# Patient Record
Sex: Female | Born: 1965 | Race: White | Hispanic: No | Marital: Single | Smoking: Former smoker
Health system: Southern US, Community
[De-identification: ages and names within clinical notes are randomized; demographics above are authoritative.]

## PROBLEM LIST (undated history)

## (undated) DIAGNOSIS — I4891 Unspecified atrial fibrillation: Secondary | ICD-10-CM

## (undated) DIAGNOSIS — J45909 Unspecified asthma, uncomplicated: Secondary | ICD-10-CM

## (undated) DIAGNOSIS — R600 Localized edema: Secondary | ICD-10-CM

## (undated) DIAGNOSIS — J42 Unspecified chronic bronchitis: Secondary | ICD-10-CM

## (undated) DIAGNOSIS — M069 Rheumatoid arthritis, unspecified: Secondary | ICD-10-CM

## (undated) DIAGNOSIS — I2510 Atherosclerotic heart disease of native coronary artery without angina pectoris: Secondary | ICD-10-CM

## (undated) DIAGNOSIS — J449 Chronic obstructive pulmonary disease, unspecified: Secondary | ICD-10-CM

## (undated) DIAGNOSIS — M1990 Unspecified osteoarthritis, unspecified site: Secondary | ICD-10-CM

## (undated) DIAGNOSIS — Z8719 Personal history of other diseases of the digestive system: Secondary | ICD-10-CM

## (undated) DIAGNOSIS — N83209 Unspecified ovarian cyst, unspecified side: Secondary | ICD-10-CM

## (undated) DIAGNOSIS — J189 Pneumonia, unspecified organism: Secondary | ICD-10-CM

## (undated) HISTORY — DX: Unspecified ovarian cyst, unspecified side: N83.209

## (undated) HISTORY — DX: Atherosclerotic heart disease of native coronary artery without angina pectoris: I25.10

## (undated) HISTORY — DX: Rheumatoid arthritis, unspecified: M06.9

## (undated) HISTORY — PX: TONSILLECTOMY: SUR1361

## (undated) HISTORY — PX: TUBAL LIGATION: SHX77

---

## 2001-05-29 ENCOUNTER — Emergency Department (HOSPITAL_COMMUNITY): Admission: EM | Admit: 2001-05-29 | Discharge: 2001-05-29 | Payer: Self-pay | Admitting: *Deleted

## 2001-08-02 ENCOUNTER — Emergency Department (HOSPITAL_COMMUNITY): Admission: EM | Admit: 2001-08-02 | Discharge: 2001-08-03 | Payer: Self-pay | Admitting: Emergency Medicine

## 2001-08-04 ENCOUNTER — Emergency Department (HOSPITAL_COMMUNITY): Admission: EM | Admit: 2001-08-04 | Discharge: 2001-08-04 | Payer: Self-pay | Admitting: *Deleted

## 2002-02-14 ENCOUNTER — Emergency Department (HOSPITAL_COMMUNITY): Admission: EM | Admit: 2002-02-14 | Discharge: 2002-02-14 | Payer: Self-pay | Admitting: Emergency Medicine

## 2002-02-14 ENCOUNTER — Encounter: Payer: Self-pay | Admitting: Emergency Medicine

## 2002-07-08 ENCOUNTER — Emergency Department (HOSPITAL_COMMUNITY): Admission: EM | Admit: 2002-07-08 | Discharge: 2002-07-08 | Payer: Self-pay | Admitting: Internal Medicine

## 2004-12-08 ENCOUNTER — Emergency Department (HOSPITAL_COMMUNITY): Admission: EM | Admit: 2004-12-08 | Discharge: 2004-12-08 | Payer: Self-pay | Admitting: Emergency Medicine

## 2004-12-10 ENCOUNTER — Ambulatory Visit (HOSPITAL_COMMUNITY): Admission: RE | Admit: 2004-12-10 | Discharge: 2004-12-10 | Payer: Self-pay | Admitting: Emergency Medicine

## 2010-01-18 ENCOUNTER — Emergency Department (HOSPITAL_COMMUNITY): Admission: EM | Admit: 2010-01-18 | Discharge: 2010-01-18 | Payer: Self-pay | Admitting: Emergency Medicine

## 2010-01-30 ENCOUNTER — Ambulatory Visit (HOSPITAL_COMMUNITY): Admission: RE | Admit: 2010-01-30 | Discharge: 2010-01-30 | Payer: Self-pay | Admitting: Family Medicine

## 2010-07-30 LAB — DIFFERENTIAL
Basophils Absolute: 0.1 K/uL (ref 0.0–0.1)
Basophils Relative: 1 % (ref 0–1)
Eosinophils Absolute: 0.2 10*3/uL (ref 0.0–0.7)
Eosinophils Relative: 2 % (ref 0–5)
Lymphocytes Relative: 31 % (ref 12–46)
Lymphs Abs: 4.6 K/uL — ABNORMAL HIGH (ref 0.7–4.0)
Monocytes Absolute: 1.1 10*3/uL — ABNORMAL HIGH (ref 0.1–1.0)
Monocytes Relative: 8 % (ref 3–12)
Neutro Abs: 8.7 K/uL — ABNORMAL HIGH (ref 1.7–7.7)
Neutrophils Relative %: 59 % (ref 43–77)

## 2010-07-30 LAB — CBC
HCT: 37.3 % (ref 36.0–46.0)
Hemoglobin: 12.3 g/dL (ref 12.0–15.0)
MCH: 27.9 pg (ref 26.0–34.0)
MCHC: 33 g/dL (ref 30.0–36.0)
MCV: 84.7 fL (ref 78.0–100.0)
Platelets: 302 K/uL (ref 150–400)
RBC: 4.41 MIL/uL (ref 3.87–5.11)
RDW: 13.9 % (ref 11.5–15.5)
WBC: 14.8 K/uL — ABNORMAL HIGH (ref 4.0–10.5)

## 2010-07-30 LAB — POCT CARDIAC MARKERS
CKMB, poc: <1 ng/mL — ABNORMAL LOW (ref 1.0–8.0)
Myoglobin, poc: 43.2 ng/mL (ref 12–200)
Troponin i, poc: <0.05 ng/mL (ref 0.00–0.09)

## 2010-07-30 LAB — BASIC METABOLIC PANEL
BUN: 8 mg/dL (ref 6–23)
CO2: 26 mEq/L (ref 19–32)
Calcium: 9.1 mg/dL (ref 8.4–10.5)
Chloride: 105 mEq/L (ref 96–112)
Creatinine, Ser: 0.67 mg/dL (ref 0.4–1.2)
GFR calc Af Amer: >60 mL/min (ref >60)
GFR calc non Af Amer: >60 mL/min (ref >60)
Glucose, Bld: 120 mg/dL — ABNORMAL HIGH (ref 70–99)
Potassium: 3.3 mEq/L — ABNORMAL LOW (ref 3.5–5.1)
Sodium: 138 mEq/L (ref 135–145)

## 2012-06-12 ENCOUNTER — Other Ambulatory Visit (HOSPITAL_COMMUNITY): Payer: Self-pay | Admitting: Family Medicine

## 2012-06-12 DIAGNOSIS — Z139 Encounter for screening, unspecified: Secondary | ICD-10-CM

## 2012-06-15 ENCOUNTER — Ambulatory Visit (HOSPITAL_COMMUNITY): Payer: Self-pay

## 2012-06-26 ENCOUNTER — Ambulatory Visit (HOSPITAL_COMMUNITY)
Admission: RE | Admit: 2012-06-26 | Discharge: 2012-06-26 | Disposition: A | Discharge status: Home / Self Care | Payer: BC Managed Care – PPO | Source: Ambulatory Visit | Attending: Family Medicine | Admitting: Family Medicine

## 2012-06-26 DIAGNOSIS — Z1231 Encounter for screening mammogram for malignant neoplasm of breast: Secondary | ICD-10-CM | POA: Insufficient documentation

## 2012-06-26 DIAGNOSIS — Z139 Encounter for screening, unspecified: Secondary | ICD-10-CM

## 2013-03-28 ENCOUNTER — Emergency Department (HOSPITAL_COMMUNITY)
Admission: EM | Admit: 2013-03-28 | Discharge: 2013-03-29 | Disposition: A | Discharge status: Home / Self Care | Payer: BC Managed Care – PPO | Attending: Emergency Medicine | Admitting: Emergency Medicine

## 2013-03-28 ENCOUNTER — Encounter (HOSPITAL_COMMUNITY): Payer: Self-pay | Admitting: Emergency Medicine

## 2013-03-28 ENCOUNTER — Emergency Department (HOSPITAL_COMMUNITY): Payer: BC Managed Care – PPO

## 2013-03-28 DIAGNOSIS — F17200 Nicotine dependence, unspecified, uncomplicated: Secondary | ICD-10-CM | POA: Insufficient documentation

## 2013-03-28 DIAGNOSIS — M25569 Pain in unspecified knee: Secondary | ICD-10-CM | POA: Insufficient documentation

## 2013-03-28 DIAGNOSIS — M25561 Pain in right knee: Secondary | ICD-10-CM

## 2013-03-28 DIAGNOSIS — Z872 Personal history of diseases of the skin and subcutaneous tissue: Secondary | ICD-10-CM | POA: Insufficient documentation

## 2013-03-28 DIAGNOSIS — Z79899 Other long term (current) drug therapy: Secondary | ICD-10-CM | POA: Insufficient documentation

## 2013-03-28 HISTORY — DX: Localized edema: R60.0

## 2013-03-28 MED ORDER — NAPROXEN 250 MG PO TABS
500.0000 mg | ORAL_TABLET | Freq: Once | ORAL | Status: AC
  Administered 2013-03-28: 500 mg via ORAL
  Filled 2013-03-28: qty 2

## 2013-03-28 MED ORDER — OXYCODONE-ACETAMINOPHEN 5-325 MG PO TABS
2.0000 | ORAL_TABLET | Freq: Once | ORAL | Status: AC
  Administered 2013-03-28: 2 via ORAL
  Filled 2013-03-28: qty 2

## 2013-03-28 NOTE — ED Provider Notes (Addendum)
CSN: [id number]     Arrival date & time 03/28/13  2327 History  This chart was scribed for Vida Roller, MD by Blanchard Kelch, ED Scribe. The patient was seen in room APA02/APA02. Patient's care was started at 11:35 PM.    Chief Complaint  Patient presents with  . Knee Pain   The history is provided by the patient. No language interpreter was used.    HPI Comments: Kristine Montgomery is a 47 y.o. female who presents to the Emergency Department complaining of constant right knee pain that began a few days ago, but worsened today. She states that she was walking at work three days ago and heard a "pop" in her knee but denies any immediate pain to the area at that time. Since then she has experienced a popcorn like sensation with ambulation. She describes the pain as a "pulling sensation." The pain is worsened with ambulation. She has taken Aleve for the pain without relief. She denies any ankle, foot, shoulder, elbow, wrist, neck, left knee pain or any other arthragias. She also reports baseline leg swelling, worse on the right. She has never had it checked to determine the cause of the swelling. She is currently taking Lasix for it. She has had prior pain in her right leg about a month ago, but states this pain is different from the pain she is currently experiencing. She was seen by Dr. Regino Schultze for it who told her it may be due to irritation of her sciatic nerve.    Past Medical History  Diagnosis Date  . Edema extremities    History reviewed. No pertinent past surgical history. No family history on file. History  Substance Use Topics  . Smoking status: Current Every Day Smoker  . Smokeless tobacco: Not on file  . Alcohol Use: No   OB History   Grav Para Term Preterm Abortions TAB SAB Ect Mult Living                 Review of Systems  All other systems reviewed and are negative.    Allergies  Review of patient's allergies indicates no known allergies.  Home Medications    Current Outpatient Rx  Name  Route  Sig  Dispense  Refill  . furosemide (LASIX) 20 MG tablet   Oral   Take 20 mg by mouth 2 (two) times daily as needed.         . naproxen (NAPROSYN) 500 MG tablet   Oral   Take 1 tablet (500 mg total) by mouth 2 (two) times daily with a meal.   30 tablet   0   . oxyCODONE-acetaminophen (PERCOCET) 5-325 MG per tablet   Oral   Take 1 tablet by mouth every 4 (four) hours as needed.   20 tablet   0    Triage Vitals: BP 169/84  Pulse 91  Temp(Src) 98.3 F (36.8 C) (Oral)  Resp 22  Ht 5\' 1"  (1.549 m)  Wt 294 lb (133.358 kg)  BMI 55.58 kg/m2  SpO2 100%  LMP 03/08/2013  Physical Exam  Nursing note and vitals reviewed. Constitutional: She is oriented to person, place, and time. She appears well-developed and well-nourished. No distress.  HENT:  Head: Normocephalic and atraumatic.  Eyes: EOM are normal.  Neck: Neck supple.  Cardiovascular: Normal rate and regular rhythm.   No murmur heard. Pulmonary/Chest: Effort normal and breath sounds normal. No respiratory distress.  Musculoskeletal: Normal range of motion. She exhibits edema and tenderness.  Focal tenderness to right knee medial joint line. No crepitance. Significant decreased range of motion secondary to pain. No redness warmth or rash on right knee. Edema and swelling of right leg compared to left below the knee.  Neurological: She is alert and oriented to person, place, and time.  Skin: Skin is warm and dry.  Psychiatric: She has a normal mood and affect. Her behavior is normal.    ED Course  Procedures (including critical care time)  DIAGNOSTIC STUDIES: Oxygen Saturation is 100% on room air, normal by my interpretation.    COORDINATION OF CARE: 11:42 PM -Will order right knee x-ray, Percocet and Naprosyn. Patient verbalizes understanding and agrees with treatment plan.    Labs Review Labs Reviewed - No data to display Imaging Review No results found.  EKG  Interpretation   None       MDM   1. Right knee pain    Pt has no effusion on xray or on clinical exam, there is no warmth or redness to joint to suggest that it is a septic arthritis and the pain is medial joint line tenderness - this would be most c/w a torn meniscus since she has a popping sensation when she walks and bends her knee - RICE therapy, knee imob and pain meds with ortho f/u.  Scheduled for [place] in the AM due to asymetric swelling of the LE's  Meds given in ED:  Medications  oxyCODONE-acetaminophen (PERCOCET/ROXICET) 5-325 MG per tablet 2 tablet (2 tablets Oral Given 03/28/13 2354)  naproxen (NAPROSYN) tablet 500 mg (500 mg Oral Given 03/28/13 2354)    New Prescriptions   NAPROXEN (NAPROSYN) 500 MG TABLET    Take 1 tablet (500 mg total) by mouth 2 (two) times daily with a meal.   OXYCODONE-ACETAMINOPHEN (PERCOCET) 5-325 MG PER TABLET    Take 1 tablet by mouth every 4 (four) hours as needed.      I personally performed the services described in this documentation, which was scribed in my presence. The recorded information has been reviewed and is accurate.      Vida Roller, MD 03/29/13 0021  Vida Roller, MD 03/29/13 [phone]

## 2013-03-28 NOTE — ED Notes (Signed)
Worsening pain over past couple of days, was on her feet all day today and pain 10/10 now

## 2013-03-29 ENCOUNTER — Other Ambulatory Visit (HOSPITAL_COMMUNITY): Payer: BC Managed Care – PPO | Admitting: Emergency Medicine

## 2013-03-29 ENCOUNTER — Ambulatory Visit (HOSPITAL_COMMUNITY)
Admit: 2013-03-29 | Discharge: 2013-03-29 | Disposition: A | Discharge status: Home / Self Care | Payer: BC Managed Care – PPO | Attending: Emergency Medicine | Admitting: Emergency Medicine

## 2013-03-29 DIAGNOSIS — M79604 Pain in right leg: Secondary | ICD-10-CM

## 2013-03-29 DIAGNOSIS — M79609 Pain in unspecified limb: Secondary | ICD-10-CM | POA: Insufficient documentation

## 2013-03-29 MED ORDER — NAPROXEN 500 MG PO TABS: 500.0000 mg | ORAL_TABLET | Freq: Two times a day (BID) | ORAL | Status: DC

## 2013-03-29 MED ORDER — OXYCODONE-ACETAMINOPHEN 5-325 MG PO TABS
ORAL_TABLET | ORAL | Status: AC
  Filled 2013-03-29: qty 1

## 2013-03-29 MED ORDER — OXYCODONE-ACETAMINOPHEN 5-325 MG PO TABS: 1.0000 | ORAL_TABLET | ORAL | Status: DC | PRN

## 2013-03-29 NOTE — ED Notes (Signed)
Weight  296.8 lbs   Weight limit for crutches is 300 lbs

## 2013-03-29 NOTE — ED Notes (Signed)
Ultrasound scheduled for 03/29/2013  At 15:00   Patient advised to return at 14:45 to check in for ultrasound

## 2013-05-10 ENCOUNTER — Encounter (HOSPITAL_COMMUNITY): Payer: Self-pay | Admitting: Emergency Medicine

## 2013-05-10 ENCOUNTER — Emergency Department (HOSPITAL_COMMUNITY)
Admission: EM | Admit: 2013-05-10 | Discharge: 2013-05-10 | Disposition: A | Discharge status: Home / Self Care | Payer: BC Managed Care – PPO | Attending: Emergency Medicine | Admitting: Emergency Medicine

## 2013-05-10 DIAGNOSIS — J40 Bronchitis, not specified as acute or chronic: Secondary | ICD-10-CM

## 2013-05-10 DIAGNOSIS — J209 Acute bronchitis, unspecified: Secondary | ICD-10-CM | POA: Insufficient documentation

## 2013-05-10 DIAGNOSIS — F17200 Nicotine dependence, unspecified, uncomplicated: Secondary | ICD-10-CM | POA: Insufficient documentation

## 2013-05-10 DIAGNOSIS — J029 Acute pharyngitis, unspecified: Secondary | ICD-10-CM | POA: Insufficient documentation

## 2013-05-10 DIAGNOSIS — Z791 Long term (current) use of non-steroidal anti-inflammatories (NSAID): Secondary | ICD-10-CM | POA: Insufficient documentation

## 2013-05-10 DIAGNOSIS — IMO0001 Reserved for inherently not codable concepts without codable children: Secondary | ICD-10-CM | POA: Insufficient documentation

## 2013-05-10 MED ORDER — ALBUTEROL SULFATE HFA 108 (90 BASE) MCG/ACT IN AERS
2.0000 | INHALATION_SPRAY | RESPIRATORY_TRACT | Status: DC | PRN
  Administered 2013-05-10: 2 via RESPIRATORY_TRACT

## 2013-05-10 MED ORDER — GUAIFENESIN-CODEINE 100-10 MG/5ML PO SYRP: 5.0000 mL | ORAL_SOLUTION | Freq: Three times a day (TID) | ORAL | Status: DC | PRN

## 2013-05-10 MED ORDER — HYDROCOD POLST-CHLORPHEN POLST 10-8 MG/5ML PO LQCR
5.0000 mL | Freq: Once | ORAL | Status: AC
  Administered 2013-05-10: 5 mL via ORAL
  Filled 2013-05-10: qty 5

## 2013-05-10 MED ORDER — AZITHROMYCIN 250 MG PO TABS: ORAL_TABLET | ORAL | Status: DC

## 2013-05-10 MED ORDER — ALBUTEROL SULFATE HFA 108 (90 BASE) MCG/ACT IN AERS
INHALATION_SPRAY | RESPIRATORY_TRACT | Status: AC
  Filled 2013-05-10: qty 6.7

## 2013-05-10 MED ORDER — AZITHROMYCIN 250 MG PO TABS
500.0000 mg | ORAL_TABLET | Freq: Once | ORAL | Status: AC
  Administered 2013-05-10: 500 mg via ORAL
  Filled 2013-05-10: qty 2

## 2013-05-10 NOTE — ED Provider Notes (Signed)
CSN: [id number]     Arrival date & time 05/10/13  1650 History   First MD Initiated Contact with Patient 05/10/13 1756     Chief Complaint  Patient presents with  . Cough   (Consider location/radiation/quality/duration/timing/severity/associated sxs/prior Treatment) Patient is a 47 y.o. female presenting with cough. The history is provided by the patient.  Cough Cough characteristics:  Productive Sputum characteristics:  Clear Severity:  Moderate Onset quality:  Gradual Duration:  2 days Timing:  Sporadic Progression:  Worsening Chronicity:  New Smoker: yes   Context: upper respiratory infection   Relieved by:  Nothing Worsened by:  Lying down and smoking Ineffective treatments:  Cough suppressants and decongestant Associated symptoms: myalgias, rhinorrhea, shortness of breath, sinus congestion, sore throat and wheezing   Associated symptoms: no chest pain, no chills, no ear fullness, no ear pain, no eye discharge, no fever, no headaches and no rash    NIKKOLE PLACZEK is a 47 y.o. female who presents to the ED with cough and congestion, wheezing and URI symptoms that started 2 days ago. She has been taking OTC medications without relief. She is an every day smoker.   Past Medical History  Diagnosis Date  . Edema extremities    Past Surgical History  Procedure Laterality Date  . Cesarean section    . Tonsillectomy     History reviewed. No pertinent family history. History  Substance Use Topics  . Smoking status: Current Every Day Smoker  . Smokeless tobacco: Not on file  . Alcohol Use: No   OB History   Grav Para Term Preterm Abortions TAB SAB Ect Mult Living                 Review of Systems  Constitutional: Negative for fever and chills.  HENT: Positive for rhinorrhea and sore throat. Negative for ear pain.   Eyes: Negative for discharge.  Respiratory: Positive for cough, shortness of breath and wheezing.   Cardiovascular: Negative for chest pain.   Gastrointestinal: Negative for nausea, vomiting and abdominal pain.  Musculoskeletal: Positive for myalgias.  Skin: Negative for rash.  Neurological: Negative for headaches.    Allergies  Review of patient's allergies indicates no known allergies.  Home Medications   Current Outpatient Rx  Name  Route  Sig  Dispense  Refill  . furosemide (LASIX) 20 MG tablet   Oral   Take 20 mg by mouth 2 (two) times daily as needed.         . naproxen (NAPROSYN) 500 MG tablet   Oral   Take 1 tablet (500 mg total) by mouth 2 (two) times daily with a meal.   30 tablet   0   . oxyCODONE-acetaminophen (PERCOCET) 5-325 MG per tablet   Oral   Take 1 tablet by mouth every 4 (four) hours as needed.   20 tablet   0    BP 147/74  Pulse 92  Temp(Src) 97.9 F (36.6 C) (Oral)  Resp 20  Ht 5\' 1"  (1.549 m)  Wt 294 lb (133.358 kg)  BMI 55.58 kg/m2  SpO2 97%  LMP 04/30/2013 Physical Exam  Nursing note and vitals reviewed. Constitutional: She is oriented to person, place, and time. She appears well-developed and well-nourished.  HENT:  Head: Normocephalic and atraumatic.  Right Ear: Tympanic membrane normal.  Left Ear: Tympanic membrane normal.  Nose: Mucosal edema and rhinorrhea present.  Mouth/Throat: Uvula is midline and mucous membranes are normal. Posterior oropharyngeal erythema present.  Eyes: EOM are normal.  Neck: Neck supple.  Cardiovascular: Normal rate and regular rhythm.   Pulmonary/Chest: Effort normal. She has wheezes.  Abdominal: Soft. Bowel sounds are normal. There is no tenderness.  Musculoskeletal: Normal range of motion.  Neurological: She is alert and oriented to person, place, and time. No cranial nerve deficit.  Skin: Skin is warm and dry.  Psychiatric: She has a normal mood and affect. Her behavior is normal.    ED Course  Procedures   MDM  47 y.o. female with bronchitis and bronchospasm. Will treat with antibiotics and cough medication and Albuterol inhaler.  Encouraged patient to stop smoking. Discussed with the patient clinical findings and plan of care. All questioned fully answered. She voices understanding. She will return if any problems arise. Stable for discharge. BP 147/74  Pulse 92  Temp(Src) 97.9 F (36.6 C) (Oral)  Resp 20  Ht 5\' 1"  (1.549 m)  Wt 294 lb (133.358 kg)  BMI 55.58 kg/m2  SpO2 97%  LMP 04/30/2013  Will give first dose of antibiotics and cough medication here in the ED.    Medication List    TAKE these medications       azithromycin 250 MG tablet  Commonly known as:  ZITHROMAX  Take one tablet PO every day until finished.     guaiFENesin-codeine 100-10 MG/5ML syrup  Commonly known as:  ROBITUSSIN AC  Take 5 mLs by mouth 3 (three) times daily as needed for cough.      ASK your doctor about these medications       furosemide 20 MG tablet  Commonly known as:  LASIX  Take 20 mg by mouth 2 (two) times daily as needed.     naproxen 500 MG tablet  Commonly known as:  NAPROSYN  Take 1 tablet (500 mg total) by mouth 2 (two) times daily with a meal.     oxyCODONE-acetaminophen 5-325 MG per tablet  Commonly known as:  PERCOCET  Take 1 tablet by mouth every 4 (four) hours as needed.         [facility] Orlene Och, [place] 05/10/13 [phone]

## 2013-05-10 NOTE — ED Notes (Signed)
Cough, nasal congestion for 2 days,  Sore throat

## 2013-05-10 NOTE — ED Provider Notes (Signed)
Medical screening examination/treatment/procedure(s) were performed by non-physician practitioner and as supervising physician I was immediately available for consultation/collaboration.  EKG Interpretation   None        Donnetta Hutching, MD 05/10/13 [phone]

## 2013-06-13 ENCOUNTER — Encounter (HOSPITAL_COMMUNITY): Payer: Self-pay | Admitting: Emergency Medicine

## 2013-06-13 ENCOUNTER — Emergency Department (HOSPITAL_COMMUNITY)
Admission: EM | Admit: 2013-06-13 | Discharge: 2013-06-13 | Disposition: A | Discharge status: Home / Self Care | Payer: 59 | Attending: Emergency Medicine | Admitting: Emergency Medicine

## 2013-06-13 DIAGNOSIS — R609 Edema, unspecified: Secondary | ICD-10-CM | POA: Insufficient documentation

## 2013-06-13 DIAGNOSIS — J020 Streptococcal pharyngitis: Secondary | ICD-10-CM | POA: Insufficient documentation

## 2013-06-13 DIAGNOSIS — F17200 Nicotine dependence, unspecified, uncomplicated: Secondary | ICD-10-CM | POA: Insufficient documentation

## 2013-06-13 DIAGNOSIS — Z791 Long term (current) use of non-steroidal anti-inflammatories (NSAID): Secondary | ICD-10-CM | POA: Insufficient documentation

## 2013-06-13 MED ORDER — CETIRIZINE HCL 10 MG PO TABS: 10.0000 mg | ORAL_TABLET | Freq: Every day | ORAL | Status: DC

## 2013-06-13 MED ORDER — AMOXICILLIN 500 MG PO CAPS: 500.0000 mg | ORAL_CAPSULE | Freq: Three times a day (TID) | ORAL | Status: DC

## 2013-06-13 MED ORDER — OXYMETAZOLINE HCL 0.05 % NA SOLN
1.0000 | Freq: Once | NASAL | Status: AC
  Administered 2013-06-13: 1 via NASAL
  Filled 2013-06-13: qty 15

## 2013-06-13 MED ORDER — AMOXICILLIN 250 MG PO CAPS
1000.0000 mg | ORAL_CAPSULE | Freq: Once | ORAL | Status: AC
  Administered 2013-06-13: 1000 mg via ORAL
  Filled 2013-06-13: qty 4

## 2013-06-13 NOTE — ED Notes (Signed)
Pt c/o nasal congestion and sore throat since last night.

## 2013-06-13 NOTE — ED Provider Notes (Signed)
CSN: [id number]     Arrival date & time 06/13/13  0550 History   First MD Initiated Contact with Patient 06/13/13 [phone]     Chief Complaint  Patient presents with  . Sore Throat  . Nasal Congestion   (Consider location/radiation/quality/duration/timing/severity/associated sxs/prior Treatment) HPI Comments: 48 year old morbidly obese female presents with a complaint of sore throat and nasal congestion. This has been present for approximately 24 hours, gradually worsening, no associated chest pain, fever, chills, nausea, vomiting. She does have associated myalgias of her upper and lower extremities. She has sick contacts with 2 people in the family who have had strep throat within the last week.  Patient is a 48 y.o. female presenting with pharyngitis. The history is provided by the patient.  Sore Throat    Past Medical History  Diagnosis Date  . Edema extremities    Past Surgical History  Procedure Laterality Date  . Cesarean section    . Tonsillectomy     History reviewed. No pertinent family history. History  Substance Use Topics  . Smoking status: Current Every Day Smoker  . Smokeless tobacco: Not on file  . Alcohol Use: No   OB History   Grav Para Term Preterm Abortions TAB SAB Ect Mult Living                 Review of Systems  All other systems reviewed and are negative.    Allergies  Review of patient's allergies indicates no known allergies.  Home Medications   Current Outpatient Rx  Name  Route  Sig  Dispense  Refill  . furosemide (LASIX) 20 MG tablet   Oral   Take 20 mg by mouth 2 (two) times daily as needed.         Marland Kitchen amoxicillin (AMOXIL) 500 MG capsule   Oral   Take 1 capsule (500 mg total) by mouth 3 (three) times daily.   60 capsule   0   . azithromycin (ZITHROMAX) 250 MG tablet      Take one tablet PO every day until finished.   4 tablet   0   . cetirizine (ZYRTEC ALLERGY) 10 MG tablet   Oral   Take 1 tablet (10 mg total) by mouth  daily.   30 tablet   1   . guaiFENesin-codeine (ROBITUSSIN AC) 100-10 MG/5ML syrup   Oral   Take 5 mLs by mouth 3 (three) times daily as needed for cough.   120 mL   0   . naproxen (NAPROSYN) 500 MG tablet   Oral   Take 1 tablet (500 mg total) by mouth 2 (two) times daily with a meal.   30 tablet   0   . oxyCODONE-acetaminophen (PERCOCET) 5-325 MG per tablet   Oral   Take 1 tablet by mouth every 4 (four) hours as needed.   20 tablet   0    LMP 05/28/2013 Physical Exam  Nursing note and vitals reviewed. Constitutional: She appears well-developed and well-nourished. No distress.  HENT:  Head: Normocephalic and atraumatic.  Nasal turbinates with swelling, there is purulent material in the bilateral nares, oropharynx is erythematous with copious amounts of purulent sputum swirling around the back of her throat and into her oral cavity  Eyes: Conjunctivae and EOM are normal. Pupils are equal, round, and reactive to light. Right eye exhibits no discharge. Left eye exhibits no discharge. No scleral icterus.  Neck: Normal range of motion. Neck supple. No JVD present. No thyromegaly present.  Cardiovascular:  Normal rate, regular rhythm, normal heart sounds and intact distal pulses.  Exam reveals no gallop and no friction rub.   No murmur heard. Pulmonary/Chest: Effort normal and breath sounds normal. No respiratory distress. She has no wheezes. She has no rales.  Musculoskeletal: Normal range of motion. She exhibits edema (bilateral symmetrical lower extremity swelling).  Lymphadenopathy:    She has no cervical adenopathy.  Neurological: She is alert. Coordination normal.  Skin: Skin is warm and dry. No rash noted. No erythema.  Psychiatric: She has a normal mood and affect. Her behavior is normal.    ED Course  Procedures (including critical care time) Labs Review Labs Reviewed - No data to display Imaging Review No results found.  EKG Interpretation   None       MDM    1. Strep throat    Exam consistent with upper respiratory infection, possibly strep pharyngitis, amoxicillin 1000 mg 3 times a day for 10 days, Afrin nasal spray for the significant congestion and Zyrtec each bedtime.  The patient is amenable to discharge with the above treatment plan   Meds given in ED:  Medications  oxymetazoline (AFRIN) 0.05 % nasal spray 1 spray (not administered)  amoxicillin (AMOXIL) capsule 1,000 mg (not administered)    New Prescriptions   AMOXICILLIN (AMOXIL) 500 MG CAPSULE    Take 1 capsule (500 mg total) by mouth 3 (three) times daily.   CETIRIZINE (ZYRTEC ALLERGY) 10 MG TABLET    Take 1 tablet (10 mg total) by mouth daily.        Vida RollerBrian D Sharron Simpson, MD 06/13/13 [phone]

## 2013-06-13 NOTE — Discharge Instructions (Signed)
Amoxicilling for 10 days, see your doctor if no improvement in 3 days  Tylenol or motrin for fevers / pain

## 2013-07-26 ENCOUNTER — Other Ambulatory Visit (HOSPITAL_COMMUNITY): Payer: Self-pay | Admitting: Family Medicine

## 2013-07-26 DIAGNOSIS — Z1231 Encounter for screening mammogram for malignant neoplasm of breast: Secondary | ICD-10-CM

## 2013-08-20 ENCOUNTER — Ambulatory Visit (HOSPITAL_COMMUNITY)
Admission: RE | Admit: 2013-08-20 | Discharge: 2013-08-20 | Disposition: A | Discharge status: Home / Self Care | Payer: 59 | Source: Ambulatory Visit | Attending: Family Medicine | Admitting: Family Medicine

## 2013-08-20 DIAGNOSIS — Z1231 Encounter for screening mammogram for malignant neoplasm of breast: Secondary | ICD-10-CM | POA: Insufficient documentation

## 2014-08-21 ENCOUNTER — Other Ambulatory Visit (HOSPITAL_COMMUNITY): Payer: Self-pay | Admitting: Family Medicine

## 2014-08-21 DIAGNOSIS — Z1231 Encounter for screening mammogram for malignant neoplasm of breast: Secondary | ICD-10-CM

## 2014-09-25 ENCOUNTER — Ambulatory Visit (HOSPITAL_COMMUNITY): Payer: 59

## 2015-03-04 ENCOUNTER — Encounter (HOSPITAL_COMMUNITY): Payer: Self-pay | Admitting: Emergency Medicine

## 2015-03-04 ENCOUNTER — Emergency Department (HOSPITAL_COMMUNITY)
Admission: EM | Admit: 2015-03-04 | Discharge: 2015-03-05 | Disposition: A | Discharge status: Home / Self Care | Payer: 59 | Attending: Emergency Medicine | Admitting: Emergency Medicine

## 2015-03-04 DIAGNOSIS — Z720 Tobacco use: Secondary | ICD-10-CM | POA: Insufficient documentation

## 2015-03-04 DIAGNOSIS — R059 Cough, unspecified: Secondary | ICD-10-CM

## 2015-03-04 DIAGNOSIS — J181 Lobar pneumonia, unspecified organism: Secondary | ICD-10-CM

## 2015-03-04 DIAGNOSIS — R05 Cough: Secondary | ICD-10-CM

## 2015-03-04 DIAGNOSIS — J189 Pneumonia, unspecified organism: Secondary | ICD-10-CM | POA: Insufficient documentation

## 2015-03-04 NOTE — ED Provider Notes (Signed)
CSN: [id number]     Arrival date & time 03/04/15  2344 History   First MD Initiated Contact with Patient 03/04/15 2348     Chief Complaint  Patient presents with  . Cough     (Consider location/radiation/quality/duration/timing/severity/associated sxs/prior Treatment) Patient is a 49 y.o. female presenting with cough. The history is provided by the patient.  Cough Cough characteristics:  Productive Sputum characteristics:  Yellow Severity:  Moderate Onset quality:  Gradual Duration:  1 week Timing:  Intermittent Progression:  Waxing and waning Smoker: yes   Relieved by:  Nothing Worsened by:  Deep breathing and lying down Ineffective treatments:  Decongestant and cough suppressants Associated symptoms: rhinorrhea, sinus congestion and wheezing   Associated symptoms: no chest pain, no chills, no ear pain, no eye discharge, no fever, no headaches, no myalgias, no rash and no sore throat    Kristine Montgomery is a 49 y.o. female who presents to the ED with a cough.  Past Medical History  Diagnosis Date  . Edema extremities    Past Surgical History  Procedure Laterality Date  . Cesarean section    . Tonsillectomy     History reviewed. No pertinent family history. Social History  Substance Use Topics  . Smoking status: Current Every Day Smoker  . Smokeless tobacco: None  . Alcohol Use: No   OB History    No data available     Review of Systems  Constitutional: Negative for fever and chills.  HENT: Positive for congestion, rhinorrhea and sinus pressure. Negative for ear pain and sore throat.   Eyes: Negative for pain, discharge, redness and visual disturbance.  Respiratory: Positive for cough and wheezing.   Cardiovascular: Negative for chest pain.  Gastrointestinal: Negative for nausea, vomiting, abdominal pain and diarrhea.  Genitourinary: Negative for dysuria, urgency, frequency and pelvic pain.  Musculoskeletal: Negative for myalgias and back pain.       Right rib  pain that is worse with cough  Skin: Negative for rash.  Neurological: Negative for syncope, light-headedness and headaches.  Psychiatric/Behavioral: Negative for confusion. The patient is not nervous/anxious.       Allergies  Review of patient's allergies indicates no known allergies.  Home Medications   Prior to Admission medications   Medication Sig Start Date End Date Taking? Authorizing Provider  azithromycin (ZITHROMAX) 250 MG tablet Take 1 tablet (250 mg total) by mouth daily. Take first 2 tablets together, then 1 every day until finished. 03/05/15   Hope Orlene Och, NP  chlorpheniramine-HYDROcodone (TUSSIONEX PENNKINETIC ER) 10-8 MG/5ML SUER Take 5 mLs by mouth every 12 (twelve) hours as needed for cough. 03/05/15   Hope Orlene Och, NP   BP 144/66 mmHg  Pulse 81  Temp(Src) 97.4 F (36.3 C)  Resp 22  Ht  (1.549 m)  Wt 290 lb (131.543 kg)  BMI 54.82 kg/m2  SpO2 93%  LMP 02/28/2015 Physical Exam  Constitutional: She is oriented to person, place, and time. She appears well-developed and well-nourished. No distress.  HENT:  Head: Normocephalic and atraumatic.  Eyes: EOM are normal.  Neck: Neck supple.  Cardiovascular: Normal rate and regular rhythm.   Pulmonary/Chest: Effort normal. No respiratory distress. She has decreased breath sounds in the right lower field. Wheezes: occasional. She has rhonchi in the right lower field.    Tender right posterior ribs.  Abdominal: Soft. There is no tenderness.  Musculoskeletal: Normal range of motion.  Neurological: She is alert and oriented to person, place, and time. No  cranial nerve deficit.  Skin: Skin is warm and dry.  Psychiatric: She has a normal mood and affect. Her behavior is normal.  Nursing note and vitals reviewed.   ED Course  Procedures (including critical care time) Labs Review Labs Reviewed - No data to display  Imaging Review Dg Chest 2 View  03/05/2015  CLINICAL DATA:  Cough for 1 week. Sudden onset of  lateral right rib pain this morning. EXAM: CHEST  2 VIEW COMPARISON:  01/18/2010 FINDINGS: Patchy right lower lung zone opacity concerning for pneumonia. Minimal linear atelectasis at the left lung base. Cardiomediastinal contours are unchanged. No pulmonary edema, pleural effusion, or pneumothorax. No acute osseous abnormalities are seen, there is degenerative change in the spine. IMPRESSION: Patchy right lower lung zone opacity concerning for pneumonia. On the lateral view this appears to localize to the lower and middle lobes. Followup PA and lateral chest X-ray is recommended in 3-4 weeks following trial of antibiotic therapy to ensure resolution and exclude underlying malignancy. Electronically Signed   By: Rubye OaksMelanie  Ehinger M.D.   On: 03/05/2015 00:57    MDM  49 y.o. female with cough and congestion x 2 weeks and right rib pain that started today with coughing. Stable for d/c without respiratory distress O2 SAT 93% on R/A. Encouraged patient to stop smoking. Discussed with the patient clinical and x-ray findings and plan of care. All questioned fully answered. She will follow up with her PCP or return here if any problems arise. Will treat with Z-Pak, Tussionex, albuterol inhaler.   Final diagnoses:  Cough  Right lower lobe pneumonia       Janne NapoleonHope M Neese, NP 03/05/15 0127  Devoria AlbeIva Knapp, MD 03/05/15 [phone]

## 2015-03-04 NOTE — ED Notes (Signed)
Pt c/o rt rib pain and cough that started this am.

## 2015-03-05 ENCOUNTER — Emergency Department (HOSPITAL_COMMUNITY): Payer: 59

## 2015-03-05 MED ORDER — AZITHROMYCIN 250 MG PO TABS
500.0000 mg | ORAL_TABLET | Freq: Once | ORAL | Status: AC
  Administered 2015-03-05: 500 mg via ORAL
  Filled 2015-03-05: qty 2

## 2015-03-05 MED ORDER — HYDROCOD POLST-CPM POLST ER 10-8 MG/5ML PO SUER: 5.0000 mL | Freq: Two times a day (BID) | ORAL | Status: DC | PRN

## 2015-03-05 MED ORDER — ALBUTEROL SULFATE HFA 108 (90 BASE) MCG/ACT IN AERS
2.0000 | INHALATION_SPRAY | RESPIRATORY_TRACT | Status: DC | PRN
  Administered 2015-03-05: 2 via RESPIRATORY_TRACT
  Filled 2015-03-05: qty 6.7

## 2015-03-05 MED ORDER — HYDROCOD POLST-CPM POLST ER 10-8 MG/5ML PO SUER
5.0000 mL | Freq: Once | ORAL | Status: AC
  Administered 2015-03-05: 5 mL via ORAL
  Filled 2015-03-05: qty 5

## 2015-03-05 MED ORDER — AZITHROMYCIN 250 MG PO TABS: 250.0000 mg | ORAL_TABLET | Freq: Every day | ORAL | Status: DC

## 2015-03-05 NOTE — ED Notes (Signed)
Pt alert & oriented x4, stable gait. Patient given discharge instructions, paperwork & prescription(s). Patient  instructed to stop at the registration desk to finish any additional paperwork. Patient verbalized understanding. Pt left department w/ no further questions. 

## 2015-03-05 NOTE — Discharge Instructions (Signed)
Call your doctor to schedule a follow up appointment.   Community-Acquired Pneumonia, Adult Pneumonia is an infection of the lungs. One type of pneumonia can happen while a person is in a hospital. A different type can happen when a person is not in a hospital (community-acquired pneumonia). It is easy for this kind to spread from person to person. It can spread to you if you breathe near an infected person who coughs or sneezes. Some symptoms include:  A dry cough.  A wet (productive) cough.  Fever.  Sweating.  Chest pain. HOME CARE  Take over-the-counter and prescription medicines only as told by your doctor.  Only take cough medicine if you are losing sleep.  If you were prescribed an antibiotic medicine, take it as told by your doctor. Do not stop taking the antibiotic even if you start to feel better.  Sleep with your head and neck raised (elevated). You can do this by putting a few pillows under your head, or you can sleep in a recliner.  Do not use tobacco products. These include cigarettes, chewing tobacco, and e-cigarettes. If you need help quitting, ask your doctor.  Drink enough water to keep your pee (urine) clear or pale yellow. A shot (vaccine) can help prevent pneumonia. Shots are often suggested for:  People older than 50 years of age.  People older than 50 years of age:  Who are having cancer treatment.  Who have long-term (chronic) lung disease.  Who have problems with their body's defense system (immune system). You may also prevent pneumonia if you take these actions:  Get the flu (influenza) shot every year.  Go to the dentist as often as told.  Wash your hands often. If soap and water are not available, use hand sanitizer. GET HELP IF:  You have a fever.  You lose sleep because your cough medicine does not help. GET HELP RIGHT AWAY IF:  You are short of breath and it gets worse.  You have more chest pain.  Your sickness gets worse. This is  very serious if:  You are an older adult.  Your body's defense system is weak.  You cough up blood.   This information is not intended to replace advice given to you by your health care provider. Make sure you discuss any questions you have with your health care provider.   Document Released: 10/20/2007 Document Revised: 01/22/2015 Document Reviewed: 08/28/2014 Elsevier Interactive Patient Education 2016 [employer]  [employer] Mist Vaporizers Vaporizers may help relieve the symptoms of a cough and cold. They add moisture to the air, which helps mucus to become thinner and less sticky. This makes it easier to breathe and cough up secretions. Cool mist vaporizers do not cause serious burns like hot mist vaporizers, which may also be called steamers or humidifiers. Vaporizers have not been proven to help with colds. You should not use a vaporizer if you are allergic to mold. HOME CARE INSTRUCTIONS  Follow the package instructions for the vaporizer.  Do not use anything other than distilled water in the vaporizer.  Do not run the vaporizer all of the time. This can cause mold or bacteria to grow in the vaporizer.  Clean the vaporizer after each time it is used.  Clean and dry the vaporizer well before storing it.  Stop using the vaporizer if worsening respiratory symptoms develop.   This information is not intended to replace advice given to you by your health care provider. Make sure you discuss any  questions you have with your health care provider.   Document Released: 01/29/2004 Document Revised: 05/08/2013 Document Reviewed: 09/20/2012 Elsevier Interactive Patient Education [employer]

## 2015-06-10 ENCOUNTER — Other Ambulatory Visit (HOSPITAL_COMMUNITY): Payer: Self-pay | Admitting: Family Medicine

## 2015-06-10 DIAGNOSIS — Z1231 Encounter for screening mammogram for malignant neoplasm of breast: Secondary | ICD-10-CM

## 2015-06-13 ENCOUNTER — Inpatient Hospital Stay (HOSPITAL_COMMUNITY): Admission: RE | Admit: 2015-06-13 | Payer: Self-pay | Source: Ambulatory Visit

## 2015-06-23 ENCOUNTER — Ambulatory Visit (HOSPITAL_COMMUNITY)
Admission: RE | Admit: 2015-06-23 | Discharge: 2015-06-23 | Disposition: A | Discharge status: Home / Self Care | Payer: BLUE CROSS/BLUE SHIELD | Source: Ambulatory Visit | Attending: Family Medicine | Admitting: Family Medicine

## 2015-06-23 DIAGNOSIS — Z1231 Encounter for screening mammogram for malignant neoplasm of breast: Secondary | ICD-10-CM | POA: Insufficient documentation

## 2015-07-15 DIAGNOSIS — H73893 Other specified disorders of tympanic membrane, bilateral: Secondary | ICD-10-CM | POA: Insufficient documentation

## 2015-07-15 DIAGNOSIS — H6122 Impacted cerumen, left ear: Secondary | ICD-10-CM | POA: Insufficient documentation

## 2015-10-15 ENCOUNTER — Encounter: Payer: Self-pay | Admitting: *Deleted

## 2015-10-28 ENCOUNTER — Other Ambulatory Visit: Payer: Self-pay | Admitting: Women's Health

## 2015-11-11 ENCOUNTER — Other Ambulatory Visit: Payer: Self-pay | Admitting: Women's Health

## 2016-06-09 ENCOUNTER — Other Ambulatory Visit (HOSPITAL_COMMUNITY): Payer: Self-pay | Admitting: Registered Nurse

## 2016-06-09 DIAGNOSIS — Z1231 Encounter for screening mammogram for malignant neoplasm of breast: Secondary | ICD-10-CM

## 2016-06-23 ENCOUNTER — Ambulatory Visit (HOSPITAL_COMMUNITY)
Admission: RE | Admit: 2016-06-23 | Discharge: 2016-06-23 | Disposition: A | Discharge status: Home / Self Care | Payer: BLUE CROSS/BLUE SHIELD | Source: Ambulatory Visit | Attending: Registered Nurse | Admitting: Registered Nurse

## 2016-06-23 DIAGNOSIS — Z1231 Encounter for screening mammogram for malignant neoplasm of breast: Secondary | ICD-10-CM | POA: Diagnosis present

## 2016-07-15 ENCOUNTER — Encounter: Payer: Self-pay | Admitting: Advanced Practice Midwife

## 2016-07-15 ENCOUNTER — Other Ambulatory Visit (HOSPITAL_COMMUNITY)
Admission: RE | Admit: 2016-07-15 | Discharge: 2016-07-15 | Disposition: A | Discharge status: Home / Self Care | Payer: BLUE CROSS/BLUE SHIELD | Source: Ambulatory Visit | Attending: Advanced Practice Midwife | Admitting: Advanced Practice Midwife

## 2016-07-15 ENCOUNTER — Ambulatory Visit (INDEPENDENT_AMBULATORY_CARE_PROVIDER_SITE_OTHER): Payer: BLUE CROSS/BLUE SHIELD | Admitting: Advanced Practice Midwife

## 2016-07-15 VITALS — BP 118/72 | HR 78 | Ht 61.0 in | Wt 211.0 lb

## 2016-07-15 DIAGNOSIS — Z01419 Encounter for gynecological examination (general) (routine) without abnormal findings: Secondary | ICD-10-CM | POA: Diagnosis not present

## 2016-07-15 DIAGNOSIS — Z1151 Encounter for screening for human papillomavirus (HPV): Secondary | ICD-10-CM | POA: Diagnosis present

## 2016-07-15 DIAGNOSIS — Z1211 Encounter for screening for malignant neoplasm of colon: Secondary | ICD-10-CM

## 2016-07-15 NOTE — Progress Notes (Signed)
Kristine CrankerDonna M Montgomery 51 y.o.  Vitals:   07/15/16 1329  BP: 118/72  Pulse: 78     Filed Weights   07/15/16 1329  Weight: 211 lb (95.7 kg)    Past Medical History: Past Medical History:  Diagnosis Date  . Edema extremities     Past Surgical History: Past Surgical History:  Procedure Laterality Date  . CESAREAN SECTION    . TONSILLECTOMY      Family History: Family History  Problem Relation Age of Onset  . Cancer Paternal Grandfather     colon  . Cancer Maternal Grandmother     lung  . Non-Hodgkin's lymphoma Mother   . Diabetes Mother   . Cancer Mother     cervical  . Hypertension Sister     Social History: Social History  Substance Use Topics  . Smoking status: Current Every Day Smoker    Packs/day: 0.50  . Smokeless tobacco: Never Used  . Alcohol use Yes     Comment: occasional    Allergies: No Known Allergies    Current Outpatient Prescriptions:  .  phentermine 37.5 MG capsule, Take 37.5 mg by mouth every morning., Disp: , Rfl:   History of Present Illness: Here for first pap in >20 years.  Sees PCP yearly but "just hasn't wanted to get a pap".  Mammogram earlier this month (normal).  Hasn't had a colonoscopy yet, but knows she needs one for screening purposes.     Review of Systems   Patient denies any headaches, blurred vision, shortness of breath, chest pain, abdominal pain, problems with bowel movements, urination, or intercourse.   Physical Exam: General:  Well developed, well nourished, no acute distress Skin:  Warm and dry Neck:  Midline trachea, normal thyroid Lungs; Clear to auscultation bilaterally Breast:  Deferred d/t recent mammogram Cardiovascular: Regular rate and rhythm Abdomen:  Soft, non tender, no hepatosplenomegaly Pelvic:  External genitalia is normal in appearance.  The vagina is normal in appearance.  The cervix is bulbous.  Uterus is felt to be normal size, shape, and contour.  No adnexal masses or tenderness noted, exam  limited by body habitus Extremities:  No swelling or varicosities noted Rectum:  No polyps/hemorrhoids Hemocult neg Psych:  No mood changes.     Impression: normal GYN exam  Plan: if normal, pap q 5 years

## 2016-07-20 LAB — CYTOLOGY - PAP
Adequacy: ABSENT
Diagnosis: NEGATIVE
HPV (WINDOPATH): NOT DETECTED

## 2016-08-05 ENCOUNTER — Emergency Department (HOSPITAL_COMMUNITY): Payer: BLUE CROSS/BLUE SHIELD

## 2016-08-05 ENCOUNTER — Encounter (HOSPITAL_COMMUNITY): Payer: Self-pay | Admitting: Emergency Medicine

## 2016-08-05 ENCOUNTER — Emergency Department (HOSPITAL_COMMUNITY)
Admission: EM | Admit: 2016-08-05 | Discharge: 2016-08-06 | Disposition: A | Discharge status: Home / Self Care | Payer: BLUE CROSS/BLUE SHIELD | Attending: Emergency Medicine | Admitting: Emergency Medicine

## 2016-08-05 DIAGNOSIS — N83202 Unspecified ovarian cyst, left side: Secondary | ICD-10-CM

## 2016-08-05 DIAGNOSIS — R1084 Generalized abdominal pain: Secondary | ICD-10-CM

## 2016-08-05 DIAGNOSIS — F17210 Nicotine dependence, cigarettes, uncomplicated: Secondary | ICD-10-CM | POA: Diagnosis not present

## 2016-08-05 DIAGNOSIS — D72829 Elevated white blood cell count, unspecified: Secondary | ICD-10-CM

## 2016-08-05 LAB — COMPREHENSIVE METABOLIC PANEL
ALBUMIN: 3.8 g/dL (ref 3.5–5.0)
ALK PHOS: 92 U/L (ref 38–126)
ALT: 11 U/L — AB (ref 14–54)
AST: 15 U/L (ref 15–41)
Anion gap: 6 (ref 5–15)
BUN: 15 mg/dL (ref 6–20)
CALCIUM: 9.2 mg/dL (ref 8.9–10.3)
CHLORIDE: 105 mmol/L (ref 101–111)
CO2: 27 mmol/L (ref 22–32)
CREATININE: 0.56 mg/dL (ref 0.44–1.00)
GFR calc Af Amer: >60 mL/min (ref >60)
GFR calc non Af Amer: >60 mL/min (ref >60)
GLUCOSE: 89 mg/dL (ref 65–99)
Potassium: 4 mmol/L (ref 3.5–5.1)
SODIUM: 138 mmol/L (ref 135–145)
Total Bilirubin: 0.3 mg/dL (ref 0.3–1.2)
Total Protein: 7.9 g/dL (ref 6.5–8.1)

## 2016-08-05 LAB — URINALYSIS, ROUTINE W REFLEX MICROSCOPIC
Bacteria, UA: NONE SEEN
Bilirubin Urine: NEGATIVE
GLUCOSE, UA: NEGATIVE mg/dL
Hgb urine dipstick: NEGATIVE
Ketones, ur: NEGATIVE mg/dL
Nitrite: NEGATIVE
Protein, ur: NEGATIVE mg/dL
SPECIFIC GRAVITY, URINE: 1.021 (ref 1.005–1.030)
pH: 5 (ref 5.0–8.0)

## 2016-08-05 LAB — CBC
HCT: 42 % (ref 36.0–46.0)
Hemoglobin: 14.2 g/dL (ref 12.0–15.0)
MCH: 29.6 pg (ref 26.0–34.0)
MCHC: 33.8 g/dL (ref 30.0–36.0)
MCV: 87.5 fL (ref 78.0–100.0)
PLATELETS: 225 10*3/uL (ref 150–400)
RBC: 4.8 MIL/uL (ref 3.87–5.11)
RDW: 13.8 % (ref 11.5–15.5)
WBC: 15.3 10*3/uL — ABNORMAL HIGH (ref 4.0–10.5)

## 2016-08-05 LAB — LIPASE, BLOOD: Lipase: 20 U/L (ref 11–51)

## 2016-08-05 MED ORDER — ONDANSETRON HCL 4 MG/2ML IJ SOLN
4.0000 mg | Freq: Once | INTRAMUSCULAR | Status: AC
  Administered 2016-08-05: 4 mg via INTRAVENOUS
  Filled 2016-08-05: qty 2

## 2016-08-05 MED ORDER — TRAMADOL HCL 50 MG PO TABS: 50.0000 mg | ORAL_TABLET | Freq: Four times a day (QID) | ORAL | 0 refills | Status: DC | PRN

## 2016-08-05 MED ORDER — IOPAMIDOL (ISOVUE-300) INJECTION 61%
100.0000 mL | Freq: Once | INTRAVENOUS | Status: AC | PRN
  Administered 2016-08-05: 100 mL via INTRAVENOUS

## 2016-08-05 MED ORDER — SODIUM CHLORIDE 0.9 % IV SOLN
INTRAVENOUS | Status: DC
  Administered 2016-08-05: 22:00:00 via INTRAVENOUS

## 2016-08-05 MED ORDER — IOPAMIDOL (ISOVUE-300) INJECTION 61%
INTRAVENOUS | Status: AC
  Administered 2016-08-05: 30 mL
  Filled 2016-08-05: qty 30

## 2016-08-05 MED ORDER — SODIUM CHLORIDE 0.9 % IV BOLUS (SEPSIS)
500.0000 mL | Freq: Once | INTRAVENOUS | Status: AC
  Administered 2016-08-05: 500 mL via INTRAVENOUS

## 2016-08-05 NOTE — Discharge Instructions (Signed)
CT scan showed evidence of complex left ovarian cyst which will require follow-up by OB/GYN. I know you were just recently seen there but reschedule appointment for additional workup of the cyst. Also CT of the belly without any significant findings other than some reactive lymphadenopathy and a questionable sigmoid mesenteric hernia. But no signs of obstruction. Discussed with Dr. Lovell SheehanJenkins on call for surgery. Nothing to be done at this point in time recommends follow-up with primary care doctor.  Very important to return for the onset of worse abdominal pain or persistent vomiting. This could be a sign of a hernia getting obstructed.

## 2016-08-05 NOTE — ED Triage Notes (Signed)
Patient c/o generalized abd pain x3 weeks. Denies any nausea, vomiting, diarrhea, fevers, or urinary symptoms. Per patient tender with palpitation. Patient reports last normal BM yesterday. Denies any blood in stool.

## 2016-08-05 NOTE — ED Provider Notes (Signed)
[facility]Y [facility]PT Provider Note   CSN: [id number] Arrival date & time: 08/05/16  1708 By signing my name below, I, Levon Hedger, attest that this documentation has been prepared under the direction and in the presence of Vanetta Mulders, MD . Electronically Signed: Levon Hedger, Scribe. 08/05/2016. 9:34 PM.   History   Chief Complaint Chief Complaint  Patient presents with  . Abdominal Pain    HPI MONI ROTHROCK is a 51 y.o. female who presents to the Emergency Department complaining of persistent generalized abdominal pain onset three weeks ago. She currently rates her pain as 5/10 and describes this as soreness. Pt has never experienced this pain before. No alleviating or modifying factors noted. No prior treatments indicated. She denies use of blood thinners. Pt denies any fever, chills, rhinorrhea, sore throat, cough, visual disturbance, SOB, CP, n/v/d, dysuria, hematuria, back pain, joint swelling, or headaches.  The history is provided by the patient. No language interpreter was used.    Past Medical History:  Diagnosis Date  . Edema extremities     There are no active problems to display for this patient.   Past Surgical History:  Procedure Laterality Date  . CESAREAN SECTION    . TONSILLECTOMY      OB History    Gravida Para Term Preterm AB Living   1         2   SAB TAB Ectopic Multiple Live Births                  Home Medications    Prior to Admission medications   Medication Sig Start Date End Date Taking? Authorizing Provider  phentermine 37.5 MG capsule Take 37.5 mg by mouth every morning.   Yes Historical Provider, MD  traMADol (ULTRAM) 50 MG tablet Take 1 tablet (50 mg total) by mouth every 6 (six) hours as needed. 08/05/16   Vanetta Mulders, MD    Family History Family History  Problem Relation Age of Onset  . Cancer Paternal Grandfather     colon  . Cancer Maternal Grandmother     lung  . Non-Hodgkin's lymphoma Mother   . Diabetes Mother    . Cancer Mother     cervical  . Hypertension Sister     Social History Social History  Substance Use Topics  . Smoking status: Current Every Day Smoker    Packs/day: 0.50    Types: Cigarettes  . Smokeless tobacco: Never Used  . Alcohol use Yes     Comment: occasional    Allergies   Patient has no known allergies.  Review of Systems Review of Systems  Constitutional: Negative for chills and fever.  HENT: Negative for rhinorrhea, sneezing and sore throat.   Eyes: Negative for visual disturbance.  Respiratory: Negative for cough and shortness of breath.   Cardiovascular: Negative for chest pain.  Gastrointestinal: Positive for abdominal pain. Negative for diarrhea, nausea and vomiting.  Genitourinary: Negative for dysuria and hematuria.  Musculoskeletal: Negative for back pain and joint swelling.  Skin: Negative for rash.  Neurological: Negative for headaches.  Hematological: Does not bruise/bleed easily.  Psychiatric/Behavioral: Negative for confusion.    Physical Exam Updated Vital Signs BP (!) 138/101 (BP Location: Left Arm)   Pulse 74   Temp 98.2 F (36.8 C) (Oral)   Resp 18   Ht 5\' 1"  (1.549 m)   Wt 96.6 kg   LMP 06/08/2016   SpO2 99%   BMI 40.25 kg/m   Physical Exam  Constitutional:  She is oriented to person, place, and time. She appears well-developed and well-nourished. No distress.  HENT:  Head: Normocephalic and atraumatic.  Eyes: Conjunctivae and EOM are normal. Pupils are equal, round, and reactive to light. No scleral icterus.  Cardiovascular: Normal rate and regular rhythm.   Pulmonary/Chest: Effort normal and breath sounds normal.  Abdominal: Soft. Bowel sounds are normal. She exhibits no distension. There is tenderness (mild tenderness throughout). There is no guarding.  Neurological: She is alert and oriented to person, place, and time. No cranial nerve deficit or sensory deficit. She exhibits normal muscle tone. Coordination normal.  Skin:  Skin is warm and dry.  Psychiatric: She has a normal mood and affect.  Nursing note and vitals reviewed.  ED Treatments / Results  DIAGNOSTIC STUDIES:  Oxygen Saturation is 99% on RA, normal by my interpretation.    COORDINATION OF CARE:  9:15 PM Will order CT abdomen. Discussed treatment plan with pt at bedside and pt agreed to plan.   Labs (all labs ordered are listed, but only abnormal results are displayed) Labs Reviewed  COMPREHENSIVE METABOLIC PANEL - Abnormal; Notable for the following:       Result Value   ALT 11 (*)    All other components within normal limits  CBC - Abnormal; Notable for the following:    WBC 15.3 (*)    All other components within normal limits  URINALYSIS, ROUTINE W REFLEX MICROSCOPIC - Abnormal; Notable for the following:    APPearance HAZY (*)    Leukocytes, UA MODERATE (*)    All other components within normal limits  LIPASE, BLOOD    EKG  EKG Interpretation None       Radiology Ct Abdomen Pelvis W Contrast  Result Date: 08/05/2016 CLINICAL DATA:  51 year old female with abdominal pain. EXAM: CT ABDOMEN AND PELVIS WITH CONTRAST TECHNIQUE: Multidetector CT imaging of the abdomen and pelvis was performed using the standard protocol following bolus administration of intravenous contrast. CONTRAST:  30mL ISOVUE-300 IOPAMIDOL (ISOVUE-300) INJECTION 61%, ISOVUE-300 IOPAMIDOL (ISOVUE-300) INJECTION 61% COMPARISON:  None. FINDINGS: Lower chest: There bibasilar atelectatic changes with probable patchy areas of air trapping. No focal consolidation. No intra-abdominal free air or free fluid. Hepatobiliary: There is a 2 cm rim calcified stone within the gallbladder. No pericholecystic fluid on the CT. Ultrasound may provide better evaluation of the gallbladder if clinically indicated. There is fatty infiltration of the liver. No intrahepatic biliary ductal dilatation. Pancreas: Unremarkable. No pancreatic ductal dilatation or surrounding inflammatory  changes. Spleen: Normal in size without focal abnormality. Adrenals/Urinary Tract: There aretwo 50 right adrenal adenoma measuring up to 2 cm. There is slight, likely nodularity of the left adrenal gland, likely related to underlying small adenomas. The kidneys, visualized ureters, and urinary bladder appear unremarkable. There is symmetric uptake and excretion of contrast by the kidneys bilaterally. Stomach/Bowel: Large amount of stool noted throughout the colon. There is redundancy of the sigmoid colon. There is superior extension of the sigmoid with protrusion of the sigmoid through what appears to be a mesentery defect. There is mild air distention of the segment of the sigmoid colon protruding through the mesentery defect. There is collapse of the exiting/distal limb of the sigmoid colon without definite evidence of obstruction. There is no twisting or definite evidence of volvulus. No associated inflammatory changes or pneumatosis. Follow-up with abdominal radiograph recommended to document passage of oral contrast into the rectum. Multiple scattered sigmoid diverticula without active inflammatory changes. There is no evidence of small  bowel obstruction or active inflammation. Normal appendix. Vascular/Lymphatic: Moderate aortoiliac atherosclerotic disease. There is 2 cm infrarenal aortic ectasia. The origins of the celiac axis, SMA, IMA as well as the origins of the renal arteries are patent. The SMV, splenic vein, and main portal vein are patent. Top-normal peripancreatic, pancreatic or caval, and portacaval lymph nodes noted, of indeterminate etiology or clinical significance, likely reactive. A top-normal aortocaval lymph node measures approximately 11 mm in short axis. Reproductive: The uterus is anteverted. There is slight thickened appearance of the left myometrium which may be related to underlying adenomyosis or small fibroid. There is a 3.8 x 5.4 cm complex/multiseptated left ovarian cyst. Ultrasound  may provide better evaluation of the pelvic structures. Other: Midline vertical anterior pelvic wall incisional scar and a small fat containing umbilical hernia. Musculoskeletal: There is degenerative changes of the spine. No acute fracture. IMPRESSION: 1. Redundancy of the sigmoid colon with superior extension through what appears to be a mesenteric defect. There may be associated mild compression of the distal limb of the sigmoid at the mesenteric defect. No definite evidence of obstruction, or volvulus. No inflammatory changes or pneumatosis. Follow-up with abdominal radiograph recommended to document passage of oral contrast into the rectum. 2. Small scattered sigmoid diverticula without active inflammatory changes. No evidence of small-bowel obstruction. Normal appendix. 3. Cholelithiasis. No definite pericholecystic fluid. Ultrasound may provide better evaluation of the gallbladder if clinically indicated. 4. Fatty liver. 5. Bilateral adrenal adenomas 6. Top-normal retroperitoneal and peripancreatic lymph nodes of indeterminate clinical significance or etiology, likely reactive. Clinical correlation is recommended. 7. Complex/septated appearing left ovarian cyst. Ultrasound is recommended for further evaluation of the pelvic structures. Electronically Signed   By: Elgie CollardArash  Radparvar M.D.   On: 08/05/2016 23:06    Procedures Procedures (including critical care time)  Medications Ordered in ED Medications  0.9 %  sodium chloride infusion ( Intravenous New Bag/Given 08/05/16 2159)  ondansetron (ZOFRAN) injection 4 mg (4 mg Intravenous Given 08/05/16 2158)  sodium chloride 0.9 % bolus 500 mL (500 mLs Intravenous New Bag/Given 08/05/16 2158)  iopamidol (ISOVUE-300) 61 % injection (30 mLs  Contrast Given 08/05/16 2226)  iopamidol (ISOVUE-300) 61 % injection 100 mL (100 mLs Intravenous Contrast Given 08/05/16 2226)     Initial Impression / Assessment and Plan / ED Course  I have reviewed the triage vital  signs and the nursing notes.  Pertinent labs & imaging results that were available during my care of the patient were reviewed by me and considered in my medical decision making (see chart for details).     Patient's workup for the abdominal pain is been present for 3 weeks. Without any specific acute abdominal finding. There is evidence of a complex left ovarian cyst or some evidence of some scattered lymphadenopathy that may be reactive. There is suggestion of may be a sigmoid mesenteric hernia but there is no evidence of up struck shin. This would be an internal hernia. Had Dr. Lovell SheehanJenkins review CT scan on the patient. Feels is no reason for admission patient can be discharged given precautions and follow-up with primary care doctor. We'll also refer her to her OB/GYN for additional workup for the complex left ovarian cyst.  Patient nontoxic no acute distress.  Labs significant for a persistent leukocytosis. Patient also had leukocytosis back in 2011 and she'll need to follow-up with her primary care doctor for further evaluation of this.  Final Clinical Impressions(s) / ED Diagnoses   Final diagnoses:  Generalized abdominal pain  Leukocytosis,  unspecified type  Cyst of left ovary    New Prescriptions New Prescriptions   TRAMADOL (ULTRAM) 50 MG TABLET    Take 1 tablet (50 mg total) by mouth every 6 (six) hours as needed.  I personally performed the services described in this documentation, which was scribed in my presence. The recorded information has been reviewed and is accurate.      Vanetta Mulders, MD 08/06/16 0000

## 2016-08-06 NOTE — ED Notes (Signed)
Family at bedside. 

## 2016-08-18 ENCOUNTER — Encounter: Payer: Self-pay | Admitting: Adult Health

## 2016-08-18 ENCOUNTER — Ambulatory Visit (INDEPENDENT_AMBULATORY_CARE_PROVIDER_SITE_OTHER): Payer: BLUE CROSS/BLUE SHIELD | Admitting: Adult Health

## 2016-08-18 VITALS — BP 118/64 | HR 76 | Ht 61.0 in | Wt 211.5 lb

## 2016-08-18 DIAGNOSIS — R232 Flushing: Secondary | ICD-10-CM

## 2016-08-18 DIAGNOSIS — N926 Irregular menstruation, unspecified: Secondary | ICD-10-CM | POA: Diagnosis not present

## 2016-08-18 DIAGNOSIS — N83202 Unspecified ovarian cyst, left side: Secondary | ICD-10-CM | POA: Diagnosis not present

## 2016-08-18 NOTE — Patient Instructions (Signed)
Ovarian Cyst  An ovarian cyst is a fluid-filled sac that forms on an ovary. The ovaries are small organs that produce eggs in women. Various types of cysts can form on the ovaries. Some may cause symptoms and require treatment. Most ovarian cysts go away on their own, are not cancerous (are benign), and do not cause problems. Common types of ovarian cysts include:  Functional (follicle) cysts.  Occur during the menstrual cycle, and usually go away with the next menstrual cycle if you do not get pregnant.  Usually cause no symptoms.  Endometriomas.  Are cysts that form from the tissue that lines the uterus (endometrium).  Are sometimes called "chocolate cysts" because they become filled with blood that turns brown.  Can cause pain in the lower abdomen during intercourse and during your period.  Cystadenoma cysts.  Develop from cells on the outside surface of the ovary.  Can get very large and cause lower abdomen pain and pain with intercourse.  Can cause severe pain if they twist or break open (rupture).  Dermoid cysts.  Are sometimes found in both ovaries.  May contain different kinds of body tissue, such as skin, teeth, hair, or cartilage.  Usually do not cause symptoms unless they get very big.  Theca lutein cysts.  Occur when too much of a certain hormone (human chorionic gonadotropin) is produced and overstimulates the ovaries to produce an egg.  Are most common after having procedures used to assist with the conception of a baby (in vitro fertilization). What are the causes? Ovarian cysts may be caused by:  Ovarian hyperstimulation syndrome. This is a condition that can develop from taking fertility medicines. It causes multiple large ovarian cysts to form.  Polycystic ovarian syndrome (PCOS). This is a common hormonal disorder that can cause ovarian cysts, as well as problems with your period or fertility. What increases the risk? The following factors may make you  more likely to develop ovarian cysts:  Being overweight or obese.  Taking fertility medicines.  Taking certain forms of hormonal birth control.  Smoking. What are the signs or symptoms? Many ovarian cysts do not cause symptoms. If symptoms are present, they may include:  Pelvic pain or pressure.  Pain in the lower abdomen.  Pain during sex.  Abdominal swelling.  Abnormal menstrual periods.  Increasing pain with menstrual periods. How is this diagnosed? These cysts are commonly found during a routine pelvic exam. You may have tests to find out more about the cyst, such as:  Ultrasound.  X-ray of the pelvis.  CT scan.  MRI.  Blood tests. How is this treated? Many ovarian cysts go away on their own without treatment. Your health care provider may want to check your cyst regularly for 2-3 months to see if it changes. If you are in menopause, it is especially important to have your cyst monitored closely because menopausal women have a higher rate of ovarian cancer. When treatment is needed, it may include:  Medicines to help relieve pain.  A procedure to drain the cyst (aspiration).  Surgery to remove the whole cyst.  Hormone treatment or birth control pills. These methods are sometimes used to help dissolve a cyst. Follow these instructions at home:  Take over-the-counter and prescription medicines only as told by your health care provider.  Do not drive or use heavy machinery while taking prescription pain medicine.  Get regular pelvic exams and Pap tests as often as told by your health care provider.  Return to your   normal activities as told by your health care provider. Ask your health care provider what activities are safe for you.  Do not use any products that contain nicotine or tobacco, such as cigarettes and e-cigarettes. If you need help quitting, ask your health care provider.  Keep all follow-up visits as told by your health care provider. This is  important. Contact a health care provider if:  Your periods are late, irregular, or painful, or they stop.  You have pelvic pain that does not go away.  You have pressure on your bladder or trouble emptying your bladder completely.  You have pain during sex.  You have any of the following in your abdomen:  A feeling of fullness.  Pressure.  Discomfort.  Pain that does not go away.  Swelling.  You feel generally ill.  You become constipated.  You lose your appetite.  You develop severe acne.  You start to have more body hair and facial hair.  You are gaining weight or losing weight without changing your exercise and eating habits.  You think you may be pregnant. Get help right away if:  You have abdominal pain that is severe or gets worse.  You cannot eat or drink without vomiting.  You suddenly develop a fever.  Your menstrual period is much heavier than usual. This information is not intended to replace advice given to you by your health care provider. Make sure you discuss any questions you have with your health care provider. Document Released: 05/03/2005 Document Revised: 11/21/2015 Document Reviewed: 10/05/2015 Elsevier Interactive Patient Education  2017 [employer]  

## 2016-08-18 NOTE — Progress Notes (Signed)
Subjective:     Patient ID: Kristine Montgomery, female   DOB: 21-Dec-1965, 51 y.o.   MRN: [id number]  HPI Kristine Montgomery is a 51 year old white female, in follow up of having abdominal; pain and seen in ER at [facility] 08/05/16 and CT showed complex left ovarian cyst.Has irregular periods, is skipping some, and hot flashes.She works at [employer].  PCP is Alvy Beal, NP at [facility].   Review of Systems +pelvic pain at times Hot flashes  Periods irregular  Reviewed past medical,surgical, social and family history. Reviewed medications and allergies.      Objective:   Physical Exam BP 118/64 (BP Location: Left Arm, Patient Position: Sitting, Cuff Size: Large)   Pulse 76   Ht  (1.549 m)   Wt 211 lb 8 oz (95.9 kg)   LMP 08/09/2016 (Exact Date)   BMI 39.96 kg/m   Skin warm and dry.Pelvic: external genitalia is normal in appearance no lesions, vagina has good color, moisture and rugae,urethra has no lesions or masses noted, cervix:smooth and bulbous, uterus: normal size, shape and contour, mildly tender, no masses felt, adnexa: no masses or tenderness noted. Bladder is non tender and no masses felt.   Reviewed CT with her, will get [place], aware may have to repeat US in 8 weeks to see if cyst resolved or not. Will talk after [place] results in, may discuss more about menopause and HRT.  Assessment:       1. Cyst of left ovary   2. Hot flashes   3. Irregular periods       Plan:     Return in 1 day for GYN [place] Review handout on ovarian cyst F/U with PCP tomorrow as scheduled and discuss CT with her

## 2016-08-19 ENCOUNTER — Ambulatory Visit (INDEPENDENT_AMBULATORY_CARE_PROVIDER_SITE_OTHER): Payer: BLUE CROSS/BLUE SHIELD

## 2016-08-19 DIAGNOSIS — N83202 Unspecified ovarian cyst, left side: Secondary | ICD-10-CM

## 2016-08-19 NOTE — Progress Notes (Addendum)
PELVIC US TA/TV: homogeneous anteverted uterus wnl,enlarged ovaries bilat,right ovary vol 10 ml,left ovary vol 34 ml,EEC 8 mm,mult simple nabothian cysts,no free fluid,no pain during ultrasound,ovaries appear mobile

## 2016-08-19 NOTE — Progress Notes (Deleted)
PELVIC UT TA/TV: homogeneous anteverted uterus,wnl,EEC 10.8 mm,homogeneous right adnexal mass adjacent to right ovary 3.8 x 2.4 x 3.8 cm,simple right ovarian cyst 3.3 x 1.7 x 1.8 cm, normal left ovary w/simple dominate follicle 2.3 x 1.6 x 1.9 cm,no free fluid,pelvic pain during ultrasound,ovaries appear mobile   

## 2016-08-23 ENCOUNTER — Telehealth: Payer: Self-pay | Admitting: *Deleted

## 2016-08-23 NOTE — Telephone Encounter (Signed)
Pt aware cyst resolved, just a follicle now

## 2016-08-30 ENCOUNTER — Encounter: Payer: Self-pay | Admitting: Internal Medicine

## 2016-09-23 ENCOUNTER — Ambulatory Visit: Payer: BLUE CROSS/BLUE SHIELD | Admitting: Nurse Practitioner

## 2017-04-27 ENCOUNTER — Other Ambulatory Visit: Payer: Self-pay

## 2017-04-27 ENCOUNTER — Encounter: Payer: Self-pay | Admitting: Adult Health

## 2017-04-27 ENCOUNTER — Ambulatory Visit: Payer: BLUE CROSS/BLUE SHIELD | Admitting: Adult Health

## 2017-04-27 VITALS — BP 152/86 | HR 80 | Ht 61.0 in | Wt 206.0 lb

## 2017-04-27 DIAGNOSIS — N907 Vulvar cyst: Secondary | ICD-10-CM | POA: Diagnosis not present

## 2017-04-27 NOTE — Progress Notes (Signed)
Subjective:     Patient ID: Kristine CobbDonna M Montgomery, female   DOB: [birth year], 51 y.o.   MRN: [id number]  HPI Kristine Montgomery is a 51 year old white female in complaining of cyst in vaginal area.No pain but one did pop.  Review of Systems +vaginal cysts Reviewed past medical,surgical, social and family history. Reviewed medications and allergies.     Objective:   Physical Exam BP (!) 152/86 (BP Location: Right Arm, Patient Position: Sitting, Cuff Size: Normal)   Pulse 80   Ht 5\' 1"  (1.549 m)   Wt 206 lb (93.4 kg)   LMP 04/12/2017   BMI 38.92 kg/m Skin warm and dry, has numerous epidermal cysts on both labia, largest is about 1.5 cm oval right labia near introitus. Showed her pictures in [facility].  She is aware they can be normal and unless painful, leave alone.     Assessment:     1. Epidermal cyst of vulva       Plan:     Do not squeeze Will follow for now, if wants excised can call for appt, she declines for now  F/U prn

## 2017-06-20 ENCOUNTER — Other Ambulatory Visit (HOSPITAL_COMMUNITY): Payer: Self-pay | Admitting: Internal Medicine

## 2017-06-20 ENCOUNTER — Other Ambulatory Visit (HOSPITAL_COMMUNITY): Payer: Self-pay | Admitting: Family Medicine

## 2017-06-20 DIAGNOSIS — Z1231 Encounter for screening mammogram for malignant neoplasm of breast: Secondary | ICD-10-CM

## 2017-06-24 ENCOUNTER — Ambulatory Visit (HOSPITAL_COMMUNITY): Payer: BLUE CROSS/BLUE SHIELD

## 2017-06-27 ENCOUNTER — Ambulatory Visit (HOSPITAL_COMMUNITY): Payer: BLUE CROSS/BLUE SHIELD

## 2017-07-01 ENCOUNTER — Ambulatory Visit (HOSPITAL_COMMUNITY): Payer: BLUE CROSS/BLUE SHIELD

## 2017-08-01 ENCOUNTER — Ambulatory Visit (HOSPITAL_COMMUNITY)
Admission: RE | Admit: 2017-08-01 | Discharge: 2017-08-01 | Disposition: A | Discharge status: Home / Self Care | Payer: BLUE CROSS/BLUE SHIELD | Source: Ambulatory Visit | Attending: Family Medicine | Admitting: Family Medicine

## 2017-08-01 ENCOUNTER — Other Ambulatory Visit (HOSPITAL_COMMUNITY): Payer: Self-pay | Admitting: Family Medicine

## 2017-08-01 DIAGNOSIS — R05 Cough: Secondary | ICD-10-CM | POA: Insufficient documentation

## 2017-08-01 DIAGNOSIS — R059 Cough, unspecified: Secondary | ICD-10-CM

## 2017-08-04 ENCOUNTER — Encounter: Payer: Self-pay | Admitting: Internal Medicine

## 2017-08-25 ENCOUNTER — Telehealth: Payer: Self-pay

## 2017-08-25 ENCOUNTER — Ambulatory Visit: Payer: BLUE CROSS/BLUE SHIELD

## 2017-08-25 NOTE — Telephone Encounter (Signed)
PATIENT WAS A NO SHOW AND LETTER SENT  °

## 2017-08-25 NOTE — Telephone Encounter (Signed)
noted 

## 2017-11-30 ENCOUNTER — Other Ambulatory Visit (HOSPITAL_COMMUNITY): Payer: Self-pay | Admitting: Internal Medicine

## 2017-11-30 DIAGNOSIS — Z1231 Encounter for screening mammogram for malignant neoplasm of breast: Secondary | ICD-10-CM

## 2017-12-07 ENCOUNTER — Ambulatory Visit (HOSPITAL_COMMUNITY)
Admission: RE | Admit: 2017-12-07 | Discharge: 2017-12-07 | Disposition: A | Discharge status: Home / Self Care | Payer: BLUE CROSS/BLUE SHIELD | Source: Ambulatory Visit | Attending: Internal Medicine | Admitting: Internal Medicine

## 2017-12-07 ENCOUNTER — Encounter (HOSPITAL_COMMUNITY): Payer: Self-pay | Admitting: Radiology

## 2017-12-07 DIAGNOSIS — Z1231 Encounter for screening mammogram for malignant neoplasm of breast: Secondary | ICD-10-CM | POA: Diagnosis not present

## 2018-01-31 ENCOUNTER — Encounter: Payer: Self-pay | Admitting: Internal Medicine

## 2018-02-15 ENCOUNTER — Other Ambulatory Visit (HOSPITAL_COMMUNITY): Payer: Self-pay | Admitting: Internal Medicine

## 2018-02-15 ENCOUNTER — Ambulatory Visit (HOSPITAL_COMMUNITY)
Admission: RE | Admit: 2018-02-15 | Discharge: 2018-02-15 | Disposition: A | Discharge status: Home / Self Care | Payer: BLUE CROSS/BLUE SHIELD | Source: Ambulatory Visit | Attending: Internal Medicine | Admitting: Internal Medicine

## 2018-02-15 DIAGNOSIS — S8992XA Unspecified injury of left lower leg, initial encounter: Secondary | ICD-10-CM | POA: Insufficient documentation

## 2018-02-15 DIAGNOSIS — M1712 Unilateral primary osteoarthritis, left knee: Secondary | ICD-10-CM | POA: Diagnosis not present

## 2018-02-20 ENCOUNTER — Encounter (HOSPITAL_COMMUNITY): Payer: Self-pay | Admitting: Emergency Medicine

## 2018-02-20 ENCOUNTER — Emergency Department (HOSPITAL_COMMUNITY): Payer: BLUE CROSS/BLUE SHIELD

## 2018-02-20 ENCOUNTER — Emergency Department (HOSPITAL_COMMUNITY)
Admission: EM | Admit: 2018-02-20 | Discharge: 2018-02-20 | Disposition: A | Discharge status: Home / Self Care | Payer: BLUE CROSS/BLUE SHIELD | Attending: Emergency Medicine | Admitting: Emergency Medicine

## 2018-02-20 DIAGNOSIS — F17210 Nicotine dependence, cigarettes, uncomplicated: Secondary | ICD-10-CM | POA: Insufficient documentation

## 2018-02-20 DIAGNOSIS — Z79899 Other long term (current) drug therapy: Secondary | ICD-10-CM | POA: Diagnosis not present

## 2018-02-20 DIAGNOSIS — J181 Lobar pneumonia, unspecified organism: Secondary | ICD-10-CM

## 2018-02-20 DIAGNOSIS — J189 Pneumonia, unspecified organism: Secondary | ICD-10-CM | POA: Diagnosis not present

## 2018-02-20 DIAGNOSIS — R05 Cough: Secondary | ICD-10-CM | POA: Diagnosis present

## 2018-02-20 HISTORY — DX: Unspecified chronic bronchitis: J42

## 2018-02-20 MED ORDER — AZITHROMYCIN 250 MG PO TABS: 250.0000 mg | ORAL_TABLET | Freq: Every day | ORAL | 0 refills | Status: DC

## 2018-02-20 MED ORDER — ALBUTEROL SULFATE HFA 108 (90 BASE) MCG/ACT IN AERS: 2.0000 | INHALATION_SPRAY | RESPIRATORY_TRACT | 3 refills | Status: DC | PRN

## 2018-02-20 MED ORDER — BENZONATATE 100 MG PO CAPS: 100.0000 mg | ORAL_CAPSULE | Freq: Three times a day (TID) | ORAL | 0 refills | Status: DC

## 2018-02-20 MED ORDER — ALBUTEROL (5 MG/ML) CONTINUOUS INHALATION SOLN
10.0000 mg/h | INHALATION_SOLUTION | RESPIRATORY_TRACT | Status: AC
  Filled 2018-02-20: qty 20

## 2018-02-20 NOTE — ED Provider Notes (Signed)
[facility] EMERGENCY DEPARTMENT Provider Note   CSN: [id number] Arrival date & time: 02/20/18  4098     History   Chief Complaint Chief Complaint  Patient presents with  . Cough    HPI Kristine Montgomery is a 52 y.o. female.  HPI  Patient is a 52 year old female, she presents to the hospital today with a complaint of some upper respiratory symptoms which have been present for approximately 5 or 6 days.  She complains of sore throat, nasal drainage, postnasal drip, coughing and states that she was treated for a "head cold" by her family doctor with amoxicillin starting 5 days ago.  Despite adherence to this medication she continues to have her symptoms and now has increasing coughing, wheezing and shortness of breath.  She does have an inhaler at home, she was given that for a prior infection, she has not been diagnosed with COPD but has a many year history of smoking, currently smoking 1/2 to 1 pack of cigarettes per day.  Symptoms are persistent, nothing seems to make this better or worse, shortness of breath seems to get worse on exertion.  Past Medical History:  Diagnosis Date  . Chronic bronchitis (HCC)   . Edema extremities   . Ovarian cyst     There are no active problems to display for this patient.   Past Surgical History:  Procedure Laterality Date  . CESAREAN SECTION    . TONSILLECTOMY    . TUBAL LIGATION       OB History    Gravida  1   Para  1   Term  1   Preterm      AB      Living  2     SAB      TAB      Ectopic      Multiple  1   Live Births  2            Home Medications    Prior to Admission medications   Medication Sig Start Date End Date Taking? Authorizing Provider  amoxicillin-clavulanate (AUGMENTIN) 875-125 MG tablet Take 1 tablet by mouth 2 (two) times daily.  02/15/18  Yes [provider]  HYDROcodone-acetaminophen (NORCO/VICODIN) 5-325 MG tablet Take 1 tablet every 4 hours as needed for pain 02/15/18  Yes  [provider]  phentermine 37.5 MG capsule Take 37.5 mg by mouth every morning.   Yes [provider]  albuterol (PROVENTIL HFA;VENTOLIN HFA) 108 (90 Base) MCG/ACT inhaler Inhale 2 puffs into the lungs every 4 (four) hours as needed for wheezing or shortness of breath. 02/20/18   Eber Hong, MD  azithromycin (ZITHROMAX Z-PAK) 250 MG tablet Take 1 tablet (250 mg total) by mouth daily. 500mg  PO day 1, then 250mg  PO days 205 02/20/18   Eber Hong, MD  benzonatate (TESSALON) 100 MG capsule Take 1 capsule (100 mg total) by mouth every 8 (eight) hours. 02/20/18   Eber Hong, MD    Family History Family History  Problem Relation Age of Onset  . Cancer Paternal Grandfather        colon  . Cancer Maternal Grandmother        lung  . Non-Hodgkin's lymphoma Mother   . Diabetes Mother   . Cancer Mother        cervical  . Hypertension Sister   . Other Son        fluid around heart    Social History Social History   Tobacco  Use  . Smoking status: Current Every Day Smoker    Packs/day: 0.50    Years: 30.00    Pack years: 15.00    Types: Cigarettes  . Smokeless tobacco: Never Used  Substance Use Topics  . Alcohol use: Yes    Comment: occasional  . Drug use: No     Allergies   Patient has no known allergies.   Review of Systems Review of Systems  All other systems reviewed and are negative.    Physical Exam Updated Vital Signs BP 139/70   Pulse 92   Temp 98.3 F (36.8 C) (Oral)   Resp 17   Ht 1.549 m (5\' 1" )   Wt 107 kg   SpO2 99%   BMI 44.59 kg/m   Physical Exam  Constitutional: She appears well-developed and well-nourished. No distress.  HENT:  Head: Normocephalic and atraumatic.  Mouth/Throat: Oropharynx is clear and moist. No oropharyngeal exudate.  Oropharynx is clear, no signs of exudate or asymmetry, tympanic membranes are clear bilaterally  Eyes: Pupils are equal, round, and reactive to light. Conjunctivae and EOM are normal. Right  eye exhibits no discharge. Left eye exhibits no discharge. No scleral icterus.  Neck: Normal range of motion. Neck supple. No JVD present. No thyromegaly present.  Cardiovascular: Normal rate, regular rhythm, normal heart sounds and intact distal pulses. Exam reveals no gallop and no friction rub.  No murmur heard. Pulmonary/Chest: No respiratory distress. She has wheezes. She has no rales.  Mild tachypnea, rhonchi present, speaks in full sentences, no increased work of breathing  Abdominal: Soft. Bowel sounds are normal. She exhibits no distension and no mass. There is no tenderness.  Musculoskeletal: Normal range of motion. She exhibits no edema or tenderness.  Lymphadenopathy:    She has no cervical adenopathy.  Neurological: She is alert. Coordination normal.  Skin: Skin is warm and dry. No rash noted. No erythema.  Psychiatric: She has a normal mood and affect. Her behavior is normal.  Nursing note and vitals reviewed.    ED Treatments / Results  Labs (all labs ordered are listed, but only abnormal results are displayed) Labs Reviewed - No data to display  EKG None  Radiology Dg Chest 2 View  Result Date: 02/20/2018 CLINICAL DATA:  Cough and shortness of breath for several days EXAM: CHEST - 2 VIEW COMPARISON:  08/01/2017 FINDINGS: Cardiac shadows within normal limits. The right lung is clear. The left lung demonstrates patchy infiltrate in the base along the left heart border projecting in the lingula. No sizable effusion is noted. Degenerative changes of the thoracic spine are seen. IMPRESSION: Lingular pneumonia. Followup PA and lateral chest X-ray is recommended in 3-4 weeks following trial of antibiotic therapy to ensure resolution and exclude underlying malignancy. Electronically Signed   By: Alcide Clever M.D.   On: 02/20/2018 13:52    Procedures Procedures (including critical care time)  Medications Ordered in ED Medications  albuterol (PROVENTIL,VENTOLIN) solution  continuous neb (10 mg/hr Nebulization New Bag/Given 02/20/18 1035)     Initial Impression / Assessment and Plan / ED Course  I have reviewed the triage vital signs and the nursing notes.  Pertinent labs & imaging results that were available during my care of the patient were reviewed by me and considered in my medical decision making (see chart for details).  Clinical Course as of Feb 20 1405  Mon Feb 20, 2018  1404 I have personally viewed the two-view PA and lateral chest, there does appear to  be a lingular pneumonia, thickly the patient is not febrile but not tachycardic, not hypotensive or hypoxic.  At this time the patient will have increased coverage in addition to the amoxicillin which would cover for streptococcal species she will have some atypical coverage with Zithromax as well.  She is stable for discharge   [BM]    Clinical Course User Index [BM] Eber Hong, MD    Overall the patient is well-appearing, she does appear to have symptoms consistent with an upper respiratory infection with nasal congestion, postnasal drip, coughing, productive of some phlegm.  She is wheezing diffusely on exam consistent with reactive airway disease, I counseled the patient on smoking cessation, will obtain an x-ray to rule out pneumonia otherwise the patient will need aggressive supportive care including bronchodilators, antitussives, decongestants, antihistamines.  Patient agreeable  Final Clinical Impressions(s) / ED Diagnoses   Final diagnoses:  Community acquired pneumonia of left lower lobe of lung [facility])    ED Discharge Orders         Ordered    azithromycin (ZITHROMAX Z-PAK) 250 MG tablet  Daily     02/20/18 1405    benzonatate (TESSALON) 100 MG capsule  Every 8 hours     02/20/18 1405    albuterol (PROVENTIL HFA;VENTOLIN HFA) 108 (90 Base) MCG/ACT inhaler  Every 4 hours PRN     02/20/18 1405           Eber Hong, MD 02/20/18 1406

## 2018-02-20 NOTE — ED Triage Notes (Signed)
Pt reports cough and congestion with exertional dyspnea since last Wednesday.  Went to pcp and was rx Amoxicillin with no relief but worsening of symptoms.  States cough is productive with clear sputum but is blowing green out of nose.

## 2018-02-20 NOTE — Discharge Instructions (Signed)
Your x-ray shows a small pneumonia in the left side of your chest.  Please continue to take amoxicillin but add Zithromax.  You should follow-up with your doctor in 2 days.  I would expect that he will continue to cough and have wheezing for the next week but it should gradually get better.  Return to the emergency department for severe or worsening symptoms.

## 2018-04-10 ENCOUNTER — Ambulatory Visit: Payer: BLUE CROSS/BLUE SHIELD | Admitting: Adult Health

## 2018-04-10 ENCOUNTER — Encounter: Payer: Self-pay | Admitting: Adult Health

## 2018-04-10 VITALS — BP 153/82 | HR 78 | Ht 61.0 in | Wt 250.0 lb

## 2018-04-10 DIAGNOSIS — N3941 Urge incontinence: Secondary | ICD-10-CM | POA: Diagnosis not present

## 2018-04-10 DIAGNOSIS — R350 Frequency of micturition: Secondary | ICD-10-CM

## 2018-04-10 DIAGNOSIS — N3281 Overactive bladder: Secondary | ICD-10-CM

## 2018-04-10 LAB — POCT URINALYSIS DIPSTICK
Blood, UA: NEGATIVE
GLUCOSE UA: NEGATIVE
LEUKOCYTES UA: NEGATIVE
NITRITE UA: NEGATIVE
PROTEIN UA: NEGATIVE

## 2018-04-10 MED ORDER — MIRABEGRON ER 25 MG PO TB24: 25.0000 mg | ORAL_TABLET | Freq: Every day | ORAL | 0 refills | Status: DC

## 2018-04-10 NOTE — Progress Notes (Signed)
  Subjective:     Patient ID: Kristine Montgomery, female   DOB: [birth year], 52 y.o.   MRN: [id number]  HPI Kristine Montgomery is a 52 year old white female, in complaining of urinary frequency and urinary incontinence.   Review of Systems Urinary frequency  Urinary incontinence(urge) Reviewed past medical,surgical, social and family history. Reviewed medications and allergies.     Objective:   Physical Exam BP (!) 153/82 (BP Location: Right Arm, Patient Position: Sitting, Cuff Size: Normal)   Pulse 78   Ht 5\' 1"  (1.549 m)   Wt 250 lb (113.4 kg)   BMI 47.24 kg/m urine dipstick is negative. Talk only: has to pee about 5 x a day and maybe once at night.When has to go, better go or will wet her pants. Fall risk moderate. Will try myrbetriq.     Assessment:     1. Urinary frequency   2. OAB (overactive bladder)   3. Urge incontinence       Plan:     Meds ordered this encounter  Medications  . mirabegron ER (MYRBETRIQ) 25 MG TB24 tablet    Sig: Take 1 tablet (25 mg total) by mouth daily.    Dispense:  28 tablet    Refill:  0    Order Specific Question:   Supervising Provider    Answer:   Despina HiddenEURE, LUTHER H [[id number]]  F/U in 4 weeks

## 2018-05-08 ENCOUNTER — Ambulatory Visit: Payer: BLUE CROSS/BLUE SHIELD | Admitting: Adult Health

## 2018-05-15 ENCOUNTER — Telehealth: Payer: Self-pay | Admitting: Internal Medicine

## 2018-05-15 ENCOUNTER — Encounter: Payer: Self-pay | Admitting: Gastroenterology

## 2018-05-15 ENCOUNTER — Ambulatory Visit: Payer: BLUE CROSS/BLUE SHIELD | Admitting: Gastroenterology

## 2018-05-15 NOTE — Telephone Encounter (Signed)
Patient was a no show and letter sent  °

## 2018-05-26 ENCOUNTER — Ambulatory Visit: Payer: BLUE CROSS/BLUE SHIELD | Admitting: Adult Health

## 2018-06-06 ENCOUNTER — Ambulatory Visit: Payer: Self-pay | Admitting: Adult Health

## 2018-06-07 ENCOUNTER — Ambulatory Visit: Payer: BLUE CROSS/BLUE SHIELD | Admitting: Orthopaedic Surgery

## 2018-06-07 ENCOUNTER — Emergency Department (HOSPITAL_COMMUNITY)
Admission: EM | Admit: 2018-06-07 | Discharge: 2018-06-07 | Disposition: A | Discharge status: Home / Self Care | Payer: PRIVATE HEALTH INSURANCE | Attending: Emergency Medicine | Admitting: Emergency Medicine

## 2018-06-07 ENCOUNTER — Emergency Department (HOSPITAL_COMMUNITY): Payer: PRIVATE HEALTH INSURANCE

## 2018-06-07 ENCOUNTER — Other Ambulatory Visit: Payer: Self-pay

## 2018-06-07 ENCOUNTER — Encounter (HOSPITAL_COMMUNITY): Payer: Self-pay | Admitting: *Deleted

## 2018-06-07 DIAGNOSIS — Z79899 Other long term (current) drug therapy: Secondary | ICD-10-CM | POA: Insufficient documentation

## 2018-06-07 DIAGNOSIS — F17210 Nicotine dependence, cigarettes, uncomplicated: Secondary | ICD-10-CM | POA: Insufficient documentation

## 2018-06-07 DIAGNOSIS — M25511 Pain in right shoulder: Secondary | ICD-10-CM

## 2018-06-07 MED ORDER — IBUPROFEN 800 MG PO TABS: 800.0000 mg | ORAL_TABLET | Freq: Three times a day (TID) | ORAL | 0 refills | Status: DC

## 2018-06-07 MED ORDER — IBUPROFEN 800 MG PO TABS
800.0000 mg | ORAL_TABLET | Freq: Once | ORAL | Status: AC
  Administered 2018-06-07: 800 mg via ORAL
  Filled 2018-06-07: qty 1

## 2018-06-07 NOTE — ED Triage Notes (Signed)
Right shoulder pain

## 2018-06-07 NOTE — ED Notes (Signed)
Patient c/o R shoulder pain that started last night after she woke up from a nap. Patient c/o limited ROM and pain with mvmt, reaching, and grasping. Patient able to resist internal and external rotation, able to push against resistence. Unable to raise arm above her shoulder.

## 2018-06-07 NOTE — Discharge Instructions (Addendum)
As discussed, your xray is negative today with no arthritis or other findings to explain your pain.  Wear the sling for comfort.  Use a heating pad applied for 20 minutes several times daily and take the ibuprofen to help with pain and inflammation.

## 2018-06-08 ENCOUNTER — Ambulatory Visit: Payer: PRIVATE HEALTH INSURANCE | Admitting: Orthopaedic Surgery

## 2018-06-08 ENCOUNTER — Encounter: Payer: Self-pay | Admitting: Orthopaedic Surgery

## 2018-06-08 VITALS — BP 157/93 | HR 75 | Ht 61.0 in | Wt 250.0 lb

## 2018-06-08 DIAGNOSIS — G8929 Other chronic pain: Secondary | ICD-10-CM | POA: Diagnosis not present

## 2018-06-08 DIAGNOSIS — M25562 Pain in left knee: Secondary | ICD-10-CM

## 2018-06-08 NOTE — ED Provider Notes (Signed)
[facility] EMERGENCY DEPARTMENT Provider Note   CSN: [id number] Arrival date & time: 06/07/18  1345     History   Chief Complaint Chief Complaint  Patient presents with  . Shoulder Pain    HPI Kristine Montgomery is a 53 y.o. female.  The history is provided by the patient.  Shoulder Pain  Location:  Shoulder and arm Shoulder location:  R shoulder Arm location:  R upper arm Injury: no   Pain details:    Quality:  Aching and throbbing   Radiates to:  Does not radiate   Severity:  Severe   Onset quality:  Unable to specify (Patient took a nap in her recliner chair yesterday evening, and 45 minutes later awoke with severe pain in her right upper arm and shoulder.)   Duration:  1 day   Timing:  Constant   Progression:  Unchanged Handedness:  Right-handed Dislocation: no   Foreign body present:  No foreign bodies Prior injury to area:  No Relieved by:  Rest Ineffective treatments:  Narcotics (She took a hydrocodone tablet this morning which she takes for chronic knee pain.  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  She does not have a history of trauma, no history of gout.   Past Medical History:  Diagnosis Date  . Chronic bronchitis (HCC)   . Edema extremities   . Ovarian cyst     Patient Active Problem List   Diagnosis Date Noted  . Urge incontinence 04/10/2018  . OAB (overactive bladder) 04/10/2018  . Urinary frequency 04/10/2018    Past Surgical History:  Procedure Laterality Date  . CESAREAN SECTION    . TONSILLECTOMY    . TUBAL LIGATION       OB History    Gravida  1   Para  1   Term  1   Preterm      AB        Living  2     SAB      TAB      Ectopic      Multiple  1   Live Births  2            Home Medications    Prior to Admission medications   Medication Sig Start Date End Date Taking? Authorizing Provider  albuterol (PROVENTIL HFA;VENTOLIN HFA) 108 (90 Base) MCG/ACT inhaler Inhale 2 puffs into the lungs every 4 (four) hours as needed for wheezing or shortness of breath. Patient not taking: Reported on 04/10/2018 02/20/18   Eber Hong, MD  amoxicillin-clavulanate (AUGMENTIN) 875-125 MG tablet Take 1 tablet by mouth 2 (two) times daily.  02/15/18   [provider]  azithromycin (ZITHROMAX Z-PAK) 250 MG tablet Take 1 tablet (250 mg total) by mouth daily. 500mg  PO day 1, then 250mg  PO days 205 Patient not taking: Reported on 04/10/2018 02/20/18   Eber Hong, MD  benzonatate (TESSALON) 100 MG capsule Take 1 capsule (100 mg total) by mouth every 8 (eight) hours. Patient not taking: Reported on 04/10/2018 02/20/18   Eber Hong, MD  HYDROcodone-acetaminophen (NORCO/VICODIN) 5-325 MG tablet Take 1 tablet every 4 hours as  needed for pain 02/15/18   [provider]  ibuprofen (ADVIL,MOTRIN) 800 MG tablet Take 1 tablet (800 mg total) by mouth 3 (three) times daily. 06/07/18   Burgess AmorIdol, Colleen Donahoe, PA-C  mirabegron ER (MYRBETRIQ) 25 MG TB24 tablet Take 1 tablet (25 mg total) by mouth daily. 04/10/18   Adline PotterGriffin, Jennifer A, NP  phentermine 37.5 MG capsule Take 37.5 mg by mouth every morning.    [provider]    Family History Family History  Problem Relation Age of Onset  . Cancer Paternal Grandfather        colon  . Cancer Maternal Grandmother        lung  . COPD Father   . Non-Hodgkin's lymphoma Mother   . Diabetes Mother   . Cancer Mother        cervical  . Congestive Heart Failure Mother   . Hypertension Sister   . Other Son        fluid around heart    Social History Social History   Tobacco Use  . Smoking status: Current Every Day Smoker     Packs/day: 0.50    Years: 30.00    Pack years: 15.00    Types: Cigarettes  . Smokeless tobacco: Never Used  Substance Use Topics  . Alcohol use: Yes    Comment: occasional  . Drug use: No     Allergies   Patient has no known allergies.   Review of Systems Review of Systems  Constitutional: Negative for fever.  Musculoskeletal: Positive for arthralgias, joint swelling and myalgias. Negative for back pain, neck pain and stiffness.  Neurological: Negative for weakness and numbness.     Physical Exam Updated Vital Signs BP (!) 155/86 (BP Location: Left Arm)   Pulse 73   Temp 97.7 F (36.5 C) (Oral)   Resp 16   Ht 5\' 2"  (1.575 m)   Wt 108 kg   SpO2 97%   BMI 43.53 kg/m   Physical Exam Vitals signs and nursing note reviewed.  Constitutional:      Appearance: She is well-developed.  HENT:     Head: Atraumatic.  Neck:     Musculoskeletal: Normal range of motion.  Cardiovascular:     Comments: Pulses equal bilaterally Musculoskeletal:        General: Tenderness present. No swelling, deformity or signs of injury.     Right shoulder: She exhibits decreased range of motion and tenderness. She exhibits no bony tenderness, no swelling, no effusion, no crepitus, no spasm and normal pulse.     Comments: There is palpable reproducible pain from patient's anterior right humerus around the biceps insertion site medially with radiation to her mid bicep belly.  There is no palpable spasm.  She does have a prominent palpable brachial vein coursing along her medial upper arm.  She has no distal edema.  Palpation of the left upper arm with a similar easily palpable brachial vein.  There is no erythema.  No rash.  Pain is worsened with both active and passive attempts to abduct or flex above 90 degrees.  Skin:    General: Skin is warm and dry.  Neurological:     Mental Status: She is alert.     Sensory: No sensory deficit.     Deep Tendon Reflexes: Reflexes normal.      ED  Treatments / Results  Labs (all labs ordered are listed, but only abnormal results are displayed) Labs Reviewed - No data to display  EKG None  Radiology Dg Shoulder Right  Result Date: 06/07/2018 CLINICAL DATA:  Right upper arm pain for 1 week.  No known injury. EXAM: RIGHT SHOULDER - 2+ VIEW COMPARISON:  None. FINDINGS: There is no acute bony or joint abnormality. The acromioclavicular and glenohumeral joints are unremarkable. Imaged right lung and ribs appear normal. IMPRESSION: Negative exam. Electronically Signed   By: Drusilla Kanner M.D.   On: 06/07/2018 15:01    Procedures Procedures (including critical care time)  Medications Ordered in ED Medications  ibuprofen (ADVIL,MOTRIN) tablet 800 mg (800 mg Oral Given 06/07/18 1617)     Initial Impression / Assessment and Plan / ED Course  I have reviewed the triage vital signs and the nursing notes.  Pertinent labs & imaging results that were available during my care of the patient were reviewed by me and considered in my medical decision making (see chart for details).     Normal imaging which was reviewed with patient.  We suspect that this is chronic overuse/possible bicep tendinitis.  Or less likely to be an upper extremity DVT with no risk factors for this condition.  She was given a sling for comfort.  Advised to continue her regular home hydrocodone as needed.  Ibuprofen added, discussed heat therapy.  She does have ongoing care with Dr. Romeo Apple, she was advised to follow-up with him if her symptoms persist or worsen.  Final Clinical Impressions(s) / ED Diagnoses   Final diagnoses:  Acute pain of right shoulder    ED Discharge Orders         Ordered    ibuprofen (ADVIL,MOTRIN) 800 MG tablet  3 times daily     06/07/18 1617           Burgess Amor, Cordelia Poche 06/08/18 1333    Bethann Berkshire, MD 06/08/18 1515

## 2018-06-08 NOTE — Progress Notes (Signed)
Subjective:    Patient ID: Kristine Montgomery, female    DOB: Dec 07, 1965, 53 y.o.   MRN: [id number]  HPI She has had pain of the left knee on and off since the end of the summer last year. She has been followed by [facility]. X-rays were done of the left knee 02-15-18 showing: IMPRESSION: Mild osteoarthritis most prominent in the medial tibiofemoral compartment. No acute osseous abnormality or significant joint Effusion.  Her pain continued and got worse around Christmas.  She has swelling, popping and giving way now.  She has fallen secondary to the knee giving way.  She has hurt her right shoulder and is being treated by [facility] for this.  Tylenol and ibuprofen, ice, heat and rubs have not helped.  She is very concerned about the continued pain and giving way.   Review of Systems  Constitutional: Positive for activity change.  Respiratory: Positive for shortness of breath.   Musculoskeletal: Positive for arthralgias, gait problem and joint swelling.  All other systems reviewed and are negative.  For Review of Systems, all other systems reviewed and are negative.  The following is a summary of the past history medically, past history surgically, known current medicines, social history and family history.  This information is gathered electronically by the computer from prior information and documentation.  I review this each visit and have found including this information at this point in the chart is beneficial and informative.   Past Medical History:  Diagnosis Date  . Chronic bronchitis (HCC)   . Edema extremities   . Ovarian cyst     Past Surgical History:  Procedure Laterality Date  . CESAREAN SECTION    . TONSILLECTOMY    . TUBAL LIGATION      Current Outpatient Medications on File Prior to Visit  Medication Sig Dispense Refill  . albuterol (PROVENTIL HFA;VENTOLIN HFA) 108 (90 Base) MCG/ACT inhaler Inhale 2 puffs into the lungs every 4 (four) hours as  needed for wheezing or shortness of breath. (Patient not taking: Reported on 04/10/2018) 1 Inhaler 3  . amoxicillin-clavulanate (AUGMENTIN) 875-125 MG tablet Take 1 tablet by mouth 2 (two) times daily.   0  . azithromycin (ZITHROMAX Z-PAK) 250 MG tablet Take 1 tablet (250 mg total) by mouth daily. 500mg  PO day 1, then 250mg  PO days 205 (Patient not taking: Reported on 04/10/2018) 6 tablet 0  . benzonatate (TESSALON) 100 MG capsule Take 1 capsule (100 mg total) by mouth every 8 (eight) hours. (Patient not taking: Reported on 04/10/2018) 21 capsule 0  . HYDROcodone-acetaminophen (NORCO/VICODIN) 5-325 MG tablet Take 1 tablet every 4 hours as needed for pain  0  . ibuprofen (ADVIL,MOTRIN) 800 MG tablet Take 1 tablet (800 mg total) by mouth 3 (three) times daily. 21 tablet 0  . mirabegron ER (MYRBETRIQ) 25 MG TB24 tablet Take 1 tablet (25 mg total) by mouth daily. 28 tablet 0  . phentermine 37.5 MG capsule Take 37.5 mg by mouth every morning.     No current facility-administered medications on file prior to visit.     Social History   Socioeconomic History  . Marital status: Widowed    Spouse name: Not on file  . Number of children: 2  . Years of education: Not on file  . Highest education level: Not on file  Occupational History  . Not on file  Social Needs  . Financial resource strain: Not on file  . Food insecurity:    Worry: Not  on file    Inability: Not on file  . Transportation needs:    Medical: Not on file    Non-medical: Not on file  Tobacco Use  . Smoking status: Current Every Day Smoker    Packs/day: 0.50    Years: 30.00    Pack years: 15.00    Types: Cigarettes  . Smokeless tobacco: Never Used  Substance and Sexual Activity  . Alcohol use: Yes    Comment: occasional  . Drug use: No  . Sexual activity: Yes    Birth control/protection: Surgical  Lifestyle  . Physical activity:    Days per week: Not on file    Minutes per session: Not on file  . Stress: Not on file    Relationships  . Social connections:    Talks on phone: Not on file    Gets together: Not on file    Attends religious service: Not on file    Active member of club or organization: Not on file    Attends meetings of clubs or organizations: Not on file    Relationship status: Not on file  . Intimate partner violence:    Fear of current or ex partner: Not on file    Emotionally abused: Not on file    Physically abused: Not on file    Forced sexual activity: Not on file  Other Topics Concern  . Not on file  Social History Narrative  . Not on file    Family History  Problem Relation Age of Onset  . Cancer Paternal Grandfather        colon  . Cancer Maternal Grandmother        lung  . COPD Father   . Non-Hodgkin's lymphoma Mother   . Diabetes Mother   . Cancer Mother        cervical  . Congestive Heart Failure Mother   . Hypertension Sister   . Other Son        fluid around heart    BP (!) 157/93   Pulse 75   Ht 5\' 1"  (1.549 m)   Wt 250 lb (113.4 kg)   BMI 47.24 kg/m   Body mass index is 47.24 kg/m.     Objective:   Physical Exam Constitutional:      Appearance: She is well-developed.  HENT:     Head: Normocephalic and atraumatic.  Eyes:     Conjunctiva/sclera: Conjunctivae normal.     Pupils: Pupils are equal, round, and reactive to light.  Neck:     Musculoskeletal: Normal range of motion and neck supple.  Cardiovascular:     Rate and Rhythm: Normal rate and regular rhythm.  Pulmonary:     Effort: Pulmonary effort is normal.  Abdominal:     Palpations: Abdomen is soft.  Musculoskeletal:     Left knee: She exhibits swelling and effusion. Tenderness found. Medial joint line tenderness noted.       Legs:  Skin:    General: Skin is warm and dry.  Neurological:     Mental Status: She is alert and oriented to person, place, and time.     Cranial Nerves: No cranial nerve deficit.     Motor: No abnormal muscle tone.     Coordination: Coordination  normal.     Deep Tendon Reflexes: Reflexes are normal and symmetric. Reflexes normal.  Psychiatric:        Behavior: Behavior normal.        Thought Content: Thought content  normal.        Judgment: Judgment normal.   She has a sling on the right arm.   I have reviewed her prior x-rays.     Assessment & Plan:   Encounter Diagnosis  Name Primary?  . Chronic pain of left knee Yes   PROCEDURE NOTE:  The patient requests injections of the left knee , verbal consent was obtained.  The left knee was prepped appropriately after time out was performed.   Sterile technique was observed and injection of 1 cc of Depo-Medrol 40 mg with several cc's of plain xylocaine. Anesthesia was provided by ethyl chloride and a 20-gauge needle was used to inject the knee area. The injection was tolerated well.  A band aid dressing was applied.  The patient was advised to apply ice later today and tomorrow to the injection sight as needed.  I have scheduled her for a MRI of the left knee.  I will see her back after the MRI.  Call if any problem.  Precautions discussed.   Electronically Signed Darreld Mclean, MD 1/23/202011:06 AM

## 2018-06-21 ENCOUNTER — Ambulatory Visit (HOSPITAL_COMMUNITY): Payer: PRIVATE HEALTH INSURANCE

## 2018-08-01 ENCOUNTER — Emergency Department (HOSPITAL_COMMUNITY)
Admission: EM | Admit: 2018-08-01 | Discharge: 2018-08-01 | Disposition: A | Discharge status: Home / Self Care | Payer: Self-pay | Attending: Emergency Medicine | Admitting: Emergency Medicine

## 2018-08-01 ENCOUNTER — Other Ambulatory Visit: Payer: Self-pay

## 2018-08-01 ENCOUNTER — Encounter (HOSPITAL_COMMUNITY): Payer: Self-pay

## 2018-08-01 ENCOUNTER — Emergency Department (HOSPITAL_COMMUNITY): Payer: Self-pay

## 2018-08-01 DIAGNOSIS — M25521 Pain in right elbow: Secondary | ICD-10-CM | POA: Insufficient documentation

## 2018-08-01 DIAGNOSIS — F17210 Nicotine dependence, cigarettes, uncomplicated: Secondary | ICD-10-CM | POA: Insufficient documentation

## 2018-08-01 DIAGNOSIS — Z79899 Other long term (current) drug therapy: Secondary | ICD-10-CM | POA: Insufficient documentation

## 2018-08-01 MED ORDER — DICLOFENAC SODIUM 75 MG PO TBEC: 75.0000 mg | DELAYED_RELEASE_TABLET | Freq: Two times a day (BID) | ORAL | 0 refills | Status: DC

## 2018-08-01 MED ORDER — HYDROCODONE-ACETAMINOPHEN 5-325 MG PO TABS
1.0000 | ORAL_TABLET | Freq: Once | ORAL | Status: AC
  Administered 2018-08-01: 1 via ORAL
  Filled 2018-08-01: qty 1

## 2018-08-01 MED ORDER — IBUPROFEN 800 MG PO TABS
800.0000 mg | ORAL_TABLET | Freq: Once | ORAL | Status: AC
  Administered 2018-08-01: 800 mg via ORAL
  Filled 2018-08-01: qty 2

## 2018-08-01 MED ORDER — TRAMADOL HCL 50 MG PO TABS: 50.0000 mg | ORAL_TABLET | Freq: Four times a day (QID) | ORAL | 0 refills | Status: DC | PRN

## 2018-08-01 NOTE — ED Notes (Signed)
Pt back from x-ray.

## 2018-08-01 NOTE — ED Notes (Signed)
Right elbow pain with movement and without.

## 2018-08-01 NOTE — ED Triage Notes (Signed)
Pt was here in January for right elbow pain. Was given a sling and told she had torn ligaments. Has not made an appointment to follow up with orthopedic doctor. Pt wearing sling.

## 2018-08-01 NOTE — Discharge Instructions (Addendum)
Alternate ice and heat to your elbow.  Minimize use.  Call 1 of the orthopedic providers listed to arrange a follow-up appointment next week if your symptoms are not improving.

## 2018-08-01 NOTE — ED Provider Notes (Signed)
[facility] EMERGENCY DEPARTMENT Provider Note   CSN: [id number] Arrival date & time: 08/01/18  0750    History   Chief Complaint Chief Complaint  Patient presents with  . Arm Injury    HPI Kristine Montgomery is a 53 y.o. female.     HPI   Kristine Montgomery is a 53 y.o. female who presents to the Emergency Department complaining of right elbow pain since last evening.  She reports hx of similar symptoms to her right shoulder two months ago.  She describes the pain as constant and throbbing.  Pain radiates into her forearm and hand with movement.  Pain is worse with attempted extension.  She denies swelling, injury, redness, numbness or weakness of her fingers.  No pain to her neck or chest.  She tried a muscle cream last night without relief.  She endorses repetitive movements of her arms at her job and lifting.     Past Medical History:  Diagnosis Date  . Chronic bronchitis (HCC)   . Edema extremities   . Ovarian cyst     Patient Active Problem List   Diagnosis Date Noted  . Urge incontinence 04/10/2018  . OAB (overactive bladder) 04/10/2018  . Urinary frequency 04/10/2018    Past Surgical History:  Procedure Laterality Date  . CESAREAN SECTION    . TONSILLECTOMY    . TUBAL LIGATION       OB History    Gravida  1   Para  1   Term  1   Preterm      AB      Living  2     SAB      TAB      Ectopic      Multiple  1   Live Births  2            Home Medications    Prior to Admission medications   Medication Sig Start Date End Date Taking? Authorizing Provider  albuterol (PROVENTIL HFA;VENTOLIN HFA) 108 (90 Base) MCG/ACT inhaler Inhale 2 puffs into the lungs every 4 (four) hours as needed for wheezing or shortness of breath. Patient not taking: Reported on 04/10/2018 02/20/18   Eber Hong, MD  amoxicillin-clavulanate (AUGMENTIN) 875-125 MG tablet Take 1 tablet by mouth 2 (two) times daily.  02/15/18   [provider]  azithromycin  (ZITHROMAX Z-PAK) 250 MG tablet Take 1 tablet (250 mg total) by mouth daily. 500mg  PO day 1, then 250mg  PO days 205 Patient not taking: Reported on 04/10/2018 02/20/18   Eber Hong, MD  benzonatate (TESSALON) 100 MG capsule Take 1 capsule (100 mg total) by mouth every 8 (eight) hours. Patient not taking: Reported on 04/10/2018 02/20/18   Eber Hong, MD  HYDROcodone-acetaminophen (NORCO/VICODIN) 5-325 MG tablet Take 1 tablet every 4 hours as needed for pain 02/15/18   [provider]  ibuprofen (ADVIL,MOTRIN) 800 MG tablet Take 1 tablet (800 mg total) by mouth 3 (three) times daily. 06/07/18   Burgess Amor, PA-C  mirabegron ER (MYRBETRIQ) 25 MG TB24 tablet Take 1 tablet (25 mg total) by mouth daily. 04/10/18   Adline Potter, NP  phentermine 37.5 MG capsule Take 37.5 mg by mouth every morning.    [provider]    Family History Family History  Problem Relation Age of Onset  . Cancer Paternal Grandfather        colon  . Cancer Maternal Grandmother        lung  .  COPD Father   . Non-Hodgkin's lymphoma Mother   . Diabetes Mother   . Cancer Mother        cervical  . Congestive Heart Failure Mother   . Hypertension Sister   . Other Son        fluid around heart    Social History Social History   Tobacco Use  . Smoking status: Current Every Day Smoker    Packs/day: 0.50    Years: 30.00    Pack years: 15.00    Types: Cigarettes  . Smokeless tobacco: Never Used  Substance Use Topics  . Alcohol use: Yes    Comment: occasional  . Drug use: No     Allergies   Patient has no known allergies.   Review of Systems Review of Systems  Constitutional: Negative for chills and fever.  Respiratory: Negative for shortness of breath.   Cardiovascular: Negative for chest pain.  Gastrointestinal: Negative for nausea and vomiting.  Musculoskeletal: Positive for arthralgias (right elbow pain). Negative for joint swelling, neck pain and neck stiffness.  Skin:  Negative for color change and wound.  Neurological: Negative for dizziness, weakness, numbness and headaches.     Physical Exam Updated Vital Signs BP (!) 114/96 (BP Location: Left Arm)   Pulse 78   Temp (!) 97.5 F (36.4 C) (Oral)   Resp 12   Ht  (1.549 m)   Wt 108.9 kg   SpO2 97%   BMI 45.35 kg/m   Physical Exam Vitals signs and nursing note reviewed.  Constitutional:      General: She is not in acute distress.    Comments: Pt is tearful and holding her right arm  HENT:     Head: Atraumatic.  Neck:     Musculoskeletal: Normal range of motion. No neck rigidity or muscular tenderness.  Cardiovascular:     Rate and Rhythm: Normal rate and regular rhythm.     Pulses: Normal pulses.  Pulmonary:     Effort: Pulmonary effort is normal.     Breath sounds: Normal breath sounds.  Musculoskeletal:        General: Tenderness present. No swelling.     Left elbow: She exhibits no swelling and no effusion. Tenderness found. Lateral epicondyle tenderness noted.     Comments: ttp over the lateral epicondyle.  Pt able to flex w/o difficulty, she is unable to rotate the elbow due to level of pain.  No excessive warmth, edema or erythema of the joint.   Lymphadenopathy:     Cervical: No cervical adenopathy.  Skin:    General: Skin is warm.     Capillary Refill: Capillary refill takes less than 2 seconds.     Findings: No erythema or rash.  Neurological:     General: No focal deficit present.     Mental Status: She is alert.     Sensory: No sensory deficit.     Motor: No weakness.      ED Treatments / Results  Labs (all labs ordered are listed, but only abnormal results are displayed) Labs Reviewed - No data to display  EKG None  Radiology Dg Elbow Complete Right  Result Date: 08/01/2018 CLINICAL DATA:  Throbbing pain RIGHT elbow, history of torn ligaments EXAM: RIGHT ELBOW - COMPLETE 3+ VIEW COMPARISON:  None FINDINGS: Mild osseous demineralization. Joint spaces  preserved. Spurring at lateral upper condyle. Additional olecranon spur. No acute fracture, dislocation, or bone destruction. No joint effusion. IMPRESSION: Scattered spur formation as above.  No acute osseous abnormalities. Electronically Signed   By: Ulyses Southward M.D.   On: 08/01/2018 09:05    Procedures Procedures (including critical care time)  Medications Ordered in ED Medications  ibuprofen (ADVIL,MOTRIN) tablet 800 mg (has no administration in time range)  HYDROcodone-acetaminophen (NORCO/VICODIN) 5-325 MG per tablet 1 tablet (has no administration in time range)     Initial Impression / Assessment and Plan / ED Course  I have reviewed the triage vital signs and the nursing notes.  Pertinent labs & imaging results that were available during my care of the patient were reviewed by me and considered in my medical decision making (see chart for details).        Pt with right elbow pain, and significant osseous spurring on XR.  NV intact.  No concerning symptoms for cellulitis or septic joint.  Pt agrees to tx plan with NSAID and close orthopedic f/u.      Final Clinical Impressions(s) / ED Diagnoses   Final diagnoses:  Right elbow pain    ED Discharge Orders    None       Pauline Aus, PA-C 08/01/18 6962    Jacalyn Lefevre, MD 08/01/18 1424

## 2018-12-19 NOTE — Progress Notes (Deleted)
Office Visit Note  Patient: Kristine Montgomery             Date of Birth: [birth year]           MRN: [id number]             PCP: Redmond School, MD Referring: Redmond School, MD Visit Date: 01/01/2019 Occupation: @GUAROCC @  Subjective:  No chief complaint on file.   History of Present Illness: Kristine Montgomery is a 53 y.o. female ***   Activities of Daily Living:  Patient reports morning stiffness for *** {minute/hour:19697}.   Patient {ACTIONS;DENIES/REPORTS:21021675::"Denies"} nocturnal pain.  Difficulty dressing/grooming: {ACTIONS;DENIES/REPORTS:21021675::"Denies"} Difficulty climbing stairs: {ACTIONS;DENIES/REPORTS:21021675::"Denies"} Difficulty getting out of chair: {ACTIONS;DENIES/REPORTS:21021675::"Denies"} Difficulty using hands for taps, buttons, cutlery, and/or writing: {ACTIONS;DENIES/REPORTS:21021675::"Denies"}  No Rheumatology ROS completed.   PMFS History:  Patient Active Problem List   Diagnosis Date Noted  . Urge incontinence 04/10/2018  . OAB (overactive bladder) 04/10/2018  . Urinary frequency 04/10/2018    Past Medical History:  Diagnosis Date  . Chronic bronchitis ([place])   . Edema extremities   . Ovarian cyst     Family History  Problem Relation Age of Onset  . Cancer Paternal Grandfather        colon  . Cancer Maternal Grandmother        lung  . COPD Father   . Non-Hodgkin's lymphoma Mother   . Diabetes Mother   . Cancer Mother        cervical  . Congestive Heart Failure Mother   . Hypertension Sister   . Other Son        fluid around heart   Past Surgical History:  Procedure Laterality Date  . CESAREAN SECTION    . TONSILLECTOMY    . TUBAL LIGATION     Social History   Social History Narrative  . Not on file    There is no immunization history on file for this patient.   Objective: Vital Signs: There were no vitals taken for this visit.   Physical Exam   Musculoskeletal Exam: ***  CDAI Exam: CDAI Score: - Patient Global:  -; Provider Global: - Swollen: -; Tender: - Joint Exam   No joint exam has been documented for this visit   There is currently no information documented on the homunculus. Go to the Rheumatology activity and complete the homunculus joint exam.  Investigation: No additional findings.  Imaging: No results found.  Recent Labs: Lab Results  Component Value Date   WBC 15.3 (H) 08/05/2016   HGB 14.2 08/05/2016   PLT 225 08/05/2016   NA 138 08/05/2016   K 4.0 08/05/2016   CL 105 08/05/2016   CO2 27 08/05/2016   GLUCOSE 89 08/05/2016   BUN 15 08/05/2016   CREATININE 0.56 08/05/2016   BILITOT 0.3 08/05/2016   ALKPHOS 92 08/05/2016   AST 15 08/05/2016   ALT 11 (L) 08/05/2016   PROT 7.9 08/05/2016   ALBUMIN 3.8 08/05/2016   CALCIUM 9.2 08/05/2016   GFRAA >60 08/05/2016    Speciality Comments: No specialty comments available.  Procedures:  No procedures performed Allergies: Patient has no known allergies.   Assessment / Plan:     Visit Diagnoses: Rheumatoid factor positive - 10/17/18: RF 260.8, sed rate 31, ANA negative, lyme ab-, TSH 3.63, vitamin B12 379  OAB (overactive bladder)  Urge incontinence  Orders: No orders of the defined types were placed in this encounter.  No orders of the defined types were placed in this  encounter.   Face-to-face time spent with patient was *** minutes. Greater than 50% of time was spent in counseling and coordination of care.  Follow-Up Instructions: No follow-ups on file.   Ofilia Neas, PA-C  Note - This record has been created using Dragon software.  Chart creation errors have been sought, but may not always  have been located. Such creation errors do not reflect on  the standard of medical care.

## 2019-01-01 ENCOUNTER — Ambulatory Visit: Payer: Self-pay | Admitting: Rheumatology

## 2019-01-23 ENCOUNTER — Ambulatory Visit: Payer: Self-pay | Admitting: Rheumatology

## 2019-05-30 ENCOUNTER — Ambulatory Visit: Payer: Self-pay | Admitting: Orthopedic Surgery

## 2019-05-31 ENCOUNTER — Other Ambulatory Visit: Payer: Self-pay

## 2019-05-31 ENCOUNTER — Ambulatory Visit: Payer: Self-pay | Attending: Internal Medicine

## 2019-05-31 DIAGNOSIS — Z20822 Contact with and (suspected) exposure to covid-19: Secondary | ICD-10-CM | POA: Insufficient documentation

## 2019-06-02 ENCOUNTER — Emergency Department (HOSPITAL_COMMUNITY)
Admission: EM | Admit: 2019-06-02 | Discharge: 2019-06-02 | Disposition: A | Discharge status: Home / Self Care | Payer: Self-pay | Attending: Emergency Medicine | Admitting: Emergency Medicine

## 2019-06-02 ENCOUNTER — Emergency Department (HOSPITAL_COMMUNITY): Payer: Self-pay

## 2019-06-02 ENCOUNTER — Other Ambulatory Visit: Payer: Self-pay

## 2019-06-02 ENCOUNTER — Encounter (HOSPITAL_COMMUNITY): Payer: Self-pay | Admitting: Emergency Medicine

## 2019-06-02 DIAGNOSIS — F17210 Nicotine dependence, cigarettes, uncomplicated: Secondary | ICD-10-CM | POA: Insufficient documentation

## 2019-06-02 DIAGNOSIS — M5412 Radiculopathy, cervical region: Secondary | ICD-10-CM | POA: Insufficient documentation

## 2019-06-02 DIAGNOSIS — Z79899 Other long term (current) drug therapy: Secondary | ICD-10-CM | POA: Insufficient documentation

## 2019-06-02 LAB — I-STAT CHEM 8, ED
BUN: 13 mg/dL (ref 6–20)
Calcium, Ion: 1.2 mmol/L (ref 1.15–1.40)
Chloride: 104 mmol/L (ref 98–111)
Creatinine, Ser: 0.4 mg/dL — ABNORMAL LOW (ref 0.44–1.00)
Glucose, Bld: 95 mg/dL (ref 70–99)
HCT: 40 % (ref 36.0–46.0)
Hemoglobin: 13.6 g/dL (ref 12.0–15.0)
Potassium: 3.8 mmol/L (ref 3.5–5.1)
Sodium: 140 mmol/L (ref 135–145)
TCO2: 26 mmol/L (ref 22–32)

## 2019-06-02 LAB — NOVEL CORONAVIRUS, NAA: SARS-CoV-2, NAA: NOT DETECTED

## 2019-06-02 MED ORDER — IBUPROFEN 800 MG PO TABS: 800.0000 mg | ORAL_TABLET | Freq: Three times a day (TID) | ORAL | 0 refills | Status: DC

## 2019-06-02 MED ORDER — METHOCARBAMOL 500 MG PO TABS: 500.0000 mg | ORAL_TABLET | Freq: Three times a day (TID) | ORAL | 0 refills | Status: DC

## 2019-06-02 MED ORDER — IBUPROFEN 800 MG PO TABS
800.0000 mg | ORAL_TABLET | Freq: Once | ORAL | Status: AC
  Administered 2019-06-02: 13:00:00 800 mg via ORAL
  Filled 2019-06-02: qty 1

## 2019-06-02 NOTE — ED Notes (Signed)
Pt reports neck pain for 1 week down to her fingers of her L habd   NAD   Moves arm ad lib  Has not called PCP nor sought opinion in the last wek   Here for eval

## 2019-06-02 NOTE — ED Provider Notes (Signed)
[name]   CSN: [id number] Arrival date & time: 06/02/19  1042     History Chief Complaint  Patient presents with  . Neck Pain    Kristine Montgomery is a 54 y.o. female.  HPI      Kristine Montgomery is a 54 y.o. female who presents to the Emergency Department complaining of pain to her lower neck that radiates down her left arm.  Symptoms have been present for one week.  Her job requires her to use her hands frequently and look down most of the day.  Symptoms were gradual in onset.  She describes a sharp pain that originates in her lower neck and radiates to the fingers of her left hand.  Pain is associated with rotation and flexion of her neck, and movement of her left arm especially with reaching or attempting to comb her hair.  She also endorses having pain to her hand with gripping.  She denies known injury, numbness or weakness, chest pain, shortness of breath, dizziness, headache or visual changes.  She has tried tylenol and muscle rub with no significant improvement.      Past Medical History:  Diagnosis Date  . Chronic bronchitis ([place])   . Edema extremities   . Ovarian cyst     Patient Active Problem List   Diagnosis Date Noted  . Urge incontinence 04/10/2018  . OAB (overactive bladder) 04/10/2018  . Urinary frequency 04/10/2018    Past Surgical History:  Procedure Laterality Date  . CESAREAN SECTION    . TONSILLECTOMY    . TUBAL LIGATION       OB History    Gravida  1   Para  1   Term  1   Preterm      AB      Living  2     SAB      TAB      Ectopic      Multiple  1   Live Births  2           Family History  Problem Relation Age of Onset  . Cancer Paternal Grandfather        colon  . Cancer Maternal Grandmother        lung  . COPD Father   . Non-Hodgkin's lymphoma Mother   . Diabetes Mother   . Cancer Mother        cervical  . Congestive Heart Failure Mother   . Hypertension Sister   . Other  Son        fluid around heart    Social History   Tobacco Use  . Smoking status: Current Every Day Smoker    Packs/day: 0.50    Years: 30.00    Pack years: 15.00    Types: Cigarettes  . Smokeless tobacco: Never Used  Substance Use Topics  . Alcohol use: Yes    Comment: occasional  . Drug use: No    Home Medications Prior to Admission medications   Medication Sig Start Date End Date Taking? Authorizing Provider  albuterol (PROVENTIL HFA;VENTOLIN HFA) 108 (90 Base) MCG/ACT inhaler Inhale 2 puffs into the lungs every 4 (four) hours as needed for wheezing or shortness of breath. Patient not taking: Reported on 04/10/2018 02/20/18   Noemi Chapel, MD  amoxicillin-clavulanate (AUGMENTIN) 875-125 MG tablet Take 1 tablet by mouth 2 (two) times daily.  02/15/18   [provider]  azithromycin (ZITHROMAX Z-PAK) 250 MG tablet Take  1 tablet (250 mg total) by mouth daily. 500mg  PO day 1, then 250mg  PO days 205 Patient not taking: Reported on 04/10/2018 02/20/18   04/12/2018, MD  benzonatate (TESSALON) 100 MG capsule Take 1 capsule (100 mg total) by mouth every 8 (eight) hours. Patient not taking: Reported on 04/10/2018 02/20/18   04/12/2018, MD  diclofenac (VOLTAREN) 75 MG EC tablet Take 1 tablet (75 mg total) by mouth 2 (two) times daily. Take with food 08/01/18   Carlye Panameno, PA-C  HYDROcodone-acetaminophen (NORCO/VICODIN) 5-325 MG tablet Take 1 tablet every 4 hours as needed for pain 02/15/18   [provider]  mirabegron ER (MYRBETRIQ) 25 MG TB24 tablet Take 1 tablet (25 mg total) by mouth daily. 04/10/18   04/17/18, NP  phentermine 37.5 MG capsule Take 37.5 mg by mouth every morning.    [provider]  traMADol (ULTRAM) 50 MG tablet Take 1 tablet (50 mg total) by mouth every 6 (six) hours as needed. 08/01/18   Santosh Petter, PA-C    Allergies    Patient has no known allergies.  Review of Systems   Review of Systems  Constitutional: Negative  for chills and fever.  Eyes: Negative for visual disturbance.  Respiratory: Negative for chest tightness and shortness of breath.   Cardiovascular: Negative for chest pain.  Gastrointestinal: Negative for abdominal pain, nausea and vomiting.  Genitourinary: Negative for dysuria.  Musculoskeletal: Positive for neck pain. Negative for arthralgias, joint swelling and neck stiffness.  Skin: Negative for color change and rash.  Neurological: Negative for dizziness, syncope, speech difficulty, weakness, numbness and headaches.  Psychiatric/Behavioral: Negative for confusion.    Physical Exam Updated Vital Signs BP (!) 173/91 (BP Location: Right Arm)   Pulse 83   Temp (!) 97 F (36.1 C) (Oral)   Resp 16   Ht 5\' 1"  (1.549 m)   Wt 120.2 kg   SpO2 97%   BMI 50.07 kg/m   Physical Exam Vitals and nursing note reviewed.  Constitutional:      Appearance: Normal appearance. She is not ill-appearing.  HENT:     Head: Atraumatic.  Eyes:     Extraocular Movements: Extraocular movements intact.     Pupils: Pupils are equal, round, and reactive to light.  Neck:     Trachea: Phonation normal.  Cardiovascular:     Rate and Rhythm: Normal rate and regular rhythm.     Pulses: Normal pulses.  Pulmonary:     Effort: Pulmonary effort is normal.     Breath sounds: Normal breath sounds.  Chest:     Chest wall: No tenderness.  Musculoskeletal:        General: No swelling.     Right hand: Normal range of motion. There is no disruption of two-point discrimination. Normal capillary refill.     Left hand: Normal range of motion. There is no disruption of two-point discrimination. Normal capillary refill.       Arms:     Cervical back: Normal range of motion. Tenderness present. Pain with movement, spinous process tenderness and muscular tenderness present.     Comments: Diffuse ttp at base of the cervical spine and to the left trapezius muscles.  Pain reproduced with abduction of the left arm.  No  bony step offs.  Nml finger abduction bilaterally, nml wrist flexion and extension.   Lymphadenopathy:     Cervical: No cervical adenopathy.  Skin:    General: Skin is warm.     Capillary  Refill: Capillary refill takes less than 2 seconds.     Findings: No erythema or rash.  Neurological:     General: No focal deficit present.     Mental Status: She is alert.     Sensory: Sensation is intact. No sensory deficit.     Motor: No weakness.     Coordination: Coordination is intact.     ED Results / Procedures / Treatments   Labs (all labs ordered are listed, but only abnormal results are displayed) Labs Reviewed  I-STAT CHEM 8, ED - Abnormal; Notable for the following components:      Result Value   Creatinine, Ser 0.40 (*)    All other components within normal limits    EKG None  Radiology DG Cervical Spine Complete  Result Date: 06/02/2019 CLINICAL DATA:  Neck pain which began 1 week ago. EXAM: CERVICAL SPINE - COMPLETE 4+ VIEW COMPARISON:  None. FINDINGS: There is no evidence of cervical spine fracture or prevertebral soft tissue swelling. Alignment is normal. Moderate osteoarthritic changes at C5-C6 and C6-C7, mild osteoarthritic changes at C4-C5 with degenerative disc disease and mild vertebral body remodeling. Anterior osteophytes also present. IMPRESSION: Moderate osteoarthritic changes at C5-C6 and C6-C7, and mild osteoarthritic changes at C4-C5. Electronically Signed   By: Ted Mcalpine M.D.   On: 06/02/2019 11:45    Procedures Procedures (including critical care time)  Medications Ordered in ED Medications - No data to display  ED Course  I have reviewed the triage vital signs and the nursing notes.  Pertinent labs & imaging results that were available during my care of the patient were reviewed by me and considered in my medical decision making (see chart for details).    MDM Rules/Calculators/A&P                      Pateint has one week hx of cervical  radicular pain that is exacerbated with movement and easily reproduced.  No focal neurological deficits, no motor weakness on exam.  No chest pain, dyspnea.  No concerning sx's for ACS.  Sx's felt to be musculoskeletal.  Kidney function is reassuring, so I will prescribe NSAID and muscle relaxer, she agrees to PCP or orthopedic f/u, return precautions also discussed.    Final Clinical Impression(s) / ED Diagnoses Final diagnoses:  Cervical radicular pain    Rx / DC Orders ED Discharge Orders    None       Pauline Aus, PA-C 06/02/19 1651    Blane Ohara, MD 06/04/19 1311

## 2019-06-02 NOTE — ED Triage Notes (Signed)
Patient complains of neck pain that began a week ago. She reports that it runs down her left arm to her fingertips.

## 2019-06-02 NOTE — Discharge Instructions (Signed)
Alternate ice and heat to your neck.  Follow-up with your primary doctor or with the orthopedic provider listed in one week if the symptoms are not improving

## 2019-08-18 ENCOUNTER — Emergency Department (HOSPITAL_COMMUNITY): Payer: Self-pay

## 2019-08-18 ENCOUNTER — Emergency Department (HOSPITAL_COMMUNITY)
Admission: EM | Admit: 2019-08-18 | Discharge: 2019-08-18 | Disposition: A | Discharge status: Home / Self Care | Payer: Self-pay | Attending: Emergency Medicine | Admitting: Emergency Medicine

## 2019-08-18 ENCOUNTER — Encounter (HOSPITAL_COMMUNITY): Payer: Self-pay | Admitting: Emergency Medicine

## 2019-08-18 ENCOUNTER — Other Ambulatory Visit: Payer: Self-pay

## 2019-08-18 DIAGNOSIS — F17210 Nicotine dependence, cigarettes, uncomplicated: Secondary | ICD-10-CM | POA: Insufficient documentation

## 2019-08-18 DIAGNOSIS — Z79899 Other long term (current) drug therapy: Secondary | ICD-10-CM | POA: Insufficient documentation

## 2019-08-18 DIAGNOSIS — M25561 Pain in right knee: Secondary | ICD-10-CM | POA: Insufficient documentation

## 2019-08-18 MED ORDER — HYDROCODONE-ACETAMINOPHEN 5-325 MG PO TABS: ORAL_TABLET | ORAL | 0 refills | Status: DC

## 2019-08-18 MED ORDER — OXYCODONE-ACETAMINOPHEN 5-325 MG PO TABS
1.0000 | ORAL_TABLET | Freq: Once | ORAL | Status: AC
  Administered 2019-08-18: 1 via ORAL
  Filled 2019-08-18: qty 1

## 2019-08-18 MED ORDER — NAPROXEN 500 MG PO TABS: 500.0000 mg | ORAL_TABLET | Freq: Two times a day (BID) | ORAL | 0 refills | Status: DC

## 2019-08-18 NOTE — ED Triage Notes (Signed)
Pain to RT knee that radiates down to foot.  Reports hx of arthritis and stands all the time at her job.

## 2019-08-18 NOTE — Discharge Instructions (Signed)
Wear the brace on your knee as needed for support weightbearing.  You may remove for bathing or bedtime.  You may also apply heat or ice on and off to your knee.  Call Dr. Mort Sawyers office to arrange follow-up appointment for next week

## 2019-08-18 NOTE — ED Provider Notes (Signed)
[facility] EMERGENCY DEPARTMENT Provider Note   CSN: [id number] Arrival date & time: 08/18/19  8588     History Chief Complaint  Patient presents with  . Knee Pain    Kristine Montgomery is a 54 y.o. female.  HPI      Kristine Montgomery is a 54 y.o. female who presents to the Emergency Department complaining of gradually worsening right knee pain for several days.  Worse since yesterday.  She describes a throbbing pain to the anterior right knee that at times radiates into her lower leg.  She states that she wears boots and stands all concrete all day at her job.  Pain worsens with bending and standing, improves at rest.  She has tried Tylenol and Advil without relief of symptoms.  She states she had similar symptoms previously in her left knee, but symptoms improved after receiving a cortisone injection.  She denies known injury of her right knee, swelling, redness, fever, chills, numbness or tingling of her lower extremities.    Past Medical History:  Diagnosis Date  . Chronic bronchitis ([facility])   . Edema extremities   . Ovarian cyst     Patient Active Problem List   Diagnosis Date Noted  . Urge incontinence 04/10/2018  . OAB (overactive bladder) 04/10/2018  . Urinary frequency 04/10/2018    Past Surgical History:  Procedure Laterality Date  . CESAREAN SECTION    . TONSILLECTOMY    . TUBAL LIGATION       OB History    Gravida  1   Para  1   Term  1   Preterm      AB      Living  2     SAB      TAB      Ectopic      Multiple  1   Live Births  2           Family History  Problem Relation Age of Onset  . Cancer Paternal Grandfather        colon  . Cancer Maternal Grandmother        lung  . COPD Father   . Non-Hodgkin's lymphoma Mother   . Diabetes Mother   . Cancer Mother        cervical  . Congestive Heart Failure Mother   . Hypertension Sister   . Other Son        fluid around heart    Social History   Tobacco Use  . Smoking status:  Current Every Day Smoker    Packs/day: 0.50    Years: 30.00    Pack years: 15.00    Types: Cigarettes  . Smokeless tobacco: Never Used  Substance Use Topics  . Alcohol use: Yes    Comment: occasional  . Drug use: No    Home Medications Prior to Admission medications   Medication Sig Start Date End Date Taking? Authorizing Provider  albuterol (PROVENTIL HFA;VENTOLIN HFA) 108 (90 Base) MCG/ACT inhaler Inhale 2 puffs into the lungs every 4 (four) hours as needed for wheezing or shortness of breath. Patient not taking: Reported on 04/10/2018 02/20/18   Eber Hong, MD  amoxicillin-clavulanate (AUGMENTIN) 875-125 MG tablet Take 1 tablet by mouth 2 (two) times daily.  02/15/18   [provider]  azithromycin (ZITHROMAX Z-PAK) 250 MG tablet Take 1 tablet (250 mg total) by mouth daily. 500mg  PO day 1, then 250mg  PO days 205 Patient not taking: Reported on 04/10/2018 02/20/18  Eber Hong, MD  benzonatate (TESSALON) 100 MG capsule Take 1 capsule (100 mg total) by mouth every 8 (eight) hours. Patient not taking: Reported on 04/10/2018 02/20/18   Eber Hong, MD  diclofenac (VOLTAREN) 75 MG EC tablet Take 1 tablet (75 mg total) by mouth 2 (two) times daily. Take with food 08/01/18   Jurnee Nakayama, PA-C  HYDROcodone-acetaminophen (NORCO/VICODIN) 5-325 MG tablet Take 1 tablet every 4 hours as needed for pain 02/15/18   [provider]  ibuprofen (ADVIL) 800 MG tablet Take 1 tablet (800 mg total) by mouth 3 (three) times daily. 06/02/19   Hallee Mckenny, PA-C  methocarbamol (ROBAXIN) 500 MG tablet Take 1 tablet (500 mg total) by mouth 3 (three) times daily. 06/02/19   Anastasya Jewell, PA-C  mirabegron ER (MYRBETRIQ) 25 MG TB24 tablet Take 1 tablet (25 mg total) by mouth daily. 04/10/18   Adline Potter, NP  phentermine 37.5 MG capsule Take 37.5 mg by mouth every morning.    [provider]  traMADol (ULTRAM) 50 MG tablet Take 1 tablet (50 mg total) by mouth every 6  (six) hours as needed. 08/01/18   Sherril Shipman, PA-C    Allergies    Patient has no known allergies.  Review of Systems   Review of Systems  Constitutional: Negative for chills and fever.  Respiratory: Negative for cough and shortness of breath.   Cardiovascular: Negative for chest pain.  Gastrointestinal: Negative for abdominal pain, nausea and vomiting.  Musculoskeletal: Positive for arthralgias (right knee pain). Negative for joint swelling.  Skin: Negative for color change and wound.  Neurological: Negative for weakness and numbness.    Physical Exam Updated Vital Signs BP (!) 171/76 (BP Location: Right Arm)   Pulse 70   Temp 97.9 F (36.6 C) (Oral)   Resp 18   Ht 5\' 1"  (1.549 m)   Wt 131.5 kg   SpO2 97%   BMI 54.80 kg/m   Physical Exam Vitals and nursing note reviewed.  Constitutional:      Appearance: Normal appearance. She is not ill-appearing.  Cardiovascular:     Rate and Rhythm: Normal rate and regular rhythm.     Pulses: Normal pulses.  Pulmonary:     Effort: Pulmonary effort is normal.  Abdominal:     Tenderness: There is no abdominal tenderness.  Musculoskeletal:        General: Tenderness present. No swelling or signs of injury.     Right knee: Crepitus present. No swelling or effusion. Normal range of motion. Tenderness present over the medial joint line. No patellar tendon tenderness. No LCL laxity or MCL laxity.     Right lower leg: No edema.     Comments: Tenderness to palpation of the medial aspect of the right patella.  Mild crepitus on range of motion.  Pain reproduced on valgus stress.  Neg drawer sign.  No erythema or palpable effusion  Skin:    General: Skin is warm.     Capillary Refill: Capillary refill takes less than 2 seconds.     Findings: No erythema.  Neurological:     General: No focal deficit present.     Mental Status: She is alert.     ED Results / Procedures / Treatments   Labs (all labs ordered are listed, but only  abnormal results are displayed) Labs Reviewed - No data to display  EKG None  Radiology DG Knee Complete 4 Views Right  Result Date: 08/18/2019 CLINICAL DATA:  Pain EXAM: RIGHT  KNEE - COMPLETE 4+ VIEW COMPARISON:  March 28, 2013 FINDINGS: Frontal, lateral, and bilateral oblique views were obtained. There is no appreciable fracture or dislocation. There is a small joint effusion. There is moderate narrowing of the patellofemoral joint. Other joint spaces appear normal. There is mild spurring in all compartments. IMPRESSION: Small joint effusion. No fracture or dislocation. Moderate narrowing patellofemoral joint with mild spurring in all compartments. Electronically Signed   By: Lowella Grip III M.D.   On: 08/18/2019 10:42    Procedures Procedures (including critical care time)  Medications Ordered in ED Medications  oxyCODONE-acetaminophen (PERCOCET/ROXICET) 5-325 MG per tablet 1 tablet (has no administration in time range)    ED Course  I have reviewed the triage vital signs and the nursing notes.  Pertinent labs & imaging results that were available during my care of the patient were reviewed by me and considered in my medical decision making (see chart for details).    MDM Rules/Calculators/A&P                      Patient presents with pain of her right knee without acute injury.  Pain reported after standing and walking on hard floors at her job.  She is neurovascularly intact.  No findings to suggest septic joint.  Neurovascularly intact.  No palpable effusion.  X-ray of the knee was obtained and shows small joint effusion with narrowing at the patellofemoral joint and spurring in all compartments.  Symptoms are felt to be inflammatory.  Knee brace was given for comfort.  Patient agrees to orthopedic follow-up and prefers follow-up with Dr. Ruthe Mannan office.   Final Clinical Impression(s) / ED Diagnoses Final diagnoses:  Acute pain of right knee    Rx / DC  Orders ED Discharge Orders    None       Kem Parkinson, PA-C 08/18/19 1143    Noemi Chapel, MD 08/21/19 [id number]

## 2019-08-22 ENCOUNTER — Emergency Department (HOSPITAL_COMMUNITY)
Admission: EM | Admit: 2019-08-22 | Discharge: 2019-08-22 | Disposition: A | Discharge status: Home / Self Care | Payer: Self-pay | Attending: Emergency Medicine | Admitting: Emergency Medicine

## 2019-08-22 ENCOUNTER — Other Ambulatory Visit: Payer: Self-pay

## 2019-08-22 ENCOUNTER — Encounter (HOSPITAL_COMMUNITY): Payer: Self-pay

## 2019-08-22 DIAGNOSIS — M778 Other enthesopathies, not elsewhere classified: Secondary | ICD-10-CM | POA: Insufficient documentation

## 2019-08-22 DIAGNOSIS — X503XXA Overexertion from repetitive movements, initial encounter: Secondary | ICD-10-CM | POA: Insufficient documentation

## 2019-08-22 DIAGNOSIS — F17210 Nicotine dependence, cigarettes, uncomplicated: Secondary | ICD-10-CM | POA: Insufficient documentation

## 2019-08-22 NOTE — Discharge Instructions (Signed)
Apply ice to the elbow where it hurts the most for 20 minutes at a time several times a day Use sling or elbow sleeve to help support and provide compression to the elbow You can apply Voltaren gel or take Naproxen for pain Please follow up with your doctor or Orthopedics

## 2019-08-22 NOTE — ED Provider Notes (Signed)
[facility] Provider Note   CSN: [id number] Arrival date & time: 08/22/19  1330     History Chief Complaint  Patient presents with  . Elbow Pain    Kristine Montgomery is a 54 y.o. female who presents with right elbow pain.  Patient states that the elbow has been hurting her for about 1 day.  She is right-hand dominant.  She states she has had elbow pain before and when diagnosed diagnosed with bone spurs.  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  She denies any trauma to the arm.  The pain is primarily in the center of the elbow in the antecubital fossa radiates down her forearm to her hand.  Her hand feels mildly swollen is difficult to make a fist.  She also has difficulty pronating the arm due to pain.  She has tingling in all of her fingers except for her thumb.  She was here couple days ago for knee arthritis and was prescribed naproxen and Norco.  She is out of the [place].  She is tried Tylenol and ibuprofen for pain without significant relief.  HPI     Past Medical History:  Diagnosis Date  . Chronic bronchitis ([place])   . Edema extremities   . Ovarian cyst     Patient Active Problem List   Diagnosis Date Noted  . Urge incontinence 04/10/2018  . OAB (overactive bladder) 04/10/2018  . Urinary frequency 04/10/2018    Past Surgical History:  Procedure Laterality Date  . CESAREAN SECTION    . TONSILLECTOMY    . TUBAL LIGATION       OB History    Gravida  1   Para  1   Term  1   Preterm      AB      Living  2     SAB      TAB      Ectopic      Multiple  1   Live Births  2           Family History  Problem Relation Age of Onset  . Cancer Paternal Grandfather        colon  . Cancer Maternal Grandmother        lung  . COPD Father   . Non-Hodgkin's lymphoma Mother   . Diabetes Mother   . Cancer Mother        cervical  . Congestive Heart Failure Mother   . Hypertension  Sister   . Other Son        fluid around heart    Social History   Tobacco Use  . Smoking status: Current Every Day Smoker    Packs/day: 0.50    Years: 30.00    Pack years: 15.00    Types: Cigarettes  . Smokeless tobacco: Never Used  Substance Use Topics  . Alcohol use: Yes    Comment: occasional  . Drug use: No    Home Medications Prior to Admission medications   Medication Sig Start Date End Date Taking? Authorizing Provider  gabapentin (NEURONTIN) 100 MG capsule Take 100 mg by mouth 3 (three) times daily as needed (nerve pain).  06/07/19   [provider]  HYDROcodone-acetaminophen (NORCO/VICODIN) 5-325 MG tablet Take one tab po q 4 hrs prn pain 08/18/19   Triplett, Tammy, PA-C  methocarbamol (ROBAXIN) 500 MG tablet Take 1 tablet (500 mg total) by mouth 3 (three)  times daily. 06/02/19   Triplett, Tammy, PA-C  naproxen (NAPROSYN) 500 MG tablet Take 1 tablet (500 mg total) by mouth 2 (two) times daily with a meal. 08/18/19   Triplett, Tammy, PA-C    Allergies    Patient has no known allergies.  Review of Systems   Review of Systems  Musculoskeletal: Positive for arthralgias and myalgias.  Neurological: Positive for numbness. Negative for weakness.    Physical Exam Updated Vital Signs BP (!) 176/81 (BP Location: Left Arm)   Pulse 82   Temp 98.1 F (36.7 C) (Oral)   Resp 16   Ht 5\' 2"  (1.575 m)   Wt 127 kg   SpO2 96%   BMI 51.21 kg/m   Physical Exam Vitals and nursing note reviewed.  Constitutional:      General: She is not in acute distress.    Appearance: She is well-developed. She is obese.  HENT:     Head: Normocephalic and atraumatic.  Eyes:     General: No scleral icterus.       Right eye: No discharge.        Left eye: No discharge.     Conjunctiva/sclera: Conjunctivae normal.     Pupils: Pupils are equal, round, and reactive to light.  Cardiovascular:     Rate and Rhythm: Normal rate.  Pulmonary:     Effort: Pulmonary effort is normal. No  respiratory distress.  Abdominal:     General: There is no distension.  Musculoskeletal:     Cervical back: Normal range of motion.     Comments: Right elbow: No swelling or deformity.  Tenderness over the distal biceps tendon and lateral epicondyle.  Full range of motion of the elbow.  Pain with supination and pronation of the hand.  2+ radial pulse.  Patient is able to make a fist.  Skin:    General: Skin is warm and dry.  Neurological:     Mental Status: She is alert and oriented to person, place, and time.  Psychiatric:        Behavior: Behavior normal.     ED Results / Procedures / Treatments   Labs (all labs ordered are listed, but only abnormal results are displayed) Labs Reviewed - No data to display  EKG None  Radiology No results found.  Procedures Procedures (including critical care time)  Medications Ordered in ED Medications - No data to display  ED Course  I have reviewed the triage vital signs and the nursing notes.  Pertinent labs & imaging results that were available during my care of the patient were reviewed by me and considered in my medical decision making (see chart for details).  54 year old female with atraumatic right elbow pain.  Exam is consistent with biceps tendinitis and lateral epicondylitis.  Imaging is not indicated.  Recommend rest, ice, compression elevation as well as Voltaren gel and naproxen which she was recently prescribed.  Advised follow-up with her PCP or orthopedics.  MDM Rules/Calculators/A&P                       Final Clinical Impression(s) / ED Diagnoses Final diagnoses:  Elbow tendonitis    Rx / DC Orders ED Discharge Orders    None       56, PA-C 08/22/19 1556    10/22/19, MD 08/24/19 1147

## 2019-08-22 NOTE — ED Triage Notes (Signed)
Pt to er, pt states that she has a spur on her R elbow, states that she was working in a [employer], quantity and her R arm now hurts and she can't mover her arm.  States that she has had something similar before.

## 2019-10-05 ENCOUNTER — Emergency Department (HOSPITAL_COMMUNITY): Payer: Self-pay

## 2019-10-05 ENCOUNTER — Encounter (HOSPITAL_COMMUNITY): Payer: Self-pay | Admitting: *Deleted

## 2019-10-05 ENCOUNTER — Emergency Department (HOSPITAL_COMMUNITY)
Admission: EM | Admit: 2019-10-05 | Discharge: 2019-10-05 | Disposition: A | Discharge status: Home / Self Care | Payer: Self-pay | Attending: Emergency Medicine | Admitting: Emergency Medicine

## 2019-10-05 ENCOUNTER — Other Ambulatory Visit: Payer: Self-pay

## 2019-10-05 DIAGNOSIS — Z79899 Other long term (current) drug therapy: Secondary | ICD-10-CM | POA: Insufficient documentation

## 2019-10-05 DIAGNOSIS — M79605 Pain in left leg: Secondary | ICD-10-CM | POA: Insufficient documentation

## 2019-10-05 DIAGNOSIS — F17210 Nicotine dependence, cigarettes, uncomplicated: Secondary | ICD-10-CM | POA: Insufficient documentation

## 2019-10-05 MED ORDER — OXYCODONE-ACETAMINOPHEN 5-325 MG PO TABS
1.0000 | ORAL_TABLET | Freq: Once | ORAL | Status: AC
  Administered 2019-10-05: 1 via ORAL
  Filled 2019-10-05: qty 1

## 2019-10-05 MED ORDER — METHOCARBAMOL 500 MG PO TABS: 500.0000 mg | ORAL_TABLET | Freq: Three times a day (TID) | ORAL | 0 refills | Status: DC | PRN

## 2019-10-05 MED ORDER — NAPROXEN 500 MG PO TABS: 500.0000 mg | ORAL_TABLET | Freq: Two times a day (BID) | ORAL | 0 refills | Status: DC

## 2019-10-05 NOTE — ED Notes (Addendum)
Here for l leg pain   Has not sought eval prior to today   Reports pain for the last 3 weeks Has not seen Dr Sherwood Gambler "I don't have the money. I'm out of a job"  Complains of pain to her L knee behind the knee Cries with pain prior to nurse touching her posterior knee  Here for eval

## 2019-10-05 NOTE — ED Provider Notes (Signed)
[facility] EMERGENCY DEPARTMENT Provider Note   CSN: [id number] Arrival date & time: 10/05/19  1056     History Chief Complaint  Patient presents with  . Leg Pain    ELSE HABERMANN is a 54 y.o. female with a history of tobacco abuse, extremity edema, and chronic bronchitis who presents to the emergency department with complaints of LLE pain x 2-3 weeks. Patient states pain is primarily to the posterior L knee & radiates into the thigh and calf. Discomfort is constant, severe, worse with movement, no alleviating factors. Tried tylenol, ibuprofen and leftover norco without much relief. She reports associated LLE swelling. Denies injury or change in activity. Denies fever, chills, redness, warmth, numbness, tingling, or weakness. Denies chest pain, dyspnea, hemoptysis, recent surgery/trauma, recent long travel, hormone use, personal hx of cancer, or hx of DVT/PE.   HPI     Past Medical History:  Diagnosis Date  . Chronic bronchitis (HCC)   . Edema extremities   . Ovarian cyst     Patient Active Problem List   Diagnosis Date Noted  . Urge incontinence 04/10/2018  . OAB (overactive bladder) 04/10/2018  . Urinary frequency 04/10/2018    Past Surgical History:  Procedure Laterality Date  . CESAREAN SECTION    . TONSILLECTOMY    . TUBAL LIGATION       OB History    Gravida  1   Para  1   Term  1   Preterm      AB      Living  2     SAB      TAB      Ectopic      Multiple  1   Live Births  2           Family History  Problem Relation Age of Onset  . Cancer Paternal Grandfather        colon  . Cancer Maternal Grandmother        lung  . COPD Father   . Non-Hodgkin's lymphoma Mother   . Diabetes Mother   . Cancer Mother        cervical  . Congestive Heart Failure Mother   . Hypertension Sister   . Other Son        fluid around heart    Social History   Tobacco Use  . Smoking status: Current Every Day Smoker    Packs/day: 0.50    Years:  30.00    Pack years: 15.00    Types: Cigarettes  . Smokeless tobacco: Never Used  Substance Use Topics  . Alcohol use: Yes    Comment: occasional  . Drug use: No    Home Medications Prior to Admission medications   Medication Sig Start Date End Date Taking? Authorizing Provider  gabapentin (NEURONTIN) 100 MG capsule Take 100 mg by mouth 3 (three) times daily as needed (nerve pain).  06/07/19   [provider]  HYDROcodone-acetaminophen (NORCO/VICODIN) 5-325 MG tablet Take one tab po q 4 hrs prn pain 08/18/19   Triplett, Tammy, PA-C  methocarbamol (ROBAXIN) 500 MG tablet Take 1 tablet (500 mg total) by mouth 3 (three) times daily. 06/02/19   Triplett, Tammy, PA-C  naproxen (NAPROSYN) 500 MG tablet Take 1 tablet (500 mg total) by mouth 2 (two) times daily with a meal. 08/18/19   Triplett, Tammy, PA-C    Allergies    Patient has no known allergies.  Review of Systems   Review of Systems  Constitutional: Negative for chills and fever.  Respiratory: Negative for shortness of breath.   Cardiovascular: Positive for leg swelling. Negative for chest pain.  Gastrointestinal: Negative for abdominal pain, nausea and vomiting.  Musculoskeletal: Positive for arthralgias, joint swelling and myalgias.  Neurological: Negative for syncope, weakness and numbness.  All other systems reviewed and are negative.   Physical Exam Updated Vital Signs BP (!) 134/59   Pulse (!) 58   Temp 98 F (36.7 C)   Resp 20   Ht 5\' 1"  (1.549 m)   Wt 117.9 kg   SpO2 96%   BMI 49.13 kg/m   Physical Exam Vitals and nursing note reviewed.  Constitutional:      General: She is not in acute distress.    Appearance: She is well-developed. She is not ill-appearing or toxic-appearing.  HENT:     Head: Normocephalic and atraumatic.  Eyes:     General:        Right eye: No discharge.        Left eye: No discharge.     Conjunctiva/sclera: Conjunctivae normal.  Cardiovascular:     Rate and Rhythm: Normal  rate and regular rhythm.     Pulses:          Dorsalis pedis pulses are 2+ on the right side and 2+ on the left side.       Posterior tibial pulses are 2+ on the right side and 2+ on the left side.  Pulmonary:     Effort: Pulmonary effort is normal. No respiratory distress.     Breath sounds: Normal breath sounds. No wheezing, rhonchi or rales.  Abdominal:     General: There is no distension.     Palpations: Abdomen is soft.     Tenderness: There is no abdominal tenderness.  Musculoskeletal:     Cervical back: Neck supple.     Comments: Lower extremities: Patient has trace symmetric pitting edema to the bilateral lower legs.  She has some mild swelling noted to the left knee.  No erythema, warmth, or pallor noted.  No significant open wounds.  Patient has intact active range of motion throughout the right lower extremity.  Left lower extremity active range of motion intact with the exception of left knee flexion limited to about 90 degrees.  Patient is tender most primarily to the posterior aspect of the left knee as well as to the left calf area. compartments are soft.  Otherwise nontender.  Skin:    General: Skin is warm and dry.     Capillary Refill: Capillary refill takes less than 2 seconds.     Findings: No rash.  Neurological:     Mental Status: She is alert.     Comments: Alert. Clear speech. Sensation grossly intact to bilateral lower extremities. 5/5 strength with plantar/dorsiflexion bilaterally.   Psychiatric:        Mood and Affect: Mood normal.        Behavior: Behavior normal.     ED Results / Procedures / Treatments   Labs (all labs ordered are listed, but only abnormal results are displayed) Labs Reviewed - No data to display  EKG None  Radiology Venous Img Lower  Left (DVT Study)  Result Date: 10/05/2019 CLINICAL DATA:  Left leg pain for the past 3 weeks in the region of the anterior aspect of the knee. EXAM: LEFT LOWER EXTREMITY VENOUS DOPPLER ULTRASOUND  TECHNIQUE: Gray-scale sonography with compression, as well as color and duplex ultrasound, were performed  to evaluate the deep venous system(s) from the level of the common femoral vein through the popliteal and proximal calf veins. COMPARISON:  None. FINDINGS: VENOUS Normal compressibility of the common femoral, superficial femoral, and popliteal veins, as well as the visualized calf veins. Visualized portions of profunda femoral vein and great saphenous vein unremarkable. No filling defects to suggest DVT on grayscale or color Doppler imaging. Doppler waveforms show normal direction of venous flow, normal respiratory plasticity and response to augmentation. Limited views of the contralateral common femoral vein are unremarkable. OTHER None. Limitations: none IMPRESSION: Normal examination.  No DVT. Electronically Signed   By: Claudie Revering M.D.   On: 10/05/2019 13:38   DG Knee Complete 4 Views Left  Result Date: 10/05/2019 CLINICAL DATA:  Left knee pain radiating throughout the left lower leg for 3 weeks. No known injury. EXAM: LEFT KNEE - COMPLETE 4+ VIEW COMPARISON:  Plain films left knee 02/15/2018. FINDINGS: No fracture or focal bony lesion. Mild degenerative disease about the knee is most notable medially. Small joint effusion is seen. No chondrocalcinosis. IMPRESSION: No acute bony abnormality. Small joint effusion. No change in mild medial compartment osteoarthritis. Electronically Signed   By: Inge Rise M.D.   On: 10/05/2019 12:51    Procedures Procedures (including critical care time)  Medications Ordered in ED Medications - No data to display  ED Course  I have reviewed the triage vital signs and the nursing notes.  Pertinent labs & imaging results that were available during my care of the patient were reviewed by me and considered in my medical decision making (see chart for details).    MDM Rules/Calculators/A&P                     Patient presents to the ED with complaints  of LLE Pain x 3 wees. Nontoxic, vitals without significant abnormality. Exam with mild L knee swelling, tenderness primarily to posterior L knee into the calf. NVI distally.   Ddx: DVT, muscular strain/spasm, gout, cellulitis, septic joint, arterial emboli, fracture, arthritis.   Additional history obtained:  Additional history obtained from chart review.  Imaging Studies ordered:  I ordered imaging studies which included LLE venous duplex & L knee xray, I independently visualized and interpreted imaging which showed:  L knee x-ray: No acute bony abnormality. Small joint effusion. No change in mild medial compartment osteoarthritis.  Venous duplex: Negative exam.   ED Course:  Venous duplex is negative for DVT.  X-rays without fracture, dislocation, or any worsening osteoarthritis, but does have findings of OA.  There is no erythema, warmth, or fevers to raise concern for septic joint or cellulitis.  Lower extremity temperatures are symmetric with 2+ symmetric pulses therefore do not suspect arterial emboli.  Suspect muscular in nature, potentially OA contributing, gout also remains in the differential given majority of pain is in the knee.  Will treat with naproxen and Robaxin, discussed with patient she is not to drive or operate heavy machinery when taking Robaxin.  Will provide orthopedics follow-up. I discussed results, treatment plan, need for follow-up, and return precautions with the patient. Provided opportunity for questions, patient confirmed understanding and is in agreement with plan.   Portions of this note were generated with [employer]. Dictation errors may occur despite best attempts at proofreading.  Final Clinical Impression(s) / ED Diagnoses Final diagnoses:  Left leg pain    Rx / DC Orders ED Discharge Orders  Ordered    naproxen (NAPROSYN) 500 MG tablet  2 times daily     10/05/19 1411    methocarbamol (ROBAXIN) 500 MG tablet  Every 8 hours PRN       10/05/19 1411           Cherly Anderson, PA-C 10/05/19 1426    Gerhard Munch, MD 10/05/19 [phone]

## 2019-10-05 NOTE — Discharge Instructions (Addendum)
Please read and follow all provided instructions.  You have been seen today for left leg pain.   Tests performed today include: An x-ray of the affected area - does NOT show any broken bones or dislocations.  An ultrasound of the affected area- does not show a blood clot. Vital signs. See below for your results today.   Medications:  - Naproxen is a nonsteroidal anti-inflammatory medication that will help with pain and swelling. Be sure to take this medication as prescribed with food, 1 pill every 12 hours,  It should be taken with food, as it can cause stomach upset, and more seriously, stomach bleeding. Do not take other nonsteroidal anti-inflammatory medications with this such as Advil, Motrin, Aleve, Mobic, Goodie Powder, or Motrin.    - Robaxin is the muscle relaxer I have prescribed, this is meant to help with muscle tightness. Be aware that this medication may make you drowsy therefore the first time you take this it should be at a time you are in an environment where you can rest. Do not drive or operate heavy machinery when taking this medication. Do not drink alcohol or take other sedating medications with this medicine such as narcotics or benzodiazepines.   You make take Tylenol per over the counter dosing with these medications.   We have prescribed you new medication(s) today. Discuss the medications prescribed today with your pharmacist as they can have adverse effects and interactions with your other medicines including over the counter and prescribed medications. Seek medical evaluation if you start to experience new or abnormal symptoms after taking one of these medicines, seek care immediately if you start to experience difficulty breathing, feeling of your throat closing, facial swelling, or rash as these could be indications of a more serious allergic reaction   Follow-up instructions: Please follow-up with your primary care provider or with orthopedics within 3 to 5 days for  reevaluation and further management..   Return instructions:  Please return if your digits or extremity are numb or tingling, appear gray or blue, or you have severe pain (also elevate the extremity and loosen splint or wrap if you were given one) Please return if you have redness or fevers.  Please return to the Emergency Department if you experience worsening symptoms.  Please return if you have any other emergent concerns. Additional Information:  Your vital signs today were: BP 128/64   Pulse 66   Temp 98 F (36.7 C)   Resp 20   Ht 5\' 1"  (1.549 m)   Wt 117.9 kg   SpO2 95%   BMI 49.13 kg/m  If your blood pressure (BP) was elevated above 135/85 this visit, please have this repeated by your doctor within one month. ---------------

## 2019-10-05 NOTE — ED Notes (Signed)
To Rad and [place] via stretcher

## 2019-10-05 NOTE — ED Triage Notes (Signed)
Left leg pain for the past 3 weeks, states she is not able to ambulate

## 2019-10-15 ENCOUNTER — Emergency Department (HOSPITAL_COMMUNITY)
Admission: EM | Admit: 2019-10-15 | Discharge: 2019-10-15 | Disposition: A | Discharge status: Home / Self Care | Payer: Self-pay | Attending: Emergency Medicine | Admitting: Emergency Medicine

## 2019-10-15 ENCOUNTER — Other Ambulatory Visit: Payer: Self-pay

## 2019-10-15 ENCOUNTER — Encounter (HOSPITAL_COMMUNITY): Payer: Self-pay | Admitting: *Deleted

## 2019-10-15 DIAGNOSIS — M25562 Pain in left knee: Secondary | ICD-10-CM | POA: Insufficient documentation

## 2019-10-15 DIAGNOSIS — F17210 Nicotine dependence, cigarettes, uncomplicated: Secondary | ICD-10-CM | POA: Insufficient documentation

## 2019-10-15 DIAGNOSIS — Z79899 Other long term (current) drug therapy: Secondary | ICD-10-CM | POA: Insufficient documentation

## 2019-10-15 DIAGNOSIS — G8929 Other chronic pain: Secondary | ICD-10-CM | POA: Insufficient documentation

## 2019-10-15 LAB — SYNOVIAL CELL COUNT + DIFF, W/ CRYSTALS
Crystals, Fluid: NONE SEEN
Eosinophils-Synovial: 0 % (ref 0–1)
Lymphocytes-Synovial Fld: 7 % (ref 0–20)
Monocyte-Macrophage-Synovial Fluid: 2 % — ABNORMAL LOW (ref 50–90)
Neutrophil, Synovial: 91 % — ABNORMAL HIGH (ref 0–25)
WBC, Synovial: 14700 /mm3 — ABNORMAL HIGH (ref 0–200)

## 2019-10-15 MED ORDER — PREDNISONE 20 MG PO TABS
40.0000 mg | ORAL_TABLET | Freq: Every day | ORAL | 0 refills | Status: DC
Start: 2019-10-15 — End: 2019-10-28

## 2019-10-15 MED ORDER — COLCHICINE 0.6 MG PO TABS
1.2000 mg | ORAL_TABLET | ORAL | Status: AC
  Administered 2019-10-15: 1.2 mg via ORAL
  Filled 2019-10-15: qty 2

## 2019-10-15 MED ORDER — LIDOCAINE-EPINEPHRINE (PF) 1 %-1:200000 IJ SOLN
10.0000 mL | Freq: Once | INTRAMUSCULAR | Status: DC
  Filled 2019-10-15: qty 30

## 2019-10-15 NOTE — Discharge Instructions (Signed)
Your knees have arthritis on the xray There is NO sign of gout or infection in the joint. Please take prednisone daily for 5 days See Dr. Romeo Apple this week for follow up. ER for increased swelling / pain / redness or fever of the knee

## 2019-10-15 NOTE — ED Triage Notes (Signed)
Pt c/o left leg pain, states she was here last week and diagnosed with gout, pt states she took all the meds but continues to have pain; pt states she was at work and had to leave due to the pain, it hurts to stand or walk

## 2019-10-15 NOTE — ED Notes (Signed)
Band-aid placed on puncture site and assisted with putting clothes on.  Pt able to stand with partial weight bearing.  Rates pain 10/10 to left knee.

## 2019-10-15 NOTE — ED Provider Notes (Signed)
[facility] EMERGENCY DEPARTMENT Provider Note   CSN: [id number] Arrival date & time: 10/15/19  6834     History Chief Complaint  Patient presents with  . Leg Pain    Kristine Montgomery is a 54 y.o. female.  HPI   This patient is a 54 year old female, she has a history of chronic lower extremity edema as well as arthritis all over her body including her bilateral knees as well as her neck.  She has had severe pain in her left knee for the last month which is much worse in the morning, she is able to walk on it but with significant difficulty.  Significant pain with swelling and difficulty bending the knee.  She has no fevers or chills, she has been evaluated twice for this with imaging over the last month and a half including the beginning of April and again in May on the 21st.  She reports that the pain is gotten worse and not responding naproxen.  She has not had arthrocentesis but did have imaging approximately 10 days ago which showed medial compartment arthritis of the left knee and a small joint effusion.  This is similar to what it looks like a month prior.  She also had an ultrasound of her leg which showed no signs of DVT or Baker's cyst.  Past Medical History:  Diagnosis Date  . Chronic bronchitis ([place])   . Edema extremities   . Ovarian cyst     Patient Active Problem List   Diagnosis Date Noted  . Urge incontinence 04/10/2018  . OAB (overactive bladder) 04/10/2018  . Urinary frequency 04/10/2018    Past Surgical History:  Procedure Laterality Date  . CESAREAN SECTION    . TONSILLECTOMY    . TUBAL LIGATION       OB History    Gravida  1   Para  1   Term  1   Preterm      AB      Living  2     SAB      TAB      Ectopic      Multiple  1   Live Births  2           Family History  Problem Relation Age of Onset  . Cancer Paternal Grandfather        colon  . Cancer Maternal Grandmother        lung  . COPD Father   . Non-Hodgkin's lymphoma  Mother   . Diabetes Mother   . Cancer Mother        cervical  . Congestive Heart Failure Mother   . Hypertension Sister   . Other Son        fluid around heart    Social History   Tobacco Use  . Smoking status: Current Every Day Smoker    Packs/day: 0.50    Years: 30.00    Pack years: 15.00    Types: Cigarettes  . Smokeless tobacco: Never Used  Substance Use Topics  . Alcohol use: Yes    Comment: occasional  . Drug use: No    Home Medications Prior to Admission medications   Medication Sig Start Date End Date Taking? Authorizing Provider  gabapentin (NEURONTIN) 100 MG capsule Take 100 mg by mouth 3 (three) times daily as needed (nerve pain).  06/07/19  Yes [provider]  methocarbamol (ROBAXIN) 500 MG tablet Take 1 tablet (500 mg total) by mouth every 8 (eight)  hours as needed for muscle spasms. 10/05/19  Yes Petrucelli, Samantha R, PA-C  HYDROcodone-acetaminophen (NORCO/VICODIN) 5-325 MG tablet Take one tab po q 4 hrs prn pain Patient not taking: Reported on 10/15/2019 08/18/19   Triplett, Tammy, PA-C  naproxen (NAPROSYN) 500 MG tablet Take 1 tablet (500 mg total) by mouth 2 (two) times daily. Patient not taking: Reported on 10/15/2019 10/05/19   Petrucelli, Pleas Koch, PA-C  predniSONE (DELTASONE) 20 MG tablet Take 2 tablets (40 mg total) by mouth daily. 10/15/19   Eber Hong, MD    Allergies    Patient has no known allergies.  Review of Systems   Review of Systems  Constitutional: Negative for fever.  Musculoskeletal: Positive for arthralgias and joint swelling.  Skin: Negative for rash.    Physical Exam Updated Vital Signs BP (!) 142/67 (BP Location: Right Arm)   Pulse 82   Temp 98.7 F (37.1 C) (Oral)   Resp 16   Ht 1.549 m (5\' 1" )   Wt 117.9 kg   SpO2 99%   BMI 49.13 kg/m   Physical Exam Vitals and nursing note reviewed.  Constitutional:      Appearance: She is well-developed. She is not diaphoretic.  HENT:     Head: Normocephalic and  atraumatic.  Eyes:     General:        Right eye: No discharge.        Left eye: No discharge.     Conjunctiva/sclera: Conjunctivae normal.  Pulmonary:     Effort: Pulmonary effort is normal. No respiratory distress.  Musculoskeletal:        General: Swelling and tenderness present. No deformity.     Right lower leg: Edema present.     Left lower leg: Edema present.     Comments: Normal range of motion of the bilateral hips and ankles, her bilateral knees have pain with range of motion, there is some crepitance felt bilaterally, the right knee has good range of motion, the left knee is significantly restricted secondary to pain, there is a mild effusion palpated clinically.  There is no redness overlying the joint on the left knee.  Bilateral upper extremities with normal range of motion.  Skin:    General: Skin is warm and dry.     Findings: No erythema or rash.  Neurological:     Mental Status: She is alert.     Coordination: Coordination normal.     Comments: Gait is antalgic secondary to knee pain     ED Results / Procedures / Treatments   Labs (all labs ordered are listed, but only abnormal results are displayed) Labs Reviewed  SYNOVIAL CELL COUNT + DIFF, W/ CRYSTALS - Abnormal; Notable for the following components:      Result Value   Appearance-Synovial CLOUDY (*)    WBC, Synovial 14,700 (*)    Neutrophil, Synovial 91 (*)    Monocyte-Macrophage-Synovial Fluid 2 (*)    All other components within normal limits  BODY FLUID CULTURE    EKG None  Radiology No results found.  Procedures .Joint Aspiration/Arthrocentesis  Date/Time: 10/15/2019 10:26 AM Performed by: 10/17/2019, MD Authorized by: Eber Hong, MD   Consent:    Consent obtained:  Written and verbal   Consent given by:  Patient   Risks discussed:  Bleeding, infection, pain, incomplete drainage and nerve damage   Alternatives discussed:  No treatment Location:    Location:  Knee   Knee:  L  knee Anesthesia (see MAR for  exact dosages):    Anesthesia method:  Local infiltration   Local anesthetic:  Lidocaine 1% WITH epi Procedure details:    Preparation: Patient was prepped and draped in usual sterile fashion     Needle gauge:  18 G   Ultrasound guidance: no     Approach:  Lateral   Aspirate amount:  7   Aspirate characteristics:  Yellow   Steroid injected: no     Specimen collected: yes   Post-procedure details:    Dressing:  Adhesive bandage   Patient tolerance of procedure:  Tolerated well, no immediate complications Comments:         (including critical care time)  Medications Ordered in ED Medications  lidocaine-EPINEPHrine (XYLOCAINE-EPINEPHrine) 1 %-1:200000 (PF) injection 10 mL (10 mLs Other See Procedure Record 10/15/19 1037)  colchicine tablet 1.2 mg (1.2 mg Oral Given 10/15/19 1042)    ED Course  I have reviewed the triage vital signs and the nursing notes.  Pertinent labs & imaging results that were available during my care of the patient were reviewed by me and considered in my medical decision making (see chart for details).    MDM Rules/Calculators/A&P                      The patient is afebrile with normal vital signs, she will need arthrocentesis of the knee to further evaluate for the cause of her symptoms, she was told it was gout but is never had gout in the past.  She has not been treated with any other medicines other than an anti-inflammatory and a muscle relaxer which she states did not help.  Agreeable to the plan.  I went over verbally with the patient the risk benefits and alternatives of the procedure and she was agreeable to proceed  Reviewed the medical record including the prior x-ray and ultrasound, arthrocentesis performed and sent for sample.  Patient will be given colchicine pending results.  Results show 14,700 WBC, no organisms and no crystals Try prednisone short course and ortho f/u - pt agreeable. Stable for d/c.  Final  Clinical Impression(s) / ED Diagnoses Final diagnoses:  Chronic pain of left knee    Rx / DC Orders ED Discharge Orders         Ordered    predniSONE (DELTASONE) 20 MG tablet  Daily     10/15/19 1211           Eber Hong, MD 10/15/19 1213

## 2019-10-18 ENCOUNTER — Ambulatory Visit: Payer: Self-pay | Admitting: Orthopaedic Surgery

## 2019-10-18 ENCOUNTER — Other Ambulatory Visit: Payer: Self-pay

## 2019-10-18 ENCOUNTER — Encounter: Payer: Self-pay | Admitting: Orthopaedic Surgery

## 2019-10-18 VITALS — BP 177/89 | HR 74 | Ht 61.0 in | Wt 275.0 lb

## 2019-10-18 DIAGNOSIS — Z6841 Body Mass Index (BMI) 40.0 and over, adult: Secondary | ICD-10-CM

## 2019-10-18 DIAGNOSIS — G8929 Other chronic pain: Secondary | ICD-10-CM

## 2019-10-18 DIAGNOSIS — M25562 Pain in left knee: Secondary | ICD-10-CM

## 2019-10-18 LAB — BODY FLUID CULTURE
Culture: NO GROWTH
Gram Stain: NONE SEEN
Special Requests: NORMAL

## 2019-10-18 NOTE — Progress Notes (Signed)
[name]:EHUDJ LAVONA NORSWORTHY, female DOB:[birth year], 54 y.o. SHF:[id number]  Chief Complaint  Patient presents with  . Knee Pain    left     HPI  Kristine Montgomery is a 54 y.o. female who has continued pain and swelling of the left knee with giving way.  I had asked that she get a MRI of the knee in January of 2020 but she lost her job and lost her insurance.  She has no insurance now. She has had multiple visits for her knee with the most recent in the ER here Monday, May 31st.  She had aspiration of the knee and injection of prednisone.  She has swelling, pain.  She limps.  She is not getting better.  She has not gotten better with rest, ibuprofen, crutches.     Body mass index is 51.96 kg/m.  The patient meets the AMA guidelines for Morbid (severe) obesity with a BMI > 40.0 and I have recommended weight loss.   ROS  Review of Systems  Constitutional: Positive for activity change.  Respiratory: Positive for shortness of breath.   Musculoskeletal: Positive for arthralgias, gait problem and joint swelling.  All other systems reviewed and are negative.   All other systems reviewed and are negative.  The following is a summary of the past history medically, past history surgically, known current medicines, social history and family history.  This information is gathered electronically by the computer from prior information and documentation.  I review this each visit and have found including this information at this point in the chart is beneficial and informative.    Past Medical History:  Diagnosis Date  . Chronic bronchitis ([place])   . Edema extremities   . Ovarian cyst     Past Surgical History:  Procedure Laterality Date  . CESAREAN SECTION    . TONSILLECTOMY    . TUBAL LIGATION      Family History  Problem Relation Age of Onset  . Cancer Paternal Grandfather        colon  . Cancer Maternal Grandmother        lung  . COPD Father   . Non-Hodgkin's lymphoma Mother   .  Diabetes Mother   . Cancer Mother        cervical  . Congestive Heart Failure Mother   . Hypertension Sister   . Other Son        fluid around heart    Social History Social History   Tobacco Use  . Smoking status: Current Every Day Smoker    Packs/day: 0.50    Years: 30.00    Pack years: 15.00    Types: Cigarettes  . Smokeless tobacco: Never Used  Substance Use Topics  . Alcohol use: Yes    Comment: occasional  . Drug use: No    No Known Allergies  Current Outpatient Medications  Medication Sig Dispense Refill  . gabapentin (NEURONTIN) 100 MG capsule Take 100 mg by mouth 3 (three) times daily as needed (nerve pain).     Marland Kitchen HYDROcodone-acetaminophen (NORCO/VICODIN) 5-325 MG tablet Take one tab po q 4 hrs prn pain 8 tablet 0  . methocarbamol (ROBAXIN) 500 MG tablet Take 1 tablet (500 mg total) by mouth every 8 (eight) hours as needed for muscle spasms. 15 tablet 0  . naproxen (NAPROSYN) 500 MG tablet Take 1 tablet (500 mg total) by mouth 2 (two) times daily. 10 tablet 0  . predniSONE (DELTASONE) 20 MG tablet Take 2 tablets (40  mg total) by mouth daily. 10 tablet 0   No current facility-administered medications for this visit.     Physical Exam  Blood pressure (!) 177/89, pulse 74, height 5\' 1"  (1.549 m), weight 275 lb (124.7 kg).  Constitutional: overall normal hygiene, normal nutrition, well developed, normal grooming, normal body habitus. Assistive device:none  Musculoskeletal: gait and station Limp left, muscle tone and strength are normal, no tremors or atrophy is present.  .  Neurological: coordination overall normal.  Deep tendon reflex/nerve stretch intact.  Sensation normal.  Cranial nerves II-XII intact.   Skin:   Normal overall no scars, lesions, ulcers or rashes. No psoriasis.  Psychiatric: Alert and oriented x 3.  Recent memory intact, remote memory unclear.  Normal mood and affect. Well groomed.  Good eye contact.  Cardiovascular: overall no swelling,  no varicosities, no edema bilaterally, normal temperatures of the legs and arms, no clubbing, cyanosis and good capillary refill.  Lymphatic: palpation is normal.  Left knee with effusion, crepitus, ROM 0 to 100, limp left, positive medial [place], NV intact.  All other systems reviewed and are negative   The patient has been educated about the nature of the problem(s) and counseled on treatment options.  The patient appeared to understand what I have discussed and is in agreement with it.  Encounter Diagnoses  Name Primary?  . Chronic pain of left knee Yes  . Body mass index 50.0-59.9, adult (HCC)   . Morbid obesity (HCC)     PLAN Call if any problems.  Precautions discussed.  Continue current medications.   Return to clinic 2 weeks   Get MRI.  Electronically Signed 09-12-1994, MD 6/3/20218:27 AM

## 2019-10-28 ENCOUNTER — Other Ambulatory Visit: Payer: Self-pay

## 2019-10-28 ENCOUNTER — Emergency Department (HOSPITAL_COMMUNITY)
Admission: EM | Admit: 2019-10-28 | Discharge: 2019-10-28 | Disposition: A | Discharge status: Home / Self Care | Payer: Self-pay | Attending: Emergency Medicine | Admitting: Emergency Medicine

## 2019-10-28 ENCOUNTER — Encounter (HOSPITAL_COMMUNITY): Payer: Self-pay | Admitting: *Deleted

## 2019-10-28 DIAGNOSIS — M25562 Pain in left knee: Secondary | ICD-10-CM | POA: Insufficient documentation

## 2019-10-28 DIAGNOSIS — M25561 Pain in right knee: Secondary | ICD-10-CM | POA: Insufficient documentation

## 2019-10-28 DIAGNOSIS — F17210 Nicotine dependence, cigarettes, uncomplicated: Secondary | ICD-10-CM | POA: Insufficient documentation

## 2019-10-28 DIAGNOSIS — R600 Localized edema: Secondary | ICD-10-CM | POA: Insufficient documentation

## 2019-10-28 DIAGNOSIS — R609 Edema, unspecified: Secondary | ICD-10-CM

## 2019-10-28 LAB — BASIC METABOLIC PANEL
Anion gap: 13 (ref 5–15)
BUN: 7 mg/dL (ref 6–20)
CO2: 26 mmol/L (ref 22–32)
Calcium: 9.2 mg/dL (ref 8.9–10.3)
Chloride: 100 mmol/L (ref 98–111)
Creatinine, Ser: 0.42 mg/dL — ABNORMAL LOW (ref 0.44–1.00)
GFR calc Af Amer: >60 mL/min (ref >60)
GFR calc non Af Amer: >60 mL/min (ref >60)
Glucose, Bld: 100 mg/dL — ABNORMAL HIGH (ref 70–99)
Potassium: 3.8 mmol/L (ref 3.5–5.1)
Sodium: 139 mmol/L (ref 135–145)

## 2019-10-28 LAB — CBC WITH DIFFERENTIAL/PLATELET
Abs Immature Granulocytes: 0.04 10*3/uL (ref 0.00–0.07)
Basophils Absolute: 0 10*3/uL (ref 0.0–0.1)
Basophils Relative: 0 %
Eosinophils Absolute: 0.2 10*3/uL (ref 0.0–0.5)
Eosinophils Relative: 2 %
HCT: 42.4 % (ref 36.0–46.0)
Hemoglobin: 13.4 g/dL (ref 12.0–15.0)
Immature Granulocytes: 0 %
Lymphocytes Relative: 20 %
Lymphs Abs: 2.3 10*3/uL (ref 0.7–4.0)
MCH: 28 pg (ref 26.0–34.0)
MCHC: 31.6 g/dL (ref 30.0–36.0)
MCV: 88.7 fL (ref 80.0–100.0)
Monocytes Absolute: 0.9 10*3/uL (ref 0.1–1.0)
Monocytes Relative: 8 %
Neutro Abs: 8 10*3/uL — ABNORMAL HIGH (ref 1.7–7.7)
Neutrophils Relative %: 70 %
Platelets: 274 10*3/uL (ref 150–400)
RBC: 4.78 MIL/uL (ref 3.87–5.11)
RDW: 14.2 % (ref 11.5–15.5)
WBC: 11.5 10*3/uL — ABNORMAL HIGH (ref 4.0–10.5)
nRBC: 0 % (ref 0.0–0.2)

## 2019-10-28 MED ORDER — HYDROCODONE-ACETAMINOPHEN 5-325 MG PO TABS: 1.0000 | ORAL_TABLET | ORAL | 0 refills | Status: DC | PRN

## 2019-10-28 MED ORDER — HYDROCHLOROTHIAZIDE 12.5 MG PO TABS
25.0000 mg | ORAL_TABLET | Freq: Every day | ORAL | 0 refills | Status: DC | PRN
Start: 2019-10-28 — End: 2020-02-27

## 2019-10-28 MED ORDER — DICLOFENAC SODIUM 1 % EX GEL: 4.0000 g | Freq: Four times a day (QID) | CUTANEOUS | 0 refills | Status: DC

## 2019-10-28 NOTE — Discharge Instructions (Addendum)
Your lab tests and prior studies here recently are reassuring today.  You will need further evaluation of your knee pain with the MRI which Dr.Keeling has arranged.  I have prescribed an anti inflammatory pain cream to rub into your knees, in addition, you may continue using the hydrocodone prescribed by your primary MD.  Your kidney function is good so you have been prescribed a fluid pill which you can take daily as needed to help with fluid retention in your legs.  Elevation of your legs can also help with leg edema. You have been prescribed a few tablets of hydrocodone to additionally help with pain relief.  Do not drive within 4 hours of taking this medication.

## 2019-10-28 NOTE — ED Triage Notes (Signed)
Patient presents to the ED with bilateral leg pain and swelling.

## 2019-10-28 NOTE — ED Provider Notes (Signed)
[facility] Provider Note   CSN: [id number] Arrival date & time: 10/28/19  1311     History Chief Complaint  Patient presents with  . Leg Pain    Kristine Montgomery is a 54 y.o. female with history of chronic bronchitis, chronic peripheral edema along with generalized arthritis presenting with bilateral knee pain with left greater than right which is been present for the past month.  She has been seen here twice for this condition, had an ultrasound of her left leg on May 21 which was negative for DVT, was seen back here on May 31 at which time she had an arthrocentesis of the left knee which was negative for infection and gout crystals.  She was placed on a short course of prednisone which she is states did help her symptoms for short period of time.  She has also seen her orthopedist Dr. Luna Glasgow 10 days ago and has been scheduled for an MRI of the left knee for further evaluation of this chronic pain.  She denies fevers or chills, she has had no new injuries, but escalating pain with difficulty with weightbearing at this time and reports increasing fullness behind her left knee. She has been taking Robaxin and naproxen currently and has no improvement with these medications.  The history is provided by the patient.       Past Medical History:  Diagnosis Date  . Chronic bronchitis ([place])   . Edema extremities   . Ovarian cyst     Patient Active Problem List   Diagnosis Date Noted  . Urge incontinence 04/10/2018  . OAB (overactive bladder) 04/10/2018  . Urinary frequency 04/10/2018  . 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 CESAREAN SECTION    . TONSILLECTOMY    . TUBAL LIGATION       OB History    Gravida  1   Para  1   Term  1   Preterm      AB      Living  2     SAB      TAB      Ectopic      Multiple  1   Live Births  2           Family History  Problem  Relation Age of Onset  . Cancer Paternal Grandfather        colon  . Cancer Maternal Grandmother        lung  . COPD Father   . Non-Hodgkin's lymphoma Mother   . Diabetes Mother   . Cancer Mother        cervical  . Congestive Heart Failure Mother   . Hypertension Sister   . Other Son        fluid around heart    Social History   Tobacco Use  . Smoking status: Current Every Day Smoker    Packs/day: 0.50    Years: 30.00    Pack years: 15.00    Types: Cigarettes  . Smokeless tobacco: Never Used  Vaping Use  . Vaping Use: Never used  Substance Use Topics  . Alcohol use: Yes    Comment: occasional  . Drug use: No    Home Medications Prior to Admission medications   Medication Sig Start Date End Date Taking? Authorizing Provider  gabapentin (NEURONTIN) 100 MG capsule Take 100 mg by mouth 3 (three) times daily as  needed (nerve pain).  06/07/19  Yes [provider]  diclofenac Sodium (VOLTAREN) 1 % GEL Apply 4 g topically 4 (four) times daily. 10/28/19   Vencil Basnett, Raynelle Fanning, PA-C  hydrochlorothiazide (HYDRODIURIL) 12.5 MG tablet Take 2 tablets (25 mg total) by mouth daily as needed (peripheral edema). 10/28/19   Burgess Amor, PA-C  HYDROcodone-acetaminophen (NORCO/VICODIN) 5-325 MG tablet Take 1 tablet by mouth every 4 (four) hours as needed for moderate pain. 10/28/19   Burgess Amor, PA-C    Allergies    Patient has no known allergies.  Review of Systems   Review of Systems  Constitutional: Negative for fever.  HENT: Negative for congestion and sore throat.   Eyes: Negative.   Respiratory: Negative for chest tightness and shortness of breath.   Cardiovascular: Positive for leg swelling. Negative for chest pain.  Gastrointestinal: Negative for abdominal pain and nausea.  Genitourinary: Negative.   Musculoskeletal: Positive for arthralgias. Negative for joint swelling and neck pain.  Skin: Negative.  Negative for rash and wound.  Neurological: Negative for dizziness,  weakness, light-headedness, numbness and headaches.  Psychiatric/Behavioral: Negative.     Physical Exam Updated Vital Signs BP (!) 136/53 (BP Location: Right Wrist)   Pulse 75   Temp 97.9 F (36.6 C)   Resp 20   Ht 5\' 1"  (1.549 m)   Wt 124 kg   SpO2 96%   BMI 51.65 kg/m   Physical Exam Constitutional:      Appearance: She is well-developed.  HENT:     Head: Atraumatic.  Cardiovascular:     Comments: Pulses equal bilaterally Musculoskeletal:        General: Tenderness present.     Cervical back: Normal range of motion.     Right lower leg: Edema present.     Left lower leg: Edema present.     Comments: Bilateral lower extremity edema, pitting to mid tibia, left greater than right.  Dorsalis pedis pulses are present.  She is tender to palpation along her medial right knee joint and her posterior left knee.  There is no increased warmth, there is evidence of a small left knee effusion.  Skin:    General: Skin is warm and dry.  Neurological:     Mental Status: She is alert.     Sensory: No sensory deficit.     Deep Tendon Reflexes: Reflexes normal.     ED Results / Procedures / Treatments   Labs (all labs ordered are listed, but only abnormal results are displayed) Labs Reviewed  BASIC METABOLIC PANEL - Abnormal; Notable for the following components:      Result Value   Glucose, Bld 100 (*)    Creatinine, Ser 0.42 (*)    All other components within normal limits  CBC WITH DIFFERENTIAL/PLATELET - Abnormal; Notable for the following components:   WBC 11.5 (*)    Neutro Abs 8.0 (*)    All other components within normal limits    EKG None  Radiology No results found.  Procedures Procedures (including critical care time)  Medications Ordered in ED Medications - No data to display  ED Course  I have reviewed the triage vital signs and the nursing notes.  Pertinent labs & imaging results that were available during my care of the patient were reviewed by me  and considered in my medical decision making (see chart for details).    MDM Rules/Calculators/A&P  Patient with acute on chronic bilateral knee pain under the care of Dr. Hilda Lias, anticipating MRI of the left knee joint.  Frequent recent visits for similar symptoms, at last visit was ruled out for gout or infectious process.,  Prior to this had an ultrasound of the left lower leg secondary to left greater than right peripheral edema, this was negative for DVT.  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  Labs obtained today were reassuring, normal kidney function.  She was given a prescription for HCTZ for as needed use, also discussed elevation of her extremities as much as she can, consideration for compression stockings while at work.  She was also prescribed Voltaren gel for her knee pain, consider reducing oral NSAIDs which can also worsen peripheral edema.  Plan follow-up with Dr. Hilda Lias for further management of her knee pain. Final Clinical Impression(s) / ED Diagnoses Final diagnoses:  Acute pain of both knees  Peripheral edema    Rx / DC Orders ED Discharge Orders         Ordered    diclofenac Sodium (VOLTAREN) 1 % GEL  4 times daily     Discontinue  Reprint     10/28/19 1627    hydrochlorothiazide (HYDRODIURIL) 12.5 MG tablet  Daily PRN     Discontinue  Reprint     10/28/19 1627    HYDROcodone-acetaminophen (NORCO/VICODIN) 5-325 MG tablet  Every 4 hours PRN,   Status:  Discontinued     Reprint     10/28/19 1646    HYDROcodone-acetaminophen (NORCO/VICODIN) 5-325 MG tablet  Every 4 hours PRN     Discontinue  Reprint     10/28/19 1647           Burgess Amor, PA-C 10/28/19 1652    Eber Hong, MD 11/09/19 1451

## 2019-11-08 ENCOUNTER — Ambulatory Visit (HOSPITAL_COMMUNITY): Payer: Self-pay

## 2019-11-13 ENCOUNTER — Ambulatory Visit: Payer: Self-pay | Admitting: Orthopaedic Surgery

## 2019-11-14 ENCOUNTER — Other Ambulatory Visit: Payer: Self-pay

## 2019-11-14 ENCOUNTER — Ambulatory Visit (HOSPITAL_COMMUNITY)
Admission: RE | Admit: 2019-11-14 | Discharge: 2019-11-14 | Disposition: A | Discharge status: Home / Self Care | Payer: Self-pay | Source: Ambulatory Visit | Attending: Orthopaedic Surgery | Admitting: Orthopaedic Surgery

## 2019-11-14 DIAGNOSIS — M25562 Pain in left knee: Secondary | ICD-10-CM | POA: Insufficient documentation

## 2019-11-14 DIAGNOSIS — G8929 Other chronic pain: Secondary | ICD-10-CM | POA: Insufficient documentation

## 2019-11-15 ENCOUNTER — Ambulatory Visit (INDEPENDENT_AMBULATORY_CARE_PROVIDER_SITE_OTHER): Payer: Self-pay | Admitting: Orthopaedic Surgery

## 2019-11-15 ENCOUNTER — Encounter: Payer: Self-pay | Admitting: Orthopaedic Surgery

## 2019-11-15 VITALS — BP 158/89 | HR 85 | Ht 61.0 in | Wt 272.0 lb

## 2019-11-15 DIAGNOSIS — M25562 Pain in left knee: Secondary | ICD-10-CM

## 2019-11-15 DIAGNOSIS — F17210 Nicotine dependence, cigarettes, uncomplicated: Secondary | ICD-10-CM

## 2019-11-15 DIAGNOSIS — G8929 Other chronic pain: Secondary | ICD-10-CM

## 2019-11-15 DIAGNOSIS — Z6841 Body Mass Index (BMI) 40.0 and over, adult: Secondary | ICD-10-CM

## 2019-11-15 MED ORDER — HYDROCODONE-ACETAMINOPHEN 5-325 MG PO TABS: ORAL_TABLET | ORAL | 0 refills | Status: DC

## 2019-11-15 NOTE — Progress Notes (Signed)
[name], female DOB:[birth year], 54 y.o. AVW:[id number]  Chief Complaint  Patient presents with  . Knee Pain    Lt knee MRI, Rt knee pain today    HPI  Kristine Montgomery is a 53 y.o. female who has had knee pain on the left for some time which got worse about 5 to 6 weeks ago.  She went to the ER on multiple occasions with knee pain, had Doppler, arthrocentesis, and on her last visit of 10-28-2019 had order for MRI.  MRI was done yesterday.  I just received the report and it showed: IMPRESSION: 1. Complex tear involving the posterior horn and mid body region of the medial meniscus. 2. Intact ligamentous structures and no acute bony findings. 3. Tricompartmental degenerative changes most significant in the medial compartment. 4. Large joint effusion and superior and medial patellar plica  I have explained the findings to her.  I have independently reviewed the MRI.  I will have her see Dr. Romeo Montgomery for possible surgery.  I have explained the procedure to her.    Body mass index is 51.39 kg/m.  ROS  Review of Systems  All other systems reviewed and are negative.  The following is a summary of the past history medically, past history surgically, known current medicines, social history and family history.  This information is gathered electronically by the computer from prior information and documentation.  I review this each visit and have found including this information at this point in the chart is beneficial and informative.    Past Medical History:  Diagnosis Date  . Chronic bronchitis ([facility])   . Edema extremities   . Ovarian cyst     Past Surgical History:  Procedure Laterality Date  . CESAREAN SECTION    . TONSILLECTOMY    . TUBAL LIGATION      Family History  Problem Relation Age of Onset  . Cancer Paternal Grandfather        colon  . Cancer Maternal Grandmother        lung  . COPD Father   . Non-Hodgkin's lymphoma Mother   . Diabetes Mother    . Cancer Mother        cervical  . Congestive Heart Failure Mother   . Hypertension Sister   . Other Son        fluid around heart    Social History Social History   Tobacco Use  . Smoking status: Current Every Day Smoker    Packs/day: 0.50    Years: 30.00    Pack years: 15.00    Types: Cigarettes  . Smokeless tobacco: Never Used  Vaping Use  . Vaping Use: Never used  Substance Use Topics  . Alcohol use: Yes    Comment: occasional  . Drug use: No    No Known Allergies  Current Outpatient Medications  Medication Sig Dispense Refill  . diclofenac Sodium (VOLTAREN) 1 % GEL Apply 4 g topically 4 (four) times daily. 150 g 0  . gabapentin (NEURONTIN) 100 MG capsule Take 100 mg by mouth 3 (three) times daily as needed (nerve pain).     . hydrochlorothiazide (HYDRODIURIL) 12.5 MG tablet Take 2 tablets (25 mg total) by mouth daily as needed (peripheral edema). 30 tablet 0  . HYDROcodone-acetaminophen (NORCO/VICODIN) 5-325 MG tablet One tablet every four hours as needed for acute pain.  Limit of five days per [place] statue. 30 tablet 0   No current facility-administered medications for this visit.  Physical Exam  Blood pressure (!) 158/89, pulse 85, height 5\' 1"  (1.549 m), weight 272 lb (123.4 kg).  Constitutional: overall normal hygiene, normal nutrition, well developed, normal grooming, normal body habitus. Assistive device:none  Musculoskeletal: gait and station Limp left, muscle tone and strength are normal, no tremors or atrophy is present.  .  Neurological: coordination overall normal.  Deep tendon reflex/nerve stretch intact.  Sensation normal.  Cranial nerves II-XII intact.   Skin:   Normal overall no scars, lesions, ulcers or rashes. No psoriasis.  Psychiatric: Alert and oriented x 3.  Recent memory intact, remote memory unclear.  Normal mood and affect. Well groomed.  Good eye contact.  Cardiovascular: overall no swelling, no varicosities, no edema  bilaterally, normal temperatures of the legs and arms, no clubbing, cyanosis and good capillary refill.  Lymphatic: palpation is normal.  Left knee with large effusion, crepitus, limp left, ROM 0 to 95, positive medial [place], NV intact, no distal edema.  All other systems reviewed and are negative   The patient has been educated about the nature of the problem(s) and counseled on treatment options.  The patient appeared to understand what I have discussed and is in agreement with it.  Encounter Diagnoses  Name Primary?  . Chronic pain of left knee Yes  . Body mass index 50.0-59.9, adult (HCC)   . Morbid obesity ([facility])   . Nicotine dependence, cigarettes, uncomplicated     PLAN Call if any problems.  Precautions discussed.  Continue current medications.   Return to clinic to see Dr. 09-12-1994   .[email or web address] Electronically Signed Kristine Apple, MD 7/1/202111:44 AM

## 2019-11-16 ENCOUNTER — Telehealth: Payer: Self-pay | Admitting: Orthopedic Surgery

## 2019-11-16 NOTE — Telephone Encounter (Signed)
July 6

## 2019-11-16 NOTE — Telephone Encounter (Signed)
This patient was seen by Dr. Hilda Lias yesterday.  He would like for you to see this patient for consult/possible surgery.  Can you advise me when you think a good time would be for you to see her?   Thanks

## 2019-11-20 ENCOUNTER — Ambulatory Visit (INDEPENDENT_AMBULATORY_CARE_PROVIDER_SITE_OTHER): Payer: Self-pay | Admitting: Orthopedic Surgery

## 2019-11-20 ENCOUNTER — Encounter: Payer: Self-pay | Admitting: Orthopedic Surgery

## 2019-11-20 ENCOUNTER — Other Ambulatory Visit: Payer: Self-pay

## 2019-11-20 VITALS — Ht 61.0 in | Wt 272.0 lb

## 2019-11-20 DIAGNOSIS — Z6841 Body Mass Index (BMI) 40.0 and over, adult: Secondary | ICD-10-CM

## 2019-11-20 DIAGNOSIS — G8929 Other chronic pain: Secondary | ICD-10-CM

## 2019-11-20 DIAGNOSIS — M25562 Pain in left knee: Secondary | ICD-10-CM

## 2019-11-20 NOTE — Patient Instructions (Addendum)
Apply for [facility]   Then we can set up arthroscopy left knee medial meisectomy   Meniscus Injury, Arthroscopy   Arthroscopy is a surgical procedure that involves the use of a small scope that has a camera and surgical instruments on the end (arthroscope). An arthroscope can be used to repair your meniscus injury.  LET [facility] CARE PROVIDER KNOW ABOUT:  Any allergies you have.  All medicines you are taking, including vitamins, herbs, eyedrops, creams, and over-the-counter medicines.  Any recent colds or infections you have had or currently have.  Previous problems you or members of your family have had with the use of anesthetics.  Any blood disorders or blood clotting problems you have.  Previous surgeries you have had.  Medical conditions you have. RISKS AND COMPLICATIONS Generally, this is a safe procedure. However, as with any procedure, problems can occur. Possible problems include:  Damage to nerves or blood vessels.  Excess bleeding.  Blood clots.  Infection. BEFORE THE PROCEDURE  Do not eat or drink for 6-8 hours before the procedure.  Take medicines as directed by your surgeon. Ask your surgeon about changing or stopping your regular medicines.  You may have lab tests the morning of surgery. PROCEDURE  You will be given one of the following:   A medicine that numbs the area (local anesthesia).  A medicine that makes you go to sleep (general anesthesia).  A medicine injected into your spine that numbs your body below the waist (spinal anesthesia). Most often, several small cuts (incisions) are made in the knee. The arthroscope and instruments go into the incisions to repair the damage. The torn portion of the meniscus is removed.   AFTER THE PROCEDURE  You will be taken to the recovery area where your progress will be monitored. When you are awake, stable, and taking fluids without complications, you will be allowed to go home. This is usually the  same day. A torn or stretched ligament (ligament sprain) may take 6-8 weeks to heal.   It takes about the 4-6 WEEKS if your surgeon removed a torn meniscus.  A repaired meniscus may require 6-12 weeks of recovery time.  A torn ligament needing reconstructive surgery may take 6-12 months to heal fully.   This information is not intended to replace advice given to you by your health care provider. Make sure you discuss any questions you have with your health care provider. You have decided to proceed with operative arthroscopy of the knee. You have decided not to continue with nonoperative measures such as but not limited to oral medication, weight loss, activity modification, physical therapy, bracing, or injection.  We will perform operative arthroscopy of the knee. Some of the risks associated with arthroscopic surgery of the knee include but are not limited to Bleeding Infection Swelling Stiffness Blood clot Pain Need for knee replacement surgery    In compliance with recent [place] law in federal regulation regarding opioid use and abuse and addiction, we will taper (stop) opioid medication after 2 weeks.  If you're not comfortable with these risks and would like to continue with nonoperative treatment please let Dr. Romeo Apple know prior to your surgery.   Calorie Counting for Weight Loss Calories are units of energy. Your body needs a certain amount of calories from food to keep you going throughout the day. When you eat more calories than your body needs, your body stores the extra calories as fat. When you eat fewer calories than your body  needs, your body burns fat to get the energy it needs. Calorie counting means keeping track of how many calories you eat and drink each day. Calorie counting can be helpful if you need to lose weight. If you make sure to eat fewer calories than your body needs, you should lose weight. Ask your health care provider what a healthy weight is for  you. For calorie counting to work, you will need to eat the right number of calories in a day in order to lose a healthy amount of weight per week. A dietitian can help you determine how many calories you need in a day and will give you suggestions on how to reach your calorie goal.  A healthy amount of weight to lose per week is usually 1-2 lb (0.5-0.9 kg). This usually means that your daily calorie intake should be reduced by 500-750 calories.  Eating 1,200 - 1,500 calories per day can help most women lose weight.  Eating 1,500 - 1,800 calories per day can help most men lose weight. What is my plan? My goal is to have __________ calories per day. If I have this many calories per day, I should lose around __________ pounds per week. What do I need to know about calorie counting? In order to meet your daily calorie goal, you will need to:  Find out how many calories are in each food you would like to eat. Try to do this before you eat.  Decide how much of the food you plan to eat.  Write down what you ate and how many calories it had. Doing this is called keeping a food log. To successfully lose weight, it is important to balance calorie counting with a healthy lifestyle that includes regular activity. Aim for 150 minutes of moderate exercise (such as walking) or 75 minutes of vigorous exercise (such as running) each week. Where do I find calorie information?  The number of calories in a food can be found on a Nutrition Facts label. If a food does not have a Nutrition Facts label, try to look up the calories online or ask your dietitian for help. Remember that calories are listed per serving. If you choose to have more than one serving of a food, you will have to multiply the calories per serving by the amount of servings you plan to eat. For example, the label on a package of bread might say that a serving size is 1 slice and that there are 90 calories in a serving. If you eat 1 slice, you  will have eaten 90 calories. If you eat 2 slices, you will have eaten 180 calories. How do I keep a food log? Immediately after each meal, record the following information in your food log:  What you ate. Don't forget to include toppings, sauces, and other extras on the food.  How much you ate. This can be measured in cups, ounces, or number of items.  How many calories each food and drink had.  The total number of calories in the meal. Keep your food log near you, such as in a small notebook in your pocket, or use a mobile app or website. Some programs will calculate calories for you and show you how many calories you have left for the day to meet your goal. What are some calorie counting tips?   Use your calories on foods and drinks that will fill you up and not leave you hungry: ? Some examples of foods that fill  you up are nuts and nut butters, vegetables, lean proteins, and high-fiber foods like whole grains. High-fiber foods are foods with more than 5 g fiber per serving. ? Drinks such as sodas, specialty coffee drinks, alcohol, and juices have a lot of calories, yet do not fill you up.  Eat nutritious foods and avoid empty calories. Empty calories are calories you get from foods or beverages that do not have many vitamins or protein, such as candy, sweets, and soda. It is better to have a nutritious high-calorie food (such as an avocado) than a food with few nutrients (such as a bag of chips).  Know how many calories are in the foods you eat most often. This will help you calculate calorie counts faster.  Pay attention to calories in drinks. Low-calorie drinks include water and unsweetened drinks.  Pay attention to nutrition labels for "low fat" or "fat free" foods. These foods sometimes have the same amount of calories or more calories than the full fat versions. They also often have added sugar, starch, or salt, to make up for flavor that was removed with the fat.  Find a way of  tracking calories that works for you. Get creative. Try different apps or programs if writing down calories does not work for you. What are some portion control tips?  Know how many calories are in a serving. This will help you know how many servings of a certain food you can have.  Use a measuring cup to measure serving sizes. You could also try weighing out portions on a kitchen scale. With time, you will be able to estimate serving sizes for some foods.  Take some time to put servings of different foods on your favorite plates, bowls, and cups so you know what a serving looks like.  Try not to eat straight from a bag or box. Doing this can lead to overeating. Put the amount you would like to eat in a cup or on a plate to make sure you are eating the right portion.  Use smaller plates, glasses, and bowls to prevent overeating.  Try not to multitask (for example, watch TV or use your computer) while eating. If it is time to eat, sit down at a table and enjoy your food. This will help you to know when you are full. It will also help you to be aware of what you are eating and how much you are eating. What are tips for following this plan? Reading food labels  Check the calorie count compared to the serving size. The serving size may be smaller than what you are used to eating.  Check the source of the calories. Make sure the food you are eating is high in vitamins and protein and low in saturated and trans fats. Shopping  Read nutrition labels while you shop. This will help you make healthy decisions before you decide to purchase your food.  Make a grocery list and stick to it. Cooking  Try to cook your favorite foods in a healthier way. For example, try baking instead of frying.  Use low-fat dairy products. Meal planning  Use more fruits and vegetables. Half of your plate should be fruits and vegetables.  Include lean proteins like poultry and fish. How do I count calories when  eating out?  Ask for smaller portion sizes.  Consider sharing an entree and sides instead of getting your own entree.  If you get your own entree, eat only half. Ask for a box  at the beginning of your meal and put the rest of your entree in it so you are not tempted to eat it.  If calories are listed on the menu, choose the lower calorie options.  Choose dishes that include vegetables, fruits, whole grains, low-fat dairy products, and lean protein.  Choose items that are boiled, broiled, grilled, or steamed. Stay away from items that are buttered, battered, fried, or served with cream sauce. Items labeled "crispy" are usually fried, unless stated otherwise.  Choose water, low-fat milk, unsweetened iced tea, or other drinks without added sugar. If you want an alcoholic beverage, choose a lower calorie option such as a glass of wine or light beer.  Ask for dressings, sauces, and syrups on the side. These are usually high in calories, so you should limit the amount you eat.  If you want a salad, choose a garden salad and ask for grilled meats. Avoid extra toppings like bacon, cheese, or fried items. Ask for the dressing on the side, or ask for olive oil and vinegar or lemon to use as dressing.  Estimate how many servings of a food you are given. For example, a serving of cooked rice is  cup or about the size of half a baseball. Knowing serving sizes will help you be aware of how much food you are eating at restaurants. The list below tells you how big or small some common portion sizes are based on everyday objects: ? 1 oz--4 stacked dice. ? 3 oz--1 deck of cards. ? 1 tsp--1 die. ? 1 Tbsp-- a ping-pong ball. ? 2 Tbsp--1 ping-pong ball. ?  cup-- baseball. ? 1 cup--1 baseball. Summary  Calorie counting means keeping track of how many calories you eat and drink each day. If you eat fewer calories than your body needs, you should lose weight.  A healthy amount of weight to lose per week  is usually 1-2 lb (0.5-0.9 kg). This usually means reducing your daily calorie intake by 500-750 calories.  The number of calories in a food can be found on a Nutrition Facts label. If a food does not have a Nutrition Facts label, try to look up the calories online or ask your dietitian for help.  Use your calories on foods and drinks that will fill you up, and not on foods and drinks that will leave you hungry.  Use smaller plates, glasses, and bowls to prevent overeating. This information is not intended to replace advice given to you by your health care provider. Make sure you discuss any questions you have with your health care provider. Document Revised: 01/20/2018 Document Reviewed: 04/02/2016 Elsevier Patient Education  2020 [employer].  Steps to Quit Smoking Smoking tobacco is the leading cause of preventable death. It can affect almost every organ in the body. Smoking puts you and people around you at risk for many serious, long-lasting (chronic) diseases. Quitting smoking can be hard, but it is one of the best things that you can do for your health. It is never too late to quit. How do I get ready to quit? When you decide to quit smoking, make a plan to help you succeed. Before you quit:  Pick a date to quit. Set a date within the next 2 weeks to give you time to prepare.  Write down the reasons why you are quitting. Keep this list in places where you will see it often.  Tell your family, friends, and co-workers that you are quitting. Their support is important.  Talk with your doctor about the choices that may help you quit.  Find out if your health insurance will pay for these treatments.  Know the people, places, things, and activities that make you want to smoke (triggers). Avoid them. What first steps can I take to quit smoking?  Throw away all cigarettes at home, at work, and in your car.  Throw away the things that you use when you smoke, such as ashtrays and  lighters.  Clean your car. Make sure to empty the ashtray.  Clean your home, including curtains and carpets. What can I do to help me quit smoking? Talk with your doctor about taking medicines and seeing a counselor at the same time. You are more likely to succeed when you do both.  If you are pregnant or breastfeeding, talk with your doctor about counseling or other ways to quit smoking. Do not take medicine to help you quit smoking unless your doctor tells you to do so. To quit smoking: Quit right away  Quit smoking totally, instead of slowly cutting back on how much you smoke over a period of time.  Go to counseling. You are more likely to quit if you go to counseling sessions regularly. Take medicine You may take medicines to help you quit. Some medicines need a prescription, and some you can buy over-the-counter. Some medicines may contain a drug called nicotine to replace the nicotine in cigarettes. Medicines may:  Help you to stop having the desire to smoke (cravings).  Help to stop the problems that come when you stop smoking (withdrawal symptoms). Your doctor may ask you to use:  Nicotine patches, gum, or lozenges.  Nicotine inhalers or sprays.  Non-nicotine medicine that is taken by mouth. Find resources Find resources and other ways to help you quit smoking and remain smoke-free after you quit. These resources are most helpful when you use them often. They include:  Online chats with a Veterinary surgeoncounselor.  Phone quitlines.  Printed Materials engineerself-help materials.  Support groups or group counseling.  Text messaging programs.  Mobile phone apps. Use apps on your mobile phone or tablet that can help you stick to your quit plan. There are many free apps for mobile phones and tablets as well as websites. Examples include Quit Guide from the [employer] and smokefree.gov  What things can I do to make it easier to quit?   Talk to your family and friends. Ask them to support and encourage  you.  Call a phone quitline (1-800-QUIT-NOW), reach out to support groups, or work with a Veterinary surgeoncounselor.  Ask people who smoke to not smoke around you.  Avoid places that make you want to smoke, such as: ? Bars. ? Parties. ? Smoke-break areas at work.  Spend time with people who do not smoke.  Lower the stress in your life. Stress can make you want to smoke. Try these things to help your stress: ? Getting regular exercise. ? Doing deep-breathing exercises. ? Doing yoga. ? Meditating. ? Doing a body scan. To do this, close your eyes, focus on one area of your body at a time from head to toe. Notice which parts of your body are tense. Try to relax the muscles in those areas. How will I feel when I quit smoking? Day 1 to 3 weeks Within the first 24 hours, you may start to have some problems that come from quitting tobacco. These problems are very bad 2-3 days after you quit, but they do not often last for  more than 2-3 weeks. You may get these symptoms:  Mood swings.  Feeling restless, nervous, angry, or annoyed.  Trouble concentrating.  Dizziness.  Strong desire for high-sugar foods and nicotine.  Weight gain.  Trouble pooping (constipation).  Feeling like you may vomit (nausea).  Coughing or a sore throat.  Changes in how the medicines that you take for other issues work in your body.  Depression.  Trouble sleeping (insomnia). Week 3 and afterward After the first 2-3 weeks of quitting, you may start to notice more positive results, such as:  Better sense of smell and taste.  Less coughing and sore throat.  Slower heart rate.  Lower blood pressure.  Clearer skin.  Better breathing.  Fewer sick days. Quitting smoking can be hard. Do not give up if you fail the first time. Some people need to try a few times before they succeed. Do your best to stick to your quit plan, and talk with your doctor if you have any questions or concerns. Summary  Smoking tobacco is  the leading cause of preventable death. Quitting smoking can be hard, but it is one of the best things that you can do for your health.  When you decide to quit smoking, make a plan to help you succeed.  Quit smoking right away, not slowly over a period of time.  When you start quitting, seek help from your doctor, family, or friends. This information is not intended to replace advice given to you by your health care provider. Make sure you discuss any questions you have with your health care provider. Document Revised: 01/26/2019 Document Reviewed: 07/22/2018 Elsevier Patient Education  2020 [employer].

## 2019-11-20 NOTE — Progress Notes (Signed)
PREOP CONSULT  Chief Complaint  Patient presents with  . Knee Pain    left/ surgical consult Dr Hilda Lias     HPI 54 year old female with chronic left knee pain seen by Dr. Hilda Lias and referred to me for possible surgical consult.  Patient complains of pain and swelling of her left knee primarily medial compartment pain but pain is diffuse.  Pain is lateral as well as posteriorly.  She also has some mechanical symptoms ROS  No chest pain or shortness of breath Past Medical History:  Diagnosis Date  . Chronic bronchitis (HCC)   . Edema extremities   . Ovarian cyst     Past Surgical History:  Procedure Laterality Date  . CESAREAN SECTION    . TONSILLECTOMY    . TUBAL LIGATION      Family History  Problem Relation Age of Onset  . Cancer Paternal Grandfather        colon  . Cancer Maternal Grandmother        lung  . COPD Father   . Non-Hodgkin's lymphoma Mother   . Diabetes Mother   . Cancer Mother        cervical  . Congestive Heart Failure Mother   . Hypertension Sister   . Other Son        fluid around heart   Social History   Tobacco Use  . Smoking status: Current Every Day Smoker    Packs/day: 0.50    Years: 30.00    Pack years: 15.00    Types: Cigarettes  . Smokeless tobacco: Never Used  Vaping Use  . Vaping Use: Never used  Substance Use Topics  . Alcohol use: Yes    Comment: occasional  . Drug use: No    No Known Allergies   No outpatient medications have been marked as taking for the 11/20/19 encounter (Office Visit) with Vickki Hearing, MD.    Ht 5\' 1"  (1.549 m)   Wt 272 lb (123.4 kg)   BMI 51.39 kg/m   Physical Exam She is awake alert and oriented x3 Mood affect normal Gait shows a marked limp Appearance is normal other than obesity Ortho Exam Left knee medial joint line tenderness no effusion 125 degrees of flexion ligaments stable strength normal skin intact pulses good sensation normal   MEDICAL DECISION SECTION  xrays  ordered?  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  . Body mass index 50.0-59.9, adult (HCC)   . Morbid obesity (HCC)   . Chronic pain of left knee Yes   No orders of the defined types were placed in this encounter.    PLAN: Recommend arthroscopy left knee partial medial meniscectomy patient understands that she will have continued symptoms because of the arthritis she will need to lose weight  The procedure has been fully reviewed with the patient; The risks and benefits of surgery have been discussed and explained and understood. Alternative treatment has also been reviewed, questions were encouraged and answered. The postoperative plan is also been reviewed.     09-12-1995, MD 11/20/2019 2:54 PM

## 2019-12-05 ENCOUNTER — Telehealth: Payer: Self-pay | Admitting: Orthopedic Surgery

## 2019-12-05 NOTE — Telephone Encounter (Signed)
Patient called to relay she is in process of [facility] discount application, which may take at least another month. She is asking if any medication can be prescribed for her knee pain. If so, pharmacy is [facility].

## 2019-12-14 ENCOUNTER — Emergency Department (HOSPITAL_COMMUNITY)
Admission: EM | Admit: 2019-12-14 | Discharge: 2019-12-14 | Disposition: A | Discharge status: Home / Self Care | Payer: Self-pay | Attending: Emergency Medicine | Admitting: Emergency Medicine

## 2019-12-14 ENCOUNTER — Other Ambulatory Visit: Payer: Self-pay

## 2019-12-14 ENCOUNTER — Emergency Department (HOSPITAL_COMMUNITY): Payer: Self-pay

## 2019-12-14 ENCOUNTER — Encounter (HOSPITAL_COMMUNITY): Payer: Self-pay | Admitting: *Deleted

## 2019-12-14 DIAGNOSIS — M79606 Pain in leg, unspecified: Secondary | ICD-10-CM

## 2019-12-14 DIAGNOSIS — M7121 Synovial cyst of popliteal space [Baker], right knee: Secondary | ICD-10-CM | POA: Insufficient documentation

## 2019-12-14 DIAGNOSIS — F17210 Nicotine dependence, cigarettes, uncomplicated: Secondary | ICD-10-CM | POA: Insufficient documentation

## 2019-12-14 DIAGNOSIS — M25561 Pain in right knee: Secondary | ICD-10-CM

## 2019-12-14 DIAGNOSIS — M79661 Pain in right lower leg: Secondary | ICD-10-CM | POA: Insufficient documentation

## 2019-12-14 MED ORDER — HYDROCODONE-ACETAMINOPHEN 5-325 MG PO TABS: 1.0000 | ORAL_TABLET | Freq: Four times a day (QID) | ORAL | 0 refills | Status: DC | PRN

## 2019-12-14 MED ORDER — FENTANYL CITRATE (PF) 100 MCG/2ML IJ SOLN
50.0000 ug | Freq: Once | INTRAMUSCULAR | Status: AC
  Administered 2019-12-14: 50 ug via INTRAMUSCULAR
  Filled 2019-12-14: qty 2

## 2019-12-14 NOTE — Discharge Instructions (Signed)
You were seen in the emergency department today with knee pain. You have a cyst behind your knee on the right. Call Dr. Romeo Apple to schedule the next available follow up appointment.

## 2019-12-14 NOTE — ED Provider Notes (Signed)
Emergency Department Provider Note   I have reviewed the triage vital signs and the nursing notes.   HISTORY  Chief Complaint Leg Pain   HPI Kristine Montgomery is a 54 y.o. female with PMH reviewed below presents to the ED with acute on chronic bilateral knee pain now worse on the right. She follows with Dr. Romeo Apple regarding the left knee and they are discussing possible surgery in the near future. Over the last 3 weeks she has developed pain in the right knee. She notes pain mainly behind the knee which is severe and worse with movement or touching the area. She denies h/o DVT. No CP or SOB. No fever or chills. No knee redness or warmth. She ambulates with a walker currently due to her left knee pain. No falls or known injury. Has been taking OTC pain medications without relief.    Past Medical History:  Diagnosis Date  . Chronic bronchitis (HCC)   . Edema extremities   . Ovarian cyst     Patient Active Problem List   Diagnosis Date Noted  . Urge incontinence 04/10/2018  . OAB (overactive bladder) 04/10/2018  . Urinary frequency 04/10/2018  . 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 CESAREAN SECTION    . TONSILLECTOMY    . TUBAL LIGATION      Allergies Patient has no known allergies.  Family History  Problem Relation Age of Onset  . Cancer Paternal Grandfather        colon  . Cancer Maternal Grandmother        lung  . COPD Father   . Non-Hodgkin's lymphoma Mother   . Diabetes Mother   . Cancer Mother        cervical  . Congestive Heart Failure Mother   . Hypertension Sister   . Other Son        fluid around heart    Social History Social History   Tobacco Use  . Smoking status: Current Every Day Smoker    Packs/day: 0.50    Years: 30.00    Pack years: 15.00    Types: Cigarettes  . Smokeless tobacco: Never Used  Vaping Use  . Vaping Use: Never used  Substance Use Topics  .  Alcohol use: Yes    Comment: occasional  . Drug use: No    Review of Systems  Constitutional: No fever/chills Cardiovascular: Denies chest pain. Respiratory: Denies shortness of breath. Gastrointestinal: No abdominal pain.  Musculoskeletal: Negative for back pain. Positive bilateral knee pain now worse on the right.  Skin: Negative for rash. Neurological: Negative for focal weakness or numbness.  10-point ROS otherwise negative.  ____________________________________________   PHYSICAL EXAM:  VITAL SIGNS: ED Triage Vitals  Enc Vitals Group     BP 12/14/19 1322 (!) 133/77     Pulse Rate 12/14/19 1322 82     Resp 12/14/19 1322 18     Temp 12/14/19 1322 98.3 F (36.8 C)     Temp src --      SpO2 12/14/19 1322 95 %   Constitutional: Alert and oriented. Well appearing and in no acute distress. Eyes: Conjunctivae are normal.  Head: Atraumatic. Nose: No congestion/rhinnorhea. Mouth/Throat: Mucous membranes are moist.  Neck: No stridor.   Cardiovascular: Normal rate, regular rhythm. Good peripheral circulation. Grossly normal heart sounds.   Respiratory: Normal respiratory effort.   Gastrointestinal: No distention.  Musculoskeletal: ROM of the right  knee limited 2/2 pain. No joint redness or warmth. No deformity. Normal ROM of the right hip and ankle.  Neurologic:  Normal speech and language. Normal sensation in the bilateral LEs. Normal strength.  Skin:  Skin is warm, dry and intact. No rash noted.  ____________________________________________  RADIOLOGY  US Venous Img Lower Bilateral (DVT)  Result Date: 12/14/2019 CLINICAL DATA:  54 year old female with a history of right lower extremity pain EXAM: BILATERAL LOWER EXTREMITY VENOUS DOPPLER ULTRASOUND TECHNIQUE: Gray-scale sonography with graded compression, as well as color Doppler and duplex ultrasound were performed to evaluate the lower extremity deep venous systems from the level of the common femoral vein and  including the common femoral, femoral, profunda femoral, popliteal and calf veins including the posterior tibial, peroneal and gastrocnemius veins when visible. The superficial great saphenous vein was also interrogated. Spectral Doppler was utilized to evaluate flow at rest and with distal augmentation maneuvers in the common femoral, femoral and popliteal veins. COMPARISON:  None. FINDINGS: RIGHT LOWER EXTREMITY Common Femoral Vein: No evidence of thrombus. Normal compressibility, respiratory phasicity and response to augmentation. Saphenofemoral Junction: No evidence of thrombus. Normal compressibility and flow on color Doppler imaging. Profunda Femoral Vein: No evidence of thrombus. Normal compressibility and flow on color Doppler imaging. Femoral Vein: No evidence of thrombus. Normal compressibility, respiratory phasicity and response to augmentation. Popliteal Vein: No evidence of thrombus. Normal compressibility, respiratory phasicity and response to augmentation. Calf Veins: No evidence of thrombus. Normal compressibility and flow on color Doppler imaging. Superficial Great Saphenous Vein: No evidence of thrombus. Normal compressibility and flow on color Doppler imaging. Other Findings: Anechoic fluid collection in the popliteal region measuring 4.0 cm x 3.1 cm x 1.7 cm LEFT LOWER EXTREMITY Common Femoral Vein: No evidence of thrombus. Normal compressibility, respiratory phasicity and response to augmentation. Saphenofemoral Junction: No evidence of thrombus. Normal compressibility and flow on color Doppler imaging. Profunda Femoral Vein: No evidence of thrombus. Normal compressibility and flow on color Doppler imaging. Femoral Vein: No evidence of thrombus. Normal compressibility, respiratory phasicity and response to augmentation. Popliteal Vein: No evidence of thrombus. Normal compressibility, respiratory phasicity and response to augmentation. Calf Veins: No evidence of thrombus. Normal compressibility  and flow on color Doppler imaging. Superficial Great Saphenous Vein: No evidence of thrombus. Normal compressibility and flow on color Doppler imaging. Other Findings:  None. IMPRESSION: Sonographic survey of the bilateral lower extremities negative for DVT. Right-sided Baker's cyst Electronically Signed   By: Gilmer Mor D.O.   On: 12/14/2019 14:41   DG Knee Complete 4 Views Right  Result Date: 12/14/2019 CLINICAL DATA:  Pain EXAM: RIGHT KNEE - COMPLETE 4+ VIEW COMPARISON:  08/18/2019 FINDINGS: Alignment is anatomic. There is a joint effusion. Changes of osteoarthritis with joint space narrowing primarily medially. No acute fracture. 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  Joint effusion. Electronically Signed   By: Guadlupe Spanish M.D.   On: 12/14/2019 14:33    ____________________________________________   PROCEDURES  Procedure(s) performed:   Procedures  None  ____________________________________________   INITIAL IMPRESSION / ASSESSMENT AND PLAN / ED COURSE  Pertinent labs & imaging results that were available during my care of the patient were reviewed by me and considered in my medical decision making (see chart for details).   Patient with right knee pain. No evidence on exam or by history to suspect septic joint. [place] with no DVT but Baker's cyst on the right. Plan for ACE wrap and pain medication with Dr. Romeo Apple f/u. She  will call today to an appt.   Plain film with OA. Discussed pain meds after [place] drug database review. Dr. Sherwood Gambler will need to Rx pain meds longer term. Will give a small Rx for the weekend.    ____________________________________________  FINAL CLINICAL IMPRESSION(S) / ED DIAGNOSES  Final diagnoses:  Acute pain of right knee  Synovial cyst of right popliteal space     MEDICATIONS GIVEN DURING THIS VISIT:  Medications  fentaNYL (SUBLIMAZE) injection 50 mcg (50 mcg Intramuscular Given 12/14/19 1347)     NEW OUTPATIENT  MEDICATIONS STARTED DURING THIS VISIT:  New Prescriptions   HYDROCODONE-ACETAMINOPHEN (NORCO/VICODIN) 5-325 MG TABLET    Take 1 tablet by mouth every 6 (six) hours as needed for severe pain.    Note:  This document was prepared using Dragon voice recognition software and may include unintentional dictation errors.  Alona Bene, MD, [facility]    Tykeem Lanzer, Arlyss Repress, MD 12/14/19 [id number]

## 2019-12-14 NOTE — ED Triage Notes (Signed)
Right leg pain for 3 weeks, states she has difficulty standing

## 2019-12-17 ENCOUNTER — Telehealth: Payer: Self-pay | Admitting: Orthopaedic Surgery

## 2019-12-17 NOTE — Telephone Encounter (Signed)
Patient relays she was seen in emergency room for right knee problem - "opposite knee" that Dr Hilda Lias referred to Dr Romeo Apple to see, which has been done. Asking if okay to see Dr Hilda Lias for this knee, or have Dr Romeo Apple see her for the new knee problem as well. Please advise.

## 2019-12-17 NOTE — Telephone Encounter (Signed)
Done

## 2019-12-17 NOTE — Telephone Encounter (Signed)
Whatever appointment she can get first.

## 2019-12-20 NOTE — Telephone Encounter (Signed)
Scheduled patient aware

## 2020-01-01 ENCOUNTER — Other Ambulatory Visit (HOSPITAL_COMMUNITY): Payer: Self-pay | Admitting: Internal Medicine

## 2020-01-01 ENCOUNTER — Other Ambulatory Visit: Payer: Self-pay

## 2020-01-01 ENCOUNTER — Encounter: Payer: Self-pay | Admitting: Orthopaedic Surgery

## 2020-01-01 ENCOUNTER — Ambulatory Visit (INDEPENDENT_AMBULATORY_CARE_PROVIDER_SITE_OTHER): Payer: 59 | Admitting: Orthopaedic Surgery

## 2020-01-01 VITALS — BP 168/70 | HR 87 | Ht 61.0 in | Wt 268.0 lb

## 2020-01-01 DIAGNOSIS — G8929 Other chronic pain: Secondary | ICD-10-CM

## 2020-01-01 DIAGNOSIS — Z6841 Body Mass Index (BMI) 40.0 and over, adult: Secondary | ICD-10-CM

## 2020-01-01 DIAGNOSIS — Z1231 Encounter for screening mammogram for malignant neoplasm of breast: Secondary | ICD-10-CM

## 2020-01-01 DIAGNOSIS — M25561 Pain in right knee: Secondary | ICD-10-CM

## 2020-01-01 DIAGNOSIS — F17210 Nicotine dependence, cigarettes, uncomplicated: Secondary | ICD-10-CM

## 2020-01-01 NOTE — Patient Instructions (Signed)
Steps to Quit Smoking Smoking tobacco is the leading cause of preventable death. It can affect almost every organ in the body. Smoking puts you and people around you at risk for many serious, long-lasting (chronic) diseases. Quitting smoking can be hard, but it is one of the best things that you can do for your health. It is never too late to quit. How do I get ready to quit? When you decide to quit smoking, make a plan to help you succeed. Before you quit:  Pick a date to quit. Set a date within the next 2 weeks to give you time to prepare.  Write down the reasons why you are quitting. Keep this list in places where you will see it often.  Tell your family, friends, and co-workers that you are quitting. Their support is important.  Talk with your doctor about the choices that may help you quit.  Find out if your health insurance will pay for these treatments.  Know the people, places, things, and activities that make you want to smoke (triggers). Avoid them. What first steps can I take to quit smoking?  Throw away all cigarettes at home, at work, and in your car.  Throw away the things that you use when you smoke, such as ashtrays and lighters.  Clean your car. Make sure to empty the ashtray.  Clean your home, including curtains and carpets. What can I do to help me quit smoking? Talk with your doctor about taking medicines and seeing a counselor at the same time. You are more likely to succeed when you do both.  If you are pregnant or breastfeeding, talk with your doctor about counseling or other ways to quit smoking. Do not take medicine to help you quit smoking unless your doctor tells you to do so. To quit smoking: Quit right away  Quit smoking totally, instead of slowly cutting back on how much you smoke over a period of time.  Go to counseling. You are more likely to quit if you go to counseling sessions regularly. Take medicine You may take medicines to help you quit. Some  medicines need a prescription, and some you can buy over-the-counter. Some medicines may contain a drug called nicotine to replace the nicotine in cigarettes. Medicines may:  Help you to stop having the desire to smoke (cravings).  Help to stop the problems that come when you stop smoking (withdrawal symptoms). Your doctor may ask you to use:  Nicotine patches, gum, or lozenges.  Nicotine inhalers or sprays.  Non-nicotine medicine that is taken by mouth. Find resources Find resources and other ways to help you quit smoking and remain smoke-free after you quit. These resources are most helpful when you use them often. They include:  Online chats with a counselor.  Phone quitlines.  Printed self-help materials.  Support groups or group counseling.  Text messaging programs.  Mobile phone apps. Use apps on your mobile phone or tablet that can help you stick to your quit plan. There are many free apps for mobile phones and tablets as well as websites. Examples include Quit Guide from the CDC and smokefree.gov  What things can I do to make it easier to quit?   Talk to your family and friends. Ask them to support and encourage you.  Call a phone quitline (1-800-QUIT-NOW), reach out to support groups, or work with a counselor.  Ask people who smoke to not smoke around you.  Avoid places that make you want to smoke,   such as: ? Bars. ? Parties. ? Smoke-break areas at work.  Spend time with people who do not smoke.  Lower the stress in your life. Stress can make you want to smoke. Try these things to help your stress: ? Getting regular exercise. ? Doing deep-breathing exercises. ? Doing yoga. ? Meditating. ? Doing a body scan. To do this, close your eyes, focus on one area of your body at a time from head to toe. Notice which parts of your body are tense. Try to relax the muscles in those areas. How will I feel when I quit smoking? Day 1 to 3 weeks Within the first 24 hours,  you may start to have some problems that come from quitting tobacco. These problems are very bad 2-3 days after you quit, but they do not often last for more than 2-3 weeks. You may get these symptoms:  Mood swings.  Feeling restless, nervous, angry, or annoyed.  Trouble concentrating.  Dizziness.  Strong desire for high-sugar foods and nicotine.  Weight gain.  Trouble pooping (constipation).  Feeling like you may vomit (nausea).  Coughing or a sore throat.  Changes in how the medicines that you take for other issues work in your body.  Depression.  Trouble sleeping (insomnia). Week 3 and afterward After the first 2-3 weeks of quitting, you may start to notice more positive results, such as:  Better sense of smell and taste.  Less coughing and sore throat.  Slower heart rate.  Lower blood pressure.  Clearer skin.  Better breathing.  Fewer sick days. Quitting smoking can be hard. Do not give up if you fail the first time. Some people need to try a few times before they succeed. Do your best to stick to your quit plan, and talk with your doctor if you have any questions or concerns. Summary  Smoking tobacco is the leading cause of preventable death. Quitting smoking can be hard, but it is one of the best things that you can do for your health.  When you decide to quit smoking, make a plan to help you succeed.  Quit smoking right away, not slowly over a period of time.  When you start quitting, seek help from your doctor, family, or friends. This information is not intended to replace advice given to you by your health care provider. Make sure you discuss any questions you have with your health care provider. Document Revised: 01/26/2019 Document Reviewed: 07/22/2018 Elsevier Patient Education  2020 Elsevier Inc.  

## 2020-01-01 NOTE — Progress Notes (Signed)
[name], female DOB:Nov 20, 1965, 54 y.o. AVW:[id number]  Chief Complaint  Patient presents with  . Knee Pain    right     HPI  Kristine Montgomery is a 54 y.o. female who has bilateral knee pain.  I have seen her for the left knee and she has tear of the medial meniscus.  She is scheduled to see Dr. Romeo Apple on August 24 to discuss surgery.  She went to the ER on 12-14-2019 complaining of pain in both knees.  She had doppler studies done.  She was told to followup here.  She has more pain of the right knee now.  She saw Dr. Sherwood Gambler yesterday and he gave her pain medicine.  She has swelling of both knees, popping and giving way of both knees.  She is using a walker today.  I have reviewed the ER notes from 12-14-2019. Dr. Sharyon Medicus notes are not available this am.  I have reviewed the Doppler studies.  She continues to smoke.   Body mass index is 50.64 kg/m.  The patient meets the AMA guidelines for Morbid (severe) obesity with a BMI > 40.0 and I have recommended weight loss.   ROS  Review of Systems  All other systems reviewed and are negative.  The following is a summary of the past history medically, past history surgically, known current medicines, social history and family history.  This information is gathered electronically by the computer from prior information and documentation.  I review this each visit and have found including this information at this point in the chart is beneficial and informative.    Past Medical History:  Diagnosis Date  . Chronic bronchitis (HCC)   . Edema extremities   . Ovarian cyst     Past Surgical History:  Procedure Laterality Date  . CESAREAN SECTION    . TONSILLECTOMY    . TUBAL LIGATION      Family History  Problem Relation Age of Onset  . Cancer Paternal Grandfather        colon  . Cancer Maternal Grandmother        lung  . COPD Father   . Non-Hodgkin's lymphoma Mother   . Diabetes Mother   . Cancer Mother         cervical  . Congestive Heart Failure Mother   . Hypertension Sister   . Other Son        fluid around heart    Social History Social History   Tobacco Use  . Smoking status: Current Every Day Smoker    Packs/day: 0.50    Years: 30.00    Pack years: 15.00    Types: Cigarettes  . Smokeless tobacco: Never Used  Vaping Use  . Vaping Use: Never used  Substance Use Topics  . Alcohol use: Yes    Comment: occasional  . Drug use: No    No Known Allergies  Current Outpatient Medications  Medication Sig Dispense Refill  . HYDROcodone-acetaminophen (NORCO/VICODIN) 5-325 MG tablet Take 1 tablet by mouth every 6 (six) hours as needed for severe pain. 10 tablet 0  . ibuprofen (ADVIL) 200 MG tablet Take 800 mg by mouth every 6 (six) hours as needed.    . hydrochlorothiazide (HYDRODIURIL) 12.5 MG tablet Take 2 tablets (25 mg total) by mouth daily as needed (peripheral edema). (Patient not taking: Reported on 12/14/2019) 30 tablet 0   No current facility-administered medications for this visit.     Physical Exam  Blood pressure Marland Kitchen)  168/70, pulse 87, height 5\' 1"  (1.549 m), weight 268 lb (121.6 kg).  Constitutional: overall normal hygiene, normal nutrition, well developed, normal grooming, normal body habitus. Assistive device:walker  Musculoskeletal: gait and station Limp right, muscle tone and strength are normal, no tremors or atrophy is present.  .  Neurological: coordination overall normal.  Deep tendon reflex/nerve stretch intact.  Sensation normal.  Cranial nerves II-XII intact.   Skin:   Normal overall no scars, lesions, ulcers or rashes. No psoriasis.  Psychiatric: Alert and oriented x 3.  Recent memory intact, remote memory unclear.  Normal mood and affect. Well groomed.  Good eye contact.  Cardiovascular: overall no swelling, no varicosities, no edema bilaterally, normal temperatures of the legs and arms, no clubbing, cyanosis and good capillary refill.  Lymphatic:  palpation is normal.  Both knees are tender.  Right and left with effusion, pain, crepitus.  More pain on the right knee.  She has ROM right 0 to 100 and left 0 to 110.  NV intact.  All other systems reviewed and are negative   The patient has been educated about the nature of the problem(s) and counseled on treatment options.  The patient appeared to understand what I have discussed and is in agreement with it.  Encounter Diagnoses  Name Primary?  . Chronic pain of right knee Yes  . Body mass index 50.0-59.9, adult (HCC)   . Morbid obesity (HCC)   . Nicotine dependence, cigarettes, uncomplicated    PROCEDURE NOTE:  The patient requests injections of the right knee , verbal consent was obtained.  The right knee was prepped appropriately after time out was performed.   Sterile technique was observed and injection of 1 cc of Depo-Medrol 40 mg with several cc's of plain xylocaine. Anesthesia was provided by ethyl chloride and a 20-gauge needle was used to inject the knee area. The injection was tolerated well.  A band aid dressing was applied.  The patient was advised to apply ice later today and tomorrow to the injection sight as needed.   PLAN Call if any problems.  Precautions discussed.  Continue current medications.   Return to clinic to see Dr. 09-12-1994 August 24.  She may need MRI of the right knee.   Electronically Signed August 26, MD 8/17/202110:31 AM

## 2020-01-08 ENCOUNTER — Other Ambulatory Visit: Payer: Self-pay

## 2020-01-08 ENCOUNTER — Encounter: Payer: Self-pay | Admitting: Orthopedic Surgery

## 2020-01-08 ENCOUNTER — Ambulatory Visit (INDEPENDENT_AMBULATORY_CARE_PROVIDER_SITE_OTHER): Payer: 59 | Admitting: Orthopedic Surgery

## 2020-01-08 VITALS — BP 155/86 | HR 103 | Ht 61.0 in | Wt 264.0 lb

## 2020-01-08 DIAGNOSIS — M1711 Unilateral primary osteoarthritis, right knee: Secondary | ICD-10-CM

## 2020-01-08 DIAGNOSIS — M23322 Other meniscus derangements, posterior horn of medial meniscus, left knee: Secondary | ICD-10-CM | POA: Diagnosis not present

## 2020-01-08 DIAGNOSIS — M23321 Other meniscus derangements, posterior horn of medial meniscus, right knee: Secondary | ICD-10-CM | POA: Diagnosis not present

## 2020-01-08 DIAGNOSIS — M1710 Unilateral primary osteoarthritis, unspecified knee: Secondary | ICD-10-CM

## 2020-01-08 DIAGNOSIS — M1712 Unilateral primary osteoarthritis, left knee: Secondary | ICD-10-CM

## 2020-01-08 DIAGNOSIS — M25562 Pain in left knee: Secondary | ICD-10-CM

## 2020-01-08 DIAGNOSIS — G8929 Other chronic pain: Secondary | ICD-10-CM

## 2020-01-08 NOTE — Progress Notes (Signed)
Chief Complaint  Patient presents with  . Knee Pain    Lt knee pain     54 year old female did not have insurance was going to have a left knee arthroscopy for torn medial meniscus and osteoarthritis since that time she is developed pain in her right knee went to the emergency room  X-rays there showed degenerative arthritis as well  She notices the right knee hurts worse than the left.  She complains of pain in the front of the knee medial side of the knee and back of the joint with primary symptoms located over the medial joint line  This is associated with a feeling of weakness but without giving way  She has not had any treatment other than from the ER for this new right knee pain  Review of systems no fever chills nausea vomiting numbness tingling diarrhea dysuria  Past Medical History:  Diagnosis Date  . Chronic bronchitis (HCC)   . Edema extremities   . Ovarian cyst    BP (!) 155/86   Pulse (!) 103   Ht 5\' 1"  (1.549 m)   Wt 264 lb (119.7 kg)   BMI 49.88 kg/m  The patient meets the AMA guidelines for Morbid (severe) obesity with a BMI > 40.0 and I have recommended weight loss.  Physical Exam Constitutional:      General: She is not in acute distress.    Appearance: She is well-developed.  Cardiovascular:     Comments: No peripheral edema Musculoskeletal:     Comments: Left knee mild medial joint line tenderness effusion mild full range of motion no instability muscle tone normal  Right knee is more tender than the left knee on the medial joint line she also has some anterior tenderness no posterior palpable tenderness range of motion is limited by pain ligaments are stable muscle tone is normal.  Meniscal sign positive  Skin:    General: Skin is warm and dry.  Neurological:     Mental Status: She is alert and oriented to person, place, and time.     Sensory: No sensory deficit.     Coordination: Coordination normal.     Gait: Gait normal.     Deep Tendon  Reflexes: Reflexes are normal and symmetric.    Outside images were obtained at the ER x-rays 4 views of the right knee I interpret this as a arthritic knee with mild degenerative changes  Patient probably has a right knee with arthritis and torn medial meniscus  Also has left knee with arthritis and torn medial meniscus  We wanted to schedule her for a right knee MRI to evaluate that knee before proceeding with surgery on the left however [facility] requires physical therapy so she will undergo that for 4 weeks and then come back if no improvement MRI right knee will be obtained.  Surgery postponed  Encounter Diagnoses  Name Primary?  . Chronic pain of both knees Yes  . Primary localized osteoarthritis of knee left knee   . Primary osteoarthritis of right knee   . 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 Derangement of posterior horn of medial meniscus of right knee

## 2020-01-08 NOTE — Patient Instructions (Signed)
Ice the knee 4 x a day for 30 min   Take tylenol or ibuprofen for pain   Use the walker

## 2020-01-17 ENCOUNTER — Ambulatory Visit (HOSPITAL_COMMUNITY): Payer: 59 | Attending: Orthopedic Surgery | Admitting: Physical Therapy

## 2020-01-17 ENCOUNTER — Other Ambulatory Visit: Payer: Self-pay

## 2020-01-17 ENCOUNTER — Encounter (HOSPITAL_COMMUNITY): Payer: Self-pay | Admitting: Physical Therapy

## 2020-01-17 DIAGNOSIS — R29898 Other symptoms and signs involving the musculoskeletal system: Secondary | ICD-10-CM | POA: Diagnosis present

## 2020-01-17 DIAGNOSIS — M25562 Pain in left knee: Secondary | ICD-10-CM | POA: Insufficient documentation

## 2020-01-17 DIAGNOSIS — R2689 Other abnormalities of gait and mobility: Secondary | ICD-10-CM | POA: Insufficient documentation

## 2020-01-17 DIAGNOSIS — M25561 Pain in right knee: Secondary | ICD-10-CM | POA: Diagnosis present

## 2020-01-17 DIAGNOSIS — M6281 Muscle weakness (generalized): Secondary | ICD-10-CM | POA: Diagnosis present

## 2020-01-17 NOTE — Patient Instructions (Signed)
Access Code: [email or web address] URL: [email or web address] Date: 01/17/2020 Prepared by: [facility] Malachi Suderman  Exercises Seated Long Arc Quad - 3 x daily - 7 x weekly - 1 sets - 10 reps - 5 second hold

## 2020-01-17 NOTE — Therapy (Signed)
[facility] [address] [place], [place], [address] Phone: [phone]   Fax:  [phone]  Physical Therapy Evaluation  Patient Details  Name: Kristine Montgomery MRN: [id number] Date of Birth: 1966/02/01 Referring Provider (PT): Fuller Canada MD   Encounter Date: 01/17/2020   PT End of Session - 01/17/20 1124    Visit Number 1    Number of Visits 8    Date for PT Re-Evaluation 02/14/20    Authorization Type [employer] (no auth, v.l. 30)    Authorization - Visit Number 1    Authorization - Number of Visits 30    PT Start Time 1034    PT Stop Time 1113    PT Time Calculation (min) 39 min    Activity Tolerance Patient tolerated treatment well    Behavior During Therapy [facility] for tasks assessed/performed           Past Medical History:  Diagnosis Date  . Chronic bronchitis (HCC)   . Edema extremities   . Ovarian cyst     Past Surgical History:  Procedure Laterality Date  . CESAREAN SECTION    . TONSILLECTOMY    . TUBAL LIGATION      There were no vitals filed for this visit.    Subjective Assessment - 01/17/20 1033    Subjective Patient is a 54 y.o. female who presents to physical therapy with c/o chronic bilateral knee pain. Paient states torn cartilage in L knee. Patient states L knee aches but R knee throbs. She wants to get her R knee stronger because she has to have surgery on her L knee. She has been taking steroids which are helping with her pain. She is limited with walking, stairs, transfers, chores, bending, standing. Patient states pain improves with rest and pain meds. Her main goal is to get her legs working. Pain is mostly in the back of the knee on the R. Pain all around on the left.    Limitations Lifting;Standing;Walking;House hold activities    How long can you walk comfortably? unable    Patient Stated Goals get her knees working    Currently in Pain? Yes    Pain Score 8     Pain Location Knee    Pain  Orientation Right;Left              [facility] PT Assessment - 01/17/20 0001      Assessment   Medical Diagnosis Chronic bilateral knee pain    Referring Provider (PT) Fuller Canada MD    Onset Date/Surgical Date 08/17/19    Next MD Visit September 30    Prior Therapy None      Precautions   Precautions None      Restrictions   Weight Bearing Restrictions No      Balance Screen   Has the patient fallen in the past 6 months No    Has the patient had a decrease in activity level because of a fear of falling?  Yes    Is the patient reluctant to leave their home because of a fear of falling?  No      Prior Function   Level of Independence Independent    Vocation Unemployed      Cognition   Overall Cognitive Status Within Functional Limits for tasks assessed      Observation/Other Assessments   Observations Ambulates with slow, labored gait with RW    Focus on Therapeutic Outcomes (FOTO)  59 %  limited      ROM / Strength   AROM / PROM / Strength AROM;Strength      AROM   Overall AROM Comments pain with all AROM    AROM Assessment Site Knee    Right/Left Knee Right;Left    Right Knee Extension 11   lacking   Right Knee Flexion 105    Left Knee Extension 20   lacking (unable to lay with leg straight out, Hip ER)   Left Knee Flexion 95      Strength   Strength Assessment Site Hip;Knee;Ankle    Right/Left Hip Right;Left    Right Hip Flexion 3/5    Left Hip Flexion 3/5    Right/Left Knee Right;Left    Right Knee Flexion 4/5   pain behind knee   Right Knee Extension 4+/5   pulling behind knee   Left Knee Flexion 4-/5   pain   Left Knee Extension 3+/5   pain   Right/Left Ankle Right;Left    Right Ankle Dorsiflexion 5/5    Left Ankle Dorsiflexion 5/5      Palpation   Palpation comment TTP R medial and lateral joint line and popliteal fossa, L medial and lateral joint line      Transfers   Five time sit to stand comments  15.37 seconds    Comments slow, labored,  hands on Legs      Ambulation/Gait   Ambulation/Gait Yes    Ambulation/Gait Assistance 6: Modified independent (Device/Increase time)    Ambulation Distance (Feet) 330 Feet    Assistive device Rolling walker    Gait Pattern Decreased hip/knee flexion - right;Decreased hip/knee flexion - left;Trendelenburg;Antalgic;Narrow base of support;Poor foot clearance - left;Poor foot clearance - right;Trunk flexed    Ambulation Surface Level;Indoor    Gait Comments 2 MWT                      Objective measurements completed on examination: See above findings.       [facility] Adult PT Treatment/Exercise - 01/17/20 0001      Exercises   Exercises Knee/Hip      Knee/Hip Exercises: Seated   Long Arc Quad 10 reps    Long Arc Quad Limitations 5 second holds bilateral                  PT Education - 01/17/20 1040    Education Details Patient educated on exam findings, POC, scope of PT, initial HEP    Person(s) Educated Patient    Methods Explanation;Demonstration;Handout    Comprehension Verbalized understanding;Returned demonstration            PT Short Term Goals - 01/17/20 1243      PT SHORT TERM GOAL #1   Title Patient will be independent with HEP in order to improve functional outcomes.    Time 2    Period Weeks    Status New    Target Date 01/31/20      PT SHORT TERM GOAL #2   Title Patient will report at least 25% improvement in symptoms for improved quality of life.    Time 2    Period Weeks    Status New    Target Date 01/31/20             PT Long Term Goals - 01/17/20 1244      PT LONG TERM GOAL #1   Title Patient will report at least 75% improvement in symptoms for improved  quality of life.    Time 4    Period Weeks    Status New    Target Date 02/14/20      PT LONG TERM GOAL #2   Title Patient will improve FOTO score by at least 10 points in order to indicate improved tolerance to activity.    Time 4    Period Weeks    Status New     Target Date 02/14/20      PT LONG TERM GOAL #3   Title Patient will be able to complete 5x STS in under 11.4 seconds in order to reduce the risk of falls.    Time 4    Period Weeks    Status New    Target Date 02/14/20                  Plan - 01/17/20 1237    Clinical Impression Statement Patient is a 54 y.o. female who presents to physical therapy with c/o chronic bilateral knee pain. She presents with pain limited deficits in bilateral knee and LE strength, ROM, gait, balance, activity tolerance, and functional mobility with ADL. She is having to modify and restrict ADL as indicated by FOTO score as well as subjective information and objective measures which is affecting overall participation. Patient will benefit from skilled physical therapy in order to improve function and reduce impairment.    Personal Factors and Comorbidities Behavior Pattern;Comorbidity 2;Time since onset of injury/illness/exacerbation;Past/Current Experience;Fitness;Profession    Comorbidities sedentary, increased BMI    Examination-Activity Limitations Bend;Carry;Lift;Locomotion Level;Sit;Sleep;Squat;Stairs;Stand;Transfers    Examination-Participation Restrictions Cleaning;Community Activity;Yard Work;Shop;Occupation    Stability/Clinical Decision Making Stable/Uncomplicated    Clinical Decision Making Low    Rehab Potential Fair    PT Frequency 2x / week    PT Duration 4 weeks    PT Treatment/Interventions ADLs/Self Care Home Management;Aquatic Therapy;Cryotherapy;Electrical Stimulation;Moist Heat;Traction;DME Instruction;Gait training;Stair training;Functional mobility training;Therapeutic activities;Therapeutic exercise;Balance training;Neuromuscular re-education;Patient/family education;Manual techniques;Passive range of motion;Dry needling;Splinting;Taping;Compression bandaging;Scar mobilization;Spinal Manipulations;Joint Manipulations;Energy conservation    PT Next Visit Plan begin LE strengthening  with hip and quad strengthening, begin knee mobility exercises.  initiate balance training and possibly gait training with cane if able    PT Home Exercise Plan 9/2 LAQ    Consulted and Agree with Plan of Care Patient           Patient will benefit from skilled therapeutic intervention in order to improve the following deficits and impairments:  Abnormal gait, Decreased activity tolerance, Decreased balance, Decreased mobility, Decreased endurance, Decreased range of motion, Difficulty walking, Increased edema, Impaired perceived functional ability, Impaired flexibility, Improper body mechanics, Pain, Obesity  Visit Diagnosis: Right knee pain, unspecified chronicity  Left knee pain, unspecified chronicity  Other abnormalities of gait and mobility  Muscle weakness (generalized)  Other symptoms and signs involving the musculoskeletal system     Problem List Patient Active Problem List   Diagnosis Date Noted  . Urge incontinence 04/10/2018  . OAB (overactive bladder) 04/10/2018  . Urinary frequency 04/10/2018  . 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

## 2020-01-24 ENCOUNTER — Ambulatory Visit (HOSPITAL_COMMUNITY): Payer: 59 | Admitting: Physical Therapy

## 2020-01-24 ENCOUNTER — Other Ambulatory Visit: Payer: Self-pay

## 2020-01-24 DIAGNOSIS — M25561 Pain in right knee: Secondary | ICD-10-CM | POA: Diagnosis not present

## 2020-01-24 DIAGNOSIS — M6281 Muscle weakness (generalized): Secondary | ICD-10-CM

## 2020-01-24 DIAGNOSIS — M25562 Pain in left knee: Secondary | ICD-10-CM

## 2020-01-24 DIAGNOSIS — R2689 Other abnormalities of gait and mobility: Secondary | ICD-10-CM

## 2020-01-24 DIAGNOSIS — R29898 Other symptoms and signs involving the musculoskeletal system: Secondary | ICD-10-CM

## 2020-01-24 NOTE — Therapy (Signed)
[facility] [address] [place], [place], [address] Phone: [phone]   Fax:  [phone]  Physical Therapy Treatment  Patient Details  Name: Kristine Montgomery MRN: [id number] Date of Birth: [birth year] Referring Provider (PT): Fuller Canada MD   Encounter Date: 01/24/2020   PT End of Session - 01/24/20 1036    Visit Number 2    Number of Visits 8    Date for PT Re-Evaluation 02/14/20    Authorization Type [employer] (no auth, v.l. 30)    Authorization - Visit Number 2    Authorization - Number of Visits 30    PT Start Time 1005    PT Stop Time 1043    PT Time Calculation (min) 38 min    Activity Tolerance Patient tolerated treatment well    Behavior During Therapy [facility] for tasks assessed/performed           Past Medical History:  Diagnosis Date  . Chronic bronchitis (HCC)   . Edema extremities   . Ovarian cyst     Past Surgical History:  Procedure Laterality Date  . CESAREAN SECTION    . TONSILLECTOMY    . TUBAL LIGATION      There were no vitals filed for this visit.   Subjective Assessment - 01/24/20 1014    Subjective pt states her Lt knee is about a 7/10 today.  States her Rt LE just doesn't want to work and throbs sometimes.    Currently in Pain? Yes    Pain Score 7     Pain Location Knee    Pain Orientation Left    Pain Descriptors / Indicators Aching;Tightness                             [facility] Adult PT Treatment/Exercise - 01/24/20 0001      Knee/Hip Exercises: Seated   Long Arc Quad 10 reps    Long [employer] Limitations 5 second holds bilateral      Knee/Hip Exercises: Supine   Quad Sets Both;10 reps    [employer] Limitations 5" holds    Short Arc [employer] Both;10 reps    Short Arc Quad Sets Limitations 5" holds    Heel Slides Both;10 reps    Bridges Both;10 reps    Straight Leg Raises Both;10 reps                  PT Education - 01/24/20 1049    Education Details  review of goals, HEP and POC moving forward    Person(s) Educated Patient    Methods Explanation;Demonstration;Tactile cues;Verbal cues;Handout    Comprehension Verbalized understanding;Returned demonstration;Verbal cues required;Tactile cues required;Need further instruction            PT Short Term Goals - 01/24/20 1040      PT SHORT TERM GOAL #1   Title Patient will be independent with HEP in order to improve functional outcomes.    Time 2    Period Weeks    Status On-going    Target Date 01/31/20      PT SHORT TERM GOAL #2   Title Patient will report at least 25% improvement in symptoms for improved quality of life.    Time 2    Period Weeks    Status On-going    Target Date 01/31/20             PT Long Term  Goals - 01/24/20 1040      PT LONG TERM GOAL #1   Title Patient will report at least 75% improvement in symptoms for improved quality of life.    Time 4    Period Weeks    Status On-going      PT LONG TERM GOAL #2   Title Patient will improve FOTO score by at least 10 points in order to indicate improved tolerance to activity.    Time 4    Period Weeks    Status On-going      PT LONG TERM GOAL #3   Title Patient will be able to complete 5x STS in under 11.4 seconds in order to reduce the risk of falls.    Time 4    Period Weeks    Status On-going                 Plan - 01/24/20 1103    Clinical Impression Statement Reviewed goals, HEP and POC moving forward.  Pt with difficulty completing quad sets due to inablility to fully straighten either knee (Lt more limited than Rt).  Encouraged to use a rolled up towel or pillow underneath at home. Cues for general form and holds.  Added exercises to HEP this session to focus on improving bil LE strength.    Personal Factors and Comorbidities Behavior Pattern;Comorbidity 2;Time since onset of injury/illness/exacerbation;Past/Current Experience;Fitness;Profession    Comorbidities sedentary, increased BMI     Examination-Activity Limitations Bend;Carry;Lift;Locomotion Level;Sit;Sleep;Squat;Stairs;Stand;Transfers    Examination-Participation Restrictions Cleaning;Community Activity;Yard Work;Shop;Occupation    Stability/Clinical Decision Making Stable/Uncomplicated    Rehab Potential Fair    PT Frequency 2x / week    PT Duration 4 weeks    PT Treatment/Interventions ADLs/Self Care Home Management;Aquatic Therapy;Cryotherapy;Electrical Stimulation;Moist Heat;Traction;DME Instruction;Gait training;Stair training;Functional mobility training;Therapeutic activities;Therapeutic exercise;Balance training;Neuromuscular re-education;Patient/family education;Manual techniques;Passive range of motion;Dry needling;Splinting;Taping;Compression bandaging;Scar mobilization;Spinal Manipulations;Joint Manipulations;Energy conservation    PT Next Visit Plan Progress LE strengthening with hip and quad strengthening.  Add sit to stands next session and hip globals.   Progress to balance training and possibly gait training with cane if able    PT Home Exercise Plan 9/2 LAQ   9/9:  QS, bridge, SLR heelslides    Consulted and Agree with Plan of Care Patient           Patient will benefit from skilled therapeutic intervention in order to improve the following deficits and impairments:  Abnormal gait, Decreased activity tolerance, Decreased balance, Decreased mobility, Decreased endurance, Decreased range of motion, Difficulty walking, Increased edema, Impaired perceived functional ability, Impaired flexibility, Improper body mechanics, Pain, Obesity  Visit Diagnosis: Right knee pain, unspecified chronicity  Left knee pain, unspecified chronicity  Other abnormalities of gait and mobility  Muscle weakness (generalized)  Other symptoms and signs involving the musculoskeletal system     Problem List Patient Active Problem List   Diagnosis Date Noted  . Urge incontinence 04/10/2018  . OAB (overactive bladder)  04/10/2018  . Urinary frequency 04/10/2018  . 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

## 2020-01-29 ENCOUNTER — Encounter (HOSPITAL_COMMUNITY): Payer: 59 | Admitting: Physical Therapy

## 2020-01-29 ENCOUNTER — Telehealth (HOSPITAL_COMMUNITY): Payer: Self-pay | Admitting: Physical Therapy

## 2020-01-29 NOTE — Telephone Encounter (Signed)
L/m She will not be here today - no reason given

## 2020-01-30 ENCOUNTER — Telehealth (HOSPITAL_COMMUNITY): Payer: Self-pay | Admitting: Physical Therapy

## 2020-01-30 NOTE — Telephone Encounter (Signed)
She has to cx due to taking care of her father - She will be here next wk

## 2020-01-31 ENCOUNTER — Ambulatory Visit (HOSPITAL_COMMUNITY): Payer: 59 | Admitting: Physical Therapy

## 2020-02-05 ENCOUNTER — Ambulatory Visit (HOSPITAL_COMMUNITY): Payer: 59 | Admitting: Physical Therapy

## 2020-02-05 ENCOUNTER — Other Ambulatory Visit: Payer: Self-pay

## 2020-02-05 ENCOUNTER — Encounter (HOSPITAL_COMMUNITY): Payer: Self-pay | Admitting: Physical Therapy

## 2020-02-05 DIAGNOSIS — M25561 Pain in right knee: Secondary | ICD-10-CM | POA: Diagnosis not present

## 2020-02-05 DIAGNOSIS — M25562 Pain in left knee: Secondary | ICD-10-CM

## 2020-02-05 DIAGNOSIS — R2689 Other abnormalities of gait and mobility: Secondary | ICD-10-CM

## 2020-02-05 DIAGNOSIS — M6281 Muscle weakness (generalized): Secondary | ICD-10-CM

## 2020-02-05 DIAGNOSIS — R29898 Other symptoms and signs involving the musculoskeletal system: Secondary | ICD-10-CM

## 2020-02-05 NOTE — Therapy (Signed)
[facility] [address] [place], [place], [address] Phone: [phone]   Fax:  [phone]  Physical Therapy Treatment  Patient Details  Name: Kristine Montgomery MRN: [id number] Date of Birth: 20-Mar-1966 Referring Provider (PT): Fuller Canada MD   Encounter Date: 02/05/2020   PT End of Session - 02/05/20 1042    Visit Number 3    Number of Visits 8    Date for PT Re-Evaluation 02/14/20    Authorization Type [employer] (no auth, v.l. 30)    Authorization - Visit Number 3    Authorization - Number of Visits 30    PT Start Time 1035    PT Stop Time 1115    PT Time Calculation (min) 40 min    Equipment Utilized During Treatment Gait belt    Activity Tolerance Patient tolerated treatment well;Patient limited by fatigue    Behavior During Therapy [facility] for tasks assessed/performed           Past Medical History:  Diagnosis Date  . Chronic bronchitis (HCC)   . Edema extremities   . Ovarian cyst     Past Surgical History:  Procedure Laterality Date  . CESAREAN SECTION    . TONSILLECTOMY    . TUBAL LIGATION      There were no vitals filed for this visit.   Subjective Assessment - 02/05/20 1040    Subjective Patient says shin area was very sore after last visit. Says she still has trouble getting started with movement especially after sitting for a while. Says knees are hurting today, "about a 7".    Currently in Pain? Yes    Pain Score 7     Pain Location Knee    Pain Orientation Right;Left    Pain Descriptors / Indicators Aching;Tightness                             [facility] Adult PT Treatment/Exercise - 02/05/20 0001      Knee/Hip Exercises: Standing   Heel Raises Both;2 sets;10 reps    Gait Training 226 with SBQC cues for sequence and posturing     Other Standing Knee Exercises tandem stance solid fllor 2 x 20" holds       Knee/Hip Exercises: Seated   Sit to Sand 2 sets;10 reps;without UE support       Knee/Hip Exercises: Supine   Quad Sets Both;10 reps    Quad Sets Limitations 5" hold    Short Arc [employer] Both;10 reps    Short Arc Quad Sets Limitations 3" hold     Heel Slides Both;10 reps    Bridges Both;10 reps    Straight Leg Raises Both;10 reps                    PT Short Term Goals - 01/24/20 1040      PT SHORT TERM GOAL #1   Title Patient will be independent with HEP in order to improve functional outcomes.    Time 2    Period Weeks    Status On-going    Target Date 01/31/20      PT SHORT TERM GOAL #2   Title Patient will report at least 25% improvement in symptoms for improved quality of life.    Time 2    Period Weeks    Status On-going    Target Date 01/31/20  PT Long Term Goals - 01/24/20 1040      PT LONG TERM GOAL #1   Title Patient will report at least 75% improvement in symptoms for improved quality of life.    Time 4    Period Weeks    Status On-going      PT LONG TERM GOAL #2   Title Patient will improve FOTO score by at least 10 points in order to indicate improved tolerance to activity.    Time 4    Period Weeks    Status On-going      PT LONG TERM GOAL #3   Title Patient will be able to complete 5x STS in under 11.4 seconds in order to reduce the risk of falls.    Time 4    Period Weeks    Status On-going                 Plan - 02/05/20 1502    Clinical Impression Statement Patient tolerated session well today. Patient with some initial discomfort and guarding with table ther ex due to stiffness, but improved with cues and practice. Patient shows good carry over for form with prior ther ex, but required verbal cues for foot placement with bridging. Patient able to progress to standing exercise today, as well as static balance. Patient shows very good stability during tandem stance. Initiated gait training using SBQC today, patient cued on using cane in side opposite weaker/ more painful LE which she  describes currently as her RT. Patient did well with sequencing, but with noted decrease in stride length. Patient educated on and issued updated HEP handout.    Personal Factors and Comorbidities Behavior Pattern;Comorbidity 2;Time since onset of injury/illness/exacerbation;Past/Current Experience;Fitness;Profession    Comorbidities sedentary, increased BMI    Examination-Activity Limitations Bend;Carry;Lift;Locomotion Level;Sit;Sleep;Squat;Stairs;Stand;Transfers    Examination-Participation Restrictions Cleaning;Community Activity;Yard Work;Shop;Occupation    Stability/Clinical Decision Making Stable/Uncomplicated    Rehab Potential Fair    PT Frequency 2x / week    PT Duration 4 weeks    PT Treatment/Interventions ADLs/Self Care Home Management;Aquatic Therapy;Cryotherapy;Electrical Stimulation;Moist Heat;Traction;DME Instruction;Gait training;Stair training;Functional mobility training;Therapeutic activities;Therapeutic exercise;Balance training;Neuromuscular re-education;Patient/family education;Manual techniques;Passive range of motion;Dry needling;Splinting;Taping;Compression bandaging;Scar mobilization;Spinal Manipulations;Joint Manipulations;Energy conservation    PT Next Visit Plan Progress LE strengthening with hip and quad strengthening.  Add sit to stands next session and hip globals.   Progress to balance training and possibly gait training with cane if able    PT Home Exercise Plan 9/2 LAQ   9/9:  QS, bridge, SLR heelslides 02/05/20: heel raise, sit to stand    Consulted and Agree with Plan of Care Patient           Patient will benefit from skilled therapeutic intervention in order to improve the following deficits and impairments:  Abnormal gait, Decreased activity tolerance, Decreased balance, Decreased mobility, Decreased endurance, Decreased range of motion, Difficulty walking, Increased edema, Impaired perceived functional ability, Impaired flexibility, Improper body mechanics,  Pain, Obesity  Visit Diagnosis: Right knee pain, unspecified chronicity  Left knee pain, unspecified chronicity  Other abnormalities of gait and mobility  Muscle weakness (generalized)  Other symptoms and signs involving the musculoskeletal system     Problem List Patient Active Problem List   Diagnosis Date Noted  . Urge incontinence 04/10/2018  . OAB (overactive bladder) 04/10/2018  . Urinary frequency 04/10/2018  . 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 [place], [place], [address] Phone: [phone]   Fax:  [phone]  Name: TERRIONA HORLACHER MRN: [id number] Date of Birth: 06/27/1965

## 2020-02-05 NOTE — Patient Instructions (Signed)
Access Code: [email or web address] URL: [email or web address] Date: 02/05/2020 Prepared by: Georges Lynch  Exercises Sit to Stand with Hands on Knees - 2 x daily - 7 x weekly - 1 sets - 10 reps Heel rises with counter support - 2 x daily - 7 x weekly - 2 sets - 10 reps

## 2020-02-07 ENCOUNTER — Ambulatory Visit (HOSPITAL_COMMUNITY): Payer: 59

## 2020-02-07 ENCOUNTER — Encounter (HOSPITAL_COMMUNITY): Payer: Self-pay

## 2020-02-07 ENCOUNTER — Other Ambulatory Visit: Payer: Self-pay

## 2020-02-07 DIAGNOSIS — M6281 Muscle weakness (generalized): Secondary | ICD-10-CM

## 2020-02-07 DIAGNOSIS — M25562 Pain in left knee: Secondary | ICD-10-CM

## 2020-02-07 DIAGNOSIS — R2689 Other abnormalities of gait and mobility: Secondary | ICD-10-CM

## 2020-02-07 DIAGNOSIS — M25561 Pain in right knee: Secondary | ICD-10-CM | POA: Diagnosis not present

## 2020-02-07 DIAGNOSIS — R29898 Other symptoms and signs involving the musculoskeletal system: Secondary | ICD-10-CM

## 2020-02-07 NOTE — Patient Instructions (Addendum)
HIP: Abduction / External Rotation - Side-Lying    Lie on side with hip and knees bent. Pillow between knees. Raise top knee up, squeezing glutes. Keep ankles together. _15__ reps per set, _1__ sets per day.  Copyright  [employer]HI. All rights reserved.

## 2020-02-07 NOTE — Therapy (Signed)
[facility] [address] [place], [place], [address] Phone: [phone]   Fax:  [phone]  Physical Therapy Treatment  Patient Details  Name: Kristine Montgomery MRN: [id number] Date of Birth: [birth year] Referring Provider (PT): Fuller Canada MD   Encounter Date: 02/07/2020   PT End of Session - 02/07/20 1102    Visit Number 4    Number of Visits 8    Date for PT Re-Evaluation 02/14/20    Authorization Type [facility] (no auth, v.l. 30)    Authorization - Visit Number 4    Authorization - Number of Visits 30    PT Start Time 1103    PT Stop Time 1146    PT Time Calculation (min) 43 min    Equipment Utilized During Treatment Gait belt    Activity Tolerance Patient tolerated treatment well;Patient limited by fatigue    Behavior During Therapy [facility] for tasks assessed/performed           Past Medical History:  Diagnosis Date  . Chronic bronchitis (HCC)   . Edema extremities   . Ovarian cyst     Past Surgical History:  Procedure Laterality Date  . CESAREAN SECTION    . TONSILLECTOMY    . TUBAL LIGATION      There were no vitals filed for this visit.   Subjective Assessment - 02/07/20 1102    Subjective Patient reports muscle sore/pain in back of both knees just above and below, "about an 8."             [facility] Adult PT Treatment/Exercise - 02/07/20 0001      Ambulation/Gait   Ambulation/Gait Yes    Ambulation/Gait Assistance 6: Modified independent (Device/Increase time)    Ambulation Distance (Feet) 226 Feet    Assistive device Straight cane    Gait Pattern Decreased hip/knee flexion - right;Decreased hip/knee flexion - left;Trendelenburg;Antalgic;Narrow base of support;Poor foot clearance - left;Poor foot clearance - right;Trunk flexed;Left circumduction;Right circumduction    Ambulation Surface Level;Indoor      Knee/Hip Exercises: Community education officer 3 reps;30 seconds;Left;Right    Passive  Hamstring Stretch Limitations seated    Gastroc Stretch Both;3 reps;30 seconds      Knee/Hip Exercises: Standing   Heel Raises Both;2 sets;10 reps    Other Standing Knee Exercises tandem stance solid fllor 2 x 30" holds       Knee/Hip Exercises: Seated   Long Arc Quad 10 reps    Long Arc Quad Weight 2 lbs.    Long [employer] Limitations 5 second holds bilateral    Sit to [employer] 2 sets;10 reps;without UE support      Knee/Hip Exercises: Sidelying   Clams 15x1 bilateral             PT Education - 02/07/20 1200    Education Details Discussed purpose and technique of interventions throughout session. Gait training with SPC. Advanced HEP.    Person(s) Educated Patient    Methods Explanation;Handout    Comprehension Verbalized understanding;Need further instruction            PT Short Term Goals - 02/07/20 1103      PT SHORT TERM GOAL #1   Title Patient will be independent with HEP in order to improve functional outcomes.    Time 2    Period Weeks    Status Achieved    Target Date 01/31/20      PT SHORT TERM GOAL #2  Title Patient will report at least 25% improvement in symptoms for improved quality of life.    Time 2    Period Weeks    Status On-going    Target Date 01/31/20             PT Long Term Goals - 02/07/20 1103      PT LONG TERM GOAL #1   Title Patient will report at least 75% improvement in symptoms for improved quality of life.    Time 4    Period Weeks    Status On-going      PT LONG TERM GOAL #2   Title Patient will improve FOTO score by at least 10 points in order to indicate improved tolerance to activity.    Time 4    Period Weeks    Status On-going      PT LONG TERM GOAL #3   Title Patient will be able to complete 5x STS in under 11.4 seconds in order to reduce the risk of falls.    Time 4    Period Weeks    Status On-going             Plan - 02/07/20 1103    Clinical Impression Statement Patient tolerated session well today.  Patient with some initial discomfort with muscle soreness pain/discomfort. Added calf stretch on slantboard and seated hamstring stretch (standing passive hamstring stretch too uncomfortable) and sidelying clams. Gait training with SPC with cues for minimizing trunk lateral flexion with opposite hip hike and circumduction. Added sidelying clams to HEP. Continue with current plan, progress as able. Patient unable to lie prone due to hernia. Attempt hip abduction in side-lying and hip extension in standing if patient able to stand on one leg without increased knee pain in stance limb.    Personal Factors and Comorbidities Behavior Pattern;Comorbidity 2;Time since onset of injury/illness/exacerbation;Past/Current Experience;Fitness;Profession    Comorbidities sedentary, increased BMI    Examination-Activity Limitations Bend;Carry;Lift;Locomotion Level;Sit;Sleep;Squat;Stairs;Stand;Transfers    Examination-Participation Restrictions Cleaning;Community Activity;Yard Work;Shop;Occupation    Stability/Clinical Decision Making Stable/Uncomplicated    Rehab Potential Fair    PT Frequency 2x / week    PT Duration 4 weeks    PT Treatment/Interventions ADLs/Self Care Home Management;Aquatic Therapy;Cryotherapy;Electrical Stimulation;Moist Heat;Traction;DME Instruction;Gait training;Stair training;Functional mobility training;Therapeutic activities;Therapeutic exercise;Balance training;Neuromuscular re-education;Patient/family education;Manual techniques;Passive range of motion;Dry needling;Splinting;Taping;Compression bandaging;Scar mobilization;Spinal Manipulations;Joint Manipulations;Energy conservation    PT Next Visit Plan Progress LE strengthening with hip and quad strengthening.  Attempt hip abduction in side-lying and hip extension in standing if patient able to stand on one leg without increased knee pain in stance limb. Gait train with SPC.    PT Home Exercise Plan 9/2 LAQ   9/9:  QS, bridge, SLR heelslides  02/05/20: heel raise, sit to stand; 02/05/20 - tandem stance balance; 02/07/20 - sidelying clams    Consulted and Agree with Plan of Care Patient           Patient will benefit from skilled therapeutic intervention in order to improve the following deficits and impairments:  Abnormal gait, Decreased activity tolerance, Decreased balance, Decreased mobility, Decreased endurance, Decreased range of motion, Difficulty walking, Increased edema, Impaired perceived functional ability, Impaired flexibility, Improper body mechanics, Pain, Obesity  Visit Diagnosis: Right knee pain, unspecified chronicity  Left knee pain, unspecified chronicity  Other abnormalities of gait and mobility  Muscle weakness (generalized)  Other symptoms and signs involving the musculoskeletal system     Problem List Patient Active Problem List   Diagnosis Date Noted  .  Urge incontinence 04/10/2018  . OAB (overactive bladder) 04/10/2018  . Urinary frequency 04/10/2018  . 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 Hartnett-Rands, MS, PT Per Ladoris [facility] #[id number] 02/07/2020, 12:02 PM  [facility] [address] [place], [place], [address] Phone: [phone]   Fax:  [phone]  Name: AYANNI TUN MRN: [id number] Date of Birth: 08/04/1965

## 2020-02-12 ENCOUNTER — Ambulatory Visit (HOSPITAL_COMMUNITY): Payer: 59 | Admitting: Physical Therapy

## 2020-02-12 ENCOUNTER — Telehealth (HOSPITAL_COMMUNITY): Payer: Self-pay | Admitting: Physical Therapy

## 2020-02-12 NOTE — Telephone Encounter (Signed)
pt called to cancel her appt due to her knees hurt.

## 2020-02-14 ENCOUNTER — Encounter: Payer: Self-pay | Admitting: Orthopedic Surgery

## 2020-02-14 ENCOUNTER — Ambulatory Visit (INDEPENDENT_AMBULATORY_CARE_PROVIDER_SITE_OTHER): Payer: 59 | Admitting: Orthopedic Surgery

## 2020-02-14 ENCOUNTER — Ambulatory Visit (HOSPITAL_COMMUNITY): Payer: 59 | Admitting: Physical Therapy

## 2020-02-14 ENCOUNTER — Other Ambulatory Visit: Payer: Self-pay

## 2020-02-14 ENCOUNTER — Encounter (HOSPITAL_COMMUNITY): Payer: Self-pay | Admitting: Physical Therapy

## 2020-02-14 VITALS — BP 164/90 | HR 105 | Ht 61.0 in | Wt 260.0 lb

## 2020-02-14 DIAGNOSIS — M25562 Pain in left knee: Secondary | ICD-10-CM

## 2020-02-14 DIAGNOSIS — M25561 Pain in right knee: Secondary | ICD-10-CM | POA: Diagnosis not present

## 2020-02-14 DIAGNOSIS — R2689 Other abnormalities of gait and mobility: Secondary | ICD-10-CM

## 2020-02-14 DIAGNOSIS — G8929 Other chronic pain: Secondary | ICD-10-CM

## 2020-02-14 DIAGNOSIS — M6281 Muscle weakness (generalized): Secondary | ICD-10-CM

## 2020-02-14 DIAGNOSIS — R29898 Other symptoms and signs involving the musculoskeletal system: Secondary | ICD-10-CM

## 2020-02-14 MED ORDER — ETODOLAC 400 MG PO TABS: 400.0000 mg | ORAL_TABLET | Freq: Two times a day (BID) | ORAL | 3 refills | Status: DC

## 2020-02-14 NOTE — Patient Instructions (Signed)
When therapy is complete call us to schedule office visit

## 2020-02-14 NOTE — Therapy (Signed)
[place], [place], [address] Phone: [phone]   Fax:  [phone]  Physical Therapy Treatment/progress note/Recert  Patient Details  Name: Kristine Montgomery MRN: [id number] Date of Birth: 1966-03-14 Referring Provider (PT): Arther Abbott MD   Encounter Date: 02/14/2020   Progress Note   Reporting Period 01/17/20 to 02/14/20   See note below for Objective Data and Assessment of Progress/Goals    PT End of Session - 02/14/20 1403    Visit Number 5    Number of Visits 16    Date for PT Re-Evaluation 03/13/20    Authorization Type [facility] [employer] (no auth, v.l. 30)    Authorization - Visit Number 5    Authorization - Number of Visits 30    PT Start Time 1404    PT Stop Time 1442    PT Time Calculation (min) 38 min    Equipment Utilized During Treatment Gait belt    Activity Tolerance Patient tolerated treatment well;Patient limited by fatigue    Behavior During Therapy [facility] for tasks assessed/performed           Past Medical History:  Diagnosis Date  . Chronic bronchitis ([place])   . Edema extremities   . Ovarian cyst     Past Surgical History:  Procedure Laterality Date  . CESAREAN SECTION    . TONSILLECTOMY    . TUBAL LIGATION      There were no vitals filed for this visit.   Subjective Assessment - 02/14/20 1404    Subjective Patient states her knees have been swollen and sore and weak starting around the front of her knees and the back. She was doing her exercises until Tuesday but has been able due to pain. Patient states she has been trying to use [facility] at home. She thinks her balance is better. Patient states 60% improvement with PT intervention and remains limited by pain and strength. Her pain has been so bad that her pain meds haven't been helping.    Limitations Lifting;Standing;Walking;House hold activities    Patient Stated Goals get her knees working    Currently in Pain? Yes    Pain Score  10-Worst pain ever    Pain Location Knee    Pain Orientation Right;Left    Pain Descriptors / Indicators Aching              [facility] PT Assessment - 02/14/20 0001      Assessment   Medical Diagnosis Chronic bilateral knee pain    Referring Provider (PT) Arther Abbott MD    Onset Date/Surgical Date 08/17/19    Next MD Visit September 30    Prior Therapy None      Precautions   Precautions None      Restrictions   Weight Bearing Restrictions No      Balance Screen   Has the patient fallen in the past 6 months No    Has the patient had a decrease in activity level because of a fear of falling?  No    Is the patient reluctant to leave their home because of a fear of falling?  No      Prior Function   Level of Independence Independent      Cognition   Overall Cognitive Status Within Functional Limits for tasks assessed      Observation/Other Assessments   Observations Ambulates with slow, labored gait with RW    Focus on Therapeutic Outcomes (FOTO)  65%  limited      Strength   Overall Strength Comments pain with all knee arom    Right Hip Flexion 3/5    Left Hip Flexion 3/5    Right Knee Flexion 4/5    Right Knee Extension 4/5    Left Knee Flexion 4-/5    Left Knee Extension 3+/5    Right Ankle Dorsiflexion 5/5    Left Ankle Dorsiflexion 5/5      Transfers   Five time sit to stand comments  13.4 seconds    Comments slow, labored, hands on Legs                         [facility] Adult PT Treatment/Exercise - 02/14/20 0001      Knee/Hip Exercises: Stretches   Passive Hamstring Stretch 3 reps;30 seconds;Left;Right    Passive Hamstring Stretch Limitations seated      Knee/Hip Exercises: Standing   Heel Raises Both;2 sets;10 reps      Knee/Hip Exercises: Seated   Long Arc Quad 10 reps    Long Arc Quad Weight 2 lbs.    Long [employer] Limitations 5 second holds bilateral    Other Seated Knee/Hip Exercises hip abd/add isometric 10x 10 second holds  each                  PT Education - 02/14/20 1403    Education Details Patient educated on HEP, exercise mechanics    Person(s) Educated Patient    Methods Explanation;Demonstration    Comprehension Verbalized understanding;Returned demonstration            PT Short Term Goals - 02/14/20 1417      PT SHORT TERM GOAL #1   Title Patient will be independent with HEP in order to improve functional outcomes.    Time 2    Period Weeks    Status Achieved    Target Date 01/31/20      PT SHORT TERM GOAL #2   Title Patient will report at least 25% improvement in symptoms for improved quality of life.    Time 2    Period Weeks    Status Achieved    Target Date 01/31/20             PT Long Term Goals - 02/14/20 1418      PT LONG TERM GOAL #1   Title Patient will report at least 75% improvement in symptoms for improved quality of life.    Time 4    Period Weeks    Status On-going      PT LONG TERM GOAL #2   Title Patient will improve FOTO score by at least 10 points in order to indicate improved tolerance to activity.    Time 4    Period Weeks    Status On-going      PT LONG TERM GOAL #3   Title Patient will be able to complete 5x STS in under 11.4 seconds in order to reduce the risk of falls.    Time 4    Period Weeks    Status On-going                 Plan - 02/14/20 1404    Clinical Impression Statement Patient has met 2/2 short term goals with ability to complete HEP and improved symptoms. Patient has met 0/3 long term goals and remains limited by pain, weakness, functional strength, gait, activity tolerance. Patient having increased  pain today compared to prior sessions. She fatigues quickly with ambulating into clinic today and appears to be in more pain than usual. Patient with more limitation on FOTO today compared to evaluation despite stating 60% improvement. Patient has been able to ambulate with cane at home with less fatigue but has had to use  this week secondary to pain. Extending POC for another 4 weeks to continue to progress strength, ROM, gait, endurance and functional mobility. Patient will continue to benefit from skilled physical therapy in order to reduce impairment and improve function.    Personal Factors and Comorbidities Behavior Pattern;Comorbidity 2;Time since onset of injury/illness/exacerbation;Past/Current Experience;Fitness;Profession    Comorbidities sedentary, increased BMI    Examination-Activity Limitations Bend;Carry;Lift;Locomotion Level;Sit;Sleep;Squat;Stairs;Stand;Transfers    Examination-Participation Restrictions Cleaning;Community Activity;Yard Work;Shop;Occupation    Stability/Clinical Decision Making Stable/Uncomplicated    Rehab Potential Fair    PT Frequency 2x / week    PT Duration 4 weeks    PT Treatment/Interventions ADLs/Self Care Home Management;Aquatic Therapy;Cryotherapy;Electrical Stimulation;Moist Heat;Traction;DME Instruction;Gait training;Stair training;Functional mobility training;Therapeutic activities;Therapeutic exercise;Balance training;Neuromuscular re-education;Patient/family education;Manual techniques;Passive range of motion;Dry needling;Splinting;Taping;Compression bandaging;Scar mobilization;Spinal Manipulations;Joint Manipulations;Energy conservation    PT Next Visit Plan Progress LE strengthening with hip and quad strengthening.  Attempt hip abduction in side-lying and hip extension in standing if patient able to stand on one leg without increased knee pain in stance limb. Gait train with SPC.    PT Home Exercise Plan 9/2 LAQ   9/9:  QS, bridge, SLR heelslides 02/05/20: heel raise, sit to stand; 02/05/20 - tandem stance balance; 02/07/20 - sidelying clams    Consulted and Agree with Plan of Care Patient           Patient will benefit from skilled therapeutic intervention in order to improve the following deficits and impairments:  Abnormal gait, Decreased activity tolerance,  Decreased balance, Decreased mobility, Decreased endurance, Decreased range of motion, Difficulty walking, Increased edema, Impaired perceived functional ability, Impaired flexibility, Improper body mechanics, Pain, Obesity  Visit Diagnosis: Right knee pain, unspecified chronicity  Left knee pain, unspecified chronicity  Other abnormalities of gait and mobility  Muscle weakness (generalized)  Other symptoms and signs involving the musculoskeletal system     Problem List Patient Active Problem List   Diagnosis Date Noted  . Urge incontinence 04/10/2018  . OAB (overactive bladder) 04/10/2018  . Urinary frequency 04/10/2018  . 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

## 2020-02-14 NOTE — Progress Notes (Signed)
Progress Note   Patient ID: Kristine Montgomery, female   DOB: [birth year], 54 y.o.   MRN: [id number] C. MANAGEMENT   Encounter Diagnoses  Name Primary?  . Chronic pain of right knee Yes  . Chronic pain of left knee    Anjolie currently undergoing physical therapy to eventually get approval for MRI of her knee both knees are's continue to bothering her  She is on Norco she just finished a steroid Dosepak for severe knee pain  Comes in today complaining of bilateral knee pain posterior aspects of her knees she still in a walker  She is not on any ibuprofen at this time  Recommend following medication and physical therapy and after the therapy is done then come back for follow-up visit  Meds ordered this encounter  Medications  . etodolac (LODINE) 400 MG tablet    Sig: Take 1 tablet (400 mg total) by mouth 2 (two) times daily.    Dispense:  60 tablet    Refill:  3    Body mass index is 49.13 kg/m.  Chief Complaint  Patient presents with  . Knee Pain    bilateral/ going for therapy     Encounter Diagnoses  Name Primary?  . Chronic pain of right knee Yes  . Chronic pain of left knee     HPI We wanted to schedule her for a right knee MRI to evaluate that knee before proceeding with surgery on the left however [facility] requires physical therapy so she will undergo that for 4 weeks and then come back if no improvement MRI right knee will be obtained.

## 2020-02-19 ENCOUNTER — Encounter (HOSPITAL_COMMUNITY): Payer: Self-pay | Admitting: Physical Therapy

## 2020-02-19 ENCOUNTER — Other Ambulatory Visit: Payer: Self-pay

## 2020-02-19 ENCOUNTER — Telehealth (HOSPITAL_COMMUNITY): Payer: Self-pay | Admitting: Specialist

## 2020-02-19 ENCOUNTER — Ambulatory Visit (HOSPITAL_COMMUNITY): Payer: 59 | Attending: Orthopedic Surgery | Admitting: Physical Therapy

## 2020-02-19 DIAGNOSIS — M25561 Pain in right knee: Secondary | ICD-10-CM | POA: Insufficient documentation

## 2020-02-19 DIAGNOSIS — M6281 Muscle weakness (generalized): Secondary | ICD-10-CM

## 2020-02-19 DIAGNOSIS — R29898 Other symptoms and signs involving the musculoskeletal system: Secondary | ICD-10-CM | POA: Insufficient documentation

## 2020-02-19 DIAGNOSIS — R2689 Other abnormalities of gait and mobility: Secondary | ICD-10-CM | POA: Insufficient documentation

## 2020-02-19 DIAGNOSIS — M25562 Pain in left knee: Secondary | ICD-10-CM | POA: Insufficient documentation

## 2020-02-19 NOTE — Therapy (Signed)
[facility] [address] [place], [place], [address] Phone: [phone]   Fax:  [phone]  Physical Therapy Treatment  Patient Details  Name: Kristine Montgomery MRN: [id number] Date of Birth: August 18, 1965 Referring Provider (PT): Fuller Canada MD   Encounter Date: 02/19/2020   PT End of Session - 02/19/20 1049    Visit Number 6    Number of Visits 16    Date for PT Re-Evaluation 03/13/20    Authorization Type [employer] (no auth, v.l. 30)    Authorization - Visit Number 6    Authorization - Number of Visits 30    Progress Note Due on Visit 15    PT Start Time 1040    PT Stop Time 1120    PT Time Calculation (min) 40 min    Equipment Utilized During Treatment Gait belt    Activity Tolerance Patient tolerated treatment well;Patient limited by fatigue    Behavior During Therapy WFL for tasks assessed/performed           Past Medical History:  Diagnosis Date   Chronic bronchitis (HCC)    Edema extremities    Ovarian cyst     Past Surgical History:  Procedure Laterality Date   CESAREAN SECTION     TONSILLECTOMY     TUBAL LIGATION      There were no vitals filed for this visit.   Subjective Assessment - 02/19/20 1048    Subjective Patient says she is doing a little better today. Knees are still bothering her but not as bad as last week. Says she put her walker up for a little while and has been using cane more lately.    Limitations Lifting;Standing;Walking;House hold activities    Patient Stated Goals get her knees working    Currently in Pain? Yes    Pain Score 7     Pain Location Knee    Pain Orientation Right;Left    Pain Descriptors / Indicators Aching    Pain Type Chronic pain    Pain Onset More than a month ago    Pain Frequency Constant                             [facility] Adult PT Treatment/Exercise - 02/19/20 0001      Knee/Hip Exercises: Stretches   Passive Hamstring Stretch 5  reps;10 seconds;Both    Passive Hamstring Stretch Limitations seated    Knee: Self-Stretch to increase Flexion Both;5 reps;10 seconds    Knee: Self-Stretch Limitations from 7 inch step     Gastroc Stretch Both;3 reps;30 seconds    Gastroc Stretch Limitations slant board       Knee/Hip Exercises: Standing   Heel Raises Both;2 sets;10 reps    Hip Abduction Both;2 sets;10 reps;Knee straight    Hip Extension Both;2 sets;10 reps    Forward Step Up Both;1 set;10 reps;Step Height: 4";Hand Hold: 2    Gait Training 226 with SPC cues for heel strike and stride length     Other Standing Knee Exercises tandem stance solid floor 1 x 30", 2 x 30" on foam with intermittent HHA       Knee/Hip Exercises: Seated   Sit to Sand 1 set;10 reps;without UE support                    PT Short Term Goals - 02/14/20 1417      PT SHORT TERM GOAL #  1   Title Patient will be independent with HEP in order to improve functional outcomes.    Time 2    Period Weeks    Status Achieved    Target Date 01/31/20      PT SHORT TERM GOAL #2   Title Patient will report at least 25% improvement in symptoms for improved quality of life.    Time 2    Period Weeks    Status Achieved    Target Date 01/31/20             PT Long Term Goals - 02/14/20 1418      PT LONG TERM GOAL #1   Title Patient will report at least 75% improvement in symptoms for improved quality of life.    Time 4    Period Weeks    Status On-going      PT LONG TERM GOAL #2   Title Patient will improve FOTO score by at least 10 points in order to indicate improved tolerance to activity.    Time 4    Period Weeks    Status On-going      PT LONG TERM GOAL #3   Title Patient will be able to complete 5x STS in under 11.4 seconds in order to reduce the risk of falls.    Time 4    Period Weeks    Status On-going                 Plan - 02/19/20 1153    Clinical Impression Statement Patient continues to be limited by  decreased AROM, knee pain with activity and fatigue. Patient does show overall improvement with tolerance to functional activity, and was able to progress static balance today. Educated patient on possible addition of aquatic therapy to allow enhanced environment for LE mobility and strengthening with less knee pain due to weight bearing. Patient agrees with this plan. Patient cued on eccentric control with lowering portion of sit to stands, and to avoid trunk lean during added hip abduction and extension in standing today.    Personal Factors and Comorbidities Behavior Pattern;Comorbidity 2;Time since onset of injury/illness/exacerbation;Past/Current Experience;Fitness;Profession    Comorbidities sedentary, increased BMI    Examination-Activity Limitations Bend;Carry;Lift;Locomotion Level;Sit;Sleep;Squat;Stairs;Stand;Transfers    Examination-Participation Restrictions Cleaning;Community Activity;Yard Work;Shop;Occupation    Stability/Clinical Decision Making Stable/Uncomplicated    Rehab Potential Fair    PT Frequency 2x / week    PT Duration 4 weeks    PT Treatment/Interventions ADLs/Self Care Home Management;Aquatic Therapy;Cryotherapy;Electrical Stimulation;Moist Heat;Traction;DME Instruction;Gait training;Stair training;Functional mobility training;Therapeutic activities;Therapeutic exercise;Balance training;Neuromuscular re-education;Patient/family education;Manual techniques;Passive range of motion;Dry needling;Splinting;Taping;Compression bandaging;Scar mobilization;Spinal Manipulations;Joint Manipulations;Energy conservation    PT Next Visit Plan Progress LE strengthening with hip and quad strengthening in standing as tolerated. Continue gait training with SPC. Begin aquatics next session    PT Home Exercise Plan 9/2 LAQ   9/9:  QS, bridge, SLR heelslides 02/05/20: heel raise, sit to stand; 02/05/20 - tandem stance balance; 02/07/20 - sidelying clams    Consulted and Agree with Plan of Care Patient            Patient will benefit from skilled therapeutic intervention in order to improve the following deficits and impairments:  Abnormal gait, Decreased activity tolerance, Decreased balance, Decreased mobility, Decreased endurance, Decreased range of motion, Difficulty walking, Increased edema, Impaired perceived functional ability, Impaired flexibility, Improper body mechanics, Pain, Obesity  Visit Diagnosis: Right knee pain, unspecified chronicity  Left knee pain, unspecified chronicity  Other abnormalities of gait  and mobility  Muscle weakness (generalized)  Other symptoms and signs involving the musculoskeletal system     Problem List Patient Active Problem List   Diagnosis Date Noted   Urge incontinence 04/10/2018   OAB (overactive bladder) 04/10/2018   Urinary frequency 04/10/2018   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 [address] [place], [place], [address] Phone: [phone]   Fax:  [phone]  Name: Kristine Montgomery MRN: [id number] Date of Birth: [birth year]

## 2020-02-20 ENCOUNTER — Telehealth (HOSPITAL_COMMUNITY): Payer: Self-pay | Admitting: Physical Therapy

## 2020-02-20 NOTE — Telephone Encounter (Signed)
l/m to let patient know the pool is closed and they will have PT here in the office with Sheria Lang.

## 2020-02-21 ENCOUNTER — Ambulatory Visit (HOSPITAL_COMMUNITY): Payer: 59 | Admitting: Physical Therapy

## 2020-02-21 ENCOUNTER — Telehealth (HOSPITAL_COMMUNITY): Payer: Self-pay | Admitting: Physical Therapy

## 2020-02-21 ENCOUNTER — Encounter (HOSPITAL_COMMUNITY): Payer: 59 | Admitting: Physical Therapy

## 2020-02-21 NOTE — Telephone Encounter (Signed)
S/w patient she doesn't want to come to the office -she request that we cx today since pool is closed

## 2020-02-25 ENCOUNTER — Encounter (HOSPITAL_COMMUNITY): Payer: Self-pay | Admitting: Physical Therapy

## 2020-02-25 ENCOUNTER — Ambulatory Visit (HOSPITAL_COMMUNITY): Payer: 59 | Admitting: Physical Therapy

## 2020-02-25 ENCOUNTER — Other Ambulatory Visit: Payer: Self-pay

## 2020-02-25 DIAGNOSIS — M25561 Pain in right knee: Secondary | ICD-10-CM

## 2020-02-25 DIAGNOSIS — R2689 Other abnormalities of gait and mobility: Secondary | ICD-10-CM

## 2020-02-25 DIAGNOSIS — R29898 Other symptoms and signs involving the musculoskeletal system: Secondary | ICD-10-CM

## 2020-02-25 DIAGNOSIS — M6281 Muscle weakness (generalized): Secondary | ICD-10-CM

## 2020-02-25 DIAGNOSIS — M25562 Pain in left knee: Secondary | ICD-10-CM

## 2020-02-25 NOTE — Therapy (Signed)
[facility] [address] [place], [place], [address] Phone: [phone]   Fax:  [phone]  Physical Therapy Treatment  Patient Details  Name: Kristine Montgomery MRN: [id number] Date of Birth: 09-28-1965 Referring Provider (PT): Fuller Canada MD   Encounter Date: 02/25/2020   PT End of Session - 02/25/20 1056    Visit Number 7    Number of Visits 16    Date for PT Re-Evaluation 03/13/20    Authorization Type [employer] (no auth, v.l. 30)    Authorization - Visit Number 7    Authorization - Number of Visits 30    Progress Note Due on Visit 15    PT Start Time 1040    PT Stop Time 1120    PT Time Calculation (min) 40 min    Equipment Utilized During Treatment Gait belt    Activity Tolerance Patient tolerated treatment well;Patient limited by fatigue    Behavior During Therapy [facility] for tasks assessed/performed           Past Medical History:  Diagnosis Date  . Chronic bronchitis (HCC)   . Edema extremities   . Ovarian cyst     Past Surgical History:  Procedure Laterality Date  . CESAREAN SECTION    . TONSILLECTOMY    . TUBAL LIGATION      There were no vitals filed for this visit.   Subjective Assessment - 02/25/20 1042    Subjective Pt states that the back of her knees feels like there is something pulling making it hard to go.  She is completing her exercises twice a day and has no questiond    Limitations Lifting;Standing;Walking;House hold activities    How long can you walk comfortably? unable    Currently in Pain? Yes    Pain Score 8     Pain Location Knee    Pain Orientation Right;Left    Pain Descriptors / Indicators Aching    Pain Type Chronic pain    Pain Onset More than a month ago    Pain Frequency Constant    Aggravating Factors  walking    Pain Relieving Factors has not tried anything                             [facility] Adult PT Treatment/Exercise - 02/25/20 0001      Exercises    Exercises Knee/Hip      Knee/Hip Exercises: Stretches   Active Hamstring Stretch Limitations 3 x 30 seconds    Passive Hamstring Stretch Both;3 reps;30 seconds    Passive Hamstring Stretch Limitations seated and standing both x 3     Gastroc Stretch Both;3 reps;30 seconds    Gastroc Stretch Limitations slant board       Knee/Hip Exercises: Standing   Heel Raises 15 reps    Forward Step Up Right;Left;15 reps;Step Height: 4"    Functional Squat 10 reps    Rocker Board 2 minutes    SLS x3 B       Knee/Hip Exercises: Seated   Sit to Sand 1 set;15 reps;without UE support                    PT Short Term Goals - 02/14/20 1417      PT SHORT TERM GOAL #1   Title Patient will be independent with HEP in order to improve functional outcomes.    Time 2  Period Weeks    Status Achieved    Target Date 01/31/20      PT SHORT TERM GOAL #2   Title Patient will report at least 25% improvement in symptoms for improved quality of life.    Time 2    Period Weeks    Status Achieved    Target Date 01/31/20             PT Long Term Goals - 02/14/20 1418      PT LONG TERM GOAL #1   Title Patient will report at least 75% improvement in symptoms for improved quality of life.    Time 4    Period Weeks    Status On-going      PT LONG TERM GOAL #2   Title Patient will improve FOTO score by at least 10 points in order to indicate improved tolerance to activity.    Time 4    Period Weeks    Status On-going      PT LONG TERM GOAL #3   Title Patient will be able to complete 5x STS in under 11.4 seconds in order to reduce the risk of falls.    Time 4    Period Weeks    Status On-going                 Plan - 02/25/20 1057    Clinical Impression Statement Pt states that she is unable to get up out of low couches.  Pt continues to vocalize that tight hamstrings tends to be her biggest issue at this time.  Added  mini squat,  and rocker board to address weakness.     Personal Factors and Comorbidities Behavior Pattern;Comorbidity 2;Time since onset of injury/illness/exacerbation;Past/Current Experience;Fitness;Profession    Comorbidities sedentary, increased BMI    Examination-Activity Limitations Bend;Carry;Lift;Locomotion Level;Sit;Sleep;Squat;Stairs;Stand;Transfers    Examination-Participation Restrictions Cleaning;Community Activity;Yard Work;Shop;Occupation    Stability/Clinical Decision Making Stable/Uncomplicated    Rehab Potential Fair    PT Frequency 2x / week    PT Duration 4 weeks    PT Treatment/Interventions ADLs/Self Care Home Management;Aquatic Therapy;Cryotherapy;Electrical Stimulation;Moist Heat;Traction;DME Instruction;Gait training;Stair training;Functional mobility training;Therapeutic activities;Therapeutic exercise;Balance training;Neuromuscular re-education;Patient/family education;Manual techniques;Passive range of motion;Dry needling;Splinting;Taping;Compression bandaging;Scar mobilization;Spinal Manipulations;Joint Manipulations;Energy conservation    PT Next Visit Plan Progress LE strengthening with hip and quad strengthening in standing as tolerated. Continue gait training with SPC. Begin aquatics next session    PT Home Exercise Plan 9/2 LAQ   9/9:  QS, bridge, SLR heelslides 02/05/20: heel raise, sit to stand; 02/05/20 - tandem stance balance; 02/07/20 - sidelying clams    Consulted and Agree with Plan of Care Patient           Patient will benefit from skilled therapeutic intervention in order to improve the following deficits and impairments:  Abnormal gait, Decreased activity tolerance, Decreased balance, Decreased mobility, Decreased endurance, Decreased range of motion, Difficulty walking, Increased edema, Impaired perceived functional ability, Impaired flexibility, Improper body mechanics, Pain, Obesity  Visit Diagnosis: Right knee pain, unspecified chronicity  Left knee pain, unspecified chronicity  Other abnormalities of  gait and mobility  Muscle weakness (generalized)  Other symptoms and signs involving the musculoskeletal system     Problem List Patient Active Problem List   Diagnosis Date Noted  . Urge incontinence 04/10/2018  . OAB (overactive bladder) 04/10/2018  . Urinary frequency 04/10/2018  . 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

## 2020-02-27 ENCOUNTER — Ambulatory Visit (INDEPENDENT_AMBULATORY_CARE_PROVIDER_SITE_OTHER): Payer: 59 | Admitting: Adult Health

## 2020-02-27 ENCOUNTER — Encounter: Payer: Self-pay | Admitting: Adult Health

## 2020-02-27 VITALS — BP 151/97 | HR 91 | Ht 61.0 in | Wt 265.0 lb

## 2020-02-27 DIAGNOSIS — R300 Dysuria: Secondary | ICD-10-CM

## 2020-02-27 DIAGNOSIS — N390 Urinary tract infection, site not specified: Secondary | ICD-10-CM | POA: Diagnosis not present

## 2020-02-27 DIAGNOSIS — R350 Frequency of micturition: Secondary | ICD-10-CM

## 2020-02-27 LAB — POCT URINALYSIS DIPSTICK OB
Blood, UA: NEGATIVE
Glucose, UA: NEGATIVE
Nitrite, UA: POSITIVE

## 2020-02-27 MED ORDER — SULFAMETHOXAZOLE-TRIMETHOPRIM 800-160 MG PO TABS
1.0000 | ORAL_TABLET | Freq: Two times a day (BID) | ORAL | 0 refills | Status: DC
Start: 2020-02-27 — End: 2020-03-20

## 2020-02-27 MED ORDER — PHENAZOPYRIDINE HCL 200 MG PO TABS
200.0000 mg | ORAL_TABLET | Freq: Three times a day (TID) | ORAL | 0 refills | Status: DC | PRN
Start: 2020-02-27 — End: 2020-03-20

## 2020-02-27 NOTE — Progress Notes (Signed)
  Subjective:     Patient ID: Kristine Montgomery, female   DOB: Aug 07, 1965, 54 y.o.   MRN: [id number]  HPI Kristine Montgomery is a 54 year old white female,widowed, PM in complaining of urinary frequency esp at night, may be up 6-7 times and has burning and heaviness at end of stream. PCP is Dr Sherwood Gambler.  Review of Systems +urinary frequency, esp at night  Burns at end of stream  Reviewed past medical,surgical, social and family history. Reviewed medications and allergies.     Objective:   Physical Exam BP (!) 151/97 (BP Location: Left Arm, Patient Position: Sitting, Cuff Size: Normal)   Pulse 91   Ht 5\' 1"  (1.549 m)   Wt 265 lb (120.2 kg)   LMP 06/27/2017 (Within Weeks)   BMI 50.07 kg/m urine +nitates Skin warm and dry. Lungs: clear to ausculation bilaterally. Cardiovascular: regular rate and rhythm.   No CVAT  Upstream - 02/27/20 1144      Pregnancy Intention Screening   Does the patient want to become pregnant in the next year? No    Does the patient's partner want to become pregnant in the next year? No    Would the patient like to discuss contraceptive options today? No      Contraception Wrap Up   Current Method No Method - Other Reason   PM   End Method No Method - Other Reason   PM   Contraception Counseling Provided No          Assessment:     1. Urinary frequency  2. Dysuria Will Rx pyridium  3. Urinary tract infection without hematuria, site unspecified Will Rx septra ds  Meds ordered this encounter  Medications  . sulfamethoxazole-trimethoprim (BACTRIM DS) 800-160 MG tablet    Sig: Take 1 tablet by mouth 2 (two) times daily. Take 1 bid    Dispense:  14 tablet    Refill:  0    Order Specific Question:   Supervising Provider    Answer:   02/29/20, LUTHER H [[id number]]  . phenazopyridine (PYRIDIUM) 200 MG tablet    Sig: Take 1 tablet (200 mg total) by mouth 3 (three) times daily as needed for pain.    Dispense:  10 tablet    Refill:  0    Order Specific Question:   Supervising  Provider    Answer:   Despina Hidden H [[id number]]  -will send urine for UA C&S     Plan:     Return in 3 weeks for pap and physical

## 2020-02-28 ENCOUNTER — Telehealth (HOSPITAL_COMMUNITY): Payer: Self-pay | Admitting: Physical Therapy

## 2020-02-28 ENCOUNTER — Ambulatory Visit (HOSPITAL_COMMUNITY): Payer: 59 | Admitting: Physical Therapy

## 2020-02-28 LAB — URINALYSIS, ROUTINE W REFLEX MICROSCOPIC
Bilirubin, UA: NEGATIVE
Glucose, UA: NEGATIVE
Ketones, UA: NEGATIVE
Nitrite, UA: NEGATIVE
RBC, UA: NEGATIVE
Specific Gravity, UA: 1.012 (ref 1.005–1.030)
Urobilinogen, Ur: 0.2 mg/dL (ref 0.2–1.0)
pH, UA: 6 (ref 5.0–7.5)

## 2020-02-28 LAB — MICROSCOPIC EXAMINATION
Casts: NONE SEEN /lpf
WBC, UA: >30 /hpf — AB (ref 0–5)

## 2020-02-28 NOTE — Telephone Encounter (Signed)
She is having a bad day with her legs hurting all day -she wants to cx today and she will be here on Tuesday of next wk

## 2020-03-01 LAB — URINE CULTURE

## 2020-03-04 ENCOUNTER — Encounter (HOSPITAL_COMMUNITY): Payer: Self-pay | Admitting: Physical Therapy

## 2020-03-04 ENCOUNTER — Other Ambulatory Visit: Payer: Self-pay

## 2020-03-04 ENCOUNTER — Ambulatory Visit (HOSPITAL_COMMUNITY): Payer: 59 | Admitting: Physical Therapy

## 2020-03-04 DIAGNOSIS — M25561 Pain in right knee: Secondary | ICD-10-CM | POA: Diagnosis not present

## 2020-03-04 DIAGNOSIS — R2689 Other abnormalities of gait and mobility: Secondary | ICD-10-CM

## 2020-03-04 DIAGNOSIS — M6281 Muscle weakness (generalized): Secondary | ICD-10-CM

## 2020-03-04 DIAGNOSIS — M25562 Pain in left knee: Secondary | ICD-10-CM

## 2020-03-04 NOTE — Therapy (Signed)
[facility] [address] [place], [place], [address] Phone: [phone]   Fax:  [phone]  Physical Therapy Treatment  Patient Details  Name: Kristine Montgomery MRN: [id number] Date of Birth: 10-25-1965 Referring Provider (PT): Fuller Canada MD   Encounter Date: 03/04/2020   PT End of Session - 03/04/20 1003    Visit Number 8    Number of Visits 16    Date for PT Re-Evaluation 03/13/20    Authorization Type [employer] (no auth, v.l. 30)    Authorization - Visit Number 8    Authorization - Number of Visits 30    Progress Note Due on Visit 15    PT Start Time 1003    PT Stop Time 1041    PT Time Calculation (min) 38 min    Equipment Utilized During Treatment Gait belt    Activity Tolerance Patient tolerated treatment well;Patient limited by fatigue    Behavior During Therapy [facility] for tasks assessed/performed           Past Medical History:  Diagnosis Date  . Chronic bronchitis (HCC)   . Edema extremities   . Ovarian cyst     Past Surgical History:  Procedure Laterality Date  . CESAREAN SECTION    . TONSILLECTOMY    . TUBAL LIGATION      There were no vitals filed for this visit.   Subjective Assessment - 03/04/20 1007    Subjective States that her pain is currently 7/10 in both knees and is throbbing. States that the balance helps with her pain. The pain is in the back of her knees and wraps around to the front. States she has been getting cramps in her legs at night time. States that she had one in each leg on Sunday night. States that they lasted about 15 minutes and they finally resolved.    Limitations Lifting;Standing;Walking;House hold activities    How long can you walk comfortably? unable    Pain Score 7     Pain Onset More than a month ago              [facility] PT Assessment - 03/04/20 0001      Assessment   Medical Diagnosis Chronic bilateral knee pain    Referring Provider (PT) Fuller Canada MD      Onset Date/Surgical Date 08/17/19                         [facility] Adult PT Treatment/Exercise - 03/04/20 0001      Knee/Hip Exercises: Stretches   Active Hamstring Stretch Limitations 3 x 30 seconds   B      Knee/Hip Exercises: Standing   Heel Raises 5 reps;3 sets;5 seconds;Both    Knee Flexion AROM;Both;3 sets;5 reps   5" holds - hamstring curls   Other Standing Knee Exercises tandem stance solid floor 1 x 30" B--> with head turns on floor 4x5 B slow and steady and then with each foot in front    Other Standing Knee Exercises posterior support ankle DF 3x5 5" holds B      Knee/Hip Exercises: Seated   Other Seated Knee/Hip Exercises self massage with rolling stick to both legs - 5 minutes on each leg.     Sit to Sand 2 sets;10 reps;without UE support                  PT Education - 03/04/20 1011  Education Details Educated patient on ingesting more water to help with cramps in muscles - educated patient on different types of hydrating and dehydrating beverages.    Person(s) Educated Patient    Methods Explanation    Comprehension Verbalized understanding            PT Short Term Goals - 02/14/20 1417      PT SHORT TERM GOAL #1   Title Patient will be independent with HEP in order to improve functional outcomes.    Time 2    Period Weeks    Status Achieved    Target Date 01/31/20      PT SHORT TERM GOAL #2   Title Patient will report at least 25% improvement in symptoms for improved quality of life.    Time 2    Period Weeks    Status Achieved    Target Date 01/31/20             PT Long Term Goals - 02/14/20 1418      PT LONG TERM GOAL #1   Title Patient will report at least 75% improvement in symptoms for improved quality of life.    Time 4    Period Weeks    Status On-going      PT LONG TERM GOAL #2   Title Patient will improve FOTO score by at least 10 points in order to indicate improved tolerance to activity.    Time 4     Period Weeks    Status On-going      PT LONG TERM GOAL #3   Title Patient will be able to complete 5x STS in under 11.4 seconds in order to reduce the risk of falls.    Time 4    Period Weeks    Status On-going                 Plan - 03/04/20 1033    Clinical Impression Statement Patient tolerates session moderately well. DF and Heel raises reported to help with knees. Continued pain throughout session but no increase in overall symptoms. Fatigue in legs noted end of session. Performed ankle exercises to improve active motion of ankle with walking secondary to complain of right foot slapping ground hard with walking. Will continue with current POC.    Personal Factors and Comorbidities Behavior Pattern;Comorbidity 2;Time since onset of injury/illness/exacerbation;Past/Current Experience;Fitness;Profession    Comorbidities sedentary, increased BMI    Examination-Activity Limitations Bend;Carry;Lift;Locomotion Level;Sit;Sleep;Squat;Stairs;Stand;Transfers    Examination-Participation Restrictions Cleaning;Community Activity;Yard Work;Shop;Occupation    Stability/Clinical Decision Making Stable/Uncomplicated    Rehab Potential Fair    PT Frequency 2x / week    PT Duration 4 weeks    PT Treatment/Interventions ADLs/Self Care Home Management;Aquatic Therapy;Cryotherapy;Electrical Stimulation;Moist Heat;Traction;DME Instruction;Gait training;Stair training;Functional mobility training;Therapeutic activities;Therapeutic exercise;Balance training;Neuromuscular re-education;Patient/family education;Manual techniques;Passive range of motion;Dry needling;Splinting;Taping;Compression bandaging;Scar mobilization;Spinal Manipulations;Joint Manipulations;Energy conservation    PT Next Visit Plan Progress LE strengthening with hip and quad strengthening in standing as tolerated. Continue gait training with SPC. Begin aquatics next session    PT Home Exercise Plan 9/2 LAQ   9/9:  QS, bridge, SLR  heelslides 02/05/20: heel raise, sit to stand; 02/05/20 - tandem stance balance; 02/07/20 - sidelying clams    Consulted and Agree with Plan of Care Patient           Patient will benefit from skilled therapeutic intervention in order to improve the following deficits and impairments:  Abnormal gait, Decreased activity tolerance, Decreased balance, Decreased mobility, Decreased  endurance, Decreased range of motion, Difficulty walking, Increased edema, Impaired perceived functional ability, Impaired flexibility, Improper body mechanics, Pain, Obesity  Visit Diagnosis: Right knee pain, unspecified chronicity  Other abnormalities of gait and mobility  Muscle weakness (generalized)  Left knee pain, unspecified chronicity     Problem List Patient Active Problem List   Diagnosis Date Noted  . Urinary tract infection without hematuria 02/27/2020  . Dysuria 02/27/2020  . Urge incontinence 04/10/2018  . OAB (overactive bladder) 04/10/2018  . Urinary frequency 04/10/2018  . 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 [place], [place], [address] Phone: [phone]   Fax:  [phone]  Name: SENTA KANTOR MRN: [id number] Date of Birth: July 18, 1965

## 2020-03-11 ENCOUNTER — Telehealth (HOSPITAL_COMMUNITY): Payer: Self-pay | Admitting: Physical Therapy

## 2020-03-11 ENCOUNTER — Ambulatory Visit (HOSPITAL_COMMUNITY): Payer: 59 | Admitting: Physical Therapy

## 2020-03-11 NOTE — Telephone Encounter (Signed)
Cx due to her knees are hurting to much today

## 2020-03-13 ENCOUNTER — Ambulatory Visit (HOSPITAL_COMMUNITY): Payer: 59 | Admitting: Physical Therapy

## 2020-03-13 ENCOUNTER — Other Ambulatory Visit: Payer: Self-pay

## 2020-03-13 DIAGNOSIS — M6281 Muscle weakness (generalized): Secondary | ICD-10-CM

## 2020-03-13 DIAGNOSIS — M25561 Pain in right knee: Secondary | ICD-10-CM | POA: Diagnosis not present

## 2020-03-13 DIAGNOSIS — R2689 Other abnormalities of gait and mobility: Secondary | ICD-10-CM

## 2020-03-13 DIAGNOSIS — R29898 Other symptoms and signs involving the musculoskeletal system: Secondary | ICD-10-CM

## 2020-03-13 DIAGNOSIS — M25562 Pain in left knee: Secondary | ICD-10-CM

## 2020-03-13 NOTE — Therapy (Addendum)
[place], [place], [address] Phone: [phone]   Fax:  [phone]  Physical Therapy Treatment # OF FEET WALKED:  100 feet with SPC ROM:  Flexion: Rt: 110, Lt: 98            Extension: Rt: 10, Lt: 20  Patient Details  Name: Kristine Montgomery MRN: [id number] Date of Birth: 05-25-1965 Referring Provider (PT): Arther Abbott MD   Encounter Date: 03/13/2020   PHYSICAL THERAPY DISCHARGE SUMMARY  Visits from Start of Care: 9  Current functional level related to goals / functional outcomes: Patient has met 2/2 short term goals and 2/3 long term goals with ability to complete HEP, improved symptoms, and functional mobility.   Remaining deficits: Patient remains limited by continued symptoms.    Education / Equipment: HEP  Plan: Patient agrees to discharge.  Patient goals were partially met. Patient is being discharged due to meeting the stated rehab goals.  ?????    2:54 PM, 03/17/20 Mearl Latin PT, DPT Physical Therapist at [facility]    PT End of Session - 03/13/20 1635    Visit Number 9    Number of Visits 16    Date for PT Re-Evaluation 03/13/20    Authorization Type [employer] (no auth, v.l. 30)    Authorization - Visit Number 9    Authorization - Number of Visits 30    Progress Note Due on Visit 15    PT Start Time 1535    PT Stop Time 1615    PT Time Calculation (min) 40 min    Equipment Utilized During Treatment Gait belt    Activity Tolerance Patient tolerated treatment well;Patient limited by fatigue    Behavior During Therapy WFL for tasks assessed/performed           Past Medical History:  Diagnosis Date  . Chronic bronchitis ([place])   . Edema extremities   . Ovarian cyst     Past Surgical History:  Procedure Laterality Date  . CESAREAN SECTION    . TONSILLECTOMY    . TUBAL LIGATION      There were no vitals filed for this visit.   Subjective  Assessment - 03/13/20 1636    Subjective pt states she returns to MD next week with hopes of surgery being scheduled.  STates she is independent with HEP and feels she is ready for discharge.  Pt states she is 60% improved since beginning therapy.  Currently her calves are sore and her Rt knee hurts worse than her Lt knee.    Currently in Pain? Yes    Pain Score 8     Pain Location Knee    Pain Orientation Right;Left    Pain Descriptors / Indicators Aching              [facility] PT Assessment - 03/13/20 1539      Assessment   Medical Diagnosis Chronic bilateral knee pain    Referring Provider (PT) Arther Abbott MD    Onset Date/Surgical Date 08/17/19    Next MD Visit September 30    Prior Therapy None      Precautions   Precautions None      Restrictions   Weight Bearing Restrictions No      Prior Function   Level of Independence Independent      Cognition   Overall Cognitive Status Within Functional Limits for tasks assessed  Observation/Other Assessments   Observations Ambulates with slow, labored gait with SPC (was using a  RW)    Focus on Therapeutic Outcomes (FOTO)  44% limited   was 59% limited     AROM   Right Knee Extension 10   was 11   Right Knee Flexion 110   was 105   Left Knee Extension 20   was 20   Left Knee Flexion 98   was 95     Strength   Overall Strength Comments pain with all knee arom    Right Hip Flexion 4/5   was 3/5   Left Hip Flexion 4/5   was 3/5   Right Knee Flexion 4+/5   was 4/5 tested in seated   Right Knee Extension 4/5   was 4/5 tested in seated   Left Knee Flexion 4/5   was 4-/5   Left Knee Extension 4/5   was 3+/5   Right Ankle Dorsiflexion 5/5   was 5/5   Left Ankle Dorsiflexion 5/5   was 5/5     Transfers   Five time sit to stand comments  8.83 seconds standard chair without UE   was 13. 4 seconds from standard chair without UE's                                PT Education - 03/13/20 1533     Education Details importance of strengthening to improve condition.    Person(s) Educated Patient    Methods Explanation    Comprehension Verbalized understanding            PT Short Term Goals - 03/13/20 1603      PT SHORT TERM GOAL #1   Title Patient will be independent with HEP in order to improve functional outcomes.    Time 2    Period Weeks    Status Achieved    Target Date 01/31/20      PT SHORT TERM GOAL #2   Title Patient will report at least 25% improvement in symptoms for improved quality of life.    Time 2    Period Weeks    Status Achieved    Target Date 01/31/20             PT Long Term Goals - 03/13/20 1604      PT LONG TERM GOAL #1   Title Patient will report at least 75% improvement in symptoms for improved quality of life.    Time 4    Period Weeks    Status Not Met   pt is 60% improved     PT LONG TERM GOAL #2   Title Patient will improve FOTO score by at least 10 points in order to indicate improved tolerance to activity.    Time 4    Period Weeks    Status Achieved      PT LONG TERM GOAL #3   Title Patient will be able to complete 5x STS in under 11.4 seconds in order to reduce the risk of falls.    Time 4    Period Weeks    Status Achieved                 Plan - 03/13/20 1534    Clinical Impression Statement Pt has completed 9 PT treatments over the past 8 weeks for bilateral knee pain.  Pt feels she is 60% improved, however states she  is still having difficulty stepping up onto steps and establishing balance upon standing at times.  pt states her pain is more tolerable and feels her legs are stronger.  MMT  reveals improvement in strength and has slight improvement in ROM.  Pt verbalizes she would like to discontinue PT at this time and continue HEP.    Personal Factors and Comorbidities Behavior Pattern;Comorbidity 2;Time since onset of injury/illness/exacerbation;Past/Current Experience;Fitness;Profession    Comorbidities  sedentary, increased BMI    Examination-Activity Limitations Bend;Carry;Lift;Locomotion Level;Sit;Sleep;Squat;Stairs;Stand;Transfers    Examination-Participation Restrictions Cleaning;Community Activity;Yard Work;Shop;Occupation    Stability/Clinical Decision Making Stable/Uncomplicated    Rehab Potential Fair    PT Frequency 2x / week    PT Duration 4 weeks    PT Treatment/Interventions ADLs/Self Care Home Management;Aquatic Therapy;Cryotherapy;Electrical Stimulation;Moist Heat;Traction;DME Instruction;Gait training;Stair training;Functional mobility training;Therapeutic activities;Therapeutic exercise;Balance training;Neuromuscular re-education;Patient/family education;Manual techniques;Passive range of motion;Dry needling;Splinting;Taping;Compression bandaging;Scar mobilization;Spinal Manipulations;Joint Manipulations;Energy conservation    PT Next Visit Plan Discharge to HEP per patient request.    PT Home Exercise Plan 9/2 LAQ   9/9:  QS, bridge, SLR heelslides 02/05/20: heel raise, sit to stand; 02/05/20 - tandem stance balance; 02/07/20 - sidelying clams    Consulted and Agree with Plan of Care Patient           Patient will benefit from skilled therapeutic intervention in order to improve the following deficits and impairments:  Abnormal gait, Decreased activity tolerance, Decreased balance, Decreased mobility, Decreased endurance, Decreased range of motion, Difficulty walking, Increased edema, Impaired perceived functional ability, Impaired flexibility, Improper body mechanics, Pain, Obesity  Visit Diagnosis: Other abnormalities of gait and mobility  Muscle weakness (generalized)  Left knee pain, unspecified chronicity  Right knee pain, unspecified chronicity  Other symptoms and signs involving the musculoskeletal system     Problem List Patient Active Problem List   Diagnosis Date Noted  . Urinary tract infection without hematuria 02/27/2020  . Dysuria 02/27/2020  . Urge  incontinence 04/10/2018  . OAB (overactive bladder) 04/10/2018  . Urinary frequency 04/10/2018  . 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

## 2020-03-18 ENCOUNTER — Encounter (HOSPITAL_COMMUNITY): Payer: 59 | Admitting: Physical Therapy

## 2020-03-19 ENCOUNTER — Other Ambulatory Visit: Payer: 59 | Admitting: Adult Health

## 2020-03-20 ENCOUNTER — Ambulatory Visit (INDEPENDENT_AMBULATORY_CARE_PROVIDER_SITE_OTHER): Payer: 59 | Admitting: Orthopedic Surgery

## 2020-03-20 ENCOUNTER — Other Ambulatory Visit: Payer: Self-pay

## 2020-03-20 ENCOUNTER — Encounter: Payer: Self-pay | Admitting: Orthopedic Surgery

## 2020-03-20 VITALS — BP 170/94 | HR 92 | Ht 61.0 in | Wt 265.0 lb

## 2020-03-20 DIAGNOSIS — M25562 Pain in left knee: Secondary | ICD-10-CM

## 2020-03-20 DIAGNOSIS — M25561 Pain in right knee: Secondary | ICD-10-CM | POA: Diagnosis not present

## 2020-03-20 DIAGNOSIS — Z6841 Body Mass Index (BMI) 40.0 and over, adult: Secondary | ICD-10-CM

## 2020-03-20 DIAGNOSIS — G8929 Other chronic pain: Secondary | ICD-10-CM

## 2020-03-20 NOTE — Progress Notes (Signed)
Chief Complaint  Patient presents with  . Knee Pain    bilateral    Body mass index is 50.9 kg/m.  54 year old female had meniscal tear and arthritis chronic pain left knee was scheduled for possible surgery but then started having right knee pain worse than left and when we try to get an MRI right health would not allow without therapy while she went to therapy her knee still hurts she still needs an MRI  We did put her on hydrocodone loading to do physical therapy still having medial knee pain recommend MRI right knee prior to right knee surgery.  Right knee hurts worse than left now.  The patient meets the AMA guidelines for Morbid (severe) obesity with a BMI > 40.0 and I have recommended weight loss.

## 2020-03-20 NOTE — Patient Instructions (Signed)
MRI RT KNEE 

## 2020-04-09 ENCOUNTER — Ambulatory Visit (INDEPENDENT_AMBULATORY_CARE_PROVIDER_SITE_OTHER): Payer: 59 | Admitting: Adult Health

## 2020-04-09 ENCOUNTER — Encounter: Payer: Self-pay | Admitting: Adult Health

## 2020-04-09 ENCOUNTER — Other Ambulatory Visit (HOSPITAL_COMMUNITY)
Admission: RE | Admit: 2020-04-09 | Discharge: 2020-04-09 | Disposition: A | Discharge status: Home / Self Care | Payer: 59 | Source: Ambulatory Visit | Attending: Adult Health | Admitting: Adult Health

## 2020-04-09 ENCOUNTER — Other Ambulatory Visit: Payer: Self-pay

## 2020-04-09 ENCOUNTER — Ambulatory Visit (HOSPITAL_COMMUNITY)
Admission: RE | Admit: 2020-04-09 | Discharge: 2020-04-09 | Disposition: A | Discharge status: Home / Self Care | Payer: 59 | Source: Ambulatory Visit | Attending: Orthopedic Surgery | Admitting: Orthopedic Surgery

## 2020-04-09 VITALS — BP 154/80 | HR 94 | Ht 61.0 in | Wt 266.8 lb

## 2020-04-09 DIAGNOSIS — N3281 Overactive bladder: Secondary | ICD-10-CM | POA: Diagnosis not present

## 2020-04-09 DIAGNOSIS — M25561 Pain in right knee: Secondary | ICD-10-CM | POA: Insufficient documentation

## 2020-04-09 DIAGNOSIS — N3941 Urge incontinence: Secondary | ICD-10-CM | POA: Diagnosis not present

## 2020-04-09 DIAGNOSIS — Z1211 Encounter for screening for malignant neoplasm of colon: Secondary | ICD-10-CM

## 2020-04-09 DIAGNOSIS — Z01419 Encounter for gynecological examination (general) (routine) without abnormal findings: Secondary | ICD-10-CM | POA: Diagnosis not present

## 2020-04-09 DIAGNOSIS — G8929 Other chronic pain: Secondary | ICD-10-CM

## 2020-04-09 LAB — HEMOCCULT GUIAC POC 1CARD (OFFICE): Fecal Occult Blood, POC: NEGATIVE

## 2020-04-09 MED ORDER — GEMTESA 75 MG PO TABS: ORAL_TABLET | ORAL | 0 refills | Status: DC

## 2020-04-09 NOTE — Progress Notes (Signed)
Patient ID: Kristine Montgomery, female   DOB: [birth year], 54 y.o.   MRN: [id number] History of Present Illness: Kristine Montgomery is a 54 year old white female, single with SO, PM in for well woman gyn exam and pap. She just had MRI on right knee. PCP is Dr Sherwood Gambler.   Current Medications, Allergies, Past Medical History, Past Surgical History, Family History and Social History were reviewed in [employer] electronic medical record.     Review of Systems: Patient denies any headaches, hearing loss, fatigue, blurred vision, shortness of breath, chest pain, abdominal pain, problems with bowel movements, or intercourse. No mood swings. +pain in knees Has to get up about 5 x at night too pee and has UI She is a smoker and has cut down but not ready to quit.   Physical Exam:BP (!) 154/80 (BP Location: Right Arm, Patient Position: Sitting, Cuff Size: Large)   Pulse 94   Ht 5\' 1"  (1.549 m)   Wt 266 lb 12.8 oz (121 kg)   LMP 06/27/2017 (Within Weeks)   BMI 50.41 kg/m  General:  Well developed, well nourished, no acute distress Skin:  Warm and dry Neck:  Midline trachea, normal thyroid, good ROM, no lymphadenopathy Lungs; Clear to auscultation bilaterally Breast:  No dominant palpable mass, retraction, or nipple discharge Cardiovascular: Regular rate and rhythm Abdomen:  Soft, non tender, no hepatosplenomegaly Pelvic:  External genitalia is normal in appearance,she has numerous epidermal cyst on vulva.  The vagina is normal is pale with loss of moisture and rugae. Urethra has no lesions or masses. The cervix is bulbous.Ppa with high risk HPV genotyping performed.  Uterus is felt to be normal size, shape, and contour, but difficult secondary to abdominal girth. No adnexal masses or tenderness noted.Bladder is non tender, no masses felt. Rectal: Good sphincter tone, no polyps, or hemorrhoids felt.  Hemoccult negative. Extremities/musculoskeletal:  No swelling or varicosities noted, no clubbing or  cyanosis Psych:  No mood changes, alert and cooperative,seems happy AA is 2 Fall risk is low, but she is using a cane today PHQ 9 score is 3, no SI  [facility] - 04/09/20 1544      Pregnancy Intention Screening   Does the patient want to become pregnant in the next year? No    Does the patient's partner want to become pregnant in the next year? No    Would the patient like to discuss contraceptive options today? No      Contraception Wrap Up   Current Method No Method - Other Reason   post-menopausal   End Method No Method - Other Reason   post-menopausal   Contraception Counseling Provided No         Examination chaperoned by 04/11/20.  Impression and Plan: 1. Encounter for gynecological examination with Papanicolaou smear of cervix Pap sent Physical in 1 year Pap in 3 if normal Mammogram yearly She is getting cologuard to do per PCP Labs with PCP Follow up with Dr Lucia Bitter on knees  2. Encounter for screening fecal occult blood testing  3. OAB (overactive bladder) I gave her 42 sample tablets of Gemtesa 75 mg 1 daily  4. Urge incontinence Will try Romeo Apple

## 2020-04-18 LAB — CYTOLOGY - PAP
Comment: NEGATIVE
Diagnosis: NEGATIVE
High risk HPV: NEGATIVE

## 2020-04-21 ENCOUNTER — Other Ambulatory Visit: Payer: Self-pay

## 2020-04-21 ENCOUNTER — Encounter: Payer: Self-pay | Admitting: Orthopedic Surgery

## 2020-04-21 ENCOUNTER — Other Ambulatory Visit: Payer: Self-pay | Admitting: Internal Medicine

## 2020-04-21 ENCOUNTER — Ambulatory Visit (INDEPENDENT_AMBULATORY_CARE_PROVIDER_SITE_OTHER): Payer: 59 | Admitting: Orthopedic Surgery

## 2020-04-21 ENCOUNTER — Other Ambulatory Visit (HOSPITAL_COMMUNITY): Payer: Self-pay | Admitting: Internal Medicine

## 2020-04-21 VITALS — BP 146/86 | HR 116 | Ht 61.0 in | Wt 266.0 lb

## 2020-04-21 DIAGNOSIS — M23321 Other meniscus derangements, posterior horn of medial meniscus, right knee: Secondary | ICD-10-CM

## 2020-04-21 DIAGNOSIS — M25561 Pain in right knee: Secondary | ICD-10-CM

## 2020-04-21 DIAGNOSIS — G8929 Other chronic pain: Secondary | ICD-10-CM

## 2020-04-21 DIAGNOSIS — Z6841 Body Mass Index (BMI) 40.0 and over, adult: Secondary | ICD-10-CM

## 2020-04-21 DIAGNOSIS — M5412 Radiculopathy, cervical region: Secondary | ICD-10-CM

## 2020-04-21 DIAGNOSIS — M1711 Unilateral primary osteoarthritis, right knee: Secondary | ICD-10-CM

## 2020-04-21 NOTE — Patient Instructions (Signed)
Meniscus Injury, Arthroscopy   Arthroscopy is a surgical procedure that involves the use of a small scope that has a camera and surgical instruments on the end (arthroscope). An arthroscope can be used to repair your meniscus injury.  LET YOUR HEALTH CARE PROVIDER KNOW ABOUT:  Any allergies you have.  All medicines you are taking, including vitamins, herbs, eyedrops, creams, and over-the-counter medicines.  Any recent colds or infections you have had or currently have.  Previous problems you or members of your family have had with the use of anesthetics.  Any blood disorders or blood clotting problems you have.  Previous surgeries you have had.  Medical conditions you have. RISKS AND COMPLICATIONS Generally, this is a safe procedure. However, as with any procedure, problems can occur. Possible problems include:  Damage to nerves or blood vessels.  Excess bleeding.  Blood clots.  Infection. BEFORE THE PROCEDURE  Do not eat or drink for 6-8 hours before the procedure.  Take medicines as directed by your surgeon. Ask your surgeon about changing or stopping your regular medicines.  You may have lab tests the morning of surgery. PROCEDURE  You will be given one of the following:   A medicine that numbs the area (local anesthesia).  A medicine that makes you go to sleep (general anesthesia).  A medicine injected into your spine that numbs your body below the waist (spinal anesthesia). Most often, several small cuts (incisions) are made in the knee. The arthroscope and instruments go into the incisions to repair the damage. The torn portion of the meniscus is removed.   AFTER THE PROCEDURE  You will be taken to the recovery area where your progress will be monitored. When you are awake, stable, and taking fluids without complications, you will be allowed to go home. This is usually the same day. A torn or stretched ligament (ligament sprain) may take 6-8 weeks to heal.   It  takes about the 4-6 WEEKS if your surgeon removed a torn meniscus.  A repaired meniscus may require 6-12 weeks of recovery time.  A torn ligament needing reconstructive surgery may take 6-12 months to heal fully.   This information is not intended to replace advice given to you by your health care provider. Make sure you discuss any questions you have with your health care provider. You have decided to proceed with operative arthroscopy of the knee. You have decided not to continue with nonoperative measures such as but not limited to oral medication, weight loss, activity modification, physical therapy, bracing, or injection.  We will perform operative arthroscopy of the knee. Some of the risks associated with arthroscopic surgery of the knee include but are not limited to Bleeding Infection Swelling Stiffness Blood clot Pain Need for knee replacement surgery    In compliance with recent [place] law in federal regulation regarding opioid use and abuse and addiction, we will taper (stop) opioid medication after 2 weeks.  If you're not comfortable with these risks and would like to continue with nonoperative treatment please let Dr. Rissa Turley know prior to your surgery. 

## 2020-04-28 ENCOUNTER — Encounter (HOSPITAL_COMMUNITY): Payer: Self-pay

## 2020-04-28 ENCOUNTER — Ambulatory Visit (HOSPITAL_COMMUNITY): Payer: 59

## 2020-05-20 ENCOUNTER — Telehealth: Payer: Self-pay | Admitting: Orthopedic Surgery

## 2020-05-20 NOTE — Telephone Encounter (Signed)
Noted  

## 2020-05-20 NOTE — Telephone Encounter (Signed)
No prior auth needed for knee scope, [place] Medicaid.

## 2020-05-20 NOTE — Telephone Encounter (Signed)
I can certainly try. Just to verify it is for [id number]?

## 2020-05-20 NOTE — Telephone Encounter (Signed)
Can you work on this authorization for the surgery on 05/23/2020?

## 2020-05-21 ENCOUNTER — Other Ambulatory Visit: Payer: Self-pay

## 2020-05-21 ENCOUNTER — Encounter (HOSPITAL_COMMUNITY): Payer: Self-pay

## 2020-05-21 ENCOUNTER — Encounter (HOSPITAL_COMMUNITY)
Admission: RE | Admit: 2020-05-21 | Discharge: 2020-05-21 | Disposition: A | Discharge status: Home / Self Care | Payer: Self-pay | Source: Ambulatory Visit | Attending: Orthopedic Surgery | Admitting: Orthopedic Surgery

## 2020-05-21 ENCOUNTER — Other Ambulatory Visit (HOSPITAL_COMMUNITY)
Admission: RE | Admit: 2020-05-21 | Discharge: 2020-05-21 | Disposition: A | Discharge status: Home / Self Care | Payer: Self-pay | Source: Ambulatory Visit | Attending: Orthopedic Surgery | Admitting: Orthopedic Surgery

## 2020-05-21 DIAGNOSIS — Z20822 Contact with and (suspected) exposure to covid-19: Secondary | ICD-10-CM | POA: Insufficient documentation

## 2020-05-21 DIAGNOSIS — Z01818 Encounter for other preprocedural examination: Secondary | ICD-10-CM | POA: Insufficient documentation

## 2020-05-21 HISTORY — DX: Unspecified asthma, uncomplicated: J45.909

## 2020-05-21 LAB — CBC WITH DIFFERENTIAL/PLATELET
Abs Immature Granulocytes: 0.05 10*3/uL (ref 0.00–0.07)
Basophils Absolute: 0 10*3/uL (ref 0.0–0.1)
Basophils Relative: 0 %
Eosinophils Absolute: 0.2 10*3/uL (ref 0.0–0.5)
Eosinophils Relative: 1 %
HCT: 44.3 % (ref 36.0–46.0)
Hemoglobin: 14.2 g/dL (ref 12.0–15.0)
Immature Granulocytes: 0 %
Lymphocytes Relative: 20 %
Lymphs Abs: 2.4 10*3/uL (ref 0.7–4.0)
MCH: 29.3 pg (ref 26.0–34.0)
MCHC: 32.1 g/dL (ref 30.0–36.0)
MCV: 91.3 fL (ref 80.0–100.0)
Monocytes Absolute: 0.9 10*3/uL (ref 0.1–1.0)
Monocytes Relative: 7 %
Neutro Abs: 8.4 10*3/uL — ABNORMAL HIGH (ref 1.7–7.7)
Neutrophils Relative %: 72 %
Platelets: 304 10*3/uL (ref 150–400)
RBC: 4.85 MIL/uL (ref 3.87–5.11)
RDW: 14.2 % (ref 11.5–15.5)
WBC: 11.9 10*3/uL — ABNORMAL HIGH (ref 4.0–10.5)
nRBC: 0 % (ref 0.0–0.2)

## 2020-05-21 LAB — BASIC METABOLIC PANEL
Anion gap: 10 (ref 5–15)
BUN: 17 mg/dL (ref 6–20)
CO2: 25 mmol/L (ref 22–32)
Calcium: 9.1 mg/dL (ref 8.9–10.3)
Chloride: 104 mmol/L (ref 98–111)
Creatinine, Ser: 0.49 mg/dL (ref 0.44–1.00)
GFR, Estimated: >60 mL/min (ref >60)
Glucose, Bld: 127 mg/dL — ABNORMAL HIGH (ref 70–99)
Potassium: 3.7 mmol/L (ref 3.5–5.1)
Sodium: 139 mmol/L (ref 135–145)

## 2020-05-21 NOTE — Patient Instructions (Signed)
Kristine Montgomery  05/21/2020     @PREFPERIOPPHARMACY @   Your procedure is scheduled on  05/23/2020.  Report to 07/21/2020 at  0710  A.M.  Call this number if you have problems the morning of surgery:  [phone]   Remember:  Do not eat or drink after midnight.                        Take these medicines the morning of surgery with A SIP OF WATER  lodine or hydrocodone (if needed), gabapentin, gemtesa.    Do not wear jewelry, make-up or nail polish.  Do not wear lotions, powders, or perfumes, or deodorant.  Do not shave 48 hours prior to surgery.  Men may shave face and neck.  Do not bring valuables to the hospital.  [facility] is not responsible for any belongings or valuables.  Contacts, dentures or bridgework may not be worn into surgery.  Leave your suitcase in the car.  After surgery it may be brought to your room.  For patients admitted to the hospital, discharge time will be determined by your treatment team.  Patients discharged the day of surgery will not be allowed to drive home.   Name and phone number of your driver:   family Special instructions:  DO NOT smoke the morning of your procedure.  Please read over the following fact sheets that you were given. Anesthesia Post-op Instructions and Care and Recovery After Surgery       Arthroscopic Knee Ligament Repair, Care After This sheet gives you information about how to care for yourself after your procedure. Your health care provider may also give you more specific instructions. If you have problems or questions, contact your health care provider. What can I expect after the procedure? After the procedure, it is common to have:  Pain in your knee.  Bruising and swelling on your knee, calf, and ankle for 3-4 days.  Fatigue. Follow these instructions at home: If you have a brace or immobilizer:  Wear the brace or immobilizer as told by your health care provider. Remove it only as told by your  health care provider.  Loosen the splint or immobilizer if your toes tingle, become numb, or turn cold and blue.  Keep the brace or immobilizer clean. Bathing  Do not take baths, swim, or use a hot tub until your health care provider approves. Ask your health care provider if you can take showers.  Keep your bandage (dressing) dry until your health care provider says that it can be removed. Cover it and your brace or immobilizer with a watertight covering when you take a shower. Incision care   Follow instructions from your health care provider about how to take care of your incision. Make sure you: ? Wash your hands with soap and water before you change your bandage (dressing). If soap and water are not available, use hand sanitizer. ? Change your dressing as told by your health care provider. ? Leave stitches (sutures), skin glue, or adhesive strips in place. These skin closures may need to stay in place for 2 weeks or longer. If adhesive strip edges start to loosen and curl up, you may trim the loose edges. Do not remove adhesive strips completely unless your health care provider tells you to do that.  Check your incision area every day for signs of infection. Check for: ? More redness, swelling, or pain. ?  More fluid or blood. ? Warmth. ? Pus or a bad smell. Managing pain, stiffness, and swelling   If directed, put ice on the affected area. ? If you have a removable brace or immobilizer, remove it as told by your health care provider. ? Put ice in a plastic bag. ? Place a towel between your skin and the bag or between your brace or immobilizer and the bag. ? Leave the ice on for 20 minutes, 2-3 times a day.  Move your toes often to avoid stiffness and to lessen swelling.  Raise (elevate) the injured area above the level of your heart while you are sitting or lying down. Driving  Do not drive until your health care provider approves. If you have a brace or immobilizer on your  leg, ask your health care provider when it is safe for you to drive.  Do not drive or use heavy machinery while taking prescription pain medicine. Activity  Rest as directed. Ask your health care provider what activities are safe for you.  Do physical therapy exercises as told by your health care provider. Physical therapy will help you regain strength and motion in your knee.  Follow instructions from your health care provider about: ? When you may start motion exercises. ? When you may start riding a stationary bike and doing other low-impact activities. ? When you may start to jog and do other high-impact activities. Safety  Do not use the injured limb to support your body weight until your health care provider says that you can. Use crutches as told by your health care provider. General instructions  Do not use any products that contain nicotine or tobacco, such as cigarettes and e-cigarettes. These can delay bone healing. If you need help quitting, ask your health care provider.  To prevent or treat constipation while you are taking prescription pain medicine, your health care provider may recommend that you: ? Drink enough fluid to keep your urine clear or pale yellow. ? Take over-the-counter or prescription medicines. ? Eat foods that are high in fiber, such as fresh fruits and vegetables, whole grains, and beans. ? Limit foods that are high in fat and processed sugars, such as fried and sweet foods.  Take over-the-counter and prescription medicines only as told by your health care provider.  Keep all follow-up visits as told by your health care provider. This is important. Contact a health care provider if:  You have more redness, swelling, or pain around an incision.  You have more fluid or blood coming from an incision.  Your incision feels warm to the touch.  You have a fever.  You have pain or swelling in your knee, and it gets worse.  You have pain that does not  get better with medicine. Get help right away if:  You have trouble breathing.  You have pus or a bad smell coming from an incision.  You have numbness and tingling near the knee joint. Summary  After the procedure, it is common to have knee pain with bruising and swelling on your knee, calf, and ankle.  Icing your knee and raising your leg above the level of your heart will help control the pain and the swelling.  Do physical therapy exercises as told by your health care provider. Physical therapy will help you regain strength and motion in your knee. This information is not intended to replace advice given to you by your health care provider. Make sure you discuss any questions you  have with your health care provider. Document Revised: 04/15/2017 Document Reviewed: 04/27/2016 Elsevier Patient Education  2020 [place] Anesthesia, Adult, Care After This sheet gives you information about how to care for yourself after your procedure. Your health care provider may also give you more specific instructions. If you have problems or questions, contact your health care provider. What can I expect after the procedure? After the procedure, the following side effects are common:  Pain or discomfort at the IV site.  Nausea.  Vomiting.  Sore throat.  Trouble concentrating.  Feeling cold or chills.  Weak or tired.  Sleepiness and fatigue.  Soreness and body aches. These side effects can affect parts of the body that were not involved in surgery. Follow these instructions at home:  For at least 24 hours after the procedure:  Have a responsible adult stay with you. It is important to have someone help care for you until you are awake and alert.  Rest as needed.  Do not: ? Participate in activities in which you could fall or become injured. ? Drive. ? Use heavy machinery. ? Drink alcohol. ? Take sleeping pills or medicines that cause drowsiness. ? Make important  decisions or sign legal documents. ? Take care of children on your own. Eating and drinking  Follow any instructions from your health care provider about eating or drinking restrictions.  When you feel hungry, start by eating small amounts of foods that are soft and easy to digest (bland), such as toast. Gradually return to your regular diet.  Drink enough fluid to keep your urine pale yellow.  If you vomit, rehydrate by drinking water, juice, or clear broth. General instructions  If you have sleep apnea, surgery and certain medicines can increase your risk for breathing problems. Follow instructions from your health care provider about wearing your sleep device: ? Anytime you are sleeping, including during daytime naps. ? While taking prescription pain medicines, sleeping medicines, or medicines that make you drowsy.  Return to your normal activities as told by your health care provider. Ask your health care provider what activities are safe for you.  Take over-the-counter and prescription medicines only as told by your health care provider.  If you smoke, do not smoke without supervision.  Keep all follow-up visits as told by your health care provider. This is important. Contact a health care provider if:  You have nausea or vomiting that does not get better with medicine.  You cannot eat or drink without vomiting.  You have pain that does not get better with medicine.  You are unable to pass urine.  You develop a skin rash.  You have a fever.  You have redness around your IV site that gets worse. Get help right away if:  You have difficulty breathing.  You have chest pain.  You have blood in your urine or stool, or you vomit blood. Summary  After the procedure, it is common to have a sore throat or nausea. It is also common to feel tired.  Have a responsible adult stay with you for the first 24 hours after general anesthesia. It is important to have someone help  care for you until you are awake and alert.  When you feel hungry, start by eating small amounts of foods that are soft and easy to digest (bland), such as toast. Gradually return to your regular diet.  Drink enough fluid to keep your urine pale yellow.  Return to your normal activities as  told by your health care provider. Ask your health care provider what activities are safe for you. This information is not intended to replace advice given to you by your health care provider. Make sure you discuss any questions you have with your health care provider. Document Revised: 05/06/2017 Document Reviewed: 12/17/2016 Elsevier Patient Education  [place]. How to Use Chlorhexidine for Bathing Chlorhexidine gluconate (CHG) is a germ-killing (antiseptic) solution that is used to clean the skin. It can get rid of the bacteria that normally live on the skin and can keep them away for about 24 hours. To clean your skin with CHG, you may be given:  A CHG solution to use in the shower or as part of a sponge bath.  A prepackaged cloth that contains CHG. Cleaning your skin with CHG may help lower the risk for infection:  While you are staying in the intensive care unit of the hospital.  If you have a vascular access, such as a central line, to provide short-term or long-term access to your veins.  If you have a catheter to drain urine from your bladder.  If you are on a ventilator. A ventilator is a machine that helps you breathe by moving air in and out of your lungs.  After surgery. What are the risks? Risks of using CHG include:  A skin reaction.  Hearing loss, if CHG gets in your ears.  Eye injury, if CHG gets in your eyes and is not rinsed out.  The CHG product catching fire. Make sure that you avoid smoking and flames after applying CHG to your skin. Do not use CHG:  If you have a chlorhexidine allergy or have previously reacted to chlorhexidine.  On babies younger than 58  months of age. How to use CHG solution  Use CHG only as told by your health care provider, and follow the instructions on the label.  Use the full amount of CHG as directed. Usually, this is one bottle. During a shower Follow these steps when using CHG solution during a shower (unless your health care provider gives you different instructions): 1. Start the shower. 2. Use your normal soap and shampoo to wash your face and hair. 3. Turn off the shower or move out of the shower stream. 4. Pour the CHG onto a clean washcloth. Do not use any type of brush or rough-edged sponge. 5. Starting at your neck, lather your body down to your toes. Make sure you follow these instructions: ? If you will be having surgery, pay special attention to the part of your body where you will be having surgery. Scrub this area for at least 1 minute. ? Do not use CHG on your head or face. If the solution gets into your ears or eyes, rinse them well with water. ? Avoid your genital area. ? Avoid any areas of skin that have broken skin, cuts, or scrapes. ? Scrub your back and under your arms. Make sure to wash skin folds. 6. Let the lather sit on your skin for 1-2 minutes or as long as told by your health care provider. 7. Thoroughly rinse your entire body in the shower. Make sure that all body creases and crevices are rinsed well. 8. Dry off with a clean towel. Do not put any substances on your body afterward-such as powder, lotion, or perfume-unless you are told to do so by your health care provider. Only use lotions that are recommended by the manufacturer. 9. Put on clean  clothes or pajamas. 10. If it is the night before your surgery, sleep in clean sheets.  During a sponge bath Follow these steps when using CHG solution during a sponge bath (unless your health care provider gives you different instructions): 1. Use your normal soap and shampoo to wash your face and hair. 2. Pour the CHG onto a clean  washcloth. 3. Starting at your neck, lather your body down to your toes. Make sure you follow these instructions: ? If you will be having surgery, pay special attention to the part of your body where you will be having surgery. Scrub this area for at least 1 minute. ? Do not use CHG on your head or face. If the solution gets into your ears or eyes, rinse them well with water. ? Avoid your genital area. ? Avoid any areas of skin that have broken skin, cuts, or scrapes. ? Scrub your back and under your arms. Make sure to wash skin folds. 4. Let the lather sit on your skin for 1-2 minutes or as long as told by your health care provider. 5. Using a different clean, wet washcloth, thoroughly rinse your entire body. Make sure that all body creases and crevices are rinsed well. 6. Dry off with a clean towel. Do not put any substances on your body afterward-such as powder, lotion, or perfume-unless you are told to do so by your health care provider. Only use lotions that are recommended by the manufacturer. 7. Put on clean clothes or pajamas. 8. If it is the night before your surgery, sleep in clean sheets. How to use CHG prepackaged cloths  Only use CHG cloths as told by your health care provider, and follow the instructions on the label.  Use the CHG cloth on clean, dry skin.  Do not use the CHG cloth on your head or face unless your health care provider tells you to.  When washing with the CHG cloth: ? Avoid your genital area. ? Avoid any areas of skin that have broken skin, cuts, or scrapes. Before surgery Follow these steps when using a CHG cloth to clean before surgery (unless your health care provider gives you different instructions): 1. Using the CHG cloth, vigorously scrub the part of your body where you will be having surgery. Scrub using a back-and-forth motion for 3 minutes. The area on your body should be completely wet with CHG when you are done scrubbing. 2. Do not rinse. Discard  the cloth and let the area air-dry. Do not put any substances on the area afterward, such as powder, lotion, or perfume. 3. Put on clean clothes or pajamas. 4. If it is the night before your surgery, sleep in clean sheets.  For general bathing Follow these steps when using CHG cloths for general bathing (unless your health care provider gives you different instructions). 1. Use a separate CHG cloth for each area of your body. Make sure you wash between any folds of skin and between your fingers and toes. Wash your body in the following order, switching to a new cloth after each step: ? The front of your neck, shoulders, and chest. ? Both of your arms, under your arms, and your hands. ? Your stomach and groin area, avoiding the genitals. ? Your right leg and foot. ? Your left leg and foot. ? The back of your neck, your back, and your buttocks. 2. Do not rinse. Discard the cloth and let the area air-dry. Do not put any substances on your  body afterward-such as powder, lotion, or perfume-unless you are told to do so by your health care provider. Only use lotions that are recommended by the manufacturer. 3. Put on clean clothes or pajamas. Contact a health care provider if:  Your skin gets irritated after scrubbing.  You have questions about using your solution or cloth. Get help right away if:  Your eyes become very red or swollen.  Your eyes itch badly.  Your skin itches badly and is red or swollen.  Your hearing changes.  You have trouble seeing.  You have swelling or tingling in your mouth or throat.  You have trouble breathing.  You swallow any chlorhexidine. Summary  Chlorhexidine gluconate (CHG) is a germ-killing (antiseptic) solution that is used to clean the skin. Cleaning your skin with CHG may help to lower your risk for infection.  You may be given CHG to use for bathing. It may be in a bottle or in a prepackaged cloth to use on your skin. Carefully follow your health  care provider's instructions and the instructions on the product label.  Do not use CHG if you have a chlorhexidine allergy.  Contact your health care provider if your skin gets irritated after scrubbing. This information is not intended to replace advice given to you by your health care provider. Make sure you discuss any questions you have with your health care provider. Document Revised: 07/20/2018 Document Reviewed: 03/31/2017 Elsevier Patient Education  2020 [employer].

## 2020-05-22 LAB — SARS CORONAVIRUS 2 (TAT 6-24 HRS): SARS Coronavirus 2: NEGATIVE

## 2020-05-22 NOTE — H&P (Signed)
History and physical for outpatient surgery   Arthroscopy right knee partial medial meniscectomy   Chief Complaint  Patient presents with  . Results      review MRI right knee has pain behind knee right also left knee pain     The patient meets the AMA guidelines for Morbid (severe) obesity with a BMI > 40.0 and I have recommended weight loss.   BP (!) 146/86   Pulse (!) 116   Ht 5\' 1"  (1.549 m)   Wt 266 lb (120.7 kg)   LMP 06/27/2017 (Within Weeks)   BMI 50.26 kg/m        Encounter Diagnoses  Name Primary?  . Chronic pain of right knee Yes  . Body mass index 50.0-59.9, adult (HCC)    . Morbid obesity (HCC)    . Primary osteoarthritis of right knee    . Derangement of posterior horn of medial meniscus of right knee        HPI: The patient is here TO DISCUSS THE RESULTS OF an MRI   HPI 55 year old female with chronic right knee pain sent for MRI due to lack of improvement with conservative care still complains of medial knee pain and posterior knee pain pain down the back of her leg   ROS Occasional shortness of breath no chest pain   BP (!) 146/86   Pulse (!) 116   Ht 5\' 1"  (1.549 m)   Wt 266 lb (120.7 kg)   LMP 06/27/2017 (Within Weeks)   BMI 50.26 kg/m        Medical decision-making section     DATA  MRI REPORT:   MENISCI   Medial meniscus: There is a partial thickness radial tear involving the posterior horn back near the meniscal root with partial detachment and medial protrusion of the meniscus estimated at 5 mm. Associated intrasubstance degenerative type signal changes.   Lateral meniscus:  Intact   LIGAMENTS   Cruciates:  Intact   Collaterals:  Intact.  MCL and pes anserine bursitis noted.   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   Medial: Advanced degenerative chondrosis with areas of full-thickness cartilage loss,  joint space narrowing and spurring.   Lateral: Moderate to advanced degenerative chondrosis with areas of near full-thickness cartilage loss and spurring.   Joint:  Moderate-sized joint effusion and moderate synovitis.   Popliteal Fossa:  No popliteal mass.  Small Baker's cyst.   Extensor Mechanism: The patella retinacular structures are intact and the quadriceps and patellar tendons are intact.   Bones: No acute bony findings. There is a multi septated cystic lesion in the metadiaphyseal region of the femur which is most likely an intraosseous ganglion.   Other: Unremarkable knee musculature.   IMPRESSION: 1. Partial thickness radial tear involving the posterior horn of the medial meniscus back near the meniscal root with partial detachment and medial protrusion of the meniscus estimated at 5 mm. 2. Intact ligamentous structures and no acute bony findings. 3. Tricompartmental degenerative changes as described above. 4. Moderate-sized joint effusion and moderate synovitis. Small Baker's cyst.     Electronically Signed   By: M.D.   On: 04/10/2020 11:28       MY READING: MRI OF THE     #1 torn medial meniscus #2 3 compartment degenerative arthritis Synovitis #3 #4 Baker's cyst         Encounter Diagnoses  Name  Primary?  . Chronic pain of right knee Yes  . Body mass index 50.0-59.9, adult (HCC)    . Morbid obesity (HCC)    . Primary osteoarthritis of right knee    . Derangement of posterior horn of medial meniscus of right knee       Arthroscopy right knee partial medial meniscectomy     PLAN:  The procedure has been fully reviewed with the patient; The risks and benefits of surgery have been discussed and explained and understood. Alternative treatment has also been reviewed, questions were encouraged and answered. The postoperative plan is also been reviewed.   She wishes to proceed with arthroscopy of the right knee she is questioning whether the  pain in the back of the leg will improve it may or may not depending on if it is coming from the Baker's cyst of the pressure in the knee could be coming from her back this was discussed

## 2020-05-23 ENCOUNTER — Encounter: Payer: Self-pay | Admitting: Orthopedic Surgery

## 2020-05-23 ENCOUNTER — Ambulatory Visit (HOSPITAL_COMMUNITY)
Admission: RE | Admit: 2020-05-23 | Discharge: 2020-05-23 | Disposition: A | Discharge status: Home / Self Care | Payer: Medicaid Other | Attending: Orthopedic Surgery | Admitting: Orthopedic Surgery

## 2020-05-23 ENCOUNTER — Ambulatory Visit (HOSPITAL_COMMUNITY): Payer: Medicaid Other

## 2020-05-23 ENCOUNTER — Encounter (HOSPITAL_COMMUNITY): Payer: Self-pay | Admitting: Orthopedic Surgery

## 2020-05-23 ENCOUNTER — Other Ambulatory Visit: Payer: Self-pay

## 2020-05-23 ENCOUNTER — Encounter (HOSPITAL_COMMUNITY)
Admission: RE | Disposition: A | Discharge status: Home / Self Care | Payer: Self-pay | Source: Home / Self Care | Attending: Orthopedic Surgery

## 2020-05-23 DIAGNOSIS — Z6841 Body Mass Index (BMI) 40.0 and over, adult: Secondary | ICD-10-CM | POA: Diagnosis not present

## 2020-05-23 DIAGNOSIS — M23200 Derangement of unspecified lateral meniscus due to old tear or injury, right knee: Secondary | ICD-10-CM

## 2020-05-23 DIAGNOSIS — M1711 Unilateral primary osteoarthritis, right knee: Secondary | ICD-10-CM

## 2020-05-23 DIAGNOSIS — S83281A Other tear of lateral meniscus, current injury, right knee, initial encounter: Secondary | ICD-10-CM | POA: Insufficient documentation

## 2020-05-23 DIAGNOSIS — M65161 Other infective (teno)synovitis, right knee: Secondary | ICD-10-CM

## 2020-05-23 DIAGNOSIS — X58XXXA Exposure to other specified factors, initial encounter: Secondary | ICD-10-CM | POA: Insufficient documentation

## 2020-05-23 DIAGNOSIS — M2540 Effusion, unspecified joint: Secondary | ICD-10-CM | POA: Insufficient documentation

## 2020-05-23 DIAGNOSIS — S83241A Other tear of medial meniscus, current injury, right knee, initial encounter: Secondary | ICD-10-CM | POA: Diagnosis not present

## 2020-05-23 DIAGNOSIS — S83209A Unspecified tear of unspecified meniscus, current injury, unspecified knee, initial encounter: Secondary | ICD-10-CM

## 2020-05-23 DIAGNOSIS — M659 Synovitis and tenosynovitis, unspecified: Secondary | ICD-10-CM | POA: Diagnosis not present

## 2020-05-23 DIAGNOSIS — M23203 Derangement of unspecified medial meniscus due to old tear or injury, right knee: Secondary | ICD-10-CM

## 2020-05-23 HISTORY — PX: KNEE ARTHROSCOPY WITH MEDIAL MENISECTOMY: SHX5651

## 2020-05-23 SURGERY — ARTHROSCOPY, KNEE, WITH MEDIAL MENISCECTOMY
Anesthesia: General | Site: Knee | Laterality: Right

## 2020-05-23 MED ORDER — ONDANSETRON HCL 4 MG/2ML IJ SOLN
4.0000 mg | Freq: Once | INTRAMUSCULAR | Status: DC
  Filled 2020-05-23: qty 2

## 2020-05-23 MED ORDER — BUPIVACAINE-EPINEPHRINE (PF) 0.5% -1:200000 IJ SOLN
INTRAMUSCULAR | Status: DC | PRN
  Administered 2020-05-23: 60 mL via PERINEURAL

## 2020-05-23 MED ORDER — PROMETHAZINE HCL 12.5 MG PO TABS: 12.5000 mg | ORAL_TABLET | Freq: Four times a day (QID) | ORAL | 0 refills | Status: DC | PRN

## 2020-05-23 MED ORDER — FENTANYL CITRATE (PF) 100 MCG/2ML IJ SOLN
INTRAMUSCULAR | Status: AC
  Filled 2020-05-23: qty 2

## 2020-05-23 MED ORDER — SODIUM CHLORIDE 0.9 % IR SOLN
Status: DC | PRN
  Administered 2020-05-23: 1000 mL

## 2020-05-23 MED ORDER — DEXAMETHASONE SODIUM PHOSPHATE 10 MG/ML IJ SOLN
INTRAMUSCULAR | Status: AC
  Filled 2020-05-23: qty 1

## 2020-05-23 MED ORDER — LIDOCAINE HCL (CARDIAC) PF 100 MG/5ML IV SOSY
PREFILLED_SYRINGE | INTRAVENOUS | Status: DC | PRN
  Administered 2020-05-23: 100 mg via INTRAVENOUS

## 2020-05-23 MED ORDER — IBUPROFEN 800 MG PO TABS
800.0000 mg | ORAL_TABLET | Freq: Once | ORAL | Status: AC
  Administered 2020-05-23: 800 mg via ORAL
  Filled 2020-05-23: qty 1

## 2020-05-23 MED ORDER — PROPOFOL 10 MG/ML IV BOLUS
INTRAVENOUS | Status: AC
  Filled 2020-05-23: qty 20

## 2020-05-23 MED ORDER — DEXTROSE 5 % IV SOLN
3.0000 g | INTRAVENOUS | Status: AC
  Administered 2020-05-23: 3 g via INTRAVENOUS
  Filled 2020-05-23: qty 3000

## 2020-05-23 MED ORDER — ONDANSETRON HCL 4 MG/2ML IJ SOLN
INTRAMUSCULAR | Status: AC
  Filled 2020-05-23: qty 2

## 2020-05-23 MED ORDER — LIDOCAINE HCL (PF) 2 % IJ SOLN
INTRAMUSCULAR | Status: AC
  Filled 2020-05-23: qty 5

## 2020-05-23 MED ORDER — DEXAMETHASONE SODIUM PHOSPHATE 10 MG/ML IJ SOLN
INTRAMUSCULAR | Status: DC | PRN
  Administered 2020-05-23: 5 mg via INTRAVENOUS

## 2020-05-23 MED ORDER — BUPIVACAINE-EPINEPHRINE (PF) 0.5% -1:200000 IJ SOLN
INTRAMUSCULAR | Status: AC
  Filled 2020-05-23: qty 60

## 2020-05-23 MED ORDER — OXYCODONE-ACETAMINOPHEN 5-325 MG PO TABS
1.0000 | ORAL_TABLET | ORAL | Status: DC | PRN
Start: 2020-05-23 — End: 2020-05-23
  Administered 2020-05-23: 1 via ORAL
  Filled 2020-05-23: qty 1

## 2020-05-23 MED ORDER — OXYCODONE-ACETAMINOPHEN 5-325 MG PO TABS: 1.0000 | ORAL_TABLET | Freq: Four times a day (QID) | ORAL | 0 refills | Status: AC | PRN

## 2020-05-23 MED ORDER — LACTATED RINGERS IV SOLN: INTRAVENOUS | Status: DC

## 2020-05-23 MED ORDER — CHLORHEXIDINE GLUCONATE 0.12 % MT SOLN
OROMUCOSAL | Status: AC
  Filled 2020-05-23: qty 15

## 2020-05-23 MED ORDER — MIDAZOLAM HCL 2 MG/2ML IJ SOLN
INTRAMUSCULAR | Status: AC
  Filled 2020-05-23: qty 2

## 2020-05-23 MED ORDER — ORAL CARE MOUTH RINSE: 15.0000 mL | Freq: Once | OROMUCOSAL | Status: DC

## 2020-05-23 MED ORDER — FENTANYL CITRATE (PF) 250 MCG/5ML IJ SOLN
INTRAMUSCULAR | Status: DC | PRN
  Administered 2020-05-23: 50 ug via INTRAVENOUS
  Administered 2020-05-23: 25 ug via INTRAVENOUS
  Administered 2020-05-23: 50 ug via INTRAVENOUS
  Administered 2020-05-23 (×3): 25 ug via INTRAVENOUS

## 2020-05-23 MED ORDER — CHLORHEXIDINE GLUCONATE 0.12 % MT SOLN: 15.0000 mL | Freq: Once | OROMUCOSAL | Status: DC

## 2020-05-23 MED ORDER — CEFAZOLIN SODIUM-DEXTROSE 1-4 GM/50ML-% IV SOLN
INTRAVENOUS | Status: AC
  Filled 2020-05-23: qty 50

## 2020-05-23 MED ORDER — PROPOFOL 10 MG/ML IV BOLUS
INTRAVENOUS | Status: DC | PRN
Start: 2020-05-23 — End: 2020-05-23
  Administered 2020-05-23: 300 mg via INTRAVENOUS

## 2020-05-23 MED ORDER — CEFAZOLIN SODIUM-DEXTROSE 2-4 GM/100ML-% IV SOLN
INTRAVENOUS | Status: AC
  Filled 2020-05-23: qty 100

## 2020-05-23 MED ORDER — ONDANSETRON HCL 4 MG/2ML IJ SOLN
INTRAMUSCULAR | Status: DC | PRN
Start: 2020-05-23 — End: 2020-05-23
  Administered 2020-05-23: 4 mg via INTRAVENOUS

## 2020-05-23 MED ORDER — FENTANYL CITRATE (PF) 100 MCG/2ML IJ SOLN
25.0000 ug | INTRAMUSCULAR | Status: DC | PRN
  Administered 2020-05-23: 25 ug via INTRAVENOUS
  Administered 2020-05-23: 50 ug via INTRAVENOUS
  Administered 2020-05-23: 25 ug via INTRAVENOUS
  Filled 2020-05-23: qty 2

## 2020-05-23 MED ORDER — LACTATED RINGERS IV SOLN: INTRAVENOUS | Status: DC | PRN

## 2020-05-23 MED ORDER — SODIUM CHLORIDE 0.9 % IR SOLN
Status: DC | PRN
  Administered 2020-05-23 (×3): 3000 mL

## 2020-05-23 MED ORDER — ONDANSETRON HCL 4 MG/2ML IJ SOLN
4.0000 mg | Freq: Once | INTRAMUSCULAR | Status: AC | PRN
  Administered 2020-05-23: 4 mg via INTRAVENOUS

## 2020-05-23 MED ORDER — EPINEPHRINE PF 1 MG/ML IJ SOLN
INTRAMUSCULAR | Status: AC
  Filled 2020-05-23: qty 4

## 2020-05-23 SURGICAL SUPPLY — 52 items
ABLATOR ASPIRATE 50D MULTI-PRT (SURGICAL WAND) ×2 IMPLANT
APL PRP STRL LF DISP 70% ISPRP (MISCELLANEOUS) ×1
BAG HAMPER (MISCELLANEOUS) ×2 IMPLANT
BANDAGE ELASTIC 6 VELCRO NS (GAUZE/BANDAGES/DRESSINGS) ×2 IMPLANT
BLADE SHAVER TORPEDO 4X13 (MISCELLANEOUS) ×2 IMPLANT
BLADE SURG SZ11 CARB STEEL (BLADE) ×2 IMPLANT
BNDG CMPR STD VLCR NS LF 5.8X6 (GAUZE/BANDAGES/DRESSINGS) ×1
BNDG ELASTIC 6X5.8 VLCR NS LF (GAUZE/BANDAGES/DRESSINGS) ×2 IMPLANT
CHLORAPREP W/TINT 26 (MISCELLANEOUS) ×2 IMPLANT
CLOTH BEACON ORANGE TIMEOUT ST (SAFETY) ×2 IMPLANT
COOLER ICEMAN CLASSIC (MISCELLANEOUS) ×2 IMPLANT
COVER WAND RF STERILE (DRAPES) ×4 IMPLANT
CUFF TOURN SGL QUICK 34 (TOURNIQUET CUFF)
CUFF TRNQT CYL 34X4.125X (TOURNIQUET CUFF) IMPLANT
DECANTER SPIKE VIAL GLASS SM (MISCELLANEOUS) ×4 IMPLANT
DISSECTOR 4.0MM X 13CM (MISCELLANEOUS) ×2 IMPLANT
DRAPE HALF SHEET 40X57 (DRAPES) ×2 IMPLANT
GAUZE 4X4 16PLY RFD (DISPOSABLE) ×2 IMPLANT
GAUZE SPONGE 4X4 12PLY STRL (GAUZE/BANDAGES/DRESSINGS) ×2 IMPLANT
GAUZE SPONGE 4X4 16PLY XRAY LF (GAUZE/BANDAGES/DRESSINGS) ×2 IMPLANT
GAUZE XEROFORM 5X9 LF (GAUZE/BANDAGES/DRESSINGS) ×2 IMPLANT
GLOVE BIOGEL PI IND STRL 7.0 (GLOVE) ×2 IMPLANT
GLOVE BIOGEL PI INDICATOR 7.0 (GLOVE) ×2
GLOVE SKINSENSE NS SZ8.0 LF (GLOVE) ×1
GLOVE SKINSENSE STRL SZ8.0 LF (GLOVE) ×1 IMPLANT
GLOVE SS N UNI LF 8.5 STRL (GLOVE) ×2 IMPLANT
GOWN STRL REUS W/TWL LRG LVL3 (GOWN DISPOSABLE) ×2 IMPLANT
GOWN STRL REUS W/TWL XL LVL3 (GOWN DISPOSABLE) ×2 IMPLANT
HLDR LEG FOAM (MISCELLANEOUS) ×1 IMPLANT
IV NS IRRIG 3000ML ARTHROMATIC (IV SOLUTION) ×6 IMPLANT
KIT BLADEGUARD II DBL (SET/KITS/TRAYS/PACK) ×2 IMPLANT
KIT TURNOVER CYSTO (KITS) ×2 IMPLANT
LEG HOLDER FOAM (MISCELLANEOUS) ×2
MANIFOLD NEPTUNE II (INSTRUMENTS) ×2 IMPLANT
MARKER SKIN DUAL TIP RULER LAB (MISCELLANEOUS) ×2 IMPLANT
NEEDLE HYPO 18GX1.5 BLUNT FILL (NEEDLE) ×2 IMPLANT
NEEDLE HYPO 21X1.5 SAFETY (NEEDLE) ×2 IMPLANT
NEEDLE SPNL 18GX3.5 QUINCKE PK (NEEDLE) ×2 IMPLANT
NS IRRIG 1000ML POUR BTL (IV SOLUTION) ×2 IMPLANT
PACK ARTHRO LIMB DRAPE STRL (MISCELLANEOUS) ×2 IMPLANT
PAD ABD 5X9 TENDERSORB (GAUZE/BANDAGES/DRESSINGS) ×2 IMPLANT
PAD ARMBOARD 7.5X6 YLW CONV (MISCELLANEOUS) ×2 IMPLANT
PAD COLD SHLDR WRAP-ON (PAD) ×2 IMPLANT
PADDING CAST COTTON 6X4 STRL (CAST SUPPLIES) ×2 IMPLANT
PADDING WEBRIL 6 STERILE (GAUZE/BANDAGES/DRESSINGS) ×2 IMPLANT
SET ARTHROSCOPY INST (INSTRUMENTS) ×2 IMPLANT
SET BASIN LINEN APH (SET/KITS/TRAYS/PACK) ×2 IMPLANT
SUT ETHILON 3 0 FSL (SUTURE) ×2 IMPLANT
SYR 10ML LL (SYRINGE) ×2 IMPLANT
SYR 30ML LL (SYRINGE) ×2 IMPLANT
TUBE CONNECTING 12X1/4 (SUCTIONS) ×4 IMPLANT
TUBING IN/OUT FLOW W/MAIN PUMP (TUBING) ×2 IMPLANT

## 2020-05-23 NOTE — Interval H&P Note (Signed)
History and Physical Interval Note:  05/23/2020 8:46 AM  Kristine Montgomery  has presented today for surgery, with the diagnosis of right knee medial meniscus tear.  The various methods of treatment have been discussed with the patient and family. After consideration of risks, benefits and other options for treatment, the patient has consented to  Procedure(s): KNEE ARTHROSCOPY WITH MEDIAL MENISECTOMY (Right) as a surgical intervention.  The patient's history has been reviewed, patient examined, no change in status, stable for surgery.  I have reviewed the patient's chart and labs.  Questions were answered to the patient's satisfaction.     Fuller [place]

## 2020-05-23 NOTE — Discharge Instructions (Signed)
General Anesthesia, Adult, Care After This sheet gives you information about how to care for yourself after your procedure. Your health care provider may also give you more specific instructions. If you have problems or questions, contact your health care provider. What can I expect after the procedure? After the procedure, the following side effects are common:  Pain or discomfort at the IV site.  Nausea.  Vomiting.  Sore throat.  Trouble concentrating.  Feeling cold or chills.  Weak or tired.  Sleepiness and fatigue.  Soreness and body aches. These side effects can affect parts of the body that were not involved in surgery. Follow these instructions at home:  For at least 24 hours after the procedure:  Have a responsible adult stay with you. It is important to have someone help care for you until you are awake and alert.  Rest as needed.  Do not: ? Participate in activities in which you could fall or become injured. ? Drive. ? Use heavy machinery. ? Drink alcohol. ? Take sleeping pills or medicines that cause drowsiness. ? Make important decisions or sign legal documents. ? Take care of children on your own. Eating and drinking  Follow any instructions from your health care provider about eating or drinking restrictions.  When you feel hungry, start by eating small amounts of foods that are soft and easy to digest (bland), such as toast. Gradually return to your regular diet.  Drink enough fluid to keep your urine pale yellow.  If you vomit, rehydrate by drinking water, juice, or clear broth. General instructions  If you have sleep apnea, surgery and certain medicines can increase your risk for breathing problems. Follow instructions from your health care provider about wearing your sleep device: ? Anytime you are sleeping, including during daytime naps. ? While taking prescription pain medicines, sleeping medicines, or medicines that make you drowsy.  Return to  your normal activities as told by your health care provider. Ask your health care provider what activities are safe for you.  Take over-the-counter and prescription medicines only as told by your health care provider.  If you smoke, do not smoke without supervision.  Keep all follow-up visits as told by your health care provider. This is important. Contact a health care provider if:  You have nausea or vomiting that does not get better with medicine.  You cannot eat or drink without vomiting.  You have pain that does not get better with medicine.  You are unable to pass urine.  You develop a skin rash.  You have a fever.  You have redness around your IV site that gets worse. Get help right away if:  You have difficulty breathing.  You have chest pain.  You have blood in your urine or stool, or you vomit blood. Summary  After the procedure, it is common to have a sore throat or nausea. It is also common to feel tired.  Have a responsible adult stay with you for the first 24 hours after general anesthesia. It is important to have someone help care for you until you are awake and alert.  When you feel hungry, start by eating small amounts of foods that are soft and easy to digest (bland), such as toast. Gradually return to your regular diet.  Drink enough fluid to keep your urine pale yellow.  Return to your normal activities as told by your health care provider. Ask your health care provider what activities are safe for you. This information is not   intended to replace advice given to you by your health care provider. Make sure you discuss any questions you have with your health care provider. Document Revised: 05/06/2017 Document Reviewed: 12/17/2016 Elsevier Patient Education  2020 [employer]    Acetaminophen; Oxycodone tablets What is this medicine? ACETAMINOPHEN; OXYCODONE (a set a MEE noe fen; ox i KOE done) is a pain reliever. It is used to treat moderate to  severe pain. This medicine may be used for other purposes; ask your health care provider or pharmacist if you have questions. COMMON BRAND NAME(S): Endocet, Magnacet, Nalocet, Narvox, Percocet, Perloxx, Primalev, Primlev, Prolate, Roxicet, Xolox What should I tell my health care provider before I take this medicine? They need to know if you have any of these conditions:  brain tumor  Crohn's disease, inflammatory bowel disease, or ulcerative colitis  drug abuse or addiction  head injury  heart or circulation problems  if you often drink alcohol  kidney disease or problems going to the bathroom  liver disease  lung disease, asthma, or breathing problems  an unusual or allergic reaction to acetaminophen, oxycodone, other opioid analgesics, other medicines, foods, dyes, or preservatives  pregnant or trying to get pregnant  breast-feeding How should I use this medicine? Take this medicine by mouth with a full glass of water. Follow the directions on the prescription label. You can take it with or without food. If it upsets your stomach, take it with food. Take your medicine at regular intervals. Do not take it more often than directed. A special MedGuide will be given to you by the pharmacist with each prescription and refill. Be sure to read this information carefully each time. Talk to your pediatrician regarding the use of this medicine in children. Special care may be needed. Overdosage: If you think you have taken too much of this medicine contact a poison control center or emergency room at once. NOTE: This medicine is only for you. Do not share this medicine with others. What if I miss a dose? If you miss a dose, take it as soon as you can. If it is almost time for your next dose, take only that dose. Do not take double or extra doses. What may interact with this medicine? This medicine may interact with the following medications:  alcohol  antihistamines for allergy,  cough and cold  antiviral medicines for HIV or AIDS  atropine  certain antibiotics like clarithromycin, erythromycin, linezolid, rifampin  certain medicines for anxiety or sleep  certain medicines for bladder problems like oxybutynin, tolterodine  certain medicines for depression like amitriptyline, fluoxetine, sertraline  certain medicines for fungal infections like ketoconazole, itraconazole, voriconazole  certain medicines for migraine headache like almotriptan, eletriptan, frovatriptan, naratriptan, rizatriptan, sumatriptan, zolmitriptan  certain medicines for nausea or vomiting like dolasetron, ondansetron, palonosetron  certain medicines for Parkinson's disease like benztropine, trihexyphenidyl  certain medicines for seizures like phenobarbital, phenytoin, primidone  certain medicines for stomach problems like dicyclomine, hyoscyamine  certain medicines for travel sickness like scopolamine  diuretics  general anesthetics like halothane, isoflurane, methoxyflurane, propofol  ipratropium  local anesthetics like lidocaine, pramoxine, tetracaine  MAOIs like Carbex, Eldepryl, Marplan, Nardil, and Parnate  medicines that relax muscles for surgery  methylene blue  nilotinib  other medicines with acetaminophen  other narcotic medicines for pain or cough  phenothiazines like chlorpromazine, mesoridazine, prochlorperazine, thioridazine This list may not describe all possible interactions. Give your health care provider a list of all the medicines, herbs, non-prescription drugs, or dietary supplements  you use. Also tell them if you smoke, drink alcohol, or use illegal drugs. Some items may interact with your medicine. What should I watch for while using this medicine? Tell your health care provider if your pain does not go away, if it gets worse, or if you have new or a different type of pain. You may develop tolerance to this drug. Tolerance means that you will need a  higher dose of the drug for pain relief. Tolerance is normal and is expected if you take this drug for a long time. There are different types of narcotic drugs (opioids) for pain. If you take more than one type at the same time, you may have more side effects. Give your health care provider a list of all drugs you use. He or she will tell you how much drug to take. Do not take more drug than directed. Get emergency help right away if you have problems breathing. Do not suddenly stop taking your drug because you may develop a severe reaction. Your body becomes used to the drug. This does NOT mean you are addicted. Addiction is a behavior related to getting and using a drug for a nonmedical reason. If you have pain, you have a medical reason to take pain drug. Your health care provider will tell you how much drug to take. If your health care provider wants you to stop the drug, the dose will be slowly lowered over time to avoid any side effects. Talk to your health care provider about naloxone and how to get it. Naloxone is an emergency drug used for an opioid overdose. An overdose can happen if you take too much opioid. It can also happen if an opioid is taken with some other drugs or substances, like alcohol. Know the symptoms of an overdose, like trouble breathing, unusually tired or sleepy, or not being able to respond or wake up. Make sure to tell caregivers and close contacts where it is stored. Make sure they know how to use it. After naloxone is given, you must get emergency help right away. Naloxone is a temporary treatment. Repeat doses may be needed. Do not take other drugs that contain acetaminophen with this drug. Many non-prescription drugs contain acetaminophen. Always read labels carefully. If you have questions, ask your health care provider. If you take too much acetaminophen, get medical help right away. Too much acetaminophen can be very dangerous and cause liver damage. Even if you do not  have symptoms, it is important to get help right away. This drug does not prevent a heart attack or stroke. This drug may increase the chance of a heart attack or stroke. The chance may increase the longer you use this drug or if you have heart disease. If you take aspirin to prevent a heart attack or stroke, talk to your health care provider about using this drug. You may get drowsy or dizzy. Do not drive, use machinery, or do anything that needs mental alertness until you know how this drug affects you. Do not stand up or sit up quickly, especially if you are an older patient. This reduces the risk of dizzy or fainting spells. Alcohol may interfere with the effect of this drug. Avoid alcoholic drinks. This drug will cause constipation. If you do not have a bowel movement for 3 days, call your health care provider. Your mouth may get dry. Chewing sugarless gum or sucking hard candy and drinking plenty of water may help. Contact your health care provider  if the problem does not go away or is severe. What side effects may I notice from receiving this medicine? Side effects that you should report to your doctor or health care professional as soon as possible:  allergic reactions like skin rash, itching or hives, swelling of the face, lips, or tongue  breathing problems  confusion  redness, blistering, peeling or loosening of the skin, including inside the mouth  signs and symptoms of liver injury like dark yellow or brown urine; general ill feeling or flu-like symptoms; light-colored stools; loss of appetite; nausea; right upper belly pain; unusually weak or tired; yellowing of the eyes or skin  signs and symptoms of low blood pressure like dizziness; feeling faint or lightheaded, falls; unusually weak or tired  trouble passing urine or change in the amount of urine Side effects that usually do not require medical attention (report to your doctor or health care professional if they continue or are  bothersome):  constipation  dry mouth  nausea, vomiting  tiredness This list may not describe all possible side effects. Call your doctor for medical advice about side effects. You may report side effects to FDA at 1-800-FDA-1088. Where should I keep my medicine? Keep out of the reach of children. This medicine can be abused. Keep your medicine in a safe place to protect it from theft. Do not share this medicine with anyone. Selling or giving away this medicine is dangerous and against the law. Store at room temperature between 20 and 25 degrees C (68 and 77 degrees F). This medicine may cause harm and death if it is taken by other adults, children, or pets. Return medicine that has not been used to an official disposal site. Contact the DEA at (351) 092-1379 or your city/county government to find a site. If you cannot return the medicine, flush it down the toilet. Do not use the medicine after the expiration date. NOTE: This sheet is a summary. It may not cover all possible information. If you have questions about this medicine, talk to your doctor, pharmacist, or health care provider.  2020 Elsevier/Gold Standard (2018-12-12 12:25:25)    Promethazine tablets What is this medicine? PROMETHAZINE (proe METH a zeen) is an antihistamine. It is used to treat allergic reactions and to treat or prevent nausea and vomiting from illness or motion sickness. It is also used to make you sleep before surgery, and to help treat pain or nausea after surgery. This medicine may be used for other purposes; ask your health care provider or pharmacist if you have questions. COMMON BRAND NAME(S): Phenergan What should I tell my health care provider before I take this medicine? They need to know if you have any of these conditions:  blockage in your bowel  diabetes  glaucoma  have trouble controlling your muscles  heart disease  liver disease  low blood counts, like low white cell, platelet, or  red cell counts  lung or breathing disease, like asthma  Parkinson's disease  prostate disease  seizures  stomach or intestine problems  trouble passing urine  an unusual or allergic reaction to promethazine, sulfites, other medicines, foods, dyes, or preservatives  pregnant or trying to get pregnant  breast-feeding How should I use this medicine? Take this medicine by mouth with a glass of water. Follow the directions on the prescription label. Take your doses at regular intervals. Do not take your medicine more often than directed. Talk to your pediatrician regarding the use of this medicine in children. Special care  may be needed. This medicine should not be given to infants and children younger than 80 years old. Overdosage: If you think you have taken too much of this medicine contact a poison control center or emergency room at once. NOTE: This medicine is only for you. Do not share this medicine with others. What if I miss a dose? If you miss a dose, take it as soon as you can. If it is almost time for your next dose, take only that dose. Do not take double or extra doses. What may interact with this medicine?  alcohol  antihistamines for allergy, cough, and cold  atropine  certain medicines for anxiety or sleep  certain medicines for bladder problems like oxybutynin, tolterodine  certain medicines for depression like amitriptyline, fluoxetine, sertraline  certain medicines for Parkinson's disease like benztropine, trihexyphenidyl  certain medicines for stomach problems like dicyclomine, hyoscyamine  certain medicines for travel sickness like scopolamine  epinephrine  general anesthetics like halothane, isoflurane, methoxyflurane, propofol  ipratropium  MAOIs like Marplan, Nardil, and Parnate  medicines for high blood pressure  medicines for seizures like phenobarbital, primidone, phenytoin  medicines that relax muscles for  surgery  metoclopramide  narcotic medicines for pain This list may not describe all possible interactions. Give your health care provider a list of all the medicines, herbs, non-prescription drugs, or dietary supplements you use. Also tell them if you smoke, drink alcohol, or use illegal drugs. Some items may interact with your medicine. What should I watch for while using this medicine? Visit your health care professional for regular checks on your progress. Tell your health care professional if symptoms do not start to get better or if they get worse. You may get drowsy or dizzy. Do not drive, use machinery, or do anything that needs mental alertness until you know how this medicine affects you. To reduce the risk of dizzy or fainting spells, do not stand or sit up quickly, especially if you are an older patient. Alcohol may increase dizziness and drowsiness. Avoid alcoholic drinks. Your mouth may get dry. Chewing sugarless gum or sucking hard candy, and drinking plenty of water may help. Contact your doctor if the problem does not go away or is severe. This medicine may cause dry eyes and blurred vision. If you wear contact lenses you may feel some discomfort. Lubricating drops may help. See your eye doctor if the problem does not go away or is severe. This medicine can make you more sensitive to the sun. Keep out of the sun. If you cannot avoid being in the sun, wear protective clothing and use sunscreen. Do not use sun lamps or tanning beds/booths. This medicine may increase blood sugar. Ask your health care provider if changes in diet or medicines are needed if you have diabetes. What side effects may I notice from receiving this medicine? Side effects that you should report to your doctor or health care professional as soon as possible:  allergic reactions like skin rash, itching or hives, swelling of the face, lips, or tongue  breathing problems  changes in vision  confusion  fever,  chills, sore throat  pain, redness, or irritation at site where injected  seizures  signs and symptoms of high blood sugar such as being more thirsty or hungry or having to urinate more than normal. You may also feel very tired or have blurry vision.  signs and symptoms of liver injury like dark yellow or brown urine; general ill feeling or flu-like symptoms; light-colored  stools; loss of appetite; nausea; right upper belly pain; unusually weak or tired; yellowing of the eyes or skin  signs and symptoms of low blood pressure like dizziness; feeling faint or lightheaded, falls; unusually weak or tired  trouble passing urine or change in the amount of urine  trouble swallowing  uncontrollable movements of the arms, face, head, mouth, neck, or upper body  unusual bruising or bleeding  unusually weak or tired Side effects that usually do not require medical attention (report to your doctor or health care professional if they continue or are bothersome):  drowsiness  dry mouth  restlessness This list may not describe all possible side effects. Call your doctor for medical advice about side effects. You may report side effects to FDA at 1-800-FDA-1088. Where should I keep my medicine? Keep out of the reach of children. Store at room temperature, between 20 and 25 degrees C (68 and 77 degrees F). Protect from light. Throw away any unused medicine after the expiration date. NOTE: This sheet is a summary. It may not cover all possible information. If you have questions about this medicine, talk to your doctor, pharmacist, or health care provider.  2020 Elsevier/Gold Standard (2019-03-12 13:27:34)

## 2020-05-23 NOTE — Anesthesia Preprocedure Evaluation (Signed)
Anesthesia Evaluation  Patient identified by MRN, date of birth, ID band Patient awake    Reviewed: Allergy & Precautions, H&P , NPO status , Patient's Chart, lab work & pertinent test results, reviewed documented beta blocker date and time   Airway Mallampati: IV  TM Distance: >3 FB Neck ROM: full    Dental no notable dental hx.    Pulmonary neg pulmonary ROS, Current Smoker and Patient abstained from smoking.,    Pulmonary exam normal breath sounds clear to auscultation       Cardiovascular Exercise Tolerance: Good negative cardio ROS   Rhythm:regular Rate:Normal     Neuro/Psych negative neurological ROS  negative psych ROS   GI/Hepatic negative GI ROS, Neg liver ROS,   Endo/Other  negative endocrine ROS  Renal/GU negative Renal ROS  negative genitourinary   Musculoskeletal   Abdominal   Peds  Hematology negative hematology ROS (+)   Anesthesia Other Findings   Reproductive/Obstetrics negative OB ROS                             Anesthesia Physical Anesthesia Plan  ASA: II  Anesthesia Plan: General   Post-op Pain Management:    Induction:   PONV Risk Score and Plan: 3 and Ondansetron  Airway Management Planned:   Additional Equipment:   Intra-op Plan:   Post-operative Plan:   Informed Consent: I have reviewed the patients History and Physical, chart, labs and discussed the procedure including the risks, benefits and alternatives for the proposed anesthesia with the patient or authorized representative who has indicated his/her understanding and acceptance.     Dental Advisory Given  Plan Discussed with: CRNA  Anesthesia Plan Comments:         Anesthesia Quick Evaluation

## 2020-05-23 NOTE — Anesthesia Procedure Notes (Signed)
Procedure Name: LMA Insertion Date/Time: 05/23/2020 8:57 AM Performed by: Lorin Glass, CRNA Pre-anesthesia Checklist: Patient identified, Emergency Drugs available, Suction available and Patient being monitored Patient Re-evaluated:Patient Re-evaluated prior to induction Oxygen Delivery Method: Circle system utilized Preoxygenation: Pre-oxygenation with 100% oxygen LMA: LMA inserted LMA Size: 4.0 Number of attempts: 1 Tube secured with: Tape Dental Injury: Teeth and Oropharynx as per pre-operative assessment

## 2020-05-23 NOTE — Transfer of Care (Signed)
Immediate Anesthesia Transfer of Care Note  Patient: Kristine Montgomery  Procedure(s) Performed: KNEE ARTHROSCOPY WITH MEDIAL AND LATERAL MENISECTOMY; 3 COMPARTMENT SYNOVECTOMY (Right Knee)  Patient Location: PACU  Anesthesia Type:General  Level of Consciousness: drowsy  Airway & Oxygen Therapy: Patient Spontanous Breathing and Patient connected to nasal cannula oxygen  Post-op Assessment: Report given to RN and Post -op Vital signs reviewed and stable  Post vital signs: Reviewed and stable  Last Vitals:  Vitals Value Taken Time  BP 139/87 05/23/20 1000  Temp    Pulse 97 05/23/20 1000  Resp 19 05/23/20 1000  SpO2 90 % 05/23/20 1000  Vitals shown include unvalidated device data.  Last Pain:  Vitals:   05/23/20 0722  TempSrc: Oral  PainSc: 0-No pain         Complications: No complications documented.

## 2020-05-23 NOTE — Anesthesia Postprocedure Evaluation (Signed)
Anesthesia Post Note  Patient: Kristine Montgomery  Procedure(s) Performed: KNEE ARTHROSCOPY WITH MEDIAL AND LATERAL MENISECTOMY; 3 COMPARTMENT SYNOVECTOMY (Right Knee)  Patient location during evaluation: Phase II Anesthesia Type: General Level of consciousness: awake Pain management: pain level controlled Vital Signs Assessment: post-procedure vital signs reviewed and stable Respiratory status: spontaneous breathing Cardiovascular status: blood pressure returned to baseline Postop Assessment: no headache Anesthetic complications: no   No complications documented.   Last Vitals:  Vitals:   05/23/20 1047 05/23/20 1104  BP: 129/67 (!) 130/91  Pulse: 85 84  Resp: 17 18  Temp:  36.5 C  SpO2: 94% 96%    Last Pain:  Vitals:   05/23/20 1104  TempSrc: Oral  PainSc: 8                  Windell Norfolk

## 2020-05-23 NOTE — Brief Op Note (Signed)
05/23/2020  9:56 AM  PATIENT:  Kristine Montgomery  55 y.o. female  PRE-OPERATIVE DIAGNOSIS:  right knee medial meniscus tear  POST-OPERATIVE DIAGNOSIS:  right knee medial and lateral meniscus tear, arthritis, synovitis  PROCEDURE:  Procedure(s): KNEE ARTHROSCOPY WITH MEDIAL AND LATERAL MENISECTOMY; 3 COMPARTMENT SYNOVECTOMY (Right)  SURGEON:  Surgeon(s) and Role:    Vickki Hearing, MD - Primary  FINDINGS There is extensive debris in the joint with fibrinous exudate and extensive synovitis  Posterior horn the medial meniscus was torn at the root with a horizontal oblique inferior flap at the posterior horn The medial compartment had grade 3 degeneration of the cartilage  The ACL and PCL were intact  Posterior horn lateral meniscus was torn with grade 3 chondral changes  Patellofemoral joint had extensive debris grade 3 degenerative changes  All 3 compartments extensive synovitis    PHYSICIAN ASSISTANT:   ASSISTANTS: none   ANESTHESIA:   general  EBL:  NONE   BLOOD ADMINISTERED:none  DRAINS: none   LOCAL MEDICATIONS USED:  MARCAINE     SPECIMEN:  No Specimen  DISPOSITION OF SPECIMEN:  N/A  COUNTS:  YES  TOURNIQUET:  * Missing tourniquet times found for documented tourniquets in log: [id number] *  DICTATION: .Reubin Milan Dictation  PLAN OF CARE: Discharge to home after PACU  PATIENT DISPOSITION:  PACU - hemodynamically stable.   Delay start of Pharmacological VTE agent (>24hrs) due to surgical blood loss or risk of bleeding: no

## 2020-05-23 NOTE — Op Note (Signed)
05/23/2020  9:56 AM  PATIENT:  Kristine Montgomery  55 y.o. female  PRE-OPERATIVE DIAGNOSIS:  right knee medial meniscus tear  POST-OPERATIVE DIAGNOSIS:  right knee medial and lateral meniscus tear, arthritis, synovitis  PROCEDURE:  Procedure(s): KNEE ARTHROSCOPY WITH MEDIAL AND LATERAL MENISECTOMY; 3 COMPARTMENT SYNOVECTOMY (Right)  SURGEON:  Surgeon(s) and Role:    Vickki Hearing, MD - Primary  FINDINGS There is extensive debris in the joint with fibrinous exudate and extensive synovitis  Posterior horn the medial meniscus was torn at the root with a horizontal oblique inferior flap at the posterior horn The medial compartment had grade 3 degeneration of the cartilage  The ACL and PCL were intact  Posterior horn lateral meniscus was torn with grade 3 chondral changes  Patellofemoral joint had extensive debris grade 3 degenerative changes  All 3 compartments extensive synovitis  Details of procedure  The patient was seen in preop surgical site was confirmed and marked his right knee chart review was completed including images  Patient taken the operating for general anesthesia.  SHE was in supine position.  The left leg was placed in a padded leg holder and the right leg was placed in a padded arthroscopic leg holder  Right knee was examined under anesthesia was stable  Leg was then prepped and draped and timeout was completed  11 blade was used to create a lateral portal and the scope was placed into the joint.  There was a significant amount of debris in the joint and synovitis which is noted in all 3 compartments.  The knee was evaluated circumferentially and then a medial portal was established and a probe was placed into the joint and the diagnostic arthroscopy was repeated  Findings are listed above with the most notable findings being the extensive synovitis and arthritis in all 3 compartments the torn medial meniscus posterior horn at the root and the torn lateral  meniscus  First we did a medial synovectomy to aid in visualization and remove debris then  the medial meniscus was addressed.  We first released the MCL to allow better visualization of the medial posterior horn  There was an inferior flap oblique tear which was resected.  To balance the meniscus the superior leaf was partially resected.  A probe was then used to confirm stable rim.  We then did a synovectomy of the tissue anterior to the ACL  This was followed by a lateral synovectomy and then a lateral posterior horn partial meniscectomy.  Chondral flap on the lateral femoral condyle was also removed.  Patellofemoral joint was then addressed by removing the debris from the synovectomy.  The joint was irrigated suctioned dry injected with 60 cc of Marcaine with epinephrine.  Portals were closed with 3-0 nylon suture sterile dressing was applied along with a Cryo/Cuff  The patient was extubated taken to recovery in stable condition  PHYSICIAN ASSISTANT:   ASSISTANTS: none   ANESTHESIA:   general  EBL:  NONE   BLOOD ADMINISTERED:none  DRAINS: none   LOCAL MEDICATIONS USED:  MARCAINE     SPECIMEN:  No Specimen  DISPOSITION OF SPECIMEN:  N/A  COUNTS:  YES  TOURNIQUET:  * Missing tourniquet times found for documented tourniquets in log: [id number] *  DICTATION: .Kristine Montgomery Dictation  PLAN OF CARE: Discharge to home after PACU  PATIENT DISPOSITION:  PACU - hemodynamically stable.   Delay start of Pharmacological VTE agent (>24hrs) due to surgical blood loss or risk of bleeding: no

## 2020-05-26 ENCOUNTER — Encounter (HOSPITAL_COMMUNITY): Payer: Self-pay | Admitting: Orthopedic Surgery

## 2020-05-28 ENCOUNTER — Ambulatory Visit (INDEPENDENT_AMBULATORY_CARE_PROVIDER_SITE_OTHER): Payer: 59 | Admitting: Orthopedic Surgery

## 2020-05-28 ENCOUNTER — Encounter: Payer: Self-pay | Admitting: Orthopedic Surgery

## 2020-05-28 ENCOUNTER — Other Ambulatory Visit: Payer: Self-pay

## 2020-05-28 DIAGNOSIS — Z9889 Other specified postprocedural states: Secondary | ICD-10-CM

## 2020-05-28 MED ORDER — ETODOLAC 400 MG PO TABS: 400.0000 mg | ORAL_TABLET | Freq: Two times a day (BID) | ORAL | 4 refills | Status: DC

## 2020-05-28 NOTE — Progress Notes (Signed)
POST OP VISIT   Chief Complaint  Patient presents with  . Post-op Follow-up    Right knee arthroscopy 05/23/20     Encounter Diagnosis  Name Primary?  . S/P right knee arthroscopy 05/23/20 Yes     POV # 1  DOS 05/23/20  OP NOTE  PRE-OPERATIVE DIAGNOSIS:  right knee medial meniscus tear  POST-OPERATIVE DIAGNOSIS:  right knee medial and lateral meniscus tear, arthritis, synovitis  PROCEDURE:  Procedure(s): KNEE ARTHROSCOPY WITH MEDIAL AND LATERAL MENISECTOMY; 3 COMPARTMENT SYNOVECTOMY (Right)  FINDINGS There is extensive debris in the joint with fibrinous exudate and extensive synovitis  Posterior horn the medial meniscus was torn at the root with a horizontal oblique inferior flap at the posterior horn The medial compartment had grade 3 degeneration of the cartilage  The ACL and PCL were intact  Posterior horn lateral meniscus was torn with grade 3 chondral changes  Patellofemoral joint had extensive debris grade 3 degenerative changes  All 3 compartments extensive synovitis      DATA: Patient doing well using a walker but ready to get rid of it.  Portals are clean sutures were removed  Patient can start physical therapy at home with home exercises follow-up in 4 weeks  Encounter Diagnosis  Name Primary?  . S/P right knee arthroscopy 05/23/20 Yes

## 2020-05-28 NOTE — Patient Instructions (Signed)
Ice as needed   Exercises daily

## 2020-06-26 ENCOUNTER — Encounter: Payer: Self-pay | Admitting: Orthopedic Surgery

## 2020-06-26 ENCOUNTER — Other Ambulatory Visit: Payer: Self-pay

## 2020-06-26 ENCOUNTER — Ambulatory Visit (INDEPENDENT_AMBULATORY_CARE_PROVIDER_SITE_OTHER): Payer: 59 | Admitting: Orthopedic Surgery

## 2020-06-26 VITALS — Ht 61.0 in | Wt 263.0 lb

## 2020-06-26 DIAGNOSIS — Z9889 Other specified postprocedural states: Secondary | ICD-10-CM

## 2020-06-26 NOTE — Progress Notes (Signed)
Chief Complaint  Patient presents with  . Routine Post Op    Rt knee DOS 05/23/20   PROCEDURE:  Procedure(s): KNEE ARTHROSCOPY WITH MEDIAL AND LATERAL MENISECTOMY; 3 COMPARTMENT SYNOVECTOMY (Right)  FINDINGS There is extensive debris in the joint with fibrinous exudate and extensive synovitis  Posterior horn the medial meniscus was torn at the root with a horizontal oblique inferior flap at the posterior horn The medial compartment had grade 3 degeneration of the cartilage  The ACL and PCL were intact  Posterior horn lateral meniscus was torn with grade 3 chondral changes  Patellofemoral joint had extensive debris grade 3 degenerative changes  All 3 compartments extensive synovitis  Kristine Montgomery is improving slowly.  She has a range of motion deficit in extension from from the surgery some from the arthritis  I asked her to work on her extension continue to use her cane follow-up with me in a month

## 2020-06-26 NOTE — Patient Instructions (Signed)
Work on Psychologist, forensic

## 2020-07-24 ENCOUNTER — Ambulatory Visit: Payer: 59 | Admitting: Orthopedic Surgery

## 2020-07-28 ENCOUNTER — Emergency Department (HOSPITAL_COMMUNITY): Payer: 59

## 2020-07-28 ENCOUNTER — Inpatient Hospital Stay (HOSPITAL_COMMUNITY)
Admission: EM | Admit: 2020-07-28 | Discharge: 2020-07-31 | DRG: 246 | Disposition: A | Discharge status: Home / Self Care | Payer: 59 | Attending: Cardiology | Admitting: Cardiology

## 2020-07-28 ENCOUNTER — Emergency Department (HOSPITAL_BASED_OUTPATIENT_CLINIC_OR_DEPARTMENT_OTHER): Payer: 59

## 2020-07-28 ENCOUNTER — Encounter (HOSPITAL_COMMUNITY): Payer: Self-pay

## 2020-07-28 ENCOUNTER — Other Ambulatory Visit: Payer: Self-pay

## 2020-07-28 DIAGNOSIS — Z955 Presence of coronary angioplasty implant and graft: Secondary | ICD-10-CM

## 2020-07-28 DIAGNOSIS — Z20822 Contact with and (suspected) exposure to covid-19: Secondary | ICD-10-CM | POA: Diagnosis not present

## 2020-07-28 DIAGNOSIS — G4736 Sleep related hypoventilation in conditions classified elsewhere: Secondary | ICD-10-CM | POA: Clinically undetermined

## 2020-07-28 DIAGNOSIS — Z716 Tobacco abuse counseling: Secondary | ICD-10-CM | POA: Diagnosis not present

## 2020-07-28 DIAGNOSIS — R7989 Other specified abnormal findings of blood chemistry: Secondary | ICD-10-CM | POA: Diagnosis not present

## 2020-07-28 DIAGNOSIS — Z825 Family history of asthma and other chronic lower respiratory diseases: Secondary | ICD-10-CM | POA: Diagnosis not present

## 2020-07-28 DIAGNOSIS — E785 Hyperlipidemia, unspecified: Secondary | ICD-10-CM | POA: Diagnosis not present

## 2020-07-28 DIAGNOSIS — Z807 Family history of other malignant neoplasms of lymphoid, hematopoietic and related tissues: Secondary | ICD-10-CM

## 2020-07-28 DIAGNOSIS — F17210 Nicotine dependence, cigarettes, uncomplicated: Secondary | ICD-10-CM | POA: Diagnosis not present

## 2020-07-28 DIAGNOSIS — I255 Ischemic cardiomyopathy: Secondary | ICD-10-CM | POA: Diagnosis not present

## 2020-07-28 DIAGNOSIS — Z8249 Family history of ischemic heart disease and other diseases of the circulatory system: Secondary | ICD-10-CM | POA: Diagnosis not present

## 2020-07-28 DIAGNOSIS — I214 Non-ST elevation (NSTEMI) myocardial infarction: Principal | ICD-10-CM | POA: Diagnosis present

## 2020-07-28 DIAGNOSIS — I200 Unstable angina: Secondary | ICD-10-CM | POA: Diagnosis present

## 2020-07-28 DIAGNOSIS — I5041 Acute combined systolic (congestive) and diastolic (congestive) heart failure: Secondary | ICD-10-CM | POA: Diagnosis present

## 2020-07-28 DIAGNOSIS — R079 Chest pain, unspecified: Secondary | ICD-10-CM

## 2020-07-28 DIAGNOSIS — Z833 Family history of diabetes mellitus: Secondary | ICD-10-CM | POA: Diagnosis not present

## 2020-07-28 DIAGNOSIS — E876 Hypokalemia: Secondary | ICD-10-CM | POA: Diagnosis present

## 2020-07-28 DIAGNOSIS — Z6841 Body Mass Index (BMI) 40.0 and over, adult: Secondary | ICD-10-CM | POA: Diagnosis not present

## 2020-07-28 DIAGNOSIS — Z720 Tobacco use: Secondary | ICD-10-CM | POA: Diagnosis not present

## 2020-07-28 DIAGNOSIS — R0902 Hypoxemia: Secondary | ICD-10-CM | POA: Diagnosis present

## 2020-07-28 DIAGNOSIS — G4734 Idiopathic sleep related nonobstructive alveolar hypoventilation: Secondary | ICD-10-CM

## 2020-07-28 DIAGNOSIS — E669 Obesity, unspecified: Secondary | ICD-10-CM | POA: Clinically undetermined

## 2020-07-28 DIAGNOSIS — R778 Other specified abnormalities of plasma proteins: Secondary | ICD-10-CM

## 2020-07-28 DIAGNOSIS — E782 Mixed hyperlipidemia: Secondary | ICD-10-CM | POA: Diagnosis present

## 2020-07-28 DIAGNOSIS — I110 Hypertensive heart disease with heart failure: Secondary | ICD-10-CM | POA: Diagnosis not present

## 2020-07-28 DIAGNOSIS — R0683 Snoring: Secondary | ICD-10-CM

## 2020-07-28 DIAGNOSIS — I25110 Atherosclerotic heart disease of native coronary artery with unstable angina pectoris: Secondary | ICD-10-CM | POA: Diagnosis present

## 2020-07-28 HISTORY — DX: Unspecified osteoarthritis, unspecified site: M19.90

## 2020-07-28 HISTORY — DX: Chronic obstructive pulmonary disease, unspecified: J44.9

## 2020-07-28 HISTORY — DX: Pneumonia, unspecified organism: J18.9

## 2020-07-28 HISTORY — DX: Personal history of other diseases of the digestive system: Z87.19

## 2020-07-28 LAB — CBC WITH DIFFERENTIAL/PLATELET
Abs Immature Granulocytes: 0.02 10*3/uL (ref 0.00–0.07)
Basophils Absolute: 0 10*3/uL (ref 0.0–0.1)
Basophils Relative: 1 %
Eosinophils Absolute: 0.2 10*3/uL (ref 0.0–0.5)
Eosinophils Relative: 2 %
HCT: 41.7 % (ref 36.0–46.0)
Hemoglobin: 13.1 g/dL (ref 12.0–15.0)
Immature Granulocytes: 0 %
Lymphocytes Relative: 20 %
Lymphs Abs: 1.7 10*3/uL (ref 0.7–4.0)
MCH: 28.2 pg (ref 26.0–34.0)
MCHC: 31.4 g/dL (ref 30.0–36.0)
MCV: 89.9 fL (ref 80.0–100.0)
Monocytes Absolute: 0.6 10*3/uL (ref 0.1–1.0)
Monocytes Relative: 7 %
Neutro Abs: 6 10*3/uL (ref 1.7–7.7)
Neutrophils Relative %: 70 %
Platelets: 305 10*3/uL (ref 150–400)
RBC: 4.64 MIL/uL (ref 3.87–5.11)
RDW: 15.1 % (ref 11.5–15.5)
WBC: 8.5 10*3/uL (ref 4.0–10.5)
nRBC: 0 % (ref 0.0–0.2)

## 2020-07-28 LAB — LIPASE, BLOOD: Lipase: 37 U/L (ref 11–51)

## 2020-07-28 LAB — COMPREHENSIVE METABOLIC PANEL
ALT: 15 U/L (ref 0–44)
AST: 18 U/L (ref 15–41)
Albumin: 3.2 g/dL — ABNORMAL LOW (ref 3.5–5.0)
Alkaline Phosphatase: 96 U/L (ref 38–126)
Anion gap: 11 (ref 5–15)
BUN: 14 mg/dL (ref 6–20)
CO2: 23 mmol/L (ref 22–32)
Calcium: 8.9 mg/dL (ref 8.9–10.3)
Chloride: 103 mmol/L (ref 98–111)
Creatinine, Ser: 0.53 mg/dL (ref 0.44–1.00)
GFR, Estimated: >60 mL/min (ref >60)
Glucose, Bld: 127 mg/dL — ABNORMAL HIGH (ref 70–99)
Potassium: 4 mmol/L (ref 3.5–5.1)
Sodium: 137 mmol/L (ref 135–145)
Total Bilirubin: 0.7 mg/dL (ref 0.3–1.2)
Total Protein: 8.2 g/dL — ABNORMAL HIGH (ref 6.5–8.1)

## 2020-07-28 LAB — SARS CORONAVIRUS 2 (TAT 6-24 HRS): SARS Coronavirus 2: NEGATIVE

## 2020-07-28 LAB — ECHOCARDIOGRAM COMPLETE
Area-P 1/2: 4.33 cm2
Height: 61 in
S' Lateral: 4.5 cm
Weight: 4480 oz

## 2020-07-28 LAB — HEPARIN LEVEL (UNFRACTIONATED): Heparin Unfractionated: <0.1 IU/mL — ABNORMAL LOW (ref 0.30–0.70)

## 2020-07-28 LAB — TROPONIN I (HIGH SENSITIVITY)
Troponin I (High Sensitivity): 211 ng/L (ref <18)
Troponin I (High Sensitivity): 273 ng/L (ref <18)
Troponin I (High Sensitivity): 390 ng/L (ref <18)

## 2020-07-28 LAB — BRAIN NATRIURETIC PEPTIDE: B Natriuretic Peptide: 200 pg/mL — ABNORMAL HIGH (ref 0.0–100.0)

## 2020-07-28 LAB — HIV ANTIBODY (ROUTINE TESTING W REFLEX): HIV Screen 4th Generation wRfx: NONREACTIVE

## 2020-07-28 MED ORDER — ACETAMINOPHEN 325 MG PO TABS: 650.0000 mg | ORAL_TABLET | ORAL | Status: DC | PRN

## 2020-07-28 MED ORDER — SODIUM CHLORIDE 0.9 % IV SOLN: INTRAVENOUS | Status: DC

## 2020-07-28 MED ORDER — ASPIRIN 81 MG PO CHEW: 324.0000 mg | CHEWABLE_TABLET | ORAL | Status: DC

## 2020-07-28 MED ORDER — ASPIRIN 300 MG RE SUPP: 300.0000 mg | RECTAL | Status: DC

## 2020-07-28 MED ORDER — ONDANSETRON HCL 4 MG/2ML IJ SOLN: 4.0000 mg | Freq: Four times a day (QID) | INTRAMUSCULAR | Status: DC | PRN

## 2020-07-28 MED ORDER — SODIUM CHLORIDE 0.9 % IV SOLN: 250.0000 mL | INTRAVENOUS | Status: DC | PRN

## 2020-07-28 MED ORDER — HEPARIN BOLUS VIA INFUSION
4000.0000 [IU] | Freq: Once | INTRAVENOUS | Status: AC
  Administered 2020-07-28: 4000 [IU] via INTRAVENOUS

## 2020-07-28 MED ORDER — ASPIRIN 325 MG PO TABS
325.0000 mg | ORAL_TABLET | Freq: Once | ORAL | Status: AC
  Administered 2020-07-28: 325 mg via ORAL
  Filled 2020-07-28: qty 1

## 2020-07-28 MED ORDER — SODIUM CHLORIDE 0.9% FLUSH: 3.0000 mL | Freq: Two times a day (BID) | INTRAVENOUS | Status: DC

## 2020-07-28 MED ORDER — SODIUM CHLORIDE 0.9% FLUSH: 3.0000 mL | INTRAVENOUS | Status: DC | PRN

## 2020-07-28 MED ORDER — HEPARIN (PORCINE) 25000 UT/250ML-% IV SOLN
1600.0000 [IU]/h | INTRAVENOUS | Status: DC
  Administered 2020-07-28: 1100 [IU]/h via INTRAVENOUS
  Administered 2020-07-29: 1600 [IU]/h via INTRAVENOUS
  Filled 2020-07-28 (×2): qty 250

## 2020-07-28 MED ORDER — VIBEGRON 75 MG PO TABS: 75.0000 mg | ORAL_TABLET | Freq: Every day | ORAL | Status: DC

## 2020-07-28 MED ORDER — ASPIRIN 81 MG PO CHEW
81.0000 mg | CHEWABLE_TABLET | ORAL | Status: AC
  Administered 2020-07-29: 81 mg via ORAL
  Filled 2020-07-28: qty 1

## 2020-07-28 MED ORDER — ASPIRIN EC 81 MG PO TBEC
81.0000 mg | DELAYED_RELEASE_TABLET | Freq: Every day | ORAL | Status: DC
  Filled 2020-07-28: qty 1

## 2020-07-28 MED ORDER — METOPROLOL SUCCINATE ER 25 MG PO TB24
25.0000 mg | ORAL_TABLET | Freq: Every day | ORAL | Status: DC
  Administered 2020-07-28 – 2020-07-31 (×4): 25 mg via ORAL
  Filled 2020-07-28 (×4): qty 1

## 2020-07-28 MED ORDER — NITROGLYCERIN 0.4 MG SL SUBL: 0.4000 mg | SUBLINGUAL_TABLET | SUBLINGUAL | Status: DC | PRN

## 2020-07-28 MED ORDER — ETODOLAC 400 MG PO TABS
400.0000 mg | ORAL_TABLET | Freq: Two times a day (BID) | ORAL | Status: DC
  Administered 2020-07-28: 400 mg via ORAL
  Filled 2020-07-28 (×3): qty 1

## 2020-07-28 MED ORDER — HYDROCODONE-ACETAMINOPHEN 10-325 MG PO TABS
1.0000 | ORAL_TABLET | ORAL | Status: DC | PRN
Start: 2020-07-28 — End: 2020-07-31
  Administered 2020-07-28 – 2020-07-30 (×5): 1 via ORAL
  Filled 2020-07-28 (×5): qty 1

## 2020-07-28 MED ORDER — SODIUM CHLORIDE 0.9% FLUSH
3.0000 mL | Freq: Two times a day (BID) | INTRAVENOUS | Status: DC
  Administered 2020-07-28 – 2020-07-29 (×2): 3 mL via INTRAVENOUS

## 2020-07-28 NOTE — Progress Notes (Signed)
ANTICOAGULATION CONSULT NOTE - Initial Consult  Pharmacy Consult for heparin Indication: chest pain/ACS  No Known Allergies  Patient Measurements: Height: 5\' 1"  (154.9 cm) Weight: 127 kg (280 lb) IBW/kg (Calculated) : 47.8 Heparin Dosing Weight: 80 kg  Vital Signs: Temp: 97.6 F (36.4 C) (03/14 1039) Temp Source: Oral (03/14 1039) BP: 134/83 (03/14 1415) Pulse Rate: 74 (03/14 1415)  Labs: Recent Labs    07/28/20 1100 07/28/20 1248  HGB 13.1  --   HCT 41.7  --   PLT 305  --   CREATININE 0.53  --   TROPONINIHS 211* 273*    Estimated Creatinine Clearance: 99.7 mL/min (by C-G formula based on SCr of 0.53 mg/dL).   Medical History: Past Medical History:  Diagnosis Date  . Asthma   . Chronic bronchitis (HCC)   . Edema extremities   . Ovarian cyst     Medications:  (Not in a hospital admission)   Assessment: Pharmacy consulted to dose heparin in patient with chest pain/ACS.  Trop 273  Goal of Therapy:  Heparin level 0.3-0.7 units/ml Monitor platelets by anticoagulation protocol: Yes   Plan:  Give 4000 units bolus x 1 Start heparin infusion at 1100 units/hr Check anti-Xa level in 6 hours and daily while on heparin Continue to monitor H&H and platelets  07/30/20, PharmD Clinical Pharmacist 07/28/2020 2:50 PM

## 2020-07-28 NOTE — Progress Notes (Signed)
ANTICOAGULATION CONSULT NOTE - Follow Up Consult  Pharmacy Consult for heparin Indication: chest pain/ACS  No Known Allergies  Patient Measurements: Height: 5\' 1"  (154.9 cm) Weight: 124.6 kg (274 lb 12.8 oz) IBW/kg (Calculated) : 47.8 Heparin Dosing Weight: 80 kg  Vital Signs: Temp: 97.9 F (36.6 C) (03/14 1958) Temp Source: Oral (03/14 1958) BP: 146/92 (03/14 1958) Pulse Rate: 78 (03/14 1958)  Labs: Recent Labs    07/28/20 1100 07/28/20 1248 07/28/20 1502 07/28/20 2050  HGB 13.1  --   --   --   HCT 41.7  --   --   --   PLT 305  --   --   --   HEPARINUNFRC  --   --   --  <0.10*  CREATININE 0.53  --   --   --   TROPONINIHS 211* 273* 390*  --     Estimated Creatinine Clearance: 98.5 mL/min (by C-G formula based on SCr of 0.53 mg/dL).   Medical History: Past Medical History:  Diagnosis Date  . Arthritis   . Asthma   . Chronic bronchitis (HCC)   . COPD (chronic obstructive pulmonary disease) (HCC)   . Edema extremities   . History of hiatal hernia   . Ovarian cyst   . Pneumonia     Medications:  Medications Prior to Admission  Medication Sig Dispense Refill Last Dose  . etodolac (LODINE) 400 MG tablet Take 1 tablet (400 mg total) by mouth 2 (two) times daily. 60 tablet 4 07/27/2020 at Unknown time  . HYDROcodone-acetaminophen (NORCO) 10-325 MG tablet Take 1-2 tablets by mouth 4 (four) times daily as needed for moderate pain.   07/27/2020  . promethazine (PHENERGAN) 12.5 MG tablet Take 1 tablet (12.5 mg total) by mouth every 6 (six) hours as needed for nausea or vomiting. (Patient not taking: No sig reported) 30 tablet 0 Completed Course at Unknown time    Assessment: Pharmacy consulted to dose heparin in patient with chest pain/ACS. Trop peak 390 Heparin drip 1100 uts/hr HL undetectable  No bleeding noted  Goal of Therapy:  Heparin level 0.3-0.7 units/ml Monitor platelets by anticoagulation protocol: Yes   Plan:  Increase heparin drip 1400 uts/hr   Daily heparin level; and CBC  07/29/2020 Pharm.D. CPP, BCPS Clinical Pharmacist [phone] 07/28/2020 10:38 PM

## 2020-07-28 NOTE — H&P (Signed)
Cardiology Consultation:   Patient ID: Kristine Montgomery MRN: [id number]; DOB: [birth year]  Admit date: 07/28/2020 Date of Consult: 07/28/2020  PCP:  Elfredia Nevins, MD   [facility]  Cardiologist:  New  Patient Profile:   Kristine Montgomery is a 55 y.o. female with a hx of tobacco abuse  who is being seen today for the evaluation of CP  at the request of Dr Deretha Emory    History of Present Illness:   Kristine Montgomery is a 55 yo with no known CAD.   Presents to ED today with a Hx of CP   Lat week pt says she had SOB with activity Attrib to weather  Didn't  Last long    Went away.    Saturday feeling oK   Went to bed   Started around 8:30 PM   WOke her up   Pressure in chest   Not pleuritic    Sat up   Drank a bottle of water   Came back to bedroom   Same thing   Started hurting again   Sat up on side   Laid back down with legs dangling   Slept that way Yesterday (Sunday) all day long felt oK    NO SOB   Went to a birthday party     Walking with cane got a little SOB with walking a hill   No SOB or CP when got back home   Went to bed at 9:30   About 45 min later woke up again with CP (midchest )  No SOB    Sit up  Again   Went away  Laid down perpendicular with feet on floor   Slept like that with legs dangling to 4:30   Woke up   Laid back down again after BR with legs dangling   Woke up at 8:30    Got up   Had no further pain   Got here about 10:30   No pain today     Mother:   Had heart attack at 88.   CT scan of abdomen in 2021 showed atherosclerosis.  Past Medical History:  Diagnosis Date  . Asthma   . Chronic bronchitis (HCC)   . Edema extremities   . Ovarian cyst     Past Surgical History:  Procedure Laterality Date  . CESAREAN SECTION    . KNEE ARTHROSCOPY WITH MEDIAL MENISECTOMY Right 05/23/2020   Procedure: KNEE ARTHROSCOPY WITH MEDIAL AND LATERAL MENISECTOMY; 3 COMPARTMENT SYNOVECTOMY;  Surgeon: Vickki Hearing, MD;  Location: [facility];  Service:  Orthopedics;  Laterality: Right;  . TONSILLECTOMY    . TUBAL LIGATION         Inpatient Medications: Scheduled Meds:  Continuous Infusions: . heparin 1,100 Units/hr (07/28/20 1500)   PRN Meds:   Allergies:   No Known Allergies  Social History:   Social History   Socioeconomic History  . Marital status: Significant Other    Spouse name: Not on file  . Number of children: 2  . Years of education: Not on file  . Highest education level: Not on file  Occupational History  . Not on file  Tobacco Use  . Smoking status: Current Every Day Smoker    Packs/day: 0.50    Years: 30.00    Pack years: 15.00    Types: Cigarettes  . Smokeless tobacco: Never Used  Vaping Use  . Vaping Use: Never used  Substance and Sexual Activity  .  Alcohol use: Yes    Comment: occasional  . Drug use: No  . Sexual activity: Yes    Birth control/protection: Post-menopausal, Surgical    Comment: tubal  Other Topics Concern  . Not on file  Social History Narrative  . Not on file   Social Determinants of Health   Financial Resource Strain: Low Risk   . Difficulty of Paying Living Expenses: Not hard at all  Food Insecurity: No Food Insecurity  . Worried About Programme researcher, broadcasting/film/video in the Last Year: Never true  . Ran Out of Food in the Last Year: Never true  Transportation Needs: No Transportation Needs  . Lack of Transportation (Medical): No  . Lack of Transportation (Non-Medical): No  Physical Activity: Insufficiently Active  . Days of Exercise per Week: 1 day  . Minutes of Exercise per Session: 10 min  Stress: No Stress Concern Present  . Feeling of Stress : Not at all  Social Connections: Socially Isolated  . Frequency of Communication with Friends and Family: More than three times a week  . Frequency of Social Gatherings with Friends and Family: More than three times a week  . Attends Religious Services: Never  . Active Member of Clubs or Organizations: No  . Attends Tax inspector Meetings: Never  . Marital Status: Widowed  Intimate Partner Violence: Not At Risk  . Fear of Current or Ex-Partner: No  . Emotionally Abused: No  . Physically Abused: No  . Sexually Abused: No    Family History:    Family History  Problem Relation Age of Onset  . Cancer Paternal Grandfather        colon  . Cancer Maternal Grandmother        lung  . COPD Father   . Non-Hodgkin's lymphoma Mother   . Diabetes Mother   . Cancer Mother        cervical  . Congestive Heart Failure Mother   . Hypertension Sister   . Other Son        fluid around heart     ROS:  Please see the history of present illness.  All other ROS reviewed and negative.     Physical Exam/Data:   Vitals:   07/28/20 1415 07/28/20 1430 07/28/20 1445 07/28/20 1500  BP: 134/83 (!) 140/55  (!) 142/54  Pulse: 74 73 63 71  Resp: (!) 22 18 (!) 27 (!) 21  Temp:      TempSrc:      SpO2: 93% 92% 93% 93%  Weight:      Height:       No intake or output data in the 24 hours ending 07/28/20 1525 Last 3 Weights 07/28/2020 06/26/2020 05/21/2020  Weight (lbs) 280 lb 263 lb 263 lb  Weight (kg) 127.007 kg 119.296 kg 119.296 kg     Body mass index is 52.91 kg/m.  General:  Obese 55 yo in no acute distress HEENT: normal Lymph: no adenopathy Neck: no JVD Endocrine:  No thryomegaly Vascular: No carotid bruits; FA pulses 2+ bilaterally without bruits  Cardiac:  normal S1, S2; RRR; no murmur  Lungs:  clear to auscultation bilaterally, no wheezing, rhonchi or rales  Abd: soft, nontender, no hepatomegaly  Ext: no edema Musculoskeletal:  No deformities, BUE and BLE strength normal and equal Skin: warm and dry  Neuro:  CNs 2-12 intact, no focal abnormalities noted Psych:  Normal affect   EKG:  The EKG was personally reviewed and demonstrates:  SR 87  bpm  Vert Sl ST depression inferiorly and laterally   New from Jan 2022 Telemetry:  Telemetry was personally reviewed and demonstrates:    SR   Relevant CV  Studies: None    Laboratory Data:  High Sensitivity Troponin:   Recent Labs  Lab 07/28/20 1100 07/28/20 1248  TROPONINIHS 211* 273*     Chemistry Recent Labs  Lab 07/28/20 1100  NA 137  K 4.0  CL 103  CO2 23  GLUCOSE 127*  BUN 14  CREATININE 0.53  CALCIUM 8.9  GFRNONAA >60  ANIONGAP 11    Recent Labs  Lab 07/28/20 1100  PROT 8.2*  ALBUMIN 3.2*  AST 18  ALT 15  ALKPHOS 96  BILITOT 0.7   Hematology Recent Labs  Lab 07/28/20 1100  WBC 8.5  RBC 4.64  HGB 13.1  HCT 41.7  MCV 89.9  MCH 28.2  MCHC 31.4  RDW 15.1  PLT 305   BNP Recent Labs  Lab 07/28/20 1211  BNP 200.0*    DDimer No results for input(s): DDIMER in the last 168 hours.   Radiology/Studies:  DG Chest 2 View  Result Date: 07/28/2020 CLINICAL DATA:  Mid chest pain. EXAM: CHEST - 2 VIEW COMPARISON:  02/20/2018. FINDINGS: Trachea is midline. Heart is enlarged. Mild interstitial prominence and indistinctness, mid and lower lung zone predominant. No pleural fluid. Flowing anterior osteophytosis in the thoracic spine. IMPRESSION: Pulmonary edema. Electronically Signed   By: Leanna Battles M.D.   On: 07/28/2020 11:52     Assessment and Plan:   1. Chest pain   Pt is a 55 yo with hx of tobacco use and atherosclerosis of aorta   Now with a 2 day hx of chest pain   Pain only when laying completely horizontal in bed.  Pt was able to lay flat with feet dangling though and sleep  Supports PND type pain and not pericarditis type or GI  Pain.      Pt's activity is limited with cane;  Last week did have some SOB  Today's evaluation is  signif for troponin of 211 and 273  I have reviewed with patient    I am concerned about unstable angina  Given Hx, FHx (mom), tobacco use, findings (CT, troponin) I would recom L heart cath to define anatomy    Risks / benefits explained  Pt understands and agrees to proceed.  She says her son (63 yo) had one   Was normal    Pt currently on heparin.  Continue ASA   Start  lipitor  2   PAD   Pt with mod atherosclerosis of aorta on CT   Will get lipids   Start on Lipitor 80 mg   3   Tobacco abuse  Counselled on cessation.  Plan to transfer to [facility]   Plan procedure for tomorrow.      For questions or updates, please contact [facility] Please consult [email or web address] for contact info under    Signed, Dietrich Pates, MD  07/28/2020 3:25 PM

## 2020-07-28 NOTE — ED Provider Notes (Signed)
[facility] EMERGENCY DEPARTMENT Provider Note   CSN: [id number] Arrival date & time: 07/28/20  1029     History Chief Complaint  Patient presents with  . Chest Pain    Kristine Montgomery is a 55 y.o. female with past medical history significant for asthma, current everyday tobacco use, and chronic bronchitis presents the ED with 3-day history of chest pain.  Denies hx of HTN, HLD, T2DM, or prior hx of ACS.   On my examination, patient reports that she is experiencing epigastric burning and substernal "squeezing" chest discomfort that has been relatively constant for the past few days.  She states that it is worse when she lays down flat at night, 10 out of 10 while lying down.  However, currently on exam, she states that it is a very mild ache 1 out of 10.  Patient denies any history of reflux disease.  She continues to endorse regular tobacco use.  She has had a mild cough, but no different from her chronic smoker's cough.  Her chest discomfort is squeezing and substernal with associated nausea when it comes on.  No radiation of her symptoms.  Denies any abdominal pain, vomiting, diaphoresis, fevers or chills, or any other symptoms.  She googled her symptoms and was concerned about a heart attack.  She states that she recently had left knee surgery in January 2022.  However, she denies any recent unilateral extremity swelling or edema, history of clots or clotting disorder, hemoptysis, shortness of breath, or pleuritic symptoms.  HPI   Past Medical History:  Diagnosis Date  . Asthma   . Chronic bronchitis (HCC)   . Edema extremities   . Ovarian cyst     Patient Active Problem List   Diagnosis Date Noted  . S/P right knee arthroscopy 05/23/20 05/28/2020  . Tear of meniscus of knee joint   . Synovitis of knee   . Primary osteoarthritis of right knee   . Encounter for screening fecal occult blood testing 04/09/2020  . Encounter for gynecological examination with Papanicolaou smear of  cervix 04/09/2020  . Urinary tract infection without hematuria 02/27/2020  . Dysuria 02/27/2020  . Urge incontinence 04/10/2018  . OAB (overactive bladder) 04/10/2018  . Urinary frequency 04/10/2018  . 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 CESAREAN SECTION    . KNEE ARTHROSCOPY WITH MEDIAL MENISECTOMY Right 05/23/2020   Procedure: KNEE ARTHROSCOPY WITH MEDIAL AND LATERAL MENISECTOMY; 3 COMPARTMENT SYNOVECTOMY;  Surgeon: Vickki Hearing, MD;  Location: [facility];  Service: Orthopedics;  Laterality: Right;  . TONSILLECTOMY    . TUBAL LIGATION       OB History    Gravida  1   Para  1   Term  1   Preterm      AB      Living  2     SAB      IAB      Ectopic      Multiple  1   Live Births  2           Family History  Problem Relation Age of Onset  . Cancer Paternal Grandfather        colon  . Cancer Maternal Grandmother        lung  . COPD Father   . Non-Hodgkin's lymphoma Mother   . Diabetes Mother   . Cancer Mother  cervical  . Congestive Heart Failure Mother   . Hypertension Sister   . Other Son        fluid around heart    Social History   Tobacco Use  . Smoking status: Current Every Day Smoker    Packs/day: 0.50    Years: 30.00    Pack years: 15.00    Types: Cigarettes  . Smokeless tobacco: Never Used  Vaping Use  . Vaping Use: Never used  Substance Use Topics  . Alcohol use: Yes    Comment: occasional  . Drug use: No    Home Medications Prior to Admission medications   Medication Sig Start Date End Date Taking? Authorizing Provider  etodolac (LODINE) 400 MG tablet Take 1 tablet (400 mg total) by mouth 2 (two) times daily. 05/28/20  Yes Vickki HearingHarrison, Stanley E, MD  HYDROcodone-acetaminophen [facility](NORCO) 10-325 MG tablet Take 1-2 tablets by mouth 4 (four) times daily as needed for moderate pain. 07/18/20  Yes [provider]  promethazine (PHENERGAN) 12.5 MG  tablet Take 1 tablet (12.5 mg total) by mouth every 6 (six) hours as needed for nausea or vomiting. Patient not taking: No sig reported 05/23/20   Vickki HearingHarrison, Stanley E, MD  Vibegron (GEMTESA) 75 MG TABS Take 1 daily Patient taking differently: Take 75 mg by mouth daily. 04/09/20   Adline PotterGriffin, Jennifer A, NP    Allergies    Patient has no known allergies.  Review of Systems   Review of Systems  All other systems reviewed and are negative.   Physical Exam Updated Vital Signs BP (!) 148/60   Pulse 67   Temp 97.6 F (36.4 C) (Oral)   Resp (!) 23   Ht 5\' 1"  (1.549 m)   Wt 127 kg   LMP 06/27/2017 (Within Weeks)   SpO2 93%   BMI 52.91 kg/m   Physical Exam Vitals and nursing note reviewed. Exam conducted with a chaperone present.  Constitutional:      General: She is not in acute distress.    Appearance: Normal appearance. She is not toxic-appearing.  HENT:     Head: Normocephalic and atraumatic.  Eyes:     General: No scleral icterus.    Conjunctiva/sclera: Conjunctivae normal.  Cardiovascular:     Rate and Rhythm: Normal rate and regular rhythm.     Pulses: Normal pulses.     Heart sounds: Normal heart sounds.  Pulmonary:     Effort: Pulmonary effort is normal. No respiratory distress.     Breath sounds: Normal breath sounds. No stridor. No wheezing, rhonchi or rales.     Comments: CTA bilaterally.  No increased work of breathing.  No tachypnea.  95% on room air. Abdominal:     General: Abdomen is flat. There is no distension.     Palpations: Abdomen is soft.     Tenderness: There is no abdominal tenderness. There is no guarding.     Comments: Protuberant, but nondistended.  Soft.  No areas of tenderness.  Specifically, no epigastric or right upper quadrant abdominal tenderness.  Musculoskeletal:        General: Normal range of motion.     Right lower leg: Edema present.     Left lower leg: Edema present.     Comments: 2+ pitting edema bilaterally  Skin:    General: Skin  is dry.  Neurological:     Mental Status: She is alert and oriented to person, place, and time.     GCS: GCS eye subscore is  4. GCS verbal subscore is 5. GCS motor subscore is 6.  Psychiatric:        Mood and Affect: Mood normal.        Behavior: Behavior normal.        Thought Content: Thought content normal.     ED Results / Procedures / Treatments   Labs (all labs ordered are listed, but only abnormal results are displayed) Labs Reviewed  COMPREHENSIVE METABOLIC PANEL - Abnormal; Notable for the following components:      Result Value   Glucose, Bld 127 (*)    Total Protein 8.2 (*)    Albumin 3.2 (*)    All other components within normal limits  BRAIN NATRIURETIC PEPTIDE - Abnormal; Notable for the following components:   B Natriuretic Peptide 200.0 (*)    All other components within normal limits  TROPONIN I (HIGH SENSITIVITY) - Abnormal; Notable for the following components:   Troponin I (High Sensitivity) 211 (*)    All other components within normal limits  TROPONIN I (HIGH SENSITIVITY) - Abnormal; Notable for the following components:   Troponin I (High Sensitivity) 273 (*)    All other components within normal limits  SARS CORONAVIRUS 2 (TAT 6-24 HRS)  CBC WITH DIFFERENTIAL/PLATELET  LIPASE, BLOOD  HEPARIN LEVEL (UNFRACTIONATED)  TROPONIN I (HIGH SENSITIVITY)    EKG EKG Interpretation  Date/Time:  Monday July 28 2020 11:01:17 EDT Ventricular Rate:  87 PR Interval:    QRS Duration: 90 QT Interval:  355 QTC Calculation: 427 R Axis:   74 Text Interpretation: Sinus rhythm Atrial premature complex Minimal ST depression, diffuse leads New since previous tracing Confirmed by Vanetta Mulders [phone]) on 07/28/2020 11:09:33 AM   Radiology DG Chest 2 View  Result Date: 07/28/2020 CLINICAL DATA:  Mid chest pain. EXAM: CHEST - 2 VIEW COMPARISON:  02/20/2018. FINDINGS: Trachea is midline. Heart is enlarged. Mild interstitial prominence and indistinctness, mid and  lower lung zone predominant. No pleural fluid. Flowing anterior osteophytosis in the thoracic spine. IMPRESSION: Pulmonary edema. Electronically Signed   By: Leanna Battles M.D.   On: 07/28/2020 11:52    Procedures .Critical Care Performed by: Lorelee New, PA-C Authorized by: Lorelee New, PA-C   Critical care provider statement:    Critical care time (minutes):  60   Critical care was necessary to treat or prevent imminent or life-threatening deterioration of the following conditions:  Cardiac failure   Critical care was time spent personally by me on the following activities:  Discussions with consultants, evaluation of patient's response to treatment, examination of patient, ordering and performing treatments and interventions, ordering and review of laboratory studies, ordering and review of radiographic studies, pulse oximetry, re-evaluation of patient's condition, obtaining history from patient or surrogate and review of old charts Comments:     Troponin >200     Medications Ordered in ED Medications  heparin ADULT infusion 100 units/mL (25000 units/256mL) (1,100 Units/hr Intravenous New Bag/Given 07/28/20 1500)  aspirin tablet 325 mg (325 mg Oral Given 07/28/20 1220)  heparin bolus via infusion 4,000 Units (4,000 Units Intravenous Bolus from Bag 07/28/20 1459)    ED Course  I have reviewed the triage vital signs and the nursing notes.  Pertinent labs & imaging results that were available during my care of the patient were reviewed by me and considered in my medical decision making (see chart for details).  Clinical Course as of 07/28/20 1556  Mon Jul 28, 2020  1522 Spoke with Dr. Tenny Craw,  cardiology, who will see patient. Suspects that she will need a cardiac catheterization, but will speak to cardiology at [facility] before determining her ultimate disposition. [GG]    Clinical Course User Index [GG] Lorelee New, PA-C   MDM Rules/Calculators/A&P HEAR Score: 5                         ROSAURA BOLON was evaluated in Emergency Department on 07/28/2020 for the symptoms described in the history of present illness. She was evaluated in the context of the global COVID-19 pandemic, which necessitated consideration that the patient might be at risk for infection with the SARS-CoV-2 virus that causes COVID-19. Institutional protocols and algorithms that pertain to the evaluation of patients at risk for COVID-19 are in a state of rapid change based on information released by regulatory bodies including the CDC and federal and state organizations. These policies and algorithms were followed during the patient's care in the ED.  I personally reviewed patient's medical chart and all notes from triage and staff during today's encounter. I have also ordered and reviewed all labs and imaging that I felt to be medically necessary in the evaluation of this patient's complaints and with consideration of their physical exam. If needed, translation services were available and utilized.   Patient has a HEAR score of 5 given her active smoking, associated nausea, ST changes, and family history of CAD.  Patient with a 3-day history of squeezing substernal chest discomfort.  Denies any radiation symptoms.  Denies any associated nausea, diaphoresis, or shortness of breath symptoms.  While she does have recent knee surgery 05/2020, denies any pleuritic symptoms or unilateral swelling or edema.  Her vital signs have been stable here in the ED.  No significant current chest pain.  Chest x-ray with evidence of pulmonary edema.  Will obtain BNP.  CBC and CMP unremarkable. Lipase within normal limits.  BNP elevated to 200.  Troponin from 211 >> 273.  Will initiate heparin per pharmacy for NSTEMI.  Spoke with Dr. Tenny Craw, cardiology, who will see patient. Suspects that she will need a cardiac catheterization, but will speak to cardiology at [facility] before determining her ultimate disposition.  Cardiology to  admit here for NSTEMI.    Final Clinical Impression(s) / ED Diagnoses Final diagnoses:  Elevated troponin    Rx / DC Orders ED Discharge Orders    None       Lorelee New, PA-C 07/28/20 1556    Vanetta Mulders, MD 08/05/20 [phone]

## 2020-07-28 NOTE — ED Triage Notes (Signed)
Pt presents to ED with complaints of mid chest pain since Saturday, feels like burning, worse at night.

## 2020-07-28 NOTE — ED Notes (Signed)
Repaged Dr Tenny Craw with Cardiology .

## 2020-07-28 NOTE — Progress Notes (Signed)
*  PRELIMINARY RESULTS* Echocardiogram 2D Echocardiogram has been performed with Definity.  Stacey Drain 07/28/2020, 5:00 PM

## 2020-07-29 ENCOUNTER — Encounter (HOSPITAL_COMMUNITY): Payer: Self-pay | Admitting: Internal Medicine

## 2020-07-29 ENCOUNTER — Inpatient Hospital Stay (HOSPITAL_COMMUNITY)
Admission: EM | Disposition: A | Discharge status: Home / Self Care | Payer: Self-pay | Source: Home / Self Care | Attending: Internal Medicine

## 2020-07-29 DIAGNOSIS — F17210 Nicotine dependence, cigarettes, uncomplicated: Secondary | ICD-10-CM | POA: Diagnosis present

## 2020-07-29 DIAGNOSIS — Z6841 Body Mass Index (BMI) 40.0 and over, adult: Secondary | ICD-10-CM | POA: Diagnosis not present

## 2020-07-29 DIAGNOSIS — Z807 Family history of other malignant neoplasms of lymphoid, hematopoietic and related tissues: Secondary | ICD-10-CM | POA: Diagnosis not present

## 2020-07-29 DIAGNOSIS — I200 Unstable angina: Secondary | ICD-10-CM | POA: Diagnosis present

## 2020-07-29 DIAGNOSIS — I214 Non-ST elevation (NSTEMI) myocardial infarction: Secondary | ICD-10-CM | POA: Diagnosis present

## 2020-07-29 DIAGNOSIS — Z20822 Contact with and (suspected) exposure to covid-19: Secondary | ICD-10-CM | POA: Diagnosis present

## 2020-07-29 DIAGNOSIS — I5041 Acute combined systolic (congestive) and diastolic (congestive) heart failure: Secondary | ICD-10-CM

## 2020-07-29 DIAGNOSIS — E785 Hyperlipidemia, unspecified: Secondary | ICD-10-CM | POA: Diagnosis present

## 2020-07-29 DIAGNOSIS — R0902 Hypoxemia: Secondary | ICD-10-CM | POA: Diagnosis present

## 2020-07-29 DIAGNOSIS — I25110 Atherosclerotic heart disease of native coronary artery with unstable angina pectoris: Secondary | ICD-10-CM | POA: Diagnosis present

## 2020-07-29 DIAGNOSIS — E782 Mixed hyperlipidemia: Secondary | ICD-10-CM | POA: Diagnosis present

## 2020-07-29 DIAGNOSIS — Z825 Family history of asthma and other chronic lower respiratory diseases: Secondary | ICD-10-CM | POA: Diagnosis not present

## 2020-07-29 DIAGNOSIS — Z833 Family history of diabetes mellitus: Secondary | ICD-10-CM | POA: Diagnosis not present

## 2020-07-29 DIAGNOSIS — E876 Hypokalemia: Secondary | ICD-10-CM | POA: Diagnosis present

## 2020-07-29 DIAGNOSIS — Z716 Tobacco abuse counseling: Secondary | ICD-10-CM | POA: Diagnosis not present

## 2020-07-29 DIAGNOSIS — I255 Ischemic cardiomyopathy: Secondary | ICD-10-CM | POA: Diagnosis present

## 2020-07-29 DIAGNOSIS — Z8249 Family history of ischemic heart disease and other diseases of the circulatory system: Secondary | ICD-10-CM | POA: Diagnosis not present

## 2020-07-29 DIAGNOSIS — I110 Hypertensive heart disease with heart failure: Secondary | ICD-10-CM | POA: Diagnosis present

## 2020-07-29 HISTORY — DX: Acute combined systolic (congestive) and diastolic (congestive) heart failure: I50.41

## 2020-07-29 HISTORY — DX: Hyperlipidemia, unspecified: E78.5

## 2020-07-29 HISTORY — PX: RIGHT/LEFT HEART CATH AND CORONARY ANGIOGRAPHY: CATH118266

## 2020-07-29 HISTORY — PX: CORONARY STENT INTERVENTION: CATH118234

## 2020-07-29 LAB — CBC
HCT: 38.7 % (ref 36.0–46.0)
Hemoglobin: 12.4 g/dL (ref 12.0–15.0)
MCH: 28.3 pg (ref 26.0–34.0)
MCHC: 32 g/dL (ref 30.0–36.0)
MCV: 88.4 fL (ref 80.0–100.0)
Platelets: 305 10*3/uL (ref 150–400)
RBC: 4.38 MIL/uL (ref 3.87–5.11)
RDW: 15.2 % (ref 11.5–15.5)
WBC: 9.4 10*3/uL (ref 4.0–10.5)
nRBC: 0 % (ref 0.0–0.2)

## 2020-07-29 LAB — POCT I-STAT EG7
Acid-Base Excess: 4 mmol/L — ABNORMAL HIGH (ref 0.0–2.0)
Acid-Base Excess: 4 mmol/L — ABNORMAL HIGH (ref 0.0–2.0)
Bicarbonate: 30.8 mmol/L — ABNORMAL HIGH (ref 20.0–28.0)
Bicarbonate: 31.3 mmol/L — ABNORMAL HIGH (ref 20.0–28.0)
Calcium, Ion: 1.21 mmol/L (ref 1.15–1.40)
Calcium, Ion: 1.22 mmol/L (ref 1.15–1.40)
HCT: 39 % (ref 36.0–46.0)
HCT: 39 % (ref 36.0–46.0)
Hemoglobin: 13.3 g/dL (ref 12.0–15.0)
Hemoglobin: 13.3 g/dL (ref 12.0–15.0)
O2 Saturation: 67 %
O2 Saturation: 71 %
Potassium: 4 mmol/L (ref 3.5–5.1)
Potassium: 4 mmol/L (ref 3.5–5.1)
Sodium: 140 mmol/L (ref 135–145)
Sodium: 141 mmol/L (ref 135–145)
TCO2: 32 mmol/L (ref 22–32)
TCO2: 33 mmol/L — ABNORMAL HIGH (ref 22–32)
pCO2, Ven: 53.5 mmHg (ref 44.0–60.0)
pCO2, Ven: 55.3 mmHg (ref 44.0–60.0)
pH, Ven: 7.361 (ref 7.250–7.430)
pH, Ven: 7.368 (ref 7.250–7.430)
pO2, Ven: 37 mmHg (ref 32.0–45.0)
pO2, Ven: 39 mmHg (ref 32.0–45.0)

## 2020-07-29 LAB — BASIC METABOLIC PANEL
Anion gap: 8 (ref 5–15)
BUN: 13 mg/dL (ref 6–20)
CO2: 28 mmol/L (ref 22–32)
Calcium: 9.1 mg/dL (ref 8.9–10.3)
Chloride: 103 mmol/L (ref 98–111)
Creatinine, Ser: 0.77 mg/dL (ref 0.44–1.00)
GFR, Estimated: >60 mL/min (ref >60)
Glucose, Bld: 109 mg/dL — ABNORMAL HIGH (ref 70–99)
Potassium: 3.8 mmol/L (ref 3.5–5.1)
Sodium: 139 mmol/L (ref 135–145)

## 2020-07-29 LAB — POCT I-STAT 7, (LYTES, BLD GAS, ICA,H+H)
Acid-Base Excess: 4 mmol/L — ABNORMAL HIGH (ref 0.0–2.0)
Bicarbonate: 30 mmol/L — ABNORMAL HIGH (ref 20.0–28.0)
Calcium, Ion: 1.23 mmol/L (ref 1.15–1.40)
HCT: 38 % (ref 36.0–46.0)
Hemoglobin: 12.9 g/dL (ref 12.0–15.0)
O2 Saturation: 96 %
Potassium: 4 mmol/L (ref 3.5–5.1)
Sodium: 141 mmol/L (ref 135–145)
TCO2: 32 mmol/L (ref 22–32)
pCO2 arterial: 50.3 mmHg — ABNORMAL HIGH (ref 32.0–48.0)
pH, Arterial: 7.384 (ref 7.350–7.450)
pO2, Arterial: 83 mmHg (ref 83.0–108.0)

## 2020-07-29 LAB — LIPID PANEL
Cholesterol: 211 mg/dL — ABNORMAL HIGH (ref 0–200)
HDL: 22 mg/dL — ABNORMAL LOW (ref >40)
LDL Cholesterol: 152 mg/dL — ABNORMAL HIGH (ref 0–99)
Total CHOL/HDL Ratio: 9.6 RATIO
Triglycerides: 186 mg/dL — ABNORMAL HIGH (ref <150)
VLDL: 37 mg/dL (ref 0–40)

## 2020-07-29 LAB — POCT ACTIVATED CLOTTING TIME: Activated Clotting Time: 452 seconds

## 2020-07-29 LAB — HEPARIN LEVEL (UNFRACTIONATED): Heparin Unfractionated: <0.1 IU/mL — ABNORMAL LOW (ref 0.30–0.70)

## 2020-07-29 SURGERY — RIGHT/LEFT HEART CATH AND CORONARY ANGIOGRAPHY
Anesthesia: LOCAL

## 2020-07-29 MED ORDER — LABETALOL HCL 5 MG/ML IV SOLN: 10.0000 mg | INTRAVENOUS | Status: AC | PRN

## 2020-07-29 MED ORDER — HEPARIN (PORCINE) IN NACL 1000-0.9 UT/500ML-% IV SOLN
INTRAVENOUS | Status: DC | PRN
  Administered 2020-07-29 (×2): 500 mL

## 2020-07-29 MED ORDER — BIVALIRUDIN TRIFLUOROACETATE 250 MG IV SOLR
INTRAVENOUS | Status: AC
  Filled 2020-07-29: qty 250

## 2020-07-29 MED ORDER — ACETAMINOPHEN 325 MG PO TABS: 650.0000 mg | ORAL_TABLET | ORAL | Status: DC | PRN

## 2020-07-29 MED ORDER — MIDAZOLAM HCL 2 MG/2ML IJ SOLN
INTRAMUSCULAR | Status: AC
  Filled 2020-07-29: qty 2

## 2020-07-29 MED ORDER — NITROGLYCERIN 1 MG/10 ML FOR IR/CATH LAB
INTRA_ARTERIAL | Status: AC
  Filled 2020-07-29: qty 10

## 2020-07-29 MED ORDER — ONDANSETRON HCL 4 MG/2ML IJ SOLN: 4.0000 mg | Freq: Four times a day (QID) | INTRAMUSCULAR | Status: DC | PRN

## 2020-07-29 MED ORDER — SODIUM CHLORIDE 0.9% FLUSH: 3.0000 mL | INTRAVENOUS | Status: DC | PRN

## 2020-07-29 MED ORDER — FENTANYL CITRATE (PF) 100 MCG/2ML IJ SOLN
INTRAMUSCULAR | Status: DC | PRN
  Administered 2020-07-29 (×5): 25 ug via INTRAVENOUS

## 2020-07-29 MED ORDER — NITROGLYCERIN 1 MG/10 ML FOR IR/CATH LAB
INTRA_ARTERIAL | Status: DC | PRN
  Administered 2020-07-29 (×4): 200 ug via INTRACORONARY

## 2020-07-29 MED ORDER — BIVALIRUDIN BOLUS VIA INFUSION - CUPID
INTRAVENOUS | Status: DC | PRN
  Administered 2020-07-29: 94.575 mg via INTRAVENOUS

## 2020-07-29 MED ORDER — IOHEXOL 350 MG/ML SOLN
INTRAVENOUS | Status: DC | PRN
  Administered 2020-07-29: 195 mL

## 2020-07-29 MED ORDER — LIDOCAINE HCL (PF) 1 % IJ SOLN
INTRAMUSCULAR | Status: AC
  Filled 2020-07-29: qty 30

## 2020-07-29 MED ORDER — FENTANYL CITRATE (PF) 100 MCG/2ML IJ SOLN
INTRAMUSCULAR | Status: AC
  Filled 2020-07-29: qty 2

## 2020-07-29 MED ORDER — TICAGRELOR 90 MG PO TABS
ORAL_TABLET | ORAL | Status: DC | PRN
  Administered 2020-07-29: 180 mg via ORAL

## 2020-07-29 MED ORDER — SODIUM CHLORIDE 0.9 % IV SOLN: INTRAVENOUS | Status: AC

## 2020-07-29 MED ORDER — PROSOURCE PLUS PO LIQD: 30.0000 mL | Freq: Two times a day (BID) | ORAL | Status: DC

## 2020-07-29 MED ORDER — HYDRALAZINE HCL 20 MG/ML IJ SOLN
10.0000 mg | INTRAMUSCULAR | Status: AC | PRN
  Administered 2020-07-29: 10 mg via INTRAVENOUS
  Filled 2020-07-29: qty 1

## 2020-07-29 MED ORDER — HEPARIN BOLUS VIA INFUSION
2000.0000 [IU] | Freq: Once | INTRAVENOUS | Status: AC
  Administered 2020-07-29: 2000 [IU] via INTRAVENOUS
  Filled 2020-07-29: qty 2000

## 2020-07-29 MED ORDER — IOHEXOL 350 MG/ML SOLN
INTRAVENOUS | Status: AC
  Filled 2020-07-29: qty 1

## 2020-07-29 MED ORDER — FUROSEMIDE 10 MG/ML IJ SOLN
40.0000 mg | Freq: Once | INTRAMUSCULAR | Status: AC
  Administered 2020-07-29: 40 mg via INTRAVENOUS
  Filled 2020-07-29: qty 4

## 2020-07-29 MED ORDER — SODIUM CHLORIDE 0.9% FLUSH
3.0000 mL | Freq: Two times a day (BID) | INTRAVENOUS | Status: DC
  Administered 2020-07-30 – 2020-07-31 (×3): 3 mL via INTRAVENOUS

## 2020-07-29 MED ORDER — DIAZEPAM 5 MG PO TABS
5.0000 mg | ORAL_TABLET | Freq: Four times a day (QID) | ORAL | Status: DC | PRN
Start: 2020-07-29 — End: 2020-07-31

## 2020-07-29 MED ORDER — ASPIRIN 81 MG PO CHEW
81.0000 mg | CHEWABLE_TABLET | Freq: Every day | ORAL | Status: DC
  Administered 2020-07-30 – 2020-07-31 (×2): 81 mg via ORAL
  Filled 2020-07-29 (×2): qty 1

## 2020-07-29 MED ORDER — MIDAZOLAM HCL 2 MG/2ML IJ SOLN
INTRAMUSCULAR | Status: DC | PRN
  Administered 2020-07-29: 1 mg via INTRAVENOUS
  Administered 2020-07-29: 2 mg via INTRAVENOUS
  Administered 2020-07-29 (×2): 1 mg via INTRAVENOUS
  Administered 2020-07-29: 2 mg via INTRAVENOUS

## 2020-07-29 MED ORDER — SODIUM CHLORIDE 0.9 % IV SOLN
250.0000 mL | INTRAVENOUS | Status: DC | PRN
Start: 2020-07-29 — End: 2020-07-31

## 2020-07-29 MED ORDER — SODIUM CHLORIDE 0.9 % IV SOLN
INTRAVENOUS | Status: AC | PRN
  Administered 2020-07-29 (×2): 1.75 mg/kg/h via INTRAVENOUS

## 2020-07-29 MED ORDER — VERAPAMIL HCL 2.5 MG/ML IV SOLN
INTRAVENOUS | Status: AC
  Filled 2020-07-29: qty 2

## 2020-07-29 MED ORDER — HEPARIN SODIUM (PORCINE) 1000 UNIT/ML IJ SOLN
INTRAMUSCULAR | Status: AC
  Filled 2020-07-29: qty 1

## 2020-07-29 MED ORDER — ATORVASTATIN CALCIUM 80 MG PO TABS
80.0000 mg | ORAL_TABLET | Freq: Every day | ORAL | Status: DC
  Administered 2020-07-29 – 2020-07-31 (×3): 80 mg via ORAL
  Filled 2020-07-29 (×4): qty 1

## 2020-07-29 MED ORDER — TICAGRELOR 90 MG PO TABS
ORAL_TABLET | ORAL | Status: AC
  Filled 2020-07-29: qty 2

## 2020-07-29 MED ORDER — TICAGRELOR 90 MG PO TABS
90.0000 mg | ORAL_TABLET | Freq: Two times a day (BID) | ORAL | Status: DC
  Administered 2020-07-29 – 2020-07-31 (×4): 90 mg via ORAL
  Filled 2020-07-29 (×4): qty 1

## 2020-07-29 MED ORDER — HEPARIN (PORCINE) IN NACL 1000-0.9 UT/500ML-% IV SOLN
INTRAVENOUS | Status: AC
  Filled 2020-07-29: qty 1000

## 2020-07-29 MED ORDER — ATORVASTATIN CALCIUM 80 MG PO TABS: 80.0000 mg | ORAL_TABLET | Freq: Every day | ORAL | Status: DC

## 2020-07-29 MED ORDER — PROSOURCE PLUS PO LIQD
30.0000 mL | Freq: Two times a day (BID) | ORAL | Status: DC
  Administered 2020-07-30 – 2020-07-31 (×2): 30 mL via ORAL
  Filled 2020-07-29 (×4): qty 30

## 2020-07-29 MED ORDER — LIDOCAINE HCL (PF) 1 % IJ SOLN
INTRAMUSCULAR | Status: DC | PRN
  Administered 2020-07-29: 13 mL
  Administered 2020-07-29 (×2): 2 mL

## 2020-07-29 SURGICAL SUPPLY — 29 items
BALLN SAPPHIRE 2.0X12 (BALLOONS) ×2
BALLN SAPPHIRE 2.5X12 (BALLOONS) ×2
BALLN SAPPHIRE ~~LOC~~ 3.25X10 (BALLOONS) ×2 IMPLANT
BALLN SAPPHIRE ~~LOC~~ 3.75X12 (BALLOONS) ×2 IMPLANT
BALLOON SAPPHIRE 2.0X12 (BALLOONS) ×1 IMPLANT
BALLOON SAPPHIRE 2.5X12 (BALLOONS) ×1 IMPLANT
CATH BALLN WEDGE 5F 110CM (CATHETERS) ×2 IMPLANT
CATH INFINITI 5 FR 3DRC (CATHETERS) ×2 IMPLANT
CATH INFINITI 5FR MULTPACK ANG (CATHETERS) ×4 IMPLANT
CATH SWAN GANZ 7F STRAIGHT (CATHETERS) ×2 IMPLANT
CATH VISTA GUIDE 6FR XB3.5 (CATHETERS) ×2 IMPLANT
DEVICE RAD COMP TR BAND LRG (VASCULAR PRODUCTS) ×2 IMPLANT
GLIDESHEATH SLEND SS 6F .021 (SHEATH) ×2 IMPLANT
KIT ENCORE 26 ADVANTAGE (KITS) ×2 IMPLANT
KIT HEART LEFT (KITS) ×2 IMPLANT
MAT PREVALON FULL STRYKER (MISCELLANEOUS) ×2 IMPLANT
PACK CARDIAC CATHETERIZATION (CUSTOM PROCEDURE TRAY) ×2 IMPLANT
SHEATH GLIDE SLENDER 4/5FR (SHEATH) ×2 IMPLANT
SHEATH PINNACLE 5F 10CM (SHEATH) ×2 IMPLANT
SHEATH PINNACLE 6F 10CM (SHEATH) ×2 IMPLANT
SHEATH PINNACLE 7F 10CM (SHEATH) ×2 IMPLANT
SHEATH PROBE COVER 6X72 (BAG) ×2 IMPLANT
STENT RESOLUTE ONYX 3.0X15 (Permanent Stent) ×2 IMPLANT
STENT RESOLUTE ONYX 3.5X15 (Permanent Stent) ×2 IMPLANT
TRANSDUCER W/STOPCOCK (MISCELLANEOUS) ×2 IMPLANT
TUBING CIL FLEX 10 FLL-RA (TUBING) ×2 IMPLANT
WIRE COUGAR XT STRL 190CM (WIRE) ×2 IMPLANT
WIRE EMERALD 3MM-J .025X260CM (WIRE) ×2 IMPLANT
WIRE EMERALD 3MM-J .035X150CM (WIRE) ×2 IMPLANT

## 2020-07-29 NOTE — Plan of Care (Signed)

## 2020-07-29 NOTE — Progress Notes (Signed)
Site area: right groin  Site Prior to Removal:  Level 0  Pressure Applied For 45 MINUTES    Minutes Beginning at 1900  Manual:   Yes.    Patient Status During Pull:  stable  Post Pull Groin Site:  Level 0  Post Pull Instructions Given:  Yes.    Post Pull Pulses Present:  Yes.    Dressing Applied:  Yes.    Comments:  Given hydralazine for blood pressure greater than 150 systolic. Hematoma noted distal to site and medial to site, expressed. Patient tolerated well with emotional support given,during hold. Bedrest started at 24. Dressing applied. Site checked at 2000 and 2015 with no change noted. Patient eating at this time and denies any pain or shortness of breath.   Patient had shortness of breath earlier with O2 sats in upper 90's and given cola, shortness of breath resolved with cola. Merry Proud RN and patient aware that if shortness of breath reoccurs to use cola to resolve as it may be related to'Brilinta administration

## 2020-07-29 NOTE — Progress Notes (Signed)
Heart Failure Stewardship Pharmacist Progress Note   PCP: Elfredia Nevins, MD PCP-Cardiologist: Dietrich Pates, MD    HPI:  55 yo F with PMH of obesity and tobacco use. She presented to the ED on 07/28/20 with chest pain. An ECHO was done on 07/28/20 and LVEF was reduced to 30%. Pending R/LHC today.   Current HF Medications: Metoprolol XL 25 mg daily  Prior to admission HF Medications: None  Pertinent Lab Values: . Serum creatinine 0.77, BUN 13, Potassium 3.8, Sodium 139, BNP 200  Vital Signs: . Weight: 278 lbs (admission weight: 274 lbs) . Blood pressure: 140/70s  . Heart rate: 60-70s  Medication Assistance / Insurance Benefits Check: Does the patient have prescription insurance?  Yes Type of insurance plan: [employer] - commercial insurance  Outpatient Pharmacy:  Prior to admission outpatient pharmacy: [facility] Is the patient willing to use [facility] TOC pharmacy at discharge? Yes Is the patient willing to transition their outpatient pharmacy to utilize a [facility] outpatient pharmacy?   Pending    Assessment: 1. Acute systolic CHF (EF 47%), pending ischemic evaluation. NYHA class II symptoms. - No scheduled diuretics, pending RHC today - Continue metoprolol XL 25 mg daily - Consider starting Entresto 24/26 mg BID after cath - Consider starting spironolactone prior to discharge - Consider starting Farxiga 10 mg daily prior to discharge   Plan: 1) Medication changes recommended at this time: - Start Entresto 24/26 mg BID following cath  2) Patient assistance: Sherryll Burger copay $20 per month - Farxiga copay $15 per month - Can enroll in monthly copay cards to lower monthly copay for Entresto to $10 per 1-3 month supply; and Farxiga to $0 per month  3)  Education  - To be completed prior to discharge  Sharen Hones, PharmD, BCPS Heart Failure Engineer, building services Phone [phone]

## 2020-07-29 NOTE — Progress Notes (Incomplete)
Progress Note  Patient Name: Kristine Montgomery Date of Encounter: 07/29/2020  [facility] Cardiologist: No primary care provider on file.   Subjective   ***  Inpatient Medications    Scheduled Meds: . aspirin  324 mg Oral NOW   Or  . aspirin  300 mg Rectal NOW  . aspirin EC  81 mg Oral Daily  . etodolac  400 mg Oral BID  . metoprolol succinate  25 mg Oral Daily  . sodium chloride flush  3 mL Intravenous Q12H   Continuous Infusions: . sodium chloride    . sodium chloride    . sodium chloride 75 mL/hr at 07/29/20 0618  . heparin 1,600 Units/hr (07/29/20 0624)   PRN Meds: sodium chloride, sodium chloride, acetaminophen, HYDROcodone-acetaminophen, nitroGLYCERIN, ondansetron (ZOFRAN) IV, sodium chloride flush   Vital Signs    Vitals:   07/28/20 1645 07/28/20 1958 07/29/20 0002 07/29/20 0415  BP:  (!) 146/92 119/82 121/83  Pulse: 85 78 63 68  Resp: (!) 32 (!) 22 20 18   Temp:  97.9 F (36.6 C)  98.3 F (36.8 C)  TempSrc:  Oral  Oral  SpO2: 93% 90% 90% 94%  Weight:  124.6 kg  126.1 kg  Height:  5\' 1"  (1.549 m)      Intake/Output Summary (Last 24 hours) at 07/29/2020 Last data filed at 07/29/2020 0626 Gross per 24 hour  Intake 499.22 ml  Output 300 ml  Net 199.22 ml   Last 3 Weights 07/29/2020 07/28/2020 07/28/2020  Weight (lbs) 278 lb 274 lb 12.8 oz 280 lb  Weight (kg) 126.1 kg 124.648 kg 127.007 kg      Telemetry    *** - Personally Reviewed  ECG    *** - Personally Reviewed  Physical Exam  *** GEN: No acute distress.   Neck: No JVD Cardiac: RRR, no murmurs, rubs, or gallops.  Respiratory: Clear to auscultation bilaterally. GI: Soft, nontender, non-distended  MS: No edema; No deformity. Neuro:  Nonfocal  Psych: Normal affect   Labs    High Sensitivity Troponin:   Recent Labs  Lab 07/28/20 1100 07/28/20 1248 07/28/20 1502  TROPONINIHS 211* 273* 390*      Chemistry Recent Labs  Lab 07/28/20 1100  NA 137  K 4.0  CL 103  CO2 23   GLUCOSE 127*  BUN 14  CREATININE 0.53  CALCIUM 8.9  PROT 8.2*  ALBUMIN 3.2*  AST 18  ALT 15  ALKPHOS 96  BILITOT 0.7  GFRNONAA >60  ANIONGAP 11     Hematology Recent Labs  Lab 07/28/20 1100  WBC 8.5  RBC 4.64  HGB 13.1  HCT 41.7  MCV 89.9  MCH 28.2  MCHC 31.4  RDW 15.1  PLT 305    BNP Recent Labs  Lab 07/28/20 1211  BNP 200.0*     DDimer No results for input(s): DDIMER in the last 168 hours.   Radiology    DG Chest 2 View  Result Date: 07/28/2020 CLINICAL DATA:  Mid chest pain. EXAM: CHEST - 2 VIEW COMPARISON:  02/20/2018. FINDINGS: Trachea is midline. Heart is enlarged. Mild interstitial prominence and indistinctness, mid and lower lung zone predominant. No pleural fluid. Flowing anterior osteophytosis in the thoracic spine. IMPRESSION: Pulmonary edema. Electronically Signed   By: 07/30/2020 M.D.   On: 07/28/2020 11:52   ECHOCARDIOGRAM COMPLETE  Result Date: 07/28/2020    ECHOCARDIOGRAM REPORT   Patient Name:   Kristine Montgomery Date of Exam: 07/28/2020 Medical Rec #:  [id number]      Height:       61.0 in Accession #:    [id number]     Weight:       280.0 lb Date of Birth:  [birth year]      BSA:          2.179 m Patient Age:    55 years       BP:           148/60 mmHg Patient Gender: F              HR:           67 bpm. [facility] Procedure: 2D Echo, Cardiac Doppler and Color Doppler Indications:    Elevated Troponin  History:        Patient has no prior history of Echocardiogram examinations. Hx                 of tobacco abuse, Obesity.  Sonographer:    Celesta Gentile RCS Referring Phys: [id number] PAULA V ROSS IMPRESSIONS  1. Poor acoustic windows limit study Definity used. Overall LVEF is approximately 30% with diffuse hypokinesis worse in the septal, mid/distal anterior, distal inferior, distal lateral and apical walls. . The left ventricular internal cavity size was mildly to moderately dilated. Left ventricular diastolic parameters are indeterminate.   2. Right ventricular systolic function is normal. The right ventricular size is normal.  3. Mild mitral valve regurgitation.  4. The aortic valve is tricuspid. Aortic valve regurgitation is not visualized. Mild aortic valve sclerosis is present, with no evidence of aortic valve stenosis.  5. The inferior vena cava is dilated in size with >50% respiratory variability, suggesting right atrial pressure of 8 mmHg. FINDINGS  Left Ventricle: Poor acoustic windows limit study Definity used. Overall LVEF is approximately 30% with diffuse hypokinesis worse in the septal, mid/distal anterior, distal inferior, distal lateral and apical walls. Left ventricular ejection fraction, by estimation, is 30%%. The left ventricle has severely decreased function. The left ventricle demonstrates global hypokinesis. Definity contrast agent was given IV to delineate the left ventricular endocardial borders. The left ventricular internal cavity size was mildly to moderately dilated. There is no left ventricular hypertrophy. Left ventricular diastolic parameters are consistent with Grade II diastolic dysfunction (pseudonormalization). Right Ventricle: The right ventricular size is normal. Right vetricular wall thickness was not assessed. Right ventricular systolic function is normal. Left Atrium: Left atrial size was normal in size. Right Atrium: Right atrial size was normal in size. Pericardium: There is no evidence of pericardial effusion. Mitral Valve: The mitral valve is normal in structure. Mild mitral valve regurgitation. Tricuspid Valve: The tricuspid valve is grossly normal. Tricuspid valve regurgitation is mild. Aortic Valve: The aortic valve is tricuspid. Aortic valve regurgitation is not visualized. Mild aortic valve sclerosis is present, with no evidence of aortic valve stenosis. Pulmonic Valve: The pulmonic valve was normal in structure. Pulmonic valve regurgitation is trivial. Aorta: The aortic root is normal in size and  structure. Venous: The inferior vena cava is dilated in size with greater than 50% respiratory variability, suggesting right atrial pressure of 8 mmHg. IAS/Shunts: The interatrial septum was not assessed.  LEFT VENTRICLE PLAX 2D LVIDd:         5.40 cm  Diastology LVIDs:         4.50 cm  LV e' medial:    4.79 cm/s LV PW:         1.00 cm  LV E/e'  medial:  26.3 LV IVS:        1.00 cm  LV e' lateral:   5.98 cm/s LVOT diam:     2.00 cm  LV E/e' lateral: 21.1 LV SV:         76 LV SV Index:   35 LVOT Area:     3.14 cm  RIGHT VENTRICLE RV S prime:     14.83 cm/s TAPSE (M-mode): 2.0 cm LEFT ATRIUM             Index       RIGHT ATRIUM           Index LA diam:        4.30 cm 1.97 cm/m  RA Area:     18.80 cm LA Vol (A2C):   48.6 ml 22.30 ml/m RA Volume:   48.10 ml  22.07 ml/m LA Vol (A4C):   68.2 ml 31.30 ml/m LA Biplane Vol: 57.7 ml 26.48 ml/m  AORTIC VALVE LVOT Vmax:   125.00 cm/s LVOT Vmean:  80.500 cm/s LVOT VTI:    0.243 m  AORTA Ao Root diam: 3.30 cm MITRAL VALVE MV Area (PHT): 4.33 cm     SHUNTS MV Decel Time: 175 msec     Systemic VTI:  0.24 m MV E velocity: 126.00 cm/s  Systemic Diam: 2.00 cm MV A velocity: 97.90 cm/s MV E/A ratio:  1.29 Dietrich Pates MD Electronically signed by Dietrich Pates MD Signature Date/Time: 07/28/2020/6:28:14 PM    Final     Cardiac Studies   Echo: 07/28/20  IMPRESSIONS    1. Poor acoustic windows limit study Definity used. Overall LVEF is  approximately 30% with diffuse hypokinesis worse in the septal, mid/distal  anterior, distal inferior, distal lateral and apical walls. . The left  ventricular internal cavity size was  mildly to moderately dilated. Left ventricular diastolic parameters are  indeterminate.  2. Right ventricular systolic function is normal. The right ventricular  size is normal.  3. Mild mitral valve regurgitation.  4. The aortic valve is tricuspid. Aortic valve regurgitation is not  visualized. Mild aortic valve sclerosis is present, with no evidence  of  aortic valve stenosis.  5. The inferior vena cava is dilated in size with >50% respiratory  variability, suggesting right atrial pressure of 8 mmHg.   Patient Profile     55 y.o. female with PMH pf tobacco use who presented to the ED with chest pain and found to have elevated troponin.   Assessment & Plan    1. NSTEMI: hsTn peaked at 390. Given presenting symptoms, she was transferred to [facility] for further management with plans for cardiac cath.   2. New systolic HF: EF noted at 30% with diffuse hypokinesis with septal, mid/distal, anterior, distal inferior, and distal lateral/apical wall. Unclear etiology at this point.   3. PAD:   4. Tobacco Use:  For questions or updates, please contact [facility] Please consult [email or web address] for contact info under        Signed, Laverda Page, NP  07/29/2020, 7:12 AM

## 2020-07-29 NOTE — Progress Notes (Addendum)
ANTICOAGULATION CONSULT NOTE  Pharmacy Consult for heparin Indication: chest pain/ACS  No Known Allergies  Patient Measurements: Height: 5\' 1"  (154.9 cm) Weight: 126.1 kg (278 lb) IBW/kg (Calculated) : 47.8 Heparin Dosing Weight: 80 kg  Vital Signs: Temp: 98.3 F (36.8 C) (03/15 0415) Temp Source: Oral (03/15 0415) BP: 121/83 (03/15 0415) Pulse Rate: 68 (03/15 0415)  Labs: Recent Labs    07/28/20 1100 07/28/20 1248 07/28/20 1502 07/28/20 2050  HGB 13.1  --   --   --   HCT 41.7  --   --   --   PLT 305  --   --   --   HEPARINUNFRC  --   --   --  <0.10*  CREATININE 0.53  --   --   --   TROPONINIHS 211* 273* 390*  --     Estimated Creatinine Clearance: 99.2 mL/min (by C-G formula based on SCr of 0.53 mg/dL).  Assessment: 55 y.o. female with chest pain for heparin  Goal of Therapy:  Heparin level 0.3-0.7 units/ml Monitor platelets by anticoagulation protocol: Yes   Plan:  Heparin 2000 units IV bolus, then increase heparin  1600 units/hr Follow up after cath today   52, PharmD, BCPS  07/29/2020 4:43 AM

## 2020-07-29 NOTE — Progress Notes (Signed)
Pt desatting when asleep, with sats dropping as low as 79% on room air.  Nasal cannula applied @ 3L/min.  Pt states that she does not have a history of sleep apnea, and she does not use oxygen or CPAP at home.  Will continue to monitor.  Alonza Bogus

## 2020-07-29 NOTE — Progress Notes (Addendum)
Initial Nutrition Assessment  DOCUMENTATION CODES:   Morbid obesity  INTERVENTION:  Once diet advances, provide 30 ml Prosource plus po BID, each supplement provides 100 kcal and 15 grams of protein.   Diet education handout placed in discharge instructions.   NUTRITION DIAGNOSIS:   Increased nutrient needs related to chronic illness (CHF) as evidenced by estimated needs.  GOAL:   Patient will meet greater than or equal to 90% of their needs  MONITOR:   Supplement acceptance,Skin,Weight trends,Labs,I & O's,Diet advancement  REASON FOR ASSESSMENT:   Consult Diet education  ASSESSMENT:   55 y.o. female w/ hx tobacco use was admitted 03/14 with chest pain, cath planned. Pt with acute Combined Systolic and Diastolic Heart Failure.  Pt is currently undergoing cardiac cath procedure. RD to order Prostat to aid in increased caloric and protein needs. RD consulted for diet education. Handout "Low sodium Nutrition Therapy" from the Academy of [employer] Dietetics manual placed in pt discharge instructions.   Unable to complete Nutrition-Focused physical exam at this time.  Labs and medications reviewed.   Diet Order:   Diet Order            Diet NPO time specified Except for: Sips with Meds  Diet effective midnight                 EDUCATION NEEDS:   Education needs have been addressed  Skin:  Skin Assessment: Reviewed RN Assessment  Last BM:  3/13  Height:   Ht Readings from Last 1 Encounters:  07/28/20 5\' 1"  (1.549 m)    Weight:   Wt Readings from Last 1 Encounters:  07/29/20 126.1 kg    BMI:  Body mass index is 52.53 kg/m.  Estimated Nutritional Needs:   Kcal:  1900-2100  Protein:  105-120 grams  Fluid:  >/= 1.9 L/day  07/31/20, MS, RD, LDN RD pager number/after hours weekend pager number on Am[facility].

## 2020-07-29 NOTE — Discharge Instructions (Signed)
Low Sodium Nutrition Therapy  Eating less sodium can help you if you have high blood pressure, heart failure, or kidney or liver disease.   Your body needs a little sodium, but too much sodium can cause your body to hold onto extra water. This extra water will raise your blood pressure and can cause damage to your heart, kidneys, or liver as they are forced to work harder.   Sometimes you can see how the extra fluid affects you because your hands, legs, or belly swell. You may also hold water around your heart and lungs, which makes it hard to breathe.   Even if you take medication for blood pressure or a water pill (diuretic) to remove fluid, it is still important to have less salt in your diet.   Check with your primary care provider before drinking alcohol since it may affect the amount of fluid in your body and how your heart, kidneys, or liver work. Sodium in Food A low-sodium meal plan limits the sodium that you get from food and beverages to 1,500-2,000 milligrams (mg) per day. Salt is the main source of sodium. Read the nutrition label on the package to find out how much sodium is in one serving of a food.  . Select foods with 140 milligrams (mg) of sodium or less per serving.  . You may be able to eat one or two servings of foods with a little more than 140 milligrams (mg) of sodium if you are closely watching how much sodium you eat in a day.  . Check the serving size on the label. The amount of sodium listed on the label shows the amount in one serving of the food. So, if you eat more than one serving, you will get more sodium than the amount listed.  Tips Cutting Back on Sodium . Eat more fresh foods.  . Fresh fruits and vegetables are low in sodium, as well as frozen vegetables and fruits that have no added juices or sauces.  . Fresh meats are lower in sodium than processed meats, such as bacon, sausage, and hotdogs.  . Not all processed foods are unhealthy, but some processed foods  may have too much sodium.  . Eat less salt at the table and when cooking. One of the ingredients in salt is sodium.  . One teaspoon of table salt has 2,300 milligrams of sodium.  . Leave the salt out of recipes for pasta, casseroles, and soups. . Be a smart shopper.  . Food packages that say "Salt-free", sodium-free", "very low sodium," and "low sodium" have less than 140 milligrams of sodium per serving.  . Beware of products identified as "Unsalted," "No Salt Added," "Reduced Sodium," or "Lower Sodium." These items may still be high in sodium. You should always check the nutrition label. . Add flavors to your food without adding sodium.  . Try lemon juice, lime juice, or vinegar.  . Dry or fresh herbs add flavor.  . Buy a sodium-free seasoning blend or make your own at home. . You can purchase salt-free or sodium-free condiments like barbeque sauce in stores and online. Ask your registered dietitian nutritionist for recommendations and where to find them.  .  Eating in Restaurants . Choose foods carefully when you eat outside your home. Restaurant foods can be very high in sodium. Many restaurants provide nutrition facts on their menus or their websites. If you cannot find that information, ask your server. Let your server know that you want your food   to be cooked without salt and that you would like your salad dressing and sauces to be served on the side.  .   . Foods Recommended . Food Group . Foods Recommended  . Grains . Bread, bagels, rolls without salted tops Homemade bread made with reduced-sodium baking powder Cold cereals, especially shredded wheat and puffed rice Oats, grits, or cream of wheat Pastas, quinoa, and rice Popcorn, pretzels or crackers without salt Corn tortillas  . Protein Foods . Fresh meats and fish; turkey bacon (check the nutrition labels - make sure they are not packaged in a sodium solution) Canned or packed tuna (no more than 4 ounces at 1 serving) Beans  and peas Soybeans) and tofu Eggs Nuts or nut butters without salt  . Dairy . Milk or milk powder Plant milks, such as rice and soy Yogurt, including Greek yogurt Small amounts of natural cheese (blocks of cheese) or reduced-sodium cheese can be used in moderation. (Swiss, ricotta, and fresh mozzarella cheese are lower in sodium than the others) Cream Cheese Low sodium cottage cheese  . Vegetables . Fresh and frozen vegetables without added sauces or salt Homemade soups (without salt) Low-sodium, salt-free or sodium-free canned vegetables and soups  . Fruit . Fresh and canned fruits Dried fruits, such as raisins, cranberries, and prunes  . Oils . Tub or liquid margarine, regular or without salt Canola, corn, peanut, olive, safflower, or sunflower oils  . Condiments . Fresh or dried herbs such as basil, bay leaf, dill, mustard (dry), nutmeg, paprika, parsley, rosemary, sage, or thyme.  Low sodium ketchup Vinegar  Lemon or lime juice Pepper, red pepper flakes, and cayenne. Hot sauce contains sodium, but if you use just a drop or two, it will not add up to much.  Salt-free or sodium-free seasoning mixes and marinades Simple salad dressings: vinegar and oil  .  . Foods Not Recommended . Food Group . Foods Not Recommended  . Grains . Breads or crackers topped with salt Cereals (hot/cold) with more than 300 mg sodium per serving Biscuits, cornbread, and other "quick" breads prepared with baking soda Pre-packaged bread crumbs Seasoned and packaged rice and pasta mixes Self-rising flours  . Protein Foods . Cured meats: Bacon, ham, sausage, pepperoni and hot dogs Canned meats (chili, vienna sausage, or sardines) Smoked fish and meats Frozen meals that have more than 600 mg of sodium per serving Egg substitute (with added sodium)  . Dairy . Buttermilk Processed cheese spreads Cottage cheese (1 cup may have over 500 mg of sodium; look for low-sodium.) American or feta cheese Shredded  Cheese has more sodium than blocks of cheese String cheese  . Vegetables . Canned vegetables (unless they are salt-free, sodium-free or low sodium) Frozen vegetables with seasoning and sauces Sauerkraut and pickled vegetables Canned or dried soups (unless they are salt-free, sodium-free, or low sodium) French fries and onion rings  . Fruit . Dried fruits preserved with additives that have sodium  . Oils . Salted butter or margarine, all types of olives  . Condiments . Salt, sea salt, kosher salt, onion salt, and garlic salt Seasoning mixes with salt Bouillon cubes Ketchup Barbeque sauce and Worcestershire sauce unless low sodium Soy sauce Salsa, pickles, olives, relish Salad dressings: ranch, blue cheese, Italian, and French.  .  . Low Sodium Sample 1-Day Menu  . Breakfast . 1 cup cooked oatmeal  . 1 slice whole wheat bread toast  . 1 tablespoon peanut butter without salt  . 1 banana  .   1 cup 1% milk  . Lunch . Tacos made with: 2 corn tortillas  .  cup black beans, low sodium  .  cup roasted or grilled chicken (without skin)  .  avocado  . Squeeze of lime juice  . 1 cup salad greens  . 1 tablespoon low-sodium salad dressing  .  cup strawberries  . 1 orange  . Afternoon Snack . 1/3 cup grapes  . 6 ounces yogurt  . Evening Meal . 3 ounces herb-baked fish  . 1 baked potato  . 2 teaspoons olive oil  .  cup cooked carrots  . 2 thick slices tomatoes on:  . 2 lettuce leaves  . 1 teaspoon olive oil  . 1 teaspoon balsamic vinegar  . 1 cup 1% milk  . Evening Snack . 1 apple  .  cup almonds without salt  .  . Low-Sodium Vegetarian (Lacto-Ovo) Sample 1-Day Menu  . Breakfast . 1 cup cooked oatmeal  . 1 slice whole wheat toast  . 1 tablespoon peanut butter without salt  . 1 banana  . 1 cup 1% milk  . Lunch . Tacos made with: 2 corn tortillas  .  cup black beans, low sodium  .  cup roasted or grilled chicken (without skin)  .  avocado  . Squeeze of lime juice  . 1  cup salad greens  . 1 tablespoon low-sodium salad dressing  .  cup strawberries  . 1 orange  . Evening Meal . Stir fry made with:  cup tofu  . 1 cup brown rice  .  cup broccoli  .  cup green beans  .  cup peppers  .  tablespoon peanut oil  . 1 orange  . 1 cup 1% milk  . Evening Snack . 4 strips celery  . 2 tablespoons hummus  . 1 hard-boiled egg  .  . Low-Sodium Vegan Sample 1-Day Menu  . Breakfast . 1 cup cooked oatmeal  . 1 tablespoon peanut butter without salt  . 1 cup blueberries  . 1 cup soymilk fortified with calcium, vitamin B12, and vitamin D  . Lunch . 1 small whole wheat pita  .  cup cooked lentils  . 2 tablespoons hummus  . 4 carrot sticks  . 1 medium apple  . 1 cup soymilk fortified with calcium, vitamin B12, and vitamin D  . Evening Meal . Stir fry made with:  cup tofu  . 1 cup brown rice  .  cup broccoli  .  cup green beans  .  cup peppers  .  tablespoon peanut oil  . 1 cup cantaloupe  . Evening Snack . 1 cup soy yogurt  .  cup mixed nuts  . Copyright 2020  Academy of [employer] and [employer]. All rights reserved .  . Sodium Free Flavoring Tips .  . When cooking, the following items may be used for flavoring instead of salt or seasonings that contain sodium. . Remember: A little bit of spice goes a long way! Be careful not to overseason. . Spice Blend Recipe (makes about ? cup) . 5 teaspoons onion powder  . 2 teaspoons garlic powder  . 2 teaspoons paprika  . 2 teaspoon dry mustard  . 1 teaspoon crushed thyme leaves  .  teaspoon white pepper  .  teaspoon celery seed Food Item Flavorings  Beef Basil, bay leaf, caraway, curry, dill, dry mustard, garlic, grape jelly, green pepper, mace, marjoram, mushrooms (fresh), nutmeg, onion or onion powder, parsley, pepper,   rosemary, sage  Chicken Basil, cloves, cranberries, mace, mushrooms (fresh), nutmeg, oregano, paprika, parsley, pineapple, saffron, sage, savory, tarragon, thyme, tomato,  turmeric  Egg Chervil, curry, dill, dry mustard, garlic or garlic powder, green pepper, jelly, mushrooms (fresh), nutmeg, onion powder, paprika, parsley, rosemary, tarragon, tomato  Fish Basil, bay leaf, chervil, curry, dill, dry mustard, green pepper, lemon juice, marjoram, mushrooms (fresh), paprika, pepper, tarragon, tomato, turmeric  Lamb Cloves, curry, dill, garlic or garlic powder, mace, mint, mint jelly, onion, oregano, parsley, pineapple, rosemary, tarragon, thyme  Pork Applesauce, basil, caraway, chives, cloves, garlic or garlic powder, onion or onion powder, rosemary, thyme  Veal Apricots, basil, bay leaf, currant jelly, curry, ginger, marjoram, mushrooms (fresh), oregano, paprika  Vegetables Basil, dill, garlic or garlic powder, ginger, lemon juice, mace, marjoram, nutmeg, onion or onion powder, tarragon, tomato, sugar or sugar substitute, salt-free salad dressing, vinegar  Desserts Allspice, anise, cinnamon, cloves, ginger, mace, nutmeg, vanilla extract, other extracts   Copyright 2020  Academy of [employer] and [employer]etics. All rights reserved  Fluid Restricted Nutrition Therapy  You have been prescribed this diet because your condition affects how much fluid you can eat or drink. If your heart, liver, or kidneys aren't working properly, you may not be able to effectively eliminate fluids from the body and this may cause swelling (edema) in the legs, arms, and/or stomach. Drink no more than _________ liters or ________ ounces or ________cups of fluid per day.  . You don't need to stop eating or drinking the same fluids you normally would, but you may need to eat or drink less than usual.  . Your registered dietitian nutritionist will help you determine the correct amount of fluid to consume during the day Breakfast Include fluids taken with medications  Lunch Include fluids taken with medications  Dinner Include fluids taken with medications  Bedtime Snack Include fluids taken with  medications     Tips What Are Fluids?  A fluid is anything that is liquid or anything that would melt if left at room temperature. You will need to count these foods and liquids--including any liquid used to take medication--as part of your daily fluid intake. Some examples are: . Alcohol (drink only with your doctor's permission)  . Coffee, tea, and other hot beverages  . Gelatin (Jell-O)  . Gravy  . Ice cream, sherbet, sorbet  . Ice cubes, ice chips  . Milk, liquid creamer  . Nutritional supplements  . Popsicles  . Vegetable and fruit juices; fluid in canned fruit  . Watermelon  . Yogurt  . Soft drinks, lemonade, limeade  . Soups  . Syrup How Do I Measure My Fluid Intake? . Record your fluid intake daily.  . Tip: Every day, each time you eat or drink fluids, pour water in the same amount into an empty container that can hold the same amount of fluids you are allowed daily. This may help you keep track of how much fluid you are taking in throughout the day.  . To accurately keep track of how much liquid you take in, measure the size of the cups, glasses, and bowls you use. If you eat soup, measure how much of it is liquid and how much is solid (such as noodles, vegetables, meat). Conversions for Measuring Fluid Intake  Milliliters (mL) Liters (L) Ounces (oz) Cups (c)  1000 1 32 4  1200 1.2 40 5  1500 1.5 50 6 1/4  1800 1.8 60 7 1/2  2000 2 67 8 1/3    Tips to Reduce Your Thirst . Chew gum or suck on hard candy.  . Rinse or gargle with mouthwash. Do not swallow.  . Ice chips or popsicles my help quench thirst, but this too needs to be calculated into the total restriction. Melt ice chips or cubes first to figure out how much fluid they produce (for example, experiment with melting  cup ice chips or 2 ice cubes).  . Add a lemon wedge to your water.  . Limit how much salt you take in. A high salt intake might make you thirstier.  . Don't eat or drink all your allowed liquids at  once. Space your liquids out through the day.  . Use small glasses and cups and sip slowly. If allowed, take your medications with fluids you eat or drink during a meal.   Fluid-Restricted Nutrition Therapy Sample 1-Day Menu  Breakfast 1 slice wheat toast  1 tablespoon peanut butter  1/2 cup yogurt (120 milliliters)  1/2 cup blueberries  1 cup milk (240 milliliters)   Lunch 3 ounces sliced [place]  2 slices whole wheat bread  1/2 cup lettuce for sandwich  2 slices tomato for sandwich  1 ounce reduced-fat, reduced-sodium cheese  1/2 cup fresh carrot sticks  1 banana  1 cup unsweetened tea (240 milliliters)   Evening Meal 8 ounces soup (240 milliliters)  3 ounces salmon  1/2 cup quinoa  1 cup green beans  1 cup mixed greens salad  1 tablespoon olive oil  1 cup coffee (240 milliliters)  Evening Snack 1/2 cup sliced peaches  1/2 cup frozen yogurt (120 milliliters)  1 cup water (240 milliliters)  Copyright 2020  Academy of [employer]. All rights reserved  Roslyn Smiling, MS, RD, LDN Clinical dietitian Office phone [phone]

## 2020-07-29 NOTE — Progress Notes (Addendum)
Progress Note  Patient Name: Kristine Montgomery Date of Encounter: 07/29/2020  [facility] Cardiologist: Dietrich Pates, MD New  Subjective   No CP overnight, was put on O2 when sats dropped. Slept very well. Snores. Took Phentermine over a year ago and lost >100 lbs. Then husband died and she had knee surgery, gained it all back Has cut out sodas, hopes to lose wt again  Inpatient Medications    Scheduled Meds: . aspirin  324 mg Oral NOW   Or  . aspirin  300 mg Rectal NOW  . aspirin EC  81 mg Oral Daily  . etodolac  400 mg Oral BID  . metoprolol succinate  25 mg Oral Daily  . sodium chloride flush  3 mL Intravenous Q12H   Continuous Infusions: . sodium chloride    . sodium chloride    . sodium chloride 75 mL/hr at 07/29/20 0618  . heparin 1,600 Units/hr (07/29/20 0624)   PRN Meds: sodium chloride, sodium chloride, acetaminophen, HYDROcodone-acetaminophen, nitroGLYCERIN, ondansetron (ZOFRAN) IV, sodium chloride flush   Vital Signs    Vitals:   07/28/20 1645 07/28/20 1958 07/29/20 0002 07/29/20 0415  BP:  (!) 146/92 119/82 121/83  Pulse: 85 78 63 68  Resp: (!) 32 (!) 22 20 18   Temp:  97.9 F (36.6 C)  98.3 F (36.8 C)  TempSrc:  Oral  Oral  SpO2: 93% 90% 90% 94%  Weight:  124.6 kg  126.1 kg  Height:  5\' 1"  (1.549 m)      Intake/Output Summary (Last 24 hours) at 07/29/2020 0909 Last data filed at 07/29/2020 0626 Gross per 24 hour  Intake 499.22 ml  Output 300 ml  Net 199.22 ml   Last 3 Weights 07/29/2020 07/28/2020 07/28/2020  Weight (lbs) 278 lb 274 lb 12.8 oz 280 lb  Weight (kg) 126.1 kg 124.648 kg 127.007 kg      Telemetry    SR - Personally Reviewed  ECG    None today - Personally Reviewed  Physical Exam   GEN: No acute distress.   Neck: No JVD seen, difficult to assess 2nd body habitus Cardiac: RRR, no murmurs, rubs, or gallops.  Respiratory: decreased BS bases bilaterally. ->  With mild crackles/rales. GI: Soft, nontender, non-distended  MS: No  edema; No deformity. Neuro:  Nonfocal  Psych: Normal affect   Labs    High Sensitivity Troponin:   Recent Labs  Lab 07/28/20 1100 07/28/20 1248 07/28/20 1502  TROPONINIHS 211* 273* 390*      Chemistry Recent Labs  Lab 07/28/20 1100 07/29/20 0236  NA 137 139  K 4.0 3.8  CL 103 103  CO2 23 28  GLUCOSE 127* 109*  BUN 14 13  CREATININE 0.53 0.77  CALCIUM 8.9 9.1  PROT 8.2*  --   ALBUMIN 3.2*  --   AST 18  --   ALT 15  --   ALKPHOS 96  --   BILITOT 0.7  --   GFRNONAA >60 >60  ANIONGAP 11 8     Hematology Recent Labs  Lab 07/28/20 1100 07/29/20 0236  WBC 8.5 9.4  RBC 4.64 4.38  HGB 13.1 12.4  HCT 41.7 38.7  MCV 89.9 88.4  MCH 28.2 28.3  MCHC 31.4 32.0  RDW 15.1 15.2  PLT 305 305    BNP Recent Labs  Lab 07/28/20 1211  BNP 200.0*    Lab Results  Component Value Date   CHOL 211 (H) 07/29/2020   HDL 22 (L) 07/29/2020  LDLCALC 152 (H) 07/29/2020   TRIG 186 (H) 07/29/2020   CHOLHDL 9.6 07/29/2020   No results found for: TSH No results found for: HGBA1C   Radiology    DG Chest 2 View  Result Date: 07/28/2020 CLINICAL DATA:  Mid chest pain. EXAM: CHEST - 2 VIEW COMPARISON:  02/20/2018. FINDINGS: Trachea is midline. Heart is enlarged. Mild interstitial prominence and indistinctness, mid and lower lung zone predominant. No pleural fluid. Flowing anterior osteophytosis in the thoracic spine. IMPRESSION: Pulmonary edema. Electronically Signed   By: Leanna Battles M.D.   On: 07/28/2020 11:52   ECHOCARDIOGRAM COMPLETE  Result Date: 07/28/2020    ECHOCARDIOGRAM REPORT   Patient Name:   Kristine Montgomery Date of Exam: 07/28/2020 Medical Rec #:  [id number]      Height:       61.0 in Accession #:    [id number]     Weight:       280.0 lb Date of Birth:  05/12/1966      BSA:          2.179 m Patient Age:    55 years       BP:           148/60 mmHg Patient Gender: F              HR:           67 bpm. [facility] Procedure: 2D Echo, Cardiac Doppler and  Color Doppler Indications:    Elevated Troponin  History:        Patient has no prior history of Echocardiogram examinations. Hx                 of tobacco abuse, Obesity.  Sonographer:    Celesta Gentile RCS Referring Phys: [id number] PAULA V ROSS IMPRESSIONS  1. Poor acoustic windows limit study Definity used. Overall LVEF is approximately 30% with diffuse hypokinesis worse in the septal, mid/distal anterior, distal inferior, distal lateral and apical walls. . The left ventricular internal cavity size was mildly to moderately dilated. Left ventricular diastolic parameters are indeterminate.  2. Right ventricular systolic function is normal. The right ventricular size is normal.  3. Mild mitral valve regurgitation.  4. The aortic valve is tricuspid. Aortic valve regurgitation is not visualized. Mild aortic valve sclerosis is present, with no evidence of aortic valve stenosis.  5. The inferior vena cava is dilated in size with >50% respiratory variability, suggesting right atrial pressure of 8 mmHg. FINDINGS  Left Ventricle: Poor acoustic windows limit study Definity used. Overall LVEF is approximately 30% with diffuse hypokinesis worse in the septal, mid/distal anterior, distal inferior, distal lateral and apical walls. Left ventricular ejection fraction, by estimation, is 30%%. The left ventricle has severely decreased function. The left ventricle demonstrates global hypokinesis. Definity contrast agent was given IV to delineate the left ventricular endocardial borders. The left ventricular internal cavity size was mildly to moderately dilated. There is no left ventricular hypertrophy. Left ventricular diastolic parameters are consistent with Grade II diastolic dysfunction (pseudonormalization). Right Ventricle: The right ventricular size is normal. Right vetricular wall thickness was not assessed. Right ventricular systolic function is normal. Left Atrium: Left atrial size was normal in size. Right Atrium: Right atrial  size was normal in size. Pericardium: There is no evidence of pericardial effusion. Mitral Valve: The mitral valve is normal in structure. Mild mitral valve regurgitation. Tricuspid Valve: The tricuspid valve is grossly normal. Tricuspid valve regurgitation is mild. Aortic Valve:  The aortic valve is tricuspid. Aortic valve regurgitation is not visualized. Mild aortic valve sclerosis is present, with no evidence of aortic valve stenosis. Pulmonic Valve: The pulmonic valve was normal in structure. Pulmonic valve regurgitation is trivial. Aorta: The aortic root is normal in size and structure. Venous: The inferior vena cava is dilated in size with greater than 50% respiratory variability, suggesting right atrial pressure of 8 mmHg. IAS/Shunts: The interatrial septum was not assessed.  LEFT VENTRICLE PLAX 2D LVIDd:         5.40 cm  Diastology LVIDs:         4.50 cm  LV e' medial:    4.79 cm/s LV PW:         1.00 cm  LV E/e' medial:  26.3 LV IVS:        1.00 cm  LV e' lateral:   5.98 cm/s LVOT diam:     2.00 cm  LV E/e' lateral: 21.1 LV SV:         76 LV SV Index:   35 LVOT Area:     3.14 cm  RIGHT VENTRICLE RV S prime:     14.83 cm/s TAPSE (M-mode): 2.0 cm LEFT ATRIUM             Index       RIGHT ATRIUM           Index LA diam:        4.30 cm 1.97 cm/m  RA Area:     18.80 cm LA Vol (A2C):   48.6 ml 22.30 ml/m RA Volume:   48.10 ml  22.07 ml/m LA Vol (A4C):   68.2 ml 31.30 ml/m LA Biplane Vol: 57.7 ml 26.48 ml/m  AORTIC VALVE LVOT Vmax:   125.00 cm/s LVOT Vmean:  80.500 cm/s LVOT VTI:    0.243 m  AORTA Ao Root diam: 3.30 cm MITRAL VALVE MV Area (PHT): 4.33 cm     SHUNTS MV Decel Time: 175 msec     Systemic VTI:  0.24 m MV E velocity: 126.00 cm/s  Systemic Diam: 2.00 cm MV A velocity: 97.90 cm/s MV E/A ratio:  1.29 Dietrich Pates MD Electronically signed by Dietrich Pates MD Signature Date/Time: 07/28/2020/6:28:14 PM    Final     Cardiac Studies   CATH: Ordered  ECHO: 07/28/2020 1. Poor acoustic windows limit  study Definity used. Overall LVEF is  approximately 30% with diffuse hypokinesis worse in the septal, mid/distal  anterior, distal inferior, distal lateral and apical walls. . The left  ventricular internal cavity size was mildly to moderately dilated. Left ventricular diastolic parameters are indeterminate.  Left ventricular diastolic parameters are consistent with  Grade II diastolic dysfunction (pseudonormalization).  2. Right ventricular systolic function is normal. The right ventricular  size is normal.  3. Mild mitral valve regurgitation.  4. The aortic valve is tricuspid. Aortic valve regurgitation is not  visualized. Mild aortic valve sclerosis is present, with no evidence of  aortic valve stenosis.  5. The inferior vena cava is dilated in size with >50% respiratory  variability, suggesting right atrial pressure of 8 mmHg.   Patient Profile     55 y.o. female w/ hx obesity, tob use was admitted 03/14 with chest pain, cath planned.  Assessment & Plan    Principal Problem:   Unstable angina (HCC) Active Problems:   Acute combined systolic and diastolic CHF, NYHA class 4 (HCC)   Hyperlipidemia LDL goal <70  1. USAP/ACS -> versus demand  ischemia from CHF (acute on chronic combined) - for cath today, further evaluation and treatment depends on the results - continue ASA, BB, Heparin  2. CHF-> ~questionable duration, but clearly has Acute Combined Systolic and Diastolic Heart Failure --> need to determine if it is ischemic or nonischemic.  Borderline regional wall motion normalities but no true clear vessel distribution. Could be multivessel or potentially nonischemic - BNP not that high, but has CHF on CXR, O2 sats are decreased, and pt w/ SOB - give Lasix 40 mg, MD to determine if before cath -> may require additional diuretic afterwards. -> Will convert to right left heart cath based on reduced EF  3. HLD - continue high-intensity statin - nutrition consult  4.  Snorer, obesity - has sx c/w sleep apnea - will need eval as outpt  5. Cardiomyopathy - cath to determine type - on BB, add ARB or Entresto after cath We will plan right and left heart cath Hold NSAID      Signed, Theodore DemarkRhonda Barrett, PA-C  07/29/2020, 9:09 AM    ATTENDING ATTESTATION  I have seen, examined and evaluated the patient this morning along with Theodore Demarkhonda Barrett, PA.  After reviewing all the available data and chart, we discussed the patients laboratory, study & physical findings as well as symptoms in detail. I agree with her findings, examination as well as impression recommendations as per our discussion.    Attending adjustments noted in italics.   55 year old now morbidly obese woman presenting with symptoms concerning for ACS but also with significantly reduced EF by echo and regional wall motion abnormalities that are difficult to determine vessel distribution.  At this point with chest discomfort and new onset CHF (Acute Combined Systolic and Diastolic), we need to consider right and left heart cath and possible PCI.  Scheduled for today.  She is on heparin, along with aspirin and Toprol (appropriate given low EF) We will start high-dose statin, and stop NSAID as he will likely require DAPT if PCI occurs.  Anticipate initiating ARB versus Entresto prior to discharge. Would also probably benefit from SGLT2 inhibitor   We will know more once the R&LHC is performed.    Bryan Lemmaavid Harden Bramer, M.D., M.S. Interventional Cardiologist   Pager # [phone] Phone # [phone] [address] [place], [place] [address] \    For questions or updates, please contact [facility] Please consult [email or web address] for contact info under

## 2020-07-30 ENCOUNTER — Encounter: Payer: Self-pay | Admitting: Orthopedic Surgery

## 2020-07-30 ENCOUNTER — Ambulatory Visit: Payer: 59 | Admitting: Orthopedic Surgery

## 2020-07-30 ENCOUNTER — Encounter (HOSPITAL_COMMUNITY): Payer: Self-pay | Admitting: Cardiovascular Disease

## 2020-07-30 DIAGNOSIS — I255 Ischemic cardiomyopathy: Secondary | ICD-10-CM

## 2020-07-30 LAB — CBC
HCT: 36.3 % (ref 36.0–46.0)
Hemoglobin: 11.9 g/dL — ABNORMAL LOW (ref 12.0–15.0)
MCH: 28.5 pg (ref 26.0–34.0)
MCHC: 32.8 g/dL (ref 30.0–36.0)
MCV: 87.1 fL (ref 80.0–100.0)
Platelets: 280 10*3/uL (ref 150–400)
RBC: 4.17 MIL/uL (ref 3.87–5.11)
RDW: 15.2 % (ref 11.5–15.5)
WBC: 9.4 10*3/uL (ref 4.0–10.5)
nRBC: 0 % (ref 0.0–0.2)

## 2020-07-30 LAB — BASIC METABOLIC PANEL
Anion gap: 9 (ref 5–15)
BUN: 13 mg/dL (ref 6–20)
CO2: 25 mmol/L (ref 22–32)
Calcium: 8.8 mg/dL — ABNORMAL LOW (ref 8.9–10.3)
Chloride: 104 mmol/L (ref 98–111)
Creatinine, Ser: 0.72 mg/dL (ref 0.44–1.00)
GFR, Estimated: >60 mL/min (ref >60)
Glucose, Bld: 132 mg/dL — ABNORMAL HIGH (ref 70–99)
Potassium: 3.3 mmol/L — ABNORMAL LOW (ref 3.5–5.1)
Sodium: 138 mmol/L (ref 135–145)

## 2020-07-30 MED ORDER — POTASSIUM CHLORIDE CRYS ER 20 MEQ PO TBCR
30.0000 meq | EXTENDED_RELEASE_TABLET | ORAL | Status: AC
  Administered 2020-07-30 (×2): 30 meq via ORAL
  Filled 2020-07-30 (×2): qty 1

## 2020-07-30 MED ORDER — LOSARTAN POTASSIUM 25 MG PO TABS
12.5000 mg | ORAL_TABLET | Freq: Every day | ORAL | Status: DC
  Administered 2020-07-30: 12.5 mg via ORAL
  Filled 2020-07-30: qty 1

## 2020-07-30 MED FILL — Verapamil HCl IV Soln 2.5 MG/ML: INTRAVENOUS | Qty: 2 | Status: AC

## 2020-07-30 NOTE — Progress Notes (Signed)
CARDIAC REHAB PHASE I   PRE:  Rate/Rhythm: 71 SR  BP:  Supine:   Sitting: 126/90  Standing:    SaO2: 97%RA  MODE:  Ambulation: 200 ft   POST:  Rate/Rhythm: 97 SR  BP:  Supine:   Sitting: 133/63  Standing:    SaO2: 91%RA 1019-1115 Pt walked 200 ft on RA with rolling walker with slow steady gait. No CP. Sat in chair for me to replace pads since purewick had leaked. Assisted back to bed and called staff to replace purewick. Pt stated daughter will help her bathe later. Gave MI booklet and discussed restrictions. Reviewed NTG use, importance of brilinta with stents, heart healthy and low sodium food choices. Gave low sodium diet and encouraged 2000 mg restriction. Pt knows to weigh daily and when to call MD with weight gain. Discussed CRP 2 and referred to [place].  Did not give written ex ed as pt limited in mobility. Will encourage to walk as tolerated at home using cane. Pt has CHF packet from [facility].  [name], RN BSN  07/30/2020 11:10 AM

## 2020-07-30 NOTE — Progress Notes (Addendum)
Progress Note  Patient Name: Kristine Montgomery Date of Encounter: 07/30/2020  [facility] Cardiologist: Dietrich Pates, MD   Subjective   Doing well this morning. No chest pain overnight.   Inpatient Medications    Scheduled Meds: . (feeding supplement) PROSource Plus  30 mL Oral BID BM  . aspirin  81 mg Oral Daily  . atorvastatin  80 mg Oral Daily  . metoprolol succinate  25 mg Oral Daily  . sodium chloride flush  3 mL Intravenous Q12H  . ticagrelor  90 mg Oral BID   Continuous Infusions: . sodium chloride     PRN Meds: sodium chloride, acetaminophen, diazepam, HYDROcodone-acetaminophen, nitroGLYCERIN, ondansetron (ZOFRAN) IV, sodium chloride flush   Vital Signs    Vitals:   07/30/20 0024 07/30/20 0222 07/30/20 0422 07/30/20 0442  BP: (!) 144/94 123/72 134/83 115/78  Pulse: 66 (!) 54 (!) 58 67  Resp:      Temp:      TempSrc:    Oral  SpO2: 94% 95% 94% 95%  Weight:    124.4 kg  Height:        Intake/Output Summary (Last 24 hours) at 07/30/2020 0916 Last data filed at 07/30/2020 0445 Gross per 24 hour  Intake 733.93 ml  Output 1600 ml  Net -866.07 ml   Last 3 Weights 07/30/2020 07/29/2020 07/28/2020  Weight (lbs) 274 lb 4 oz 278 lb 274 lb 12.8 oz  Weight (kg) 124.4 kg 126.1 kg 124.648 kg      Telemetry    SB, PACs, PVCs - Personally Reviewed  ECG    NSR 67bpm, PAC, nonspecific changes - Personally Reviewed  Physical Exam   GEN: Obese female, sitting up in bed. No acute distress.   Neck: No JVD Cardiac: RRR, no murmurs, rubs, or gallops.  Respiratory: Expiratory wheezing. GI: Soft, nontender, non-distended  MS: No edema; No deformity. Right femoral cath site stable.  Neuro:  Nonfocal  Psych: Normal affect   Labs    High Sensitivity Troponin:   Recent Labs  Lab 07/28/20 1100 07/28/20 1248 07/28/20 1502  TROPONINIHS 211* 273* 390*      Chemistry Recent Labs  Lab 07/28/20 1100 07/29/20 0236 07/29/20 1436 07/29/20 1439 07/29/20 1440  07/30/20 0314  NA 137 139   < > 140 141 138  K 4.0 3.8   < > 4.0 4.0 3.3*  CL 103 103  --   --   --  104  CO2 23 28  --   --   --  25  GLUCOSE 127* 109*  --   --   --  132*  BUN 14 13  --   --   --  13  CREATININE 0.53 0.77  --   --   --  0.72  CALCIUM 8.9 9.1  --   --   --  8.8*  PROT 8.2*  --   --   --   --   --   ALBUMIN 3.2*  --   --   --   --   --   AST 18  --   --   --   --   --   ALT 15  --   --   --   --   --   ALKPHOS 96  --   --   --   --   --   BILITOT 0.7  --   --   --   --   --  GFRNONAA >60 >60  --   --   --  >60  ANIONGAP 11 8  --   --   --  9   < > = values in this interval not displayed.     Hematology Recent Labs  Lab 07/28/20 1100 07/29/20 0236 07/29/20 1436 07/29/20 1439 07/29/20 1440 07/30/20 0314  WBC 8.5 9.4  --   --   --  9.4  RBC 4.64 4.38  --   --   --  4.17  HGB 13.1 12.4   < > 13.3 13.3 11.9*  HCT 41.7 38.7   < > 39.0 39.0 36.3  MCV 89.9 88.4  --   --   --  87.1  MCH 28.2 28.3  --   --   --  28.5  MCHC 31.4 32.0  --   --   --  32.8  RDW 15.1 15.2  --   --   --  15.2  PLT 305 305  --   --   --  280   < > = values in this interval not displayed.    BNP Recent Labs  Lab 07/28/20 1211  BNP 200.0*     DDimer No results for input(s): DDIMER in the last 168 hours.   Radiology    DG Chest 2 View  Result Date: 07/28/2020 CLINICAL DATA:  Mid chest pain. EXAM: CHEST - 2 VIEW COMPARISON:  02/20/2018. FINDINGS: Trachea is midline. Heart is enlarged. Mild interstitial prominence and indistinctness, mid and lower lung zone predominant. No pleural fluid. Flowing anterior osteophytosis in the thoracic spine. IMPRESSION: Pulmonary edema. Electronically Signed   By: Leanna BattlesMelinda  Blietz M.D.   On: 07/28/2020 11:52   CARDIAC CATHETERIZATION  Result Date: 07/29/2020  Prox LAD lesion is 95% stenosed.  Ramus lesion is 40% stenosed.  Ost LAD lesion is 20% stenosed.  Ost Cx lesion is 30% stenosed.  Prox Cx lesion is 95% stenosed.  Prox RCA to Mid RCA  lesion is 100% stenosed.  Post intervention, there is a 0% residual stenosis.  Post intervention, there is a 0% residual stenosis.  A stent was successfully placed.  A stent was successfully placed.  Severe multivessel CAD with 30% smooth ostial narrowing of the LAD followed by focal 95% eccentric proximal stenosis; smooth 40% ostial narrowing of a ramus intermediate vessel; 30% ostial narrowing of the circumflex followed by 95% proximal stenosis on a bend in the proximal circumflex; and total Lee occluded proximal RCA with both bridging antegrade collaterals as well as retrograde collaterals supplying the distal RCA. Moderately elevated right heart pressures with very hypertension with a mean PA pressure at 41 mm. Successful two-vessel coronary intervention involving a 95% proximal LAD stenosis with insertion of a 3.5 x 15 mm Resolute Onyx stent postdilated to 3.75 mm and 95% proximal left circumflex stenosis with insertion of a 3.0 x 15 mm Resolute Onyx stent postdilated to 3.3 mm with the stenoses being reduced to 0%. RECOMMENDATION: DAPT with aspirin/Brilinta for minimum of 1 year.  Bivalirudin will be discontinued upon completion of the current bag with sheath pull several hours later.  Patient was given furosemide 40 mg IV at the completion of the procedure.  She will be started on carvedilol 3.125 mg twice a day and either ARB or Entresto tomorrow.  Hopefully LV function will improve with revascularization of both the LAD and circumflex vessels.   ECHOCARDIOGRAM COMPLETE  Result Date: 07/28/2020    ECHOCARDIOGRAM REPORT   Patient Name:  Kristine Montgomery Date of Exam: 07/28/2020 Medical Rec #:  [id number]      Height:       61.0 in Accession #:    [id number]     Weight:       280.0 lb Date of Birth:  January 20, 1966      BSA:          2.179 m Patient Age:    54 years       BP:           148/60 mmHg Patient Gender: F              HR:           67 bpm. [facility] Procedure: 2D Echo, Cardiac  Doppler and Color Doppler Indications:    Elevated Troponin  History:        Patient has no prior history of Echocardiogram examinations. Hx                 of tobacco abuse, Obesity.  Sonographer:    Celesta Gentile RCS Referring Phys: [id number] PAULA V ROSS IMPRESSIONS  1. Poor acoustic windows limit study Definity used. Overall LVEF is approximately 30% with diffuse hypokinesis worse in the septal, mid/distal anterior, distal inferior, distal lateral and apical walls. . The left ventricular internal cavity size was mildly to moderately dilated. Left ventricular diastolic parameters are indeterminate.  2. Right ventricular systolic function is normal. The right ventricular size is normal.  3. Mild mitral valve regurgitation.  4. The aortic valve is tricuspid. Aortic valve regurgitation is not visualized. Mild aortic valve sclerosis is present, with no evidence of aortic valve stenosis.  5. The inferior vena cava is dilated in size with >50% respiratory variability, suggesting right atrial pressure of 8 mmHg. FINDINGS  Left Ventricle: Poor acoustic windows limit study Definity used. Overall LVEF is approximately 30% with diffuse hypokinesis worse in the septal, mid/distal anterior, distal inferior, distal lateral and apical walls. Left ventricular ejection fraction, by estimation, is 30%%. The left ventricle has severely decreased function. The left ventricle demonstrates global hypokinesis. Definity contrast agent was given IV to delineate the left ventricular endocardial borders. The left ventricular internal cavity size was mildly to moderately dilated. There is no left ventricular hypertrophy. Left ventricular diastolic parameters are consistent with Grade II diastolic dysfunction (pseudonormalization). Right Ventricle: The right ventricular size is normal. Right vetricular wall thickness was not assessed. Right ventricular systolic function is normal. Left Atrium: Left atrial size was normal in size. Right Atrium:  Right atrial size was normal in size. Pericardium: There is no evidence of pericardial effusion. Mitral Valve: The mitral valve is normal in structure. Mild mitral valve regurgitation. Tricuspid Valve: The tricuspid valve is grossly normal. Tricuspid valve regurgitation is mild. Aortic Valve: The aortic valve is tricuspid. Aortic valve regurgitation is not visualized. Mild aortic valve sclerosis is present, with no evidence of aortic valve stenosis. Pulmonic Valve: The pulmonic valve was normal in structure. Pulmonic valve regurgitation is trivial. Aorta: The aortic root is normal in size and structure. Venous: The inferior vena cava is dilated in size with greater than 50% respiratory variability, suggesting right atrial pressure of 8 mmHg. IAS/Shunts: The interatrial septum was not assessed.  LEFT VENTRICLE PLAX 2D LVIDd:         5.40 cm  Diastology LVIDs:         4.50 cm  LV e' medial:    4.79 cm/s LV PW:  1.00 cm  LV E/e' medial:  26.3 LV IVS:        1.00 cm  LV e' lateral:   5.98 cm/s LVOT diam:     2.00 cm  LV E/e' lateral: 21.1 LV SV:         76 LV SV Index:   35 LVOT Area:     3.14 cm  RIGHT VENTRICLE RV S prime:     14.83 cm/s TAPSE (M-mode): 2.0 cm LEFT ATRIUM             Index       RIGHT ATRIUM           Index LA diam:        4.30 cm 1.97 cm/m  RA Area:     18.80 cm LA Vol (A2C):   48.6 ml 22.30 ml/m RA Volume:   48.10 ml  22.07 ml/m LA Vol (A4C):   68.2 ml 31.30 ml/m LA Biplane Vol: 57.7 ml 26.48 ml/m  AORTIC VALVE LVOT Vmax:   125.00 cm/s LVOT Vmean:  80.500 cm/s LVOT VTI:    0.243 m  AORTA Ao Root diam: 3.30 cm MITRAL VALVE MV Area (PHT): 4.33 cm     SHUNTS MV Decel Time: 175 msec     Systemic VTI:  0.24 m MV E velocity: 126.00 cm/s  Systemic Diam: 2.00 cm MV A velocity: 97.90 cm/s MV E/A ratio:  1.29 Dietrich Pates MD Electronically signed by Dietrich Pates MD Signature Date/Time: 07/28/2020/6:28:14 PM    Final     Cardiac Studies   Echo: 07/28/20: LVEF of 30% with diffuse HK.  Worsening  septal, mid distal anterior, distal inferior, distal lateral and apical walls.  Mild to moderate dilated LV.  Normal RV.  Mild MR.  Mild aortic valve sclerosis but no stenosis.  Borderline elevated RAP.  Cath-PCI: 07/29/20: Severe multivessel CAD with 30% smooth ostial narrowing of the LAD followed by focal 95% eccentric proximal stenosis; smooth 40% ostial narrowing of a ramus intermediate vessel; 30% ostial narrowing of the circumflex followed by 95% proximal stenosis on a bend in the proximal circumflex; and chronic total occlusion of proximal RCA with both bridging antegrade collaterals as well as retrograde collaterals supplying the distal RCA.   Ost LAD lesion is 20% stenosed.  Prox LAD lesion is 95% stenosed. =>   DES PCI (Resolute Onyx 3.5 mg 15 mm--3.7 mm).   Post intervention, there is a 0% residual stenosis.  Ramus lesion is 40% stenosed.  Ost Cx lesion is 30% stenosed.  Prox Cx lesion is 95% stenosed.    DES PCI (Resolute Onyx 3.0 mm x15 mm - 3.3 mm)  Post intervention, there is a 0% residual stenosis  Prox RCA to Mid RCA lesion is 100% stenosed.-CTO with both bridging and left to right collaterals  Moderately elevated right heart pressures with very hypertension with a mean PA pressure at 41 mm.   RECOMMENDATION: DAPT with aspirin/Brilinta for minimum of 1 year.  Bivalirudin will be discontinued upon completion of the current bag with sheath pull several hours later.  Patient was given furosemide 40 mg IV at the completion of the procedure.  She will be started on carvedilol 3.125 mg twice a day and either ARB or Entresto tomorrow.  Hopefully LV function will improve with revascularization of both the LAD and circumflex vessels.  Diagnostic      Intervention       Patient Profile     55 y.o. female w/ hx  obesity, tob use was admitted 03/14 with chest pain and underwent cardiac cath.   Assessment & Plan    1. NSTEMI/ MV CAD with Unstable Angina/CAD-PCI: hsTn peaked  at 390. Underwent cardiac cath noted above with severe multivessel CAD with focal 95% pLAD lesion treated with PCI/DESx1, along with 95% pLcx lesion with DESx1. Placed on DAPT with ASA/Brilinta.  -- CR to ambulate/educate this morning  Continue DAPT for 1 year.  Start on low-dose beta-blocker, titrate as tolerated.  High-dose statin  Medical management for occluded RCA.  Need to monitor ostial ramus and ostial circumflex lesions.   2. ICM-with acute combined systolic and diastolic heart failure: EF noted at 30% with diffuse hypokinesis worse in the septal, mid/distal anterior, inferior and lateral/apical walls. Given IV lasix post cath due to severely elevated PCWP and PA mean.  -- net - 666.8 -- has been started metoprolol succinate 25mg  daily, will add losartan 12.5mg  daily  If she tolerates low-dose losartan, will titrate up to 25 mg daily and consider the possibility of transchest transition for discharge).  Also will add spironolactone 12.5 mg in the morning.  Add oral Lasix in the morning. -- consider transition to Entresto if BP can tolerate prior to discharge, if not then outpatient  She is not diabetic, but would strongly consider the possibility of SGLT2 inhibitor (need for diuresis)-GLP-1 agonist (with obesity).  3. HLD: LDL 152 -- on atorvastatin 80mg  daily -- FLP/LFTs in 8 weeks  4. Tobacco use: cessation advised  5. Hypokalemia: 3.3, suppl   Morbid obesity-concern for OSA.  Concern for potential pickwickian physiology.  Will need outpatient Epworth scale evaluation, and reassessment of echocardiogram in 3 months to reassess EF but also to reassess artery pressures away from acute MI. -> [facility] nutrition counseling  For questions or updates, please contact [facility] Please consult [email or web address] for contact info under        Signed, Cornelius Moras, NP  07/30/2020, 9:16 AM     ATTENDING ATTESTATION  I have seen, examined and evaluated the patient this  PM along with Laverda Page, NP-C .  After reviewing all the available data and chart, we discussed the patients laboratory, study & physical findings as well as symptoms in detail. I agree with her findings, examination as well as impression recommendations as per our discussion.    Attending adjustments noted in italics.   Overall looks remarkably better today.  More awake and alert.  Does not appear to be in any distress.  Breathing has notably improved.  Not significant, but decent urine output following Lasix.  Her lungs are clear.  I would like to try to move forward toward hopefully discharge tomorrow, but her reduced EF needs immediate need to titrate medications.  Will increase losartan dose for tomorrow, add spironolactone.  If her pressures are somewhat elevated as they are this afternoon, may consider discharge on Entresto as opposed to losartan.  I would like to see how she does on the dose in the morning.  Need at least a cost estimate for Entresto-I would also evaluate for SGLT2 inhibitor.  With her obesity, she would benefit from GLP-1 agonist with the potential benefit of weight loss.  She is morbidly obese with hypertension and now CAD.  She probably does have some glucose intolerance with metabolic syndrome..  If she is clinically stable in the morning, would not necessarily keep her in the hospital 1 more day just to titrate medications that can be done as outpatient, but  the benefit of starting Entresto in the inpatient setting is that it is guaranteed that she is on it for extended period time.  With reduced EF, she is not having decompensated heart failure, no large infarct size, the wall motion abnormalities are relatively diffuse.  Probably not necessary to do consider LifeVest in the absence of any arrhythmias.  She will need close follow-up and consider echocardiogram in 3 months to reassess EF on aggressive medical management.    Bryan Lemma, M.D.,  M.S. Interventional Cardiologist   Pager # [phone] Phone # [phone] [address] [place], [place] [address]

## 2020-07-30 NOTE — Plan of Care (Signed)
  Problem: Activity: Goal: Ability to tolerate increased activity will improve Outcome: Progressing   Problem: Cardiac: Goal: Ability to achieve and maintain adequate cardiovascular perfusion will improve Outcome: Progressing   Problem: Health Behavior/Discharge Planning: Goal: Ability to safely manage health-related needs after discharge will improve Outcome: Progressing   Problem: Clinical Measurements: Goal: Ability to maintain clinical measurements within normal limits will improve Outcome: Progressing Goal: Will remain free from infection Outcome: Progressing Goal: Diagnostic test results will improve Outcome: Progressing   Problem: Nutrition: Goal: Adequate nutrition will be maintained Outcome: Progressing   Problem: Coping: Goal: Level of anxiety will decrease Outcome: Progressing   Problem: Elimination: Goal: Will not experience complications related to bowel motility Outcome: Progressing Goal: Will not experience complications related to urinary retention Outcome: Progressing   Problem: Pain Managment: Goal: General experience of comfort will improve Outcome: Progressing

## 2020-07-30 NOTE — TOC Benefit Eligibility Note (Signed)
Patient Product/process development scientist completed.    The patient is currently admitted and upon discharge could be taking Brilinta 90 mg.  The current 30 day co-pay is, $20.00.   The patient is insured through [employer] [facility]    Roland Earl, [facility] Patient Advocate Specialist [facility] Antimicrobial Stewardship Team Direct Number: [phone]  Fax: [phone]

## 2020-07-30 NOTE — Progress Notes (Addendum)
Heart Failure Nurse Navigator Progress Note  PCP: Elfredia Nevins, MD PCP-Cardiologist: Dietrich Pates, MD (NEW) Admission Diagnosis: NSTEMI Admitted from: home with family  Presentation:   Kristine Montgomery presented with chest pain and some SHOB. Pt had LHC 3/15 via R groin, PCI with 2 DES placed. Medication adjustments made inpatient, planned DC 3/17.   ECHO/ LVEF: 30%, G2DD this admission.   Clinical Course:  Past Medical History:  Diagnosis Date  . Acute combined systolic and diastolic CHF, NYHA class 4 (HCC) 07/29/2020  . Arthritis   . Asthma   . Chronic bronchitis (HCC)   . COPD (chronic obstructive pulmonary disease) (HCC)   . Edema extremities   . History of hiatal hernia   . Hyperlipidemia LDL goal <70 07/29/2020  . Ovarian cyst   . Pneumonia      Social History   Socioeconomic History  . Marital status: Significant Other    Spouse name: Not on file  . Number of children: 2  . Years of education: Not on file  . Highest education level: Not on file  Occupational History  . Not on file  Tobacco Use  . Smoking status: Current Every Day Smoker    Packs/day: 0.50    Years: 30.00    Pack years: 15.00    Types: Cigarettes  . Smokeless tobacco: Never Used  Vaping Use  . Vaping Use: Never used  Substance and Sexual Activity  . Alcohol use: Yes    Comment: occasional  . Drug use: No  . Sexual activity: Yes    Birth control/protection: Post-menopausal, Surgical    Comment: tubal  Other Topics Concern  . Not on file  Social History Narrative  . Not on file   Social Determinants of Health   Financial Resource Strain: Low Risk   . Difficulty of Paying Living Expenses: Not hard at all  Food Insecurity: No Food Insecurity  . Worried About Programme researcher, broadcasting/film/video in the Last Year: Never true  . Ran Out of Food in the Last Year: Never true  Transportation Needs: No Transportation Needs  . Lack of Transportation (Medical): No  . Lack of Transportation (Non-Medical): No   Physical Activity: Insufficiently Active  . Days of Exercise per Week: 1 day  . Minutes of Exercise per Session: 10 min  Stress: No Stress Concern Present  . Feeling of Stress : Not at all  Social Connections: Socially Isolated  . Frequency of Communication with Friends and Family: More than three times a week  . Frequency of Social Gatherings with Friends and Family: More than three times a week  . Attends Religious Services: Never  . Active Member of Clubs or Organizations: No  . Attends Banker Meetings: Never  . Marital Status: Widowed    High Risk Criteria for Readmission and/or Poor Patient Outcomes:  Heart failure hospital admissions (last 6 months): 1  No Show rate: 4%  Difficult social situation: no  Demonstrates medication adherence: yes  Primary Language: English  Literacy level: able to read/write and comprehend  Education Assessment and Provision:  Detailed education and instructions provided on heart failure disease management including the following:  Signs and symptoms of Heart Failure When to call the physician Importance of daily weights Low sodium diet Fluid restriction Medication management Anticipated future follow-up appointments  Patient education given on each of the above topics.  Patient acknowledges understanding via teach back method and acceptance of all instructions.  Education Materials:  "Living Better With  Heart Failure" Booklet, HF zone tool, & Daily Weight Tracker Tool.  Patient has scale at home: yes Patient has pill box at home: no, given from AHF clinic  Barriers of Care:   -recent smoker. (educated, encouraged continuing cessation at home) -diet and fluid intake. (educated)   Considerations/Referrals:   Referral made to Heart Failure Pharmacist Stewardship: yes, to see prior to DC/ HV TOC appt.  Referral made to Heart & Vascular TOC clinic: yes, scheduled for Tuesday 3/22 @ 2pm.   Confirmed pt has  transportation.   Items for Follow-up on DC/TOC: -medication optimization -medication compliance -increase daily activity (chronic knee pain, awaiting surgery per pt, uses cane to walk). -continue smoking cessation. -diet/fluid compliance  Ozella Rocks, RN, BSN Heart Failure Nurse Navigator [phone]

## 2020-07-30 NOTE — Progress Notes (Signed)
Heart Failure Stewardship Pharmacist Progress Note   PCP: Elfredia Nevins, MD PCP-Cardiologist: Dietrich Pates, MD    HPI:  55 yo F with PMH of obesity and tobacco use. She presented to the ED on 07/28/20 with chest pain. An ECHO was done on 07/28/20 and LVEF was reduced to 30%. R/LHC done on 07/29/20 - found to have severe multivessel disease s/p 2-vessel coronary intervention with DES and moderately elevated filling pressures (RA 11, PA 41, PCWP 27, CO 6, CI 2.8).   Current HF Medications: Metoprolol XL 25 mg daily Losartan 12.5 mg daily  Prior to admission HF Medications: None  Pertinent Lab Values: . Serum creatinine 0.72, BUN 13, Potassium 3.3, Sodium 138, BNP 200  Vital Signs: . Weight: 274 lbs (admission weight: 274 lbs) . Blood pressure: 130/70s  . Heart rate: 60-70s  Medication Assistance / Insurance Benefits Check: Does the patient have prescription insurance?  Yes Type of insurance plan: [employer] - commercial insurance  Outpatient Pharmacy:  Prior to admission outpatient pharmacy: [facility] Is the patient willing to use [facility] TOC pharmacy at discharge? Yes Is the patient willing to transition their outpatient pharmacy to utilize a [facility] outpatient pharmacy?   Pending    Assessment: 1. Acute systolic CHF (EF 97%), due to ischemic cardiomyopathy. NYHA class II symptoms. - No scheduled diuretics, moderately elevated filling pressures on cath but does not appear fluid overloaded on exam - Continue metoprolol XL 25 mg daily - Consider transitioning losartan to Entresto 24/26 mg BID pending BP  - Consider starting spironolactone prior to discharge if BP allows - Consider starting Farxiga 10 mg daily prior to discharge   Plan: 1) Medication changes recommended at this time: - Start Farxiga 10 mg daily  2) Patient assistance: Sherryll Burger copay $20 per month - Farxiga copay $15 per month - Can enroll in monthly copay cards to lower monthly copay for  Entresto to $10 per 1-3 month supply; and Farxiga to $0 per month  3)  Education  - To be completed prior to discharge  Sharen Hones, PharmD, BCPS Heart Failure Engineer, building services Phone [phone]

## 2020-07-31 ENCOUNTER — Telehealth: Payer: Self-pay | Admitting: Physician Assistant

## 2020-07-31 ENCOUNTER — Other Ambulatory Visit: Payer: Self-pay | Admitting: Physician Assistant

## 2020-07-31 DIAGNOSIS — I255 Ischemic cardiomyopathy: Secondary | ICD-10-CM | POA: Clinically undetermined

## 2020-07-31 DIAGNOSIS — I25110 Atherosclerotic heart disease of native coronary artery with unstable angina pectoris: Secondary | ICD-10-CM | POA: Diagnosis present

## 2020-07-31 DIAGNOSIS — G4736 Sleep related hypoventilation in conditions classified elsewhere: Secondary | ICD-10-CM

## 2020-07-31 DIAGNOSIS — E669 Obesity, unspecified: Secondary | ICD-10-CM | POA: Clinically undetermined

## 2020-07-31 DIAGNOSIS — Z720 Tobacco use: Secondary | ICD-10-CM | POA: Diagnosis present

## 2020-07-31 LAB — BASIC METABOLIC PANEL
Anion gap: 7 (ref 5–15)
BUN: 12 mg/dL (ref 6–20)
CO2: 26 mmol/L (ref 22–32)
Calcium: 9 mg/dL (ref 8.9–10.3)
Chloride: 105 mmol/L (ref 98–111)
Creatinine, Ser: 0.62 mg/dL (ref 0.44–1.00)
GFR, Estimated: >60 mL/min (ref >60)
Glucose, Bld: 111 mg/dL — ABNORMAL HIGH (ref 70–99)
Potassium: 3.8 mmol/L (ref 3.5–5.1)
Sodium: 138 mmol/L (ref 135–145)

## 2020-07-31 LAB — HEMOGLOBIN A1C
Hgb A1c MFr Bld: 6 % — ABNORMAL HIGH (ref 4.8–5.6)
Mean Plasma Glucose: 125.5 mg/dL

## 2020-07-31 LAB — CBC
HCT: 37.3 % (ref 36.0–46.0)
Hemoglobin: 11.7 g/dL — ABNORMAL LOW (ref 12.0–15.0)
MCH: 28 pg (ref 26.0–34.0)
MCHC: 31.4 g/dL (ref 30.0–36.0)
MCV: 89.2 fL (ref 80.0–100.0)
Platelets: 279 10*3/uL (ref 150–400)
RBC: 4.18 MIL/uL (ref 3.87–5.11)
RDW: 15.4 % (ref 11.5–15.5)
WBC: 9.7 10*3/uL (ref 4.0–10.5)
nRBC: 0 % (ref 0.0–0.2)

## 2020-07-31 MED ORDER — FUROSEMIDE 40 MG PO TABS
40.0000 mg | ORAL_TABLET | Freq: Every day | ORAL | Status: DC
  Administered 2020-07-31: 40 mg via ORAL
  Filled 2020-07-31: qty 1

## 2020-07-31 MED ORDER — ASPIRIN EC 81 MG PO TBEC: 81.0000 mg | DELAYED_RELEASE_TABLET | Freq: Every day | ORAL | 11 refills | Status: DC

## 2020-07-31 MED ORDER — SPIRONOLACTONE 25 MG PO TABS: 12.5000 mg | ORAL_TABLET | Freq: Every day | ORAL | 6 refills | Status: DC

## 2020-07-31 MED ORDER — SACUBITRIL-VALSARTAN 24-26 MG PO TABS: 1.0000 | ORAL_TABLET | Freq: Two times a day (BID) | ORAL | Status: DC

## 2020-07-31 MED ORDER — DAPAGLIFLOZIN PROPANEDIOL 10 MG PO TABS
10.0000 mg | ORAL_TABLET | Freq: Every day | ORAL | Status: DC
  Administered 2020-07-31: 10 mg via ORAL
  Filled 2020-07-31: qty 1

## 2020-07-31 MED ORDER — ATORVASTATIN CALCIUM 80 MG PO TABS: 80.0000 mg | ORAL_TABLET | Freq: Every day | ORAL | 6 refills | Status: DC

## 2020-07-31 MED ORDER — FUROSEMIDE 20 MG PO TABS: 20.0000 mg | ORAL_TABLET | Freq: Every day | ORAL | 6 refills | Status: DC

## 2020-07-31 MED ORDER — TICAGRELOR 90 MG PO TABS: 90.0000 mg | ORAL_TABLET | Freq: Two times a day (BID) | ORAL | 11 refills | Status: DC

## 2020-07-31 MED ORDER — SPIRONOLACTONE 12.5 MG HALF TABLET
12.5000 mg | ORAL_TABLET | Freq: Every day | ORAL | Status: DC
  Administered 2020-07-31: 12.5 mg via ORAL
  Filled 2020-07-31: qty 1

## 2020-07-31 MED ORDER — NITROGLYCERIN 0.4 MG SL SUBL: 0.4000 mg | SUBLINGUAL_TABLET | SUBLINGUAL | 12 refills | Status: DC | PRN

## 2020-07-31 MED ORDER — DAPAGLIFLOZIN PROPANEDIOL 10 MG PO TABS: 10.0000 mg | ORAL_TABLET | Freq: Every day | ORAL | 6 refills | Status: DC

## 2020-07-31 MED ORDER — SACUBITRIL-VALSARTAN 24-26 MG PO TABS: 1.0000 | ORAL_TABLET | Freq: Two times a day (BID) | ORAL | 11 refills | Status: DC

## 2020-07-31 MED ORDER — LOSARTAN POTASSIUM 25 MG PO TABS
25.0000 mg | ORAL_TABLET | Freq: Every day | ORAL | Status: DC
  Administered 2020-07-31: 25 mg via ORAL
  Filled 2020-07-31: qty 1

## 2020-07-31 MED ORDER — FUROSEMIDE 20 MG PO TABS: 20.0000 mg | ORAL_TABLET | Freq: Every day | ORAL | Status: DC

## 2020-07-31 MED ORDER — METOPROLOL SUCCINATE ER 25 MG PO TB24: 25.0000 mg | ORAL_TABLET | Freq: Every day | ORAL | 6 refills | Status: DC

## 2020-07-31 NOTE — Progress Notes (Signed)
CARDIAC REHAB PHASE I   PRE:  Rate/Rhythm: 64 SR  BP:  Supine: 143/92  Sitting:   Standing:    SaO2: 97%4L  95% 2L  93%RA  MODE:  Ambulation: 200 ft   POST:  Rate/Rhythm: 94 SR  BP:  Supine:   Sitting: 128/51  Standing:    SaO2: 89-91%RA 0900-0935 Pt was put on oxygen overnight. Pt stated she needs sleep study. Walked on RA and monitored sats. Pt at 89-91%RA and denied SOB. Stated walking limited by her knees that were hurting today. To bathroom and then to bed. Encouraged pt to weigh daily, take her meds, walk as tolerated, adhere to 2L FR and 2000 mg sodium restriction.    [name], RN BSN  07/31/2020 9:31 AM

## 2020-07-31 NOTE — Discharge Summary (Addendum)
Discharge Summary    Patient ID: Kristine Montgomery MRN: [id number]; DOB: 06/27/1965  Admit date: 07/28/2020 Discharge date: 07/31/2020  PCP:  Elfredia Nevins, MD   [facility]  Cardiologist:  Dietrich Pates, MD  (new)    Discharge Diagnoses    Principal Problem:   NSTEMI (non-ST elevated myocardial infarction) [facility]) Active Problems:   Unstable angina (HCC)   Acute combined systolic and diastolic CHF, NYHA class 4 (HCC)   Ischemic cardiomyopathy   Coronary artery disease involving native coronary artery of native heart with unstable angina pectoris (HCC)   Hyperlipidemia LDL goal <70   Nocturnal hypoxemia due to obesity   Morbid obesity (HCC)   Tobacco abuse   Diagnostic Studies/Procedures   Echo  07/28/20 1. Poor acoustic windows limit study Definity used. Overall LVEF is  approximately 30% with diffuse hypokinesis worse in the septal, mid/distal  anterior, distal inferior, distal lateral and apical walls. . The left  ventricular internal cavity size was  mildly to moderately dilated. Left ventricular diastolic parameters are  indeterminate.  2. Right ventricular systolic function is normal. The right ventricular  size is normal.  3. Mild mitral valve regurgitation.  4. The aortic valve is tricuspid. Aortic valve regurgitation is not  visualized. Mild aortic valve sclerosis is present, with no evidence of  aortic valve stenosis.  5. The inferior vena cava is dilated in size with >50% respiratory  variability, suggesting right atrial pressure of 8 mmHg.   CORONARY STENT INTERVENTION  07/29/20  RIGHT/LEFT HEART CATH AND CORONARY ANGIOGRAPHY    Conclusion    Prox LAD lesion is 95% stenosed.  Ramus lesion is 40% stenosed.  Ost LAD lesion is 20% stenosed.  Ost Cx lesion is 30% stenosed.  Prox Cx lesion is 95% stenosed.  Prox RCA to Mid RCA lesion is 100% stenosed.  Post intervention, there is a 0% residual stenosis.  Post  intervention, there is a 0% residual stenosis.  A stent was successfully placed.  A stent was successfully placed.   Severe multivessel CAD with 30% smooth ostial narrowing of the LAD followed by focal 95% eccentric proximal stenosis; smooth 40% ostial narrowing of a ramus intermediate vessel; 30% ostial narrowing of the circumflex followed by 95% proximal stenosis on a bend in the proximal circumflex; and total Lee occluded proximal RCA with both bridging antegrade collaterals as well as retrograde collaterals supplying the distal RCA.  Moderately elevated right heart pressures with very hypertension with a mean PA pressure at 41 mm.  Successful two-vessel coronary intervention involving a 95% proximal LAD stenosis with insertion of a 3.5 x 15 mm Resolute Onyx stent postdilated to 3.75 mm and 95% proximal left circumflex stenosis with insertion of a 3.0 x 15 mm Resolute Onyx stent postdilated to 3.3 mm with the stenoses being reduced to 0%.  RECOMMENDATION: DAPT with aspirin/Brilinta for minimum of 1 year.  Bivalirudin will be discontinued upon completion of the current bag with sheath pull several hours later.  Patient was given furosemide 40 mg IV at the completion of the procedure.  She will be started on carvedilol 3.125 mg twice a day and either ARB or Entresto tomorrow.  Hopefully LV function will improve with revascularization of both the LAD and circumflex vessels.   Diagnostic Dominance: Right    Intervention     History of Present Illness     Kristine Montgomery is a 55 y.o. female with history of tobacco abuse presented with chest pain and  shortness of breath and found to have elevated troponin.  Patient presented to [facility] with 2 days history of chest pain and shortness of breath.  Her symptoms was concerning for unstable angina.  Given cardiac risk factors and elevated troponin she was transferred to [facility] for further evaluation.  Hospital Course      Consultants: None  1. NSTEMI/ MV CAD with Unstable Angina/CAD-PCI:  - hsTn peaked at 390.  Treated with IV heparin. Underwent cardiac cath noted above with severe multivessel CAD with focal 95% pLAD lesion treated with PCI/DESx1, along with 95% pLcx lesion with DESx1. Placed on DAPT with ASA/Brilinta for 1 year.  Started on beta-blocker and high intensity statin.  Medical therapy for residual disease.  Lifestyle modification and tobacco cessation recommended  2. ICM-with acute combined systolic and diastolic heart failure: EF noted at 30% with diffuse hypokinesis worse in the septal, mid/distal anterior, inferior and lateral/apical walls. Given IV lasix post cath due to severely elevated PCWP and PA mean.  I & O -616 cc. -Started on Toprol-XL -Initially placed on losartan and transition to Entresto -Added Farxiga spironolactone -Patient will be follow-up in heart failure TOC clinic  3. HLD:  - 07/29/2020: Cholesterol 211; HDL 22; LDL Cholesterol 152; Triglycerides 186; VLDL 37 -Continue high intensity statin -FLP/LFTs in 8 weeks  4. Tobacco use: cessation advised  5. Hypokalemia: Resolved   6.  Morbid obesity/snoring/nocturnal hypoxia: -Patient required oxygen at night due to hypoxia.  She was hypoxic during ambulation with cardiac rehab as well.  Will set up home oxygen.  Order written for outpatient sleep study.  Did the patient have an acute coronary syndrome (MI, NSTEMI, STEMI, etc) this admission?:  Yes                               AHA/ACC Clinical Performance & Quality Measures: 1. Aspirin prescribed? - Yes 2. ADP Receptor Inhibitor (Plavix/Clopidogrel, Brilinta/Ticagrelor or Effient/Prasugrel) prescribed (includes medically managed patients)? - Yes 3. Beta Blocker prescribed? - Yes 4. High Intensity Statin (Lipitor 40-80mg  or Crestor 20-40mg ) prescribed? - Yes 5. EF assessed during THIS hospitalization? - Yes 6. For EF <40%, was ACEI/ARB prescribed? - Yes 7. For EF  <40%, Aldosterone Antagonist (Spironolactone or Eplerenone) prescribed? - Yes 8. Cardiac Rehab Phase II ordered (including medically managed patients)? - Yes    Discharge Vitals Blood pressure 122/81, pulse 70, temperature 98 F (36.7 C), temperature source Oral, resp. rate 17, height  (1.549 m), weight 129.2 kg, last menstrual period 06/27/2017, SpO2 97 %.  Filed Weights   07/29/20 0415 07/30/20 0442 07/31/20 0451  Weight: 126.1 kg 124.4 kg 129.2 kg   Physical Exam Constitutional:      Appearance: She is well-developed.  Cardiovascular:     Rate and Rhythm: Normal rate and regular rhythm.     Heart sounds: Normal heart sounds.  Pulmonary:     Effort: Pulmonary effort is normal.     Breath sounds: Normal breath sounds.  Abdominal:     Palpations: Abdomen is soft.  Musculoskeletal:        General: Normal range of motion.     Cervical back: Normal range of motion.  Skin:    General: Skin is warm and dry.  Neurological:     General: No focal deficit present.     Mental Status: She is alert and oriented to person, place, and time.  Psychiatric:  Mood and Affect: Mood normal.        Behavior: Behavior normal.    Labs & Radiologic Studies    CBC Recent Labs    07/30/20 0314 07/31/20 0224  WBC 9.4 9.7  HGB 11.9* 11.7*  HCT 36.3 37.3  MCV 87.1 89.2  PLT 280 279   Basic Metabolic Panel Recent Labs    62/83/15 0314 07/31/20 0224  NA 138 138  K 3.3* 3.8  CL 104 105  CO2 25 26  GLUCOSE 132* 111*  BUN 13 12  CREATININE 0.72 0.62  CALCIUM 8.8* 9.0  High Sensitivity Troponin:   Recent Labs  Lab 07/28/20 1100 07/28/20 1248 07/28/20 1502  TROPONINIHS 211* 273* 390*    Hemoglobin A1C Recent Labs    07/31/20 0224  HGBA1C 6.0*   Fasting Lipid Panel Recent Labs    07/29/20 0236  CHOL 211*  HDL 22*  LDLCALC 152*  TRIG 186*  CHOLHDL 9.6   _____________  DG Chest 2 View  Result Date: 07/28/2020 CLINICAL DATA:  Mid chest pain. EXAM: CHEST - 2  VIEW COMPARISON:  02/20/2018. FINDINGS: Trachea is midline. Heart is enlarged. Mild interstitial prominence and indistinctness, mid and lower lung zone predominant. No pleural fluid. Flowing anterior osteophytosis in the thoracic spine. IMPRESSION: Pulmonary edema. Electronically Signed   By: Leanna Battles M.D.   On: 07/28/2020 11:52   CARDIAC CATHETERIZATION  Result Date: 07/29/2020  Prox LAD lesion is 95% stenosed.  Ramus lesion is 40% stenosed.  Ost LAD lesion is 20% stenosed.  Ost Cx lesion is 30% stenosed.  Prox Cx lesion is 95% stenosed.  Prox RCA to Mid RCA lesion is 100% stenosed.  Post intervention, there is a 0% residual stenosis.  Post intervention, there is a 0% residual stenosis.  A stent was successfully placed.  A stent was successfully placed.  Severe multivessel CAD with 30% smooth ostial narrowing of the LAD followed by focal 95% eccentric proximal stenosis; smooth 40% ostial narrowing of a ramus intermediate vessel; 30% ostial narrowing of the circumflex followed by 95% proximal stenosis on a bend in the proximal circumflex; and total Lee occluded proximal RCA with both bridging antegrade collaterals as well as retrograde collaterals supplying the distal RCA. Moderately elevated right heart pressures with very hypertension with a mean PA pressure at 41 mm. Successful two-vessel coronary intervention involving a 95% proximal LAD stenosis with insertion of a 3.5 x 15 mm Resolute Onyx stent postdilated to 3.75 mm and 95% proximal left circumflex stenosis with insertion of a 3.0 x 15 mm Resolute Onyx stent postdilated to 3.3 mm with the stenoses being reduced to 0%. RECOMMENDATION: DAPT with aspirin/Brilinta for minimum of 1 year.  Bivalirudin will be discontinued upon completion of the current bag with sheath pull several hours later.  Patient was given furosemide 40 mg IV at the completion of the procedure.  She will be started on carvedilol 3.125 mg twice a day and either ARB or  Entresto tomorrow.  Hopefully LV function will improve with revascularization of both the LAD and circumflex vessels.   ECHOCARDIOGRAM COMPLETE  Result Date: 07/28/2020    ECHOCARDIOGRAM REPORT   Patient Name:   Kristine Montgomery Date of Exam: 07/28/2020 Medical Rec #:  [id number]      Height:       61.0 in Accession #:    [id number]     Weight:       280.0 lb Date of Birth:  December 19, 1965  BSA:          2.179 m Patient Age:    54 years       BP:           148/60 mmHg Patient Gender: F              HR:           67 bpm. [facility] Procedure: 2D Echo, Cardiac Doppler and Color Doppler Indications:    Elevated Troponin  History:        Patient has no prior history of Echocardiogram examinations. Hx                 of tobacco abuse, Obesity.  Sonographer:    Celesta GentileBernard White RCS Referring Phys: [id number] PAULA V ROSS IMPRESSIONS  1. Poor acoustic windows limit study Definity used. Overall LVEF is approximately 30% with diffuse hypokinesis worse in the septal, mid/distal anterior, distal inferior, distal lateral and apical walls. . The left ventricular internal cavity size was mildly to moderately dilated. Left ventricular diastolic parameters are indeterminate.  2. Right ventricular systolic function is normal. The right ventricular size is normal.  3. Mild mitral valve regurgitation.  4. The aortic valve is tricuspid. Aortic valve regurgitation is not visualized. Mild aortic valve sclerosis is present, with no evidence of aortic valve stenosis.  5. The inferior vena cava is dilated in size with >50% respiratory variability, suggesting right atrial pressure of 8 mmHg. FINDINGS  Left Ventricle: Poor acoustic windows limit study Definity used. Overall LVEF is approximately 30% with diffuse hypokinesis worse in the septal, mid/distal anterior, distal inferior, distal lateral and apical walls. Left ventricular ejection fraction, by estimation, is 30%%. The left ventricle has severely decreased function. The left  ventricle demonstrates global hypokinesis. Definity contrast agent was given IV to delineate the left ventricular endocardial borders. The left ventricular internal cavity size was mildly to moderately dilated. There is no left ventricular hypertrophy. Left ventricular diastolic parameters are consistent with Grade II diastolic dysfunction (pseudonormalization). Right Ventricle: The right ventricular size is normal. Right vetricular wall thickness was not assessed. Right ventricular systolic function is normal. Left Atrium: Left atrial size was normal in size. Right Atrium: Right atrial size was normal in size. Pericardium: There is no evidence of pericardial effusion. Mitral Valve: The mitral valve is normal in structure. Mild mitral valve regurgitation. Tricuspid Valve: The tricuspid valve is grossly normal. Tricuspid valve regurgitation is mild. Aortic Valve: The aortic valve is tricuspid. Aortic valve regurgitation is not visualized. Mild aortic valve sclerosis is present, with no evidence of aortic valve stenosis. Pulmonic Valve: The pulmonic valve was normal in structure. Pulmonic valve regurgitation is trivial. Aorta: The aortic root is normal in size and structure. Venous: The inferior vena cava is dilated in size with greater than 50% respiratory variability, suggesting right atrial pressure of 8 mmHg. IAS/Shunts: The interatrial septum was not assessed.  LEFT VENTRICLE PLAX 2D LVIDd:         5.40 cm  Diastology LVIDs:         4.50 cm  LV e' medial:    4.79 cm/s LV PW:         1.00 cm  LV E/e' medial:  26.3 LV IVS:        1.00 cm  LV e' lateral:   5.98 cm/s LVOT diam:     2.00 cm  LV E/e' lateral: 21.1 LV SV:  76 LV SV Index:   35 LVOT Area:     3.14 cm  RIGHT VENTRICLE RV S prime:     14.83 cm/s TAPSE (M-mode): 2.0 cm LEFT ATRIUM             Index       RIGHT ATRIUM           Index LA diam:        4.30 cm 1.97 cm/m  RA Area:     18.80 cm LA Vol (A2C):   48.6 ml 22.30 ml/m RA Volume:   48.10 ml   22.07 ml/m LA Vol (A4C):   68.2 ml 31.30 ml/m LA Biplane Vol: 57.7 ml 26.48 ml/m  AORTIC VALVE LVOT Vmax:   125.00 cm/s LVOT Vmean:  80.500 cm/s LVOT VTI:    0.243 m  AORTA Ao Root diam: 3.30 cm MITRAL VALVE MV Area (PHT): 4.33 cm     SHUNTS MV Decel Time: 175 msec     Systemic VTI:  0.24 m MV E velocity: 126.00 cm/s  Systemic Diam: 2.00 cm MV A velocity: 97.90 cm/s MV E/A ratio:  1.29 Dietrich Pates MD Electronically signed by Dietrich Pates MD Signature Date/Time: 07/28/2020/6:28:14 PM    Final    Disposition   Pt is being discharged home today in good condition.  Follow-up Plans & Appointments     Follow-up Information    [facility]. Go on 08/05/2020.   Specialty: Cardiology Why: AT 2PM. Heart Impact (HV TOC) [facility]. FREE valet parking at [employer] [address] ([address] Bring all medications and pill box/organizer with you.  Contact information: [address] [id number] mc [address] [place] [address] [phone]       [facility], [place]. Go on 08/20/2020.   Specialty: Cardiology Why: :15am for hospital follow up. Please arrive 15 minutes early  Contact information: [address] [place] [address] [phone]              Discharge Instructions    Amb Referral to Cardiac Rehabilitation   Complete by: As directed    Diagnosis:  NSTEMI Coronary Stents     After initial evaluation and assessments completed: Virtual Based Care may be provided alone or in conjunction with Phase 2 Cardiac Rehab based on patient barriers.: Yes   Diet - low sodium heart healthy   Complete by: As directed    Discharge instructions   Complete by: As directed    NO HEAVY LIFTING (>10lbs) X 2 WEEKS. NO SEXUAL ACTIVITY X 2 WEEKS. NO DRIVING X 1 WEEK. NO SOAKING BATHS, HOT TUBS, POOLS, ETC., X 7 DAYS.      Weigh yourself EVERY morning after you go to the bathroom but before you eat or drink  anything. Write this number down in a weight log/diary. If you gain 3 pounds overnight or 5 pounds in a week, call the office. Take your medicines as prescribed. If you have concerns about your medications, please call us before you stop taking them.  Eat low salt foods-Limit salt (sodium) to 2000 mg per day. This will help prevent your body from holding onto fluid. Read food labels as many processed foods have a lot of sodium, especially canned goods and prepackaged meats. If you would like some assistance choosing low sodium foods, we would be happy to set you up with a nutritionist. Stay as active as you can everyday. Staying active will give  you more energy and make your muscles stronger. Start with 5 minutes at a time and work your way up to 30 minutes a day. Break up your activities--do some in the morning and some in the afternoon. Start with 3 days per week and work your way up to 5 days as you can. If you have chest pain, feel short of breath, dizzy, or lightheaded, STOP. If you don't feel better after a short rest, call 911. If you do feel better, call the office to let us know you have symptoms with exercise. Limit all fluids for the day to less than 2 liters. Fluid includes all drinks, coffee, juice, ice chips, soup, jello, and all other liquids.   Increase activity slowly   Complete by: As directed    Polysomnography 4 or more parameters   Complete by: Aug 07, 2020    Needs outpatient sleep study for nocturnal hypoxia, morbid obesity and snoring   Where should this test be performed: [facility]      Discharge Medications   Allergies as of 07/31/2020   No Known Allergies     Medication List    STOP taking these medications   etodolac 400 MG tablet Commonly known as: LODINE   HYDROcodone-acetaminophen 10-325 MG tablet Commonly known as: NORCO   promethazine 12.5 MG tablet Commonly known as: PHENERGAN     TAKE these medications   aspirin EC 81 MG  tablet Take 1 tablet (81 mg total) by mouth daily. Swallow whole.   atorvastatin 80 MG tablet Commonly known as: LIPITOR Take 1 tablet (80 mg total) by mouth daily. Start taking on: August 01, 2020   dapagliflozin propanediol 10 MG Tabs tablet Commonly known as: FARXIGA Take 1 tablet (10 mg total) by mouth daily. Start taking on: August 01, 2020   furosemide 20 MG tablet Commonly known as: LASIX Take 1 tablet (20 mg total) by mouth daily. Take an additional 1 tablet for 3 pound weight gain overnight or 5 pounds in 1 week Start taking on: August 01, 2020   metoprolol succinate 25 MG 24 hr tablet Commonly known as: TOPROL-XL Take 1 tablet (25 mg total) by mouth daily. Start taking on: August 01, 2020   nitroGLYCERIN 0.4 MG SL tablet Commonly known as: NITROSTAT Place 1 tablet (0.4 mg total) under the tongue every 5 (five) minutes x 3 doses as needed for chest pain.   sacubitril-valsartan 24-26 MG Commonly known as: ENTRESTO Take 1 tablet by mouth 2 (two) times daily.   spironolactone 25 MG tablet Commonly known as: ALDACTONE Take 0.5 tablets (12.5 mg total) by mouth daily. Start taking on: August 01, 2020   ticagrelor 90 MG Tabs tablet Commonly known as: BRILINTA Take 1 tablet (90 mg total) by mouth 2 (two) times daily.            Durable Medical Equipment  (From admission, onward)         Start     Ordered   07/31/20 1431  For home use only DME oxygen  Once       Question Answer Comment  Length of Need 6 Months   Mode or (Route) Nasal cannula   Liters per Minute 2   Oxygen delivery system Gas      07/31/20 1430             Outstanding Labs/Studies   BMET at heart failure impact clinic follow-up LFTs and lipid panel in 6 to 8 weeks  Duration  of Discharge Encounter   Greater than 30 minutes including physician time.  Vonzella Nipple Morley, PA 07/31/2020, 1:18 PM

## 2020-07-31 NOTE — Progress Notes (Signed)
Heart Failure Patient Advocate Encounter  Was successful in obtaining a copay card for [facility].  This copay card will make the patients copay $10 per 1-3 month supply.  The billing information is as follows and has been shared with the patients pharmacy.   RxBin: [id number] PCN: [id number] Member ID: [id number] Group ID: [id number]   Was successful in obtaining a copay card for Farxiga.  This copay card will make the patients copay $0 per month.  The billing information is as follows and has been shared with the patients pharmacy.   RxBin: [id number] PCN: CN Member ID: [id number] Group ID: [id number]   Was successful in obtaining a copay card for Brilinta.  This copay card will make the patients copay $5 per month.  The billing information is as follows and has been shared with the patients pharmacy.   RxBin: [id number] PCN: CN Member ID: [id number] Group ID: [id number]  Sharen Hones, PharmD, BCPS Heart Failure Stewardship Pharmacist Phone [phone]  Please check [email or web address] for unit-specific pharmacist phone numbers

## 2020-07-31 NOTE — Progress Notes (Addendum)
SATURATION QUALIFICATIONS: (This note is used to comply with regulatory documentation for home oxygen)  Patient Saturations on Room Air at Rest = 97%  Patient Saturations on Room Air while Ambulating = 89 %  Patient Saturations on 2 Liters of oxygen while Ambulating = 95%  Please briefly explain why patient needs home oxygen:Pt SpO2 dropped to 89 % while ambulating. Pt is hypoxic when she sleeps and she neds 2L of O2. Sleep study pending.

## 2020-07-31 NOTE — Progress Notes (Signed)
Heart Failure Navigator Progress Note  TOC pharmacy called HF steward stating pt unable to make co-pay of $15 for meds delivered to bedside. Navigator called patient as previous interview pt stated no issue with finances, pending disability (~$1200/mo per pt) to start in April, pt does not pay rent as she has a room at her father's house and also at her daughter' house. Pt states she drives and does not have an issue with transportation.   Pt states she can pay for her medications, just didn't have any way to pay currently in her hospital room. Confirmed HV TOC appt with patient prior to ending call.   Ozella Rocks, RN, BSN Heart Failure Nurse Navigator [phone]

## 2020-07-31 NOTE — Telephone Encounter (Signed)
TOC Patient-  Please call Patient-  Pt have an appt on 08-20-20 with Vin

## 2020-07-31 NOTE — Progress Notes (Signed)
Heart Failure Stewardship Pharmacist Progress Note   PCP: Elfredia Nevins, MD PCP-Cardiologist: Dietrich Pates, MD    HPI:  55 yo F with PMH of obesity and tobacco use. She presented to the ED on 07/28/20 with chest pain. An ECHO was done on 07/28/20 and LVEF was reduced to 30%. R/LHC done on 07/29/20 - found to have severe multivessel disease s/p 2-vessel coronary intervention with DES and moderately elevated filling pressures (RA 11, PA 41, PCWP 27, CO 6, CI 2.8).   Current HF Medications: Furosemide 20 mg daily Metoprolol XL 25 mg daily Entresto 24/26 mg BID Spironolactone 12.5 mg daily Farxiga 10 mg daily  Prior to admission HF Medications: None  Pertinent Lab Values: . Serum creatinine 0.62, BUN 12, Potassium 3.8, Sodium 138, BNP 200  Vital Signs: . Weight: 284 lbs (admission weight: 274 lbs) . Blood pressure: 140/70s  . Heart rate: 60-70s  Medication Assistance / Insurance Benefits Check: Does the patient have prescription insurance?  Yes Type of insurance plan: [employer] - commercial insurance  Outpatient Pharmacy:  Prior to admission outpatient pharmacy: [facility] Is the patient willing to use [facility] TOC pharmacy at discharge? Yes Is the patient willing to transition their outpatient pharmacy to utilize a [facility] outpatient pharmacy?   Pending    Assessment: 1. Acute systolic CHF (EF 89%), due to ischemic cardiomyopathy. NYHA class II symptoms. - No scheduled diuretics, moderately elevated filling pressures on cath but does not appear fluid overloaded on exam - Continue metoprolol XL 25 mg daily - Agree with transitioning to Entresto 24/26 mg BID - Agree with starting spironolactone 12.5 mg daily - Agree with starting Farxiga 10 mg daily  Plan: 1) Medication changes recommended at this time: - Agree with changes as above  2) Patient assistance: - Entresto copay $20 per month - Farxiga copay $15 per month - Brilinta copay $20 per month - Will  enroll in monthly copay cards to lower monthly copay for Entresto to $10 per 1-3 month supply, Farxiga to $0 per month, and Brilinta to $5 per month  3)  Education  - Patient has been educated on current HF medications and potential additions to HF medication regimen  - Patient verbalizes understanding that over the next few months, these medication doses may change and more medications may be added to optimize HF regimen - Patient has been educated on basic disease state pathophysiology and goals of therapy - Time spent 30 mins   Sharen Hones, PharmD, BCPS Heart Failure Engineer, building services Phone [phone]

## 2020-07-31 NOTE — TOC Initial Note (Addendum)
Transition of Care [facility]) - Initial/Assessment Note    Patient Details  Name: Kristine Montgomery MRN: [id number] Date of Birth: [birth year]  Transition of Care [facility]) CM/SW Contact:    Gala Lewandowsky, RN Phone Number: 07/31/2020, 2:36 PM  Clinical Narrative:  Patient presented for unstable angina. Plan will be for the patient to return home with durable medical equipment oxygen. Patient is agreeable to use [employer]. Referral made to [employer] and portable tank to be delivered to the room prior to transition home. No home health needs identified at this time.                 1625 07-31-20 Case Manager was alerted that the sats did not drop below 88% and would not qualify for home oxygen. Case Manager asked the nursing staff to ambulate the patient again and sats did not go below 88%- only went to 89% with ambulation- pt will not qualify for home oxygen. Patient will have to follow up outpatient for sleep study and oxygen needs. Patient will not leave with oxygen for home today. [employer] will follow outpatient and assist with oxygen needs.    Expected Discharge Plan: Home/Self Care Barriers to Discharge: No Barriers Identified   Patient Goals and CMS Choice Patient states their goals for this hospitalization and ongoing recovery are:: to return home   Choice offered to / list presented to : NA  Expected Discharge Plan and Services Expected Discharge Plan: Home/Self Care In-house Referral: NA Discharge Planning Services: CM Consult Post Acute Care Choice: NA Living arrangements for the past 2 months: Mobile Home Expected Discharge Date: 07/31/20               DME Arranged: Oxygen DME Agency: [employer] Date DME Agency Contacted: 07/31/20 Time DME Agency Contacted: 1406 Representative spoke with at DME Agency: Velna Hatchet HH Arranged: NA     Prior Living Arrangements/Services Living arrangements for the past 2 months: Mobile Home Lives with:: Self,Relatives Patient language and need for  interpreter reviewed:: Yes Do you feel safe going back to the place where you live?: Yes      Need for Family Participation in Patient Care: Yes (Comment) Care giver support system in place?: Yes (comment)   Criminal Activity/Legal Involvement Pertinent to Current Situation/Hospitalization: No - Comment as needed  Activities of Daily Living Home Assistive Devices/Equipment: Cane (specify quad or straight),Shower chair with back,Walker (specify type) ADL Screening (condition at time of admission) Patient's cognitive ability adequate to safely complete daily activities?: Yes Is the patient deaf or have difficulty hearing?: No Does the patient have difficulty seeing, even when wearing glasses/contacts?: No Does the patient have difficulty concentrating, remembering, or making decisions?: No Patient able to express need for assistance with ADLs?: Yes Does the patient have difficulty dressing or bathing?: No Independently performs ADLs?: Yes (appropriate for developmental age) Does the patient have difficulty walking or climbing stairs?: Yes (uses cane) Weakness of Legs: Both (needs knee surgery) Weakness of Arms/Hands: Both (arthritis in both hands)  Permission Sought/Granted Permission sought to share information with : Family Dealer granted to share info w AGENCY: [employer]        Emotional Assessment Appearance:: Appears stated age Attitude/Demeanor/Rapport: Engaged Affect (typically observed): Appropriate Orientation: : Oriented to Situation,Oriented to Place,Oriented to Self,Oriented to  Time Alcohol / Substance Use: Not Applicable Psych Involvement: No (comment)  Admission diagnosis:  Unstable angina (HCC) [I20.0] Elevated troponin [R77.8] NSTEMI (non-ST elevated myocardial  infarction) [facility]) [I21.4] Patient Active Problem List   Diagnosis Date Noted  . Acute combined systolic and diastolic CHF, NYHA class 4 (HCC)  07/29/2020  . Hyperlipidemia LDL goal <70 07/29/2020  . NSTEMI (non-ST elevated myocardial infarction) (HCC) 07/29/2020  . Unstable angina (HCC) 07/28/2020  . S/P right knee arthroscopy 05/23/20 05/28/2020  . Tear of meniscus of knee joint   . Synovitis of knee   . Primary osteoarthritis of right knee   . Encounter for screening fecal occult blood testing 04/09/2020  . Encounter for gynecological examination with Papanicolaou smear of cervix 04/09/2020  . Urinary tract infection without hematuria 02/27/2020  . Dysuria 02/27/2020  . Urge incontinence 04/10/2018  . OAB (overactive bladder) 04/10/2018  . Urinary frequency 04/10/2018  . 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 [place] [address] Phone: [phone] Fax: [phone]     Social Determinants of Health (SDOH) Interventions Financial Strain Interventions: Intervention Not Indicated Social Connections Interventions: Intervention Not Indicated  Readmission Risk Interventions No flowsheet data found.

## 2020-08-01 NOTE — Telephone Encounter (Signed)
**Note De-Identified  Obfuscation** Patient contacted regarding discharge from [facility] on 07/31/2020.  Patient states that she requested to be scheduled at our [place] office as she lives very close to that location. She is requesting to have this f/u re-scheduled. She is aware that I am forwarding this phone note to our scheduling Dept and that they will be contacting the her to re-schedule her post hospital appt.   Patient understands discharge instructions? Yes Patient understands medications and regiment? Yes Patient understands to bring all medications to this visit? Yes  Ask patient:  Are you enrolled in My Chart: Yes  The pt c/o fatigue but states that otherwise she feels good. She denies, CP/discomfort, SOB, weight gain, nausea, diaphoresis, dizziness, lightheadedness, or headaches. She is aware to contact [facility] at [phone] if she has any questions or concerns.

## 2020-08-04 NOTE — Telephone Encounter (Signed)
Patient scheduled for 04/01 at 3:30pm with Randall An in [facility] for [facility] f/u.

## 2020-08-05 ENCOUNTER — Ambulatory Visit (HOSPITAL_COMMUNITY)
Admit: 2020-08-05 | Discharge: 2020-08-05 | Disposition: A | Discharge status: Home / Self Care | Payer: 59 | Source: Ambulatory Visit | Attending: Internal Medicine | Admitting: Internal Medicine

## 2020-08-05 ENCOUNTER — Telehealth (HOSPITAL_COMMUNITY): Payer: Self-pay

## 2020-08-05 ENCOUNTER — Other Ambulatory Visit: Payer: Self-pay

## 2020-08-05 ENCOUNTER — Encounter (HOSPITAL_COMMUNITY): Payer: Self-pay

## 2020-08-05 VITALS — BP 110/78 | HR 75 | Wt 263.6 lb

## 2020-08-05 DIAGNOSIS — Z87891 Personal history of nicotine dependence: Secondary | ICD-10-CM | POA: Diagnosis not present

## 2020-08-05 DIAGNOSIS — I2510 Atherosclerotic heart disease of native coronary artery without angina pectoris: Secondary | ICD-10-CM | POA: Insufficient documentation

## 2020-08-05 DIAGNOSIS — Z79899 Other long term (current) drug therapy: Secondary | ICD-10-CM | POA: Insufficient documentation

## 2020-08-05 DIAGNOSIS — Z955 Presence of coronary angioplasty implant and graft: Secondary | ICD-10-CM | POA: Diagnosis not present

## 2020-08-05 DIAGNOSIS — F17200 Nicotine dependence, unspecified, uncomplicated: Secondary | ICD-10-CM | POA: Diagnosis not present

## 2020-08-05 DIAGNOSIS — Z8249 Family history of ischemic heart disease and other diseases of the circulatory system: Secondary | ICD-10-CM | POA: Diagnosis not present

## 2020-08-05 DIAGNOSIS — I5042 Chronic combined systolic (congestive) and diastolic (congestive) heart failure: Secondary | ICD-10-CM | POA: Insufficient documentation

## 2020-08-05 DIAGNOSIS — I255 Ischemic cardiomyopathy: Secondary | ICD-10-CM | POA: Insufficient documentation

## 2020-08-05 DIAGNOSIS — Z7984 Long term (current) use of oral hypoglycemic drugs: Secondary | ICD-10-CM | POA: Insufficient documentation

## 2020-08-05 DIAGNOSIS — Z7982 Long term (current) use of aspirin: Secondary | ICD-10-CM | POA: Diagnosis not present

## 2020-08-05 LAB — BASIC METABOLIC PANEL
Anion gap: 10 (ref 5–15)
BUN: 18 mg/dL (ref 6–20)
CO2: 20 mmol/L — ABNORMAL LOW (ref 22–32)
Calcium: 9.3 mg/dL (ref 8.9–10.3)
Chloride: 102 mmol/L (ref 98–111)
Creatinine, Ser: 0.83 mg/dL (ref 0.44–1.00)
GFR, Estimated: >60 mL/min (ref >60)
Glucose, Bld: 126 mg/dL — ABNORMAL HIGH (ref 70–99)
Potassium: 3.4 mmol/L — ABNORMAL LOW (ref 3.5–5.1)
Sodium: 132 mmol/L — ABNORMAL LOW (ref 135–145)

## 2020-08-05 NOTE — Patient Instructions (Signed)
Labs done today. We will contact you only if your labs are abnormal.  No medication changes were made. Please continue all current medications as prescribed.  Keep your pending appointment with [facility] in [place]

## 2020-08-05 NOTE — Progress Notes (Signed)
Heart and Vascular Center Transitions of Care Clinic Heart Failure Pharmacist Encounter  HPI:  55 yo F with PMH of obesity and tobacco use. She presented to the ED on 07/28/20 with chest pain. An ECHO was done on 07/28/20 and LVEF was reduced to 30%. R/LHC done on 07/29/20 - found to have severe multivessel disease s/p 2-vessel coronary intervention with DES and moderately elevated filling pressures (RA 11, PA 41, PCWP 27, CO 6, CI 2.8). She was then discharged on 07/31/20.   Today, Kristine Montgomery presents to the [facility] for follow up. She reports feeling well since being discharged from the hospital. She has been mistakenly taking her spironolactone as a full tablet (25 mg) instead of half a tablet for the last few days. This morning, she did take just half a tablet. BP during the visit today was 110/78. No other questions or complaints at this time.   HF Medications: Furosemide 20 mg daily Metoprolol XL 25 mg daily Entresto 24/26 mg BID Spironolactone 25 mg daily (taking 25 mg instead of 12.5 mg) Farxiga 10 mg daily  Has the patient been experiencing any side effects to the medications prescribed?  no  Does the patient have any problems obtaining medications due to transportation or finances?   No - currently has Nurse, learning disability but will soon be receiving Medicaid   Understanding of regimen: good Understanding of indications: good Potential of compliance: fair Patient understands to avoid NSAIDs. Patient understands to avoid decongestants.   Pertinent Lab Values: . Serum creatinine 0.62, BUN 12, Potassium 3.8, Sodium 138, BNP 200  Vital Signs: . Weight: 263 lbs (discharge weight: 284 lbs) . Blood pressure: 110/78 mmHg  . Heart rate: 75 bpm   Medication Assistance / Insurance Benefits Check: Does the patient have prescription insurance?  Yes Type of insurance plan: [employer]  Outpatient Pharmacy:  Current outpatient pharmacy: [facility] Was the [facility] pharmacy used to supply discharge medications? yes  If TOC pharmacy was used, were the refills transferred out to current pharmacy yet? no - transitioning to [facility] outpatient pharmacy - shares the same Rx profiling system so no transfers needed Is the patient willing to transition their outpatient pharmacy to utilize a [facility] outpatient pharmacy with or without mail order?   Yes - enrolling in [facility] mail order pharmacy  Assessment: 1) Chronic systolic CHF (EF 13%), due to ICM s/p DES on 07/29/20. NYHA class II symptoms. - Continue furosemide 20 mg daily - Continue metoprolol XL 25 mg daily - Continue Entresto 24/26 mg BID - Reduce spironolactone back to 12.5 mg daily - Continue Farxiga 10 mg daily  Plan: 1) Medication changes: - Reduce spironolactone back to 12.5 mg daily  2) Patient Assistance: - Copay cards registered and provided to patient for Sherryll Burger, Farxiga, and Brilinta prior to discharge (lowers copay from $55 to $15 for all three medications)  - Will soon be receiving Medicaid  3) Follow up: - Next appointment with [facility] on 08/15/20  Sharen Hones, PharmD, BCPS Heart Failure Transitions of Care Clinic Pharmacist [phone]

## 2020-08-05 NOTE — Telephone Encounter (Signed)
Called to confirm Heart & Vascular Transitions of Care appointment today @ 2pm. Patient reminded to bring all medications and pill box organizer with them. Confirmed patient has transportation. Gave directions, instructed to utilize valet parking.  Confirmed appointment prior to ending call.   Ozella Rocks, RN, BSN Heart Failure Nurse Navigator [phone]

## 2020-08-05 NOTE — Progress Notes (Signed)
CSW met with patient and caregiver in the clinic. Patient reports she has received confirmation from Social security that she was approved for SSI and SSD and anticipates that she will receive Medicaid approval shortly. Patient denies any concerns at this time and will follow up with CSW if needed. Raquel Sarna, [place], [place]

## 2020-08-05 NOTE — Progress Notes (Signed)
Heart and Vascular Center Transitions of Care Clinic  HAF:[facility], [facility] Primary Cardiologist: Dietrich Pates  HPI:  Kristine Montgomery is a 55 y.o.  female  with a PMH significant for newly diagnosed multivessel CAD s/p NSTEMI with DES x2, HLD, tobacco use disorder, COPD.    No prior cardiac history, 07/28/20 sought care in the ED after a week of dyspnea on exertion and 2-3 nights of what she thought was indigestion substernal chest discomfort.  Troponin was elevated to 200 ekg with inferolateral ST depressions.  Taken for [facility]:   Prox LAD lesion is 95% stenosed.  Ramus lesion is 40% stenosed.  Ost LAD lesion is 20% stenosed.  Ost Cx lesion is 30% stenosed.  Prox Cx lesion is 95% stenosed.  Prox RCA to Mid RCA lesion is 100% stenosed.  Post intervention, there is a 0% residual stenosis.  Post intervention, there is a 0% residual stenosis.  A stent was successfully placed.  A stent was successfully placed.   Severe multivessel CAD with 30% smooth ostial narrowing of the LAD followed by focal 95% eccentric proximal stenosis; smooth 40% ostial narrowing of a ramus intermediate vessel; 30% ostial narrowing of the circumflex followed by 95% proximal stenosis on a bend in the proximal circumflex; and total Lee occluded proximal RCA with both bridging antegrade collaterals as well as retrograde collaterals supplying the distal RCA.  Moderately elevated right heart pressures with Pulmonary Hypertenstion with a mean PA pressure at 41 mm.  Successful two-vessel coronary intervention involving a 95% proximal LAD stenosis with insertion of a 3.5 x 15 mm Resolute Onyx stent postdilated to 3.75 mm and 95% proximal left circumflex stenosis with insertion of a 3.0 x 15 mm Resolute Onyx stent postdilated to 3.3 mm with the stenoses being reduced to 0%.  Had an ECHO with LVEF 30%, mild MR, G2DD  Quit drinking sodas, hasn't smoked in 9 days.  Still getting second hand smoke from her father who  smokes in the kitchen.    Taking 25mg  of spironolactone for 3 days instead of 12.5mg  but took 12.5mg  today.  She is very worried about this reassured her that this wouldn't hurt her.    Weighing herself daily, 268-271 last several days.  Feeling tired initially after hospitalization but much more energy now.  Has not had any chest pain.  Some dyspnea when she is in a hurry still but none at rest or with minimal exertion.    ROS: All systems negative except as listed in HPI, PMH and Problem List.  SH:  Social History   Socioeconomic History  . Marital status: Single    Spouse name: Not on file  . Number of children: 2  . Years of education: 9  . Highest education level: Not on file  Occupational History  . Not on file  Tobacco Use  . Smoking status: Former Smoker    Packs/day: 0.50    Years: 30.00    Pack years: 15.00    Types: Cigarettes  . Smokeless tobacco: Never Used  . Tobacco comment: pt states last cigarette was 3/13, prior to coming into hospital. Pt plans to continue cessation.  Vaping Use  . Vaping Use: Never used  Substance and Sexual Activity  . Alcohol use: Yes    Comment: occasional  . Drug use: No  . Sexual activity: Yes    Birth control/protection: Post-menopausal, Surgical    Comment: tubal  Other Topics Concern  . Not on file  Social History Narrative  .  Not on file   Social Determinants of Health   Financial Resource Strain: Low Risk   . Difficulty of Paying Living Expenses: Not very hard  Food Insecurity: No Food Insecurity  . Worried About Programme researcher, broadcasting/film/video in the Last Year: Never true  . Ran Out of Food in the Last Year: Never true  Transportation Needs: No Transportation Needs  . Lack of Transportation (Medical): No  . Lack of Transportation (Non-Medical): No  Physical Activity: Insufficiently Active  . Days of Exercise per Week: 1 day  . Minutes of Exercise per Session: 10 min  Stress: No Stress Concern Present  . Feeling of Stress :  Not at all  Social Connections: Socially Isolated  . Frequency of Communication with Friends and Family: More than three times a week  . Frequency of Social Gatherings with Friends and Family: More than three times a week  . Attends Religious Services: Never  . Active Member of Clubs or Organizations: No  . Attends Banker Meetings: Never  . Marital Status: Widowed  Intimate Partner Violence: Not At Risk  . Fear of Current or Ex-Partner: No  . Emotionally Abused: No  . Physically Abused: No  . Sexually Abused: No    FH:  Family History  Problem Relation Age of Onset  . Cancer Paternal Grandfather        colon  . Cancer Maternal Grandmother        lung  . COPD Father   . Non-Hodgkin's lymphoma Mother   . Diabetes Mother   . Cancer Mother        cervical  . Congestive Heart Failure Mother   . Hypertension Sister   . Other Son        fluid around heart    Past Medical History:  Diagnosis Date  . Acute combined systolic and diastolic CHF, NYHA class 4 (HCC) 07/29/2020  . Arthritis   . Asthma   . Chronic bronchitis (HCC)   . COPD (chronic obstructive pulmonary disease) (HCC)   . Edema extremities   . History of hiatal hernia   . Hyperlipidemia LDL goal <70 07/29/2020  . Ovarian cyst   . Pneumonia     Current Outpatient Medications  Medication Sig Dispense Refill  . aspirin EC 81 MG tablet Take 1 tablet (81 mg total) by mouth daily. Swallow whole. 30 tablet 11  . atorvastatin (LIPITOR) 80 MG tablet Take 1 tablet (80 mg total) by mouth daily. 30 tablet 6  . dapagliflozin propanediol (FARXIGA) 10 MG TABS tablet Take 1 tablet (10 mg total) by mouth daily. 30 tablet 6  . furosemide (LASIX) 20 MG tablet Take 1 tablet (20 mg total) by mouth daily. Take an additional 1 tablet for 3 pound weight gain overnight or 5 pounds in 1 week 30 tablet 6  . metoprolol succinate (TOPROL-XL) 25 MG 24 hr tablet Take 1 tablet (25 mg total) by mouth daily. 30 tablet 6  .  nitroGLYCERIN (NITROSTAT) 0.4 MG SL tablet Place 1 tablet (0.4 mg total) under the tongue every 5 (five) minutes x 3 doses as needed for chest pain. 25 tablet 12  . sacubitril-valsartan (ENTRESTO) 24-26 MG Take 1 tablet by mouth 2 (two) times daily. 60 tablet 11  . spironolactone (ALDACTONE) 25 MG tablet Take 0.5 tablets (12.5 mg total) by mouth daily. 30 tablet 6  . ticagrelor (BRILINTA) 90 MG TABS tablet Take 1 tablet (90 mg total) by mouth 2 (two) times daily. 60 tablet  11   No current facility-administered medications for this encounter.    Vitals:   08/05/20 1412  BP: 110/78  Pulse: 75  SpO2: 95%  Weight: 119.6 kg (263 lb 9.6 oz)    PHYSICAL EXAM: Cardiac: JVD flat, normal rate and rhythm, clear s1 and s2, no murmurs, rubs or gallops, trace LE edema Pulmonary: CTAB, not in distress Abdominal: non distended abdomen, soft and nontender Psych: Alert, conversant, in good spirits   ECG   ASSESSMENT & PLAN:  Combined Systolic and Diastolic CHF, Ischemic Cardiomyopathy: -LHC with severe multivessel disease.  S/p PCI x2 to prox LAD and prox cx  -RHC with Moderately elevated right heart pressures with Pulmonary Hypertenstion with a mean PA pressure at 41 mm. -ECHO with LVEF 30%, mild MR, G2DD -NYHA Class II symptoms, euvolemic, continue lasix 20mg  -On excellent GDMT Entresto 24/26, spiro 12.5, Toprol XL 25, Farxiga 10 -all medications are new for her and I will not titrate today bp is 110/78 -continue DAPT -needs a sleep study, carries the diagnosis of COPD but I cannot find PFT's would benefit from outpatient PFT's -told her to make her father smoke outside or change living environment  -check bmp today, next visit may be able to titrate GDMT further increasing bb or spiro and possibly switch lasix to PRN  Tobacco Use Disorder: -has not smoked since hospitalization, declines any patches or other cessation aids -discussed need to remove herself from second hand smoke as her  father still smokes in her house    Feel she is a good candidate for AHF clinic however she declined and would prefer to follow up at [facility]

## 2020-08-15 ENCOUNTER — Other Ambulatory Visit: Payer: Self-pay

## 2020-08-15 ENCOUNTER — Ambulatory Visit (INDEPENDENT_AMBULATORY_CARE_PROVIDER_SITE_OTHER): Payer: 59 | Admitting: Student

## 2020-08-15 ENCOUNTER — Encounter: Payer: Self-pay | Admitting: Student

## 2020-08-15 VITALS — BP 132/70 | HR 70 | Ht 61.0 in | Wt 261.0 lb

## 2020-08-15 DIAGNOSIS — Z79899 Other long term (current) drug therapy: Secondary | ICD-10-CM

## 2020-08-15 DIAGNOSIS — I10 Essential (primary) hypertension: Secondary | ICD-10-CM | POA: Diagnosis not present

## 2020-08-15 DIAGNOSIS — G4734 Idiopathic sleep related nonobstructive alveolar hypoventilation: Secondary | ICD-10-CM

## 2020-08-15 DIAGNOSIS — I25110 Atherosclerotic heart disease of native coronary artery with unstable angina pectoris: Secondary | ICD-10-CM | POA: Diagnosis not present

## 2020-08-15 DIAGNOSIS — I5042 Chronic combined systolic (congestive) and diastolic (congestive) heart failure: Secondary | ICD-10-CM | POA: Diagnosis not present

## 2020-08-15 DIAGNOSIS — E785 Hyperlipidemia, unspecified: Secondary | ICD-10-CM

## 2020-08-15 MED ORDER — SPIRONOLACTONE 25 MG PO TABS: 25.0000 mg | ORAL_TABLET | Freq: Every day | ORAL | 3 refills | Status: DC

## 2020-08-15 NOTE — Patient Instructions (Addendum)
Medication Instructions:  Your physician has recommended you make the following change in your medication:   Increase Spironolactone to 25 mg Daily   *If you need a refill on your cardiac medications before your next appointment, please call your pharmacy*   Lab Work: Your physician recommends that you return for lab work in: 4 Weeks ( 09/15/20)  If you have labs (blood work) drawn today and your tests are completely normal, you will receive your results only by: Marland Kitchen MyChart Message (if you have MyChart) OR . A paper copy in the mail If you have any lab test that is abnormal or we need to change your treatment, we will call you to review the results.   Testing/Procedures: Your physician has requested that you have an echocardiogram. Echocardiography is a painless test that uses sound waves to create images of your heart. It provides your doctor with information about the size and shape of your heart and how well your heart's chambers and valves are working. This procedure takes approximately one hour. There are no restrictions for this procedure.   Follow-Up: At [facility], you and your health needs are our priority.  As part of our continuing mission to provide you with exceptional heart care, we have created designated Provider Care Teams.  These Care Teams include your primary Cardiologist (physician) and Advanced Practice Providers (APPs -  Physician Assistants and Nurse Practitioners) who all work together to provide you with the care you need, when you need it.  We recommend signing up for the patient portal called "MyChart".  Sign up information is provided on this After Visit Summary.  MyChart is used to connect with patients for Virtual Visits (Telemedicine).  Patients are able to view lab/test results, encounter notes, upcoming appointments, etc.  Non-urgent messages can be sent to your provider as well.   To learn more about what you can do with MyChart, go to  [email or web address].    Your next appointment:   3 month(s)  The format for your next appointment:   In Person  Provider:   Dietrich Pates, MD or Randall An, PA-C   Other Instructions Thank you for choosing [facility]!    Two Gram Sodium Diet 2000 mg  What is Sodium? Sodium is a mineral found naturally in many foods. The most significant source of sodium in the diet is table salt, which is about 40% sodium.  Processed, convenience, and preserved foods also contain a large amount of sodium.  The body needs only 500 mg of sodium daily to function,  A normal diet provides more than enough sodium even if you do not use salt.  Why Limit Sodium? A build up of sodium in the body can cause thirst, increased blood pressure, shortness of breath, and water retention.  Decreasing sodium in the diet can reduce edema and risk of heart attack or stroke associated with high blood pressure.  Keep in mind that there are many other factors involved in these health problems.  Heredity, obesity, lack of exercise, cigarette smoking, stress and what you eat all play a role.  General Guidelines:  Do not add salt at the table or in cooking.  One teaspoon of salt contains over 2 grams of sodium.  Read food labels  Avoid processed and convenience foods  Ask your dietitian before eating any foods not dicussed in the menu planning guidelines  Consult your physician if you wish to use a salt substitute or a sodium containing medication such as  antacids.  Limit milk and milk products to 16 oz (2 cups) per day.  Shopping Hints:  READ LABELS!! "Dietetic" does not necessarily mean low sodium.  Salt and other sodium ingredients are often added to foods during processing.   Menu Planning Guidelines Food Group Choose More Often Avoid  Beverages (see also the milk group All fruit juices, low-sodium, salt-free vegetables juices, low-sodium carbonated beverages Regular vegetable or tomato  juices, commercially softened water used for drinking or cooking  Breads and Cereals Enriched white, wheat, rye and pumpernickel bread, hard rolls and dinner rolls; muffins, cornbread and waffles; most dry cereals, cooked cereal without added salt; unsalted crackers and breadsticks; low sodium or homemade bread crumbs Bread, rolls and crackers with salted tops; quick breads; instant hot cereals; pancakes; commercial bread stuffing; self-rising flower and biscuit mixes; regular bread crumbs or cracker crumbs  Desserts and Sweets Desserts and sweets mad with mild should be within allowance Instant pudding mixes and cake mixes  Fats Butter or margarine; vegetable oils; unsalted salad dressings, regular salad dressings limited to 1 Tbs; light, sour and heavy cream Regular salad dressings containing bacon fat, bacon bits, and salt pork; snack dips made with instant soup mixes or processed cheese; salted nuts  Fruits Most fresh, frozen and canned fruits Fruits processed with salt or sodium-containing ingredient (some dried fruits are processed with sodium sulfites        Vegetables Fresh, frozen vegetables and low- sodium canned vegetables Regular canned vegetables, sauerkraut, pickled vegetables, and others prepared in brine; frozen vegetables in sauces; vegetables seasoned with ham, bacon or salt pork  Condiments, Sauces, Miscellaneous  Salt substitute with physician's approval; pepper, herbs, spices; vinegar, lemon or lime juice; hot pepper sauce; garlic powder, onion powder, low sodium soy sauce (1 Tbs.); low sodium condiments (ketchup, chili sauce, mustard) in limited amounts (1 tsp.) fresh ground horseradish; unsalted tortilla chips, pretzels, potato chips, popcorn, salsa (1/4 cup) Any seasoning made with salt including garlic salt, celery salt, onion salt, and seasoned salt; sea salt, rock salt, kosher salt; meat tenderizers; monosodium glutamate; mustard, regular soy sauce, barbecue, sauce, chili  sauce, teriyaki sauce, steak sauce, Worcestershire sauce, and most flavored vinegars; canned gravy and mixes; regular condiments; salted snack foods, olives, picles, relish, horseradish sauce, catsup   Food preparation: Try these seasonings Meats:    Pork Sage, onion Serve with applesauce  Chicken Poultry seasoning, thyme, parsley Serve with cranberry sauce  Lamb Curry powder, rosemary, garlic, thyme Serve with mint sauce or jelly  Veal Marjoram, basil Serve with current jelly, cranberry sauce  Beef Pepper, bay leaf Serve with dry mustard, unsalted chive butter  Fish [place] leaf, dill Serve with unsalted lemon butter, unsalted parsley butter  Vegetables:    Asparagus Lemon juice   Broccoli Lemon juice   Carrots Mustard dressing parsley, mint, nutmeg, glazed with unsalted butter and sugar   Green beans Marjoram, lemon juice, nutmeg,dill seed   Tomatoes Basil, marjoram, onion   Spice /blend for [employer]" 4 tsp ground thyme 1 tsp ground sage 3 tsp ground rosemary 4 tsp ground marjoram

## 2020-08-15 NOTE — Progress Notes (Signed)
Cardiology Office Note    Date:  08/15/2020   ID:  Kristine Montgomery, DOB 03-09-1966, MRN [id number]  PCP:  Elfredia Nevins, MD  Cardiologist: Dietrich Pates, MD    Chief Complaint  Patient presents with  . Hospitalization Follow-up    History of Present Illness:    Kristine Montgomery is a 55 y.o. female with past medical history of tobacco use and family history of CAD who presents to the office today for hospital follow-up.  She recently presented to [facility] ED on 07/28/2020 for evaluation of chest pain which had started the prior evening. Hs Troponin values were elevated to 390 and BNP at 200. Echocardiogram showed a reduced EF of 30% with diffuse hypokinesis and RV function was normal. Cardiac catheterization the following day showed severe multivessel CAD with 95% proximal LAD stenosis, 40% ramus stenosis, 30% ostial LCx stenosis, 95% proximal LCx stenosis and 100% proximal RCA to mid RCA stenosis. She did have moderately elevated right heart pressures with mean PA pressure at 41 mm. She underwent successful two-vessel PCI with DES placement to the proximal LAD and DES placement to the proximal LCx with medical management recommended of the occluded RCA. Was recommended to be on DAPT with ASA and Brilinta for a minimum of 1 year. In regards to her cardiomyopathy, she was started on Farxiga 10 mg daily, Toprol-XL  daily, Lasix 20 mg daily, Entresto 24-26 mg twice daily and Spironolactone 12.5 mg daily. Was recommended she have an outpatient sleep study given hypoxia at night during her admission.  She did follow-up in the [facility] on 08/05/2020 and reported feeling fatigued initially following her hospital stay but this had improved. She denied any recurrent chest pain and dyspnea was overall stable. Her weight was at 263 lbs and BP was at 110/78, therefore medical therapy was not titrated.  In talking with the patient today, she reports overall doing well  since her recent hospitalization. She reports her breathing has significantly improved and she denies any recent exertional chest pain or palpitations. No recent orthopnea, PND or lower extremity edema. She reports good compliance with her medication regimen and denies missing any recent doses.  She has been trying to monitor the sodium content in her diet along with limiting fluid intake to less than 2 L. She has not smoked since her hospitalization!  Past Medical History:  Diagnosis Date  . Acute combined systolic and diastolic CHF, NYHA class 4 (HCC) 07/29/2020   a. EF 30% by echo in 07/2020  . Arthritis   . Asthma   . CAD (coronary artery disease)    a. 07/2020: s/p NSTEMI with DES to proxLAD and DES to proxLCx with medical management recommended of the occluded RCA.  Marland Kitchen Chronic bronchitis (HCC)   . COPD (chronic obstructive pulmonary disease) (HCC)   . Edema extremities   . History of hiatal hernia   . Hyperlipidemia LDL goal <70 07/29/2020  . Ovarian cyst   . Pneumonia     Past Surgical History:  Procedure Laterality Date  . CESAREAN SECTION    . CORONARY STENT INTERVENTION N/A 07/29/2020   Procedure: CORONARY STENT INTERVENTION;  Surgeon: Lennette Bihari, MD;  Location: [facility] INVASIVE CV LAB;  Service: Cardiovascular;  Laterality: N/A;  . KNEE ARTHROSCOPY WITH MEDIAL MENISECTOMY Right 05/23/2020   Procedure: KNEE ARTHROSCOPY WITH MEDIAL AND LATERAL MENISECTOMY; 3 COMPARTMENT SYNOVECTOMY;  Surgeon: Vickki Hearing, MD;  Location: [facility] ORS;  Service: Orthopedics;  Laterality: Right;  . RIGHT/LEFT HEART CATH AND CORONARY ANGIOGRAPHY N/A 07/29/2020   Procedure: RIGHT/LEFT HEART CATH AND CORONARY ANGIOGRAPHY;  Surgeon: Lennette Bihari, MD;  Location: [facility] INVASIVE CV LAB;  Service: Cardiovascular;  Laterality: N/A;  . TONSILLECTOMY    . TUBAL LIGATION      Current Medications: Outpatient Medications Prior to Visit  Medication Sig Dispense Refill  . aspirin EC 81 MG tablet Take 1 tablet  (81 mg total) by mouth daily. Swallow whole. 30 tablet 11  . atorvastatin (LIPITOR) 80 MG tablet Take 1 tablet (80 mg total) by mouth daily. 30 tablet 6  . dapagliflozin propanediol (FARXIGA) 10 MG TABS tablet Take 1 tablet (10 mg total) by mouth daily. 30 tablet 6  . furosemide (LASIX) 20 MG tablet Take 1 tablet (20 mg total) by mouth daily. Take an additional 1 tablet for 3 pound weight gain overnight or 5 pounds in 1 week 30 tablet 6  . metoprolol succinate (TOPROL-XL) 25 MG 24 hr tablet Take 1 tablet (25 mg total) by mouth daily. 30 tablet 6  . nitroGLYCERIN (NITROSTAT) 0.4 MG SL tablet Place 1 tablet (0.4 mg total) under the tongue every 5 (five) minutes x 3 doses as needed for chest pain. 25 tablet 12  . sacubitril-valsartan (ENTRESTO) 24-26 MG Take 1 tablet by mouth 2 (two) times daily. 60 tablet 11  . ticagrelor (BRILINTA) 90 MG TABS tablet Take 1 tablet (90 mg total) by mouth 2 (two) times daily. 60 tablet 11  . spironolactone (ALDACTONE) 25 MG tablet Take 0.5 tablets (12.5 mg total) by mouth daily. 30 tablet 6   No facility-administered medications prior to visit.     Allergies:   Patient has no known allergies.   Social History   Socioeconomic History  . Marital status: Single    Spouse name: Not on file  . Number of children: 2  . Years of education: 29  . Highest education level: Not on file  Occupational History  . Not on file  Tobacco Use  . Smoking status: Former Smoker    Packs/day: 0.50    Years: 30.00    Pack years: 15.00    Types: Cigarettes  . Smokeless tobacco: Never Used  . Tobacco comment: pt states last cigarette was 3/13, prior to coming into hospital. Pt plans to continue cessation.  Vaping Use  . Vaping Use: Never used  Substance and Sexual Activity  . Alcohol use: Yes    Comment: occasional  . Drug use: No  . Sexual activity: Yes    Birth control/protection: Post-menopausal, Surgical    Comment: tubal  Other Topics Concern  . Not on file   Social History Narrative  . Not on file   Social Determinants of Health   Financial Resource Strain: Low Risk   . Difficulty of Paying Living Expenses: Not very hard  Food Insecurity: No Food Insecurity  . Worried About Programme researcher, broadcasting/film/video in the Last Year: Never true  . Ran Out of Food in the Last Year: Never true  Transportation Needs: No Transportation Needs  . Lack of Transportation (Medical): No  . Lack of Transportation (Non-Medical): No  Physical Activity: Insufficiently Active  . Days of Exercise per Week: 1 day  . Minutes of Exercise per Session: 10 min  Stress: No Stress Concern Present  . Feeling of Stress : Not at all  Social Connections: Socially Isolated  . Frequency of Communication with Friends and Family: More than three times a  week  . Frequency of Social Gatherings with Friends and Family: More than three times a week  . Attends Religious Services: Never  . Active Member of Clubs or Organizations: No  . Attends Banker Meetings: Never  . Marital Status: Widowed     Family History:  The patient's family history includes COPD in her father; Cancer in her maternal grandmother, mother, and paternal grandfather; Congestive Heart Failure in her mother; Diabetes in her mother; Hypertension in her sister; Non-Hodgkin's lymphoma in her mother; Other in her son.   Review of Systems:   Please see the history of present illness.     General:  No chills, fever, night sweats or weight changes.  Cardiovascular:  No dyspnea on exertion, edema, orthopnea, palpitations, paroxysmal nocturnal dyspnea. Positive for exertional chest pain (resolved).  Dermatological: No rash, lesions/masses Respiratory: No cough, dyspnea Urologic: No hematuria, dysuria Abdominal:   No nausea, vomiting, diarrhea, bright red blood per rectum, melena, or hematemesis Neurologic:  No visual changes, wkns, changes in mental status. All other systems reviewed and are otherwise negative  except as noted above.   Physical Exam:    VS:  BP 132/70   Pulse 70   Ht 5\' 1"  (1.549 m)   Wt 261 lb (118.4 kg)   LMP 06/27/2017 (Within Weeks)   SpO2 96%   BMI 49.32 kg/m    General: Well developed, obese female appearing in no acute distress. Head: Normocephalic, atraumatic. Neck: No carotid bruits. JVD not elevated.  Lungs: Respirations regular and unlabored, without wheezes or rales.  Heart: Regular rate and rhythm. No S3 or S4.  No murmur, no rubs, or gallops appreciated. Abdomen: Appears non-distended. No obvious abdominal masses. Msk:  Strength and tone appear normal for age. No obvious joint deformities or effusions. Extremities: No clubbing or cyanosis. No lower extremity edema.  Distal pedal pulses are 2+ bilaterally. Neuro: Alert and oriented X 3. Moves all extremities spontaneously. No focal deficits noted. Psych:  Responds to questions appropriately with a normal affect. Skin: No rashes or lesions noted  Wt Readings from Last 3 Encounters:  08/15/20 261 lb (118.4 kg)  08/05/20 263 lb 9.6 oz (119.6 kg)  07/31/20 284 lb 13.4 oz (129.2 kg)     Studies/Labs Reviewed:   EKG:  EKG is ordered today. The EKG ordered today demonstrates normal sinus rhythm, heart rate 77 with occasional PVC's. No acute ST changes when compared to prior tracings.  Recent Labs: 07/28/2020: ALT 15; B Natriuretic Peptide 200.0 07/31/2020: Hemoglobin 11.7; Platelets 279 08/05/2020: BUN 18; Creatinine, Ser 0.83; Potassium 3.4; Sodium 132   Lipid Panel    Component Value Date/Time   CHOL 211 (H) 07/29/2020 0236   TRIG 186 (H) 07/29/2020 0236   HDL 22 (L) 07/29/2020 0236   CHOLHDL 9.6 07/29/2020 0236   VLDL 37 07/29/2020 0236   LDLCALC 152 (H) 07/29/2020 0236    Additional studies/ records that were reviewed today include:   Echocardiogram: 07/2020 IMPRESSIONS    1. Poor acoustic windows limit study Definity used. Overall LVEF is  approximately 30% with diffuse hypokinesis worse in  the septal, mid/distal  anterior, distal inferior, distal lateral and apical walls. . The left  ventricular internal cavity size was  mildly to moderately dilated. Left ventricular diastolic parameters are  indeterminate.  2. Right ventricular systolic function is normal. The right ventricular  size is normal.  3. Mild mitral valve regurgitation.  4. The aortic valve is tricuspid. Aortic valve regurgitation is not  visualized. Mild aortic valve sclerosis is present, with no evidence of  aortic valve stenosis.  5. The inferior vena cava is dilated in size with >50% respiratory  variability, suggesting right atrial pressure of 8 mmHg.   Cardiac Catheterization: 07/2020  Prox LAD lesion is 95% stenosed.  Ramus lesion is 40% stenosed.  Ost LAD lesion is 20% stenosed.  Ost Cx lesion is 30% stenosed.  Prox Cx lesion is 95% stenosed.  Prox RCA to Mid RCA lesion is 100% stenosed.  Post intervention, there is a 0% residual stenosis.  Post intervention, there is a 0% residual stenosis.  A stent was successfully placed.  A stent was successfully placed.   Severe multivessel CAD with 30% smooth ostial narrowing of the LAD followed by focal 95% eccentric proximal stenosis; smooth 40% ostial narrowing of a ramus intermediate vessel; 30% ostial narrowing of the circumflex followed by 95% proximal stenosis on a bend in the proximal circumflex; and total Lee occluded proximal RCA with both bridging antegrade collaterals as well as retrograde collaterals supplying the distal RCA.  Moderately elevated right heart pressures with very hypertension with a mean PA pressure at 41 mm.  Successful two-vessel coronary intervention involving a 95% proximal LAD stenosis with insertion of a 3.5 x 15 mm Resolute Onyx stent postdilated to 3.75 mm and 95% proximal left circumflex stenosis with insertion of a 3.0 x 15 mm Resolute Onyx stent postdilated to 3.3 mm with the stenoses being reduced to  0%.  RECOMMENDATION: DAPT with aspirin/Brilinta for minimum of 1 year.  Bivalirudin will be discontinued upon completion of the current bag with sheath pull several hours later.  Patient was given furosemide 40 mg IV at the completion of the procedure.  She will be started on carvedilol 3.125 mg twice a day and either ARB or Entresto tomorrow.  Hopefully LV function will improve with revascularization of both the LAD and circumflex vessels.    Assessment:    1. Coronary artery disease involving native coronary artery of native heart with unstable angina pectoris (HCC)   2. Chronic combined systolic and diastolic congestive heart failure (HCC)   3. Medication management   4. Essential hypertension   5. Hyperlipidemia LDL goal <70   6. Nocturnal oxygen desaturation      Plan:   In order of problems listed above:  1. CAD - She is s/p recent NSTEMI and underwent successful two-vessel PCI with DES placement to the proximal LAD and DES placement to the proximal LCx with medical management recommended of the occluded RCA. - She denies any recurrent anginal symptoms.   - Continue ASA, Brilinta, Toprol-XL 25mg  daily and Atorvastatin 80mg  daily.   2. Chronic Systolic CHF/Ischemic Cardiomyopathy - Her EF was at 30% by echocardiogram during recent admission. She denies any recent orthopnea, PND or edema.  - Will plan for a repeat echocardiogram in 3 months for reassessment of EF and wall motion. Continue current medication regimen with Farxiga 10 mg daily, Lasix 20 mg daily, Entresto 24-26 mg twice daily. Will titrate Spironolactone from 12.5 mg daily to 25mg  daily as BP allows for this and to assist with hypokalemia noted on recent labs. Recheck K+ and renal function in 3-4 weeks with upcoming FLP as outlined below.   3. HTN - BP is well-controlled at 132/70 during today's visit. Will titrate Spironolactone to 25mg  daily. Continue Toprol-XL and Entresto at current dosing.    4. HLD - FLP  during recent admission showed total cholesterol 211, triglycerides 186,  HDL 22 and LDL 152.  Continue current regimen with Atorvastatin 80 mg daily. Will recheck FLP and LFT's in 3-4 weeks.   5. Nocturnal Desaturations - She has been in contact with [facility] per her report and is awaiting insurance approval for her sleep study.    Medication Adjustments/Labs and Tests Ordered: Current medicines are reviewed at length with the patient today.  Concerns regarding medicines are outlined above.  Medication changes, Labs and Tests ordered today are listed in the Patient Instructions below. Patient Instructions    Medication Instructions:  Your physician has recommended you make the following change in your medication:   Increase Spironolactone to 25 mg Daily   *If you need a refill on your cardiac medications before your next appointment, please call your pharmacy*   Lab Work: Your physician recommends that you return for lab work in: 4 Weeks ( 09/15/20)  If you have labs (blood work) drawn today and your tests are completely normal, you will receive your results only by: Marland Kitchen. MyChart Message (if you have MyChart) OR . A paper copy in the mail If you have any lab test that is abnormal or we need to change your treatment, we will call you to review the results.   Testing/Procedures: Your physician has requested that you have an echocardiogram. Echocardiography is a painless test that uses sound waves to create images of your heart. It provides your doctor with information about the size and shape of your heart and how well your heart's chambers and valves are working. This procedure takes approximately one hour. There are no restrictions for this procedure.   Follow-Up: At [facility], you and your health needs are our priority.  As part of our continuing mission to provide you with exceptional heart care, we have created designated Provider Care Teams.  These Care Teams include your  primary Cardiologist (physician) and Advanced Practice Providers (APPs -  Physician Assistants and Nurse Practitioners) who all work together to provide you with the care you need, when you need it.  We recommend signing up for the patient portal called "MyChart".  Sign up information is provided on this After Visit Summary.  MyChart is used to connect with patients for Virtual Visits (Telemedicine).  Patients are able to view lab/test results, encounter notes, upcoming appointments, etc.  Non-urgent messages can be sent to your provider as well.   To learn more about what you can do with MyChart, go to [email or web address].    Your next appointment:   3 month(s)  The format for your next appointment:   In Person  Provider:   Dietrich PatesPaula Ross, MD or Randall AnBrittany Dreamer Carillo, PA-C   Other Instructions Thank you for choosing [facility]!    Two Gram Sodium Diet 2000 mg  What is Sodium? Sodium is a mineral found naturally in many foods. The most significant source of sodium in the diet is table salt, which is about 40% sodium.  Processed, convenience, and preserved foods also contain a large amount of sodium.  The body needs only 500 mg of sodium daily to function,  A normal diet provides more than enough sodium even if you do not use salt.  Why Limit Sodium? A build up of sodium in the body can cause thirst, increased blood pressure, shortness of breath, and water retention.  Decreasing sodium in the diet can reduce edema and risk of heart attack or stroke associated with high blood pressure.  Keep in mind that there are  many other factors involved in these health problems.  Heredity, obesity, lack of exercise, cigarette smoking, stress and what you eat all play a role.  General Guidelines:  Do not add salt at the table or in cooking.  One teaspoon of salt contains over 2 grams of sodium.  Read food labels  Avoid processed and convenience foods  Ask your dietitian before eating any  foods not dicussed in the menu planning guidelines  Consult your physician if you wish to use a salt substitute or a sodium containing medication such as antacids.  Limit milk and milk products to 16 oz (2 cups) per day.  Shopping Hints:  READ LABELS!! "Dietetic" does not necessarily mean low sodium.  Salt and other sodium ingredients are often added to foods during processing.   Menu Planning Guidelines Food Group Choose More Often Avoid  Beverages (see also the milk group All fruit juices, low-sodium, salt-free vegetables juices, low-sodium carbonated beverages Regular vegetable or tomato juices, commercially softened water used for drinking or cooking  Breads and Cereals Enriched white, wheat, rye and pumpernickel bread, hard rolls and dinner rolls; muffins, cornbread and waffles; most dry cereals, cooked cereal without added salt; unsalted crackers and breadsticks; low sodium or homemade bread crumbs Bread, rolls and crackers with salted tops; quick breads; instant hot cereals; pancakes; commercial bread stuffing; self-rising flower and biscuit mixes; regular bread crumbs or cracker crumbs  Desserts and Sweets Desserts and sweets mad with mild should be within allowance Instant pudding mixes and cake mixes  Fats Butter or margarine; vegetable oils; unsalted salad dressings, regular salad dressings limited to 1 Tbs; light, sour and heavy cream Regular salad dressings containing bacon fat, bacon bits, and salt pork; snack dips made with instant soup mixes or processed cheese; salted nuts  Fruits Most fresh, frozen and canned fruits Fruits processed with salt or sodium-containing ingredient (some dried fruits are processed with sodium sulfites        Vegetables Fresh, frozen vegetables and low- sodium canned vegetables Regular canned vegetables, sauerkraut, pickled vegetables, and others prepared in brine; frozen vegetables in sauces; vegetables seasoned with ham, bacon or salt pork   Condiments, Sauces, Miscellaneous  Salt substitute with physician's approval; pepper, herbs, spices; vinegar, lemon or lime juice; hot pepper sauce; garlic powder, onion powder, low sodium soy sauce (1 Tbs.); low sodium condiments (ketchup, chili sauce, mustard) in limited amounts (1 tsp.) fresh ground horseradish; unsalted tortilla chips, pretzels, potato chips, popcorn, salsa (1/4 cup) Any seasoning made with salt including garlic salt, celery salt, onion salt, and seasoned salt; sea salt, rock salt, kosher salt; meat tenderizers; monosodium glutamate; mustard, regular soy sauce, barbecue, sauce, chili sauce, teriyaki sauce, steak sauce, Worcestershire sauce, and most flavored vinegars; canned gravy and mixes; regular condiments; salted snack foods, olives, picles, relish, horseradish sauce, catsup   Food preparation: Try these seasonings Meats:    Pork Sage, onion Serve with applesauce  Chicken Poultry seasoning, thyme, parsley Serve with cranberry sauce  Lamb Curry powder, rosemary, garlic, thyme Serve with mint sauce or jelly  Veal Marjoram, basil Serve with current jelly, cranberry sauce  Beef Pepper, bay leaf Serve with dry mustard, unsalted chive butter  Fish Bay leaf, dill Serve with unsalted lemon butter, unsalted parsley butter  Vegetables:    Asparagus Lemon juice   Broccoli Lemon juice   Carrots Mustard dressing parsley, mint, nutmeg, glazed with unsalted butter and sugar   Green beans Marjoram, lemon juice, nutmeg,dill seed   Tomatoes Basil, marjoram, onion  Spice /blend for "Salt Shaker" 4 tsp ground thyme 1 tsp ground sage 3 tsp ground rosemary 4 tsp ground marjoram     Signed, Ellsworth Lennox, PA-C  08/15/2020 7:07 PM    [facility] [address] [place], [place] [address] Phone: [phone] Fax: [phone]

## 2020-08-20 ENCOUNTER — Ambulatory Visit: Payer: 59 | Admitting: Physician Assistant

## 2020-08-25 ENCOUNTER — Other Ambulatory Visit (HOSPITAL_COMMUNITY): Payer: Self-pay

## 2020-08-25 MED FILL — Spironolactone Tab 25 MG: ORAL | 30 days supply | Qty: 15 | Fill #0 | Status: AC

## 2020-08-25 MED FILL — Dapagliflozin Propanediol Tab 10 MG (Base Equivalent): ORAL | 30 days supply | Qty: 30 | Fill #0 | Status: AC

## 2020-08-25 MED FILL — Dapagliflozin Propanediol Tab 10 MG (Base Equivalent): ORAL | 30 days supply | Qty: 30 | Fill #0 | Status: CN

## 2020-08-25 MED FILL — Sacubitril-Valsartan Tab 24-26 MG: ORAL | 30 days supply | Qty: 60 | Fill #0 | Status: AC

## 2020-08-25 MED FILL — Aspirin Tab Delayed Release 81 MG: ORAL | 30 days supply | Qty: 30 | Fill #0 | Status: AC

## 2020-08-25 MED FILL — Atorvastatin Calcium Tab 80 MG (Base Equivalent): ORAL | 30 days supply | Qty: 30 | Fill #0 | Status: AC

## 2020-08-25 MED FILL — Furosemide Tab 20 MG: ORAL | 15 days supply | Qty: 30 | Fill #0 | Status: AC

## 2020-08-25 MED FILL — Ticagrelor Tab 90 MG: ORAL | 30 days supply | Qty: 60 | Fill #0 | Status: AC

## 2020-08-25 MED FILL — Metoprolol Succinate Tab ER 24HR 25 MG (Tartrate Equiv): ORAL | 30 days supply | Qty: 30 | Fill #0 | Status: AC

## 2020-08-25 MED FILL — Nitroglycerin SL Tab 0.4 MG: SUBLINGUAL | 5 days supply | Qty: 25 | Fill #0 | Status: AC

## 2020-09-18 ENCOUNTER — Other Ambulatory Visit (HOSPITAL_COMMUNITY): Payer: Self-pay

## 2020-09-18 MED FILL — Atorvastatin Calcium Tab 80 MG (Base Equivalent): ORAL | 30 days supply | Qty: 30 | Fill #1 | Status: AC

## 2020-09-18 MED FILL — Metoprolol Succinate Tab ER 24HR 25 MG (Tartrate Equiv): ORAL | 30 days supply | Qty: 30 | Fill #1 | Status: AC

## 2020-09-18 MED FILL — Dapagliflozin Propanediol Tab 10 MG (Base Equivalent): ORAL | 30 days supply | Qty: 30 | Fill #1 | Status: AC

## 2020-09-18 MED FILL — Ticagrelor Tab 90 MG: ORAL | 30 days supply | Qty: 60 | Fill #1 | Status: AC

## 2020-09-18 MED FILL — Nitroglycerin SL Tab 0.4 MG: SUBLINGUAL | 5 days supply | Qty: 25 | Fill #1 | Status: AC

## 2020-09-18 MED FILL — Spironolactone Tab 25 MG: ORAL | 30 days supply | Qty: 15 | Fill #1 | Status: AC

## 2020-09-18 MED FILL — Furosemide Tab 20 MG: ORAL | 15 days supply | Qty: 30 | Fill #1 | Status: AC

## 2020-09-18 MED FILL — Sacubitril-Valsartan Tab 24-26 MG: ORAL | 30 days supply | Qty: 60 | Fill #1 | Status: AC

## 2020-09-18 MED FILL — Aspirin Tab Delayed Release 81 MG: ORAL | 30 days supply | Qty: 30 | Fill #1 | Status: AC

## 2020-10-14 ENCOUNTER — Other Ambulatory Visit (HOSPITAL_COMMUNITY): Payer: Self-pay

## 2020-10-14 MED FILL — Spironolactone Tab 25 MG: ORAL | 30 days supply | Qty: 15 | Fill #2 | Status: AC

## 2020-10-17 ENCOUNTER — Other Ambulatory Visit (HOSPITAL_COMMUNITY): Payer: Self-pay

## 2020-10-26 IMAGING — DX DG KNEE COMPLETE 4+V*R*
4 series · 4 of 4 positions shown · non-contrast
Comparison: 08/18/2019

CLINICAL DATA: Pain

EXAM:
RIGHT KNEE - COMPLETE 4+ VIEW

[knee ap]
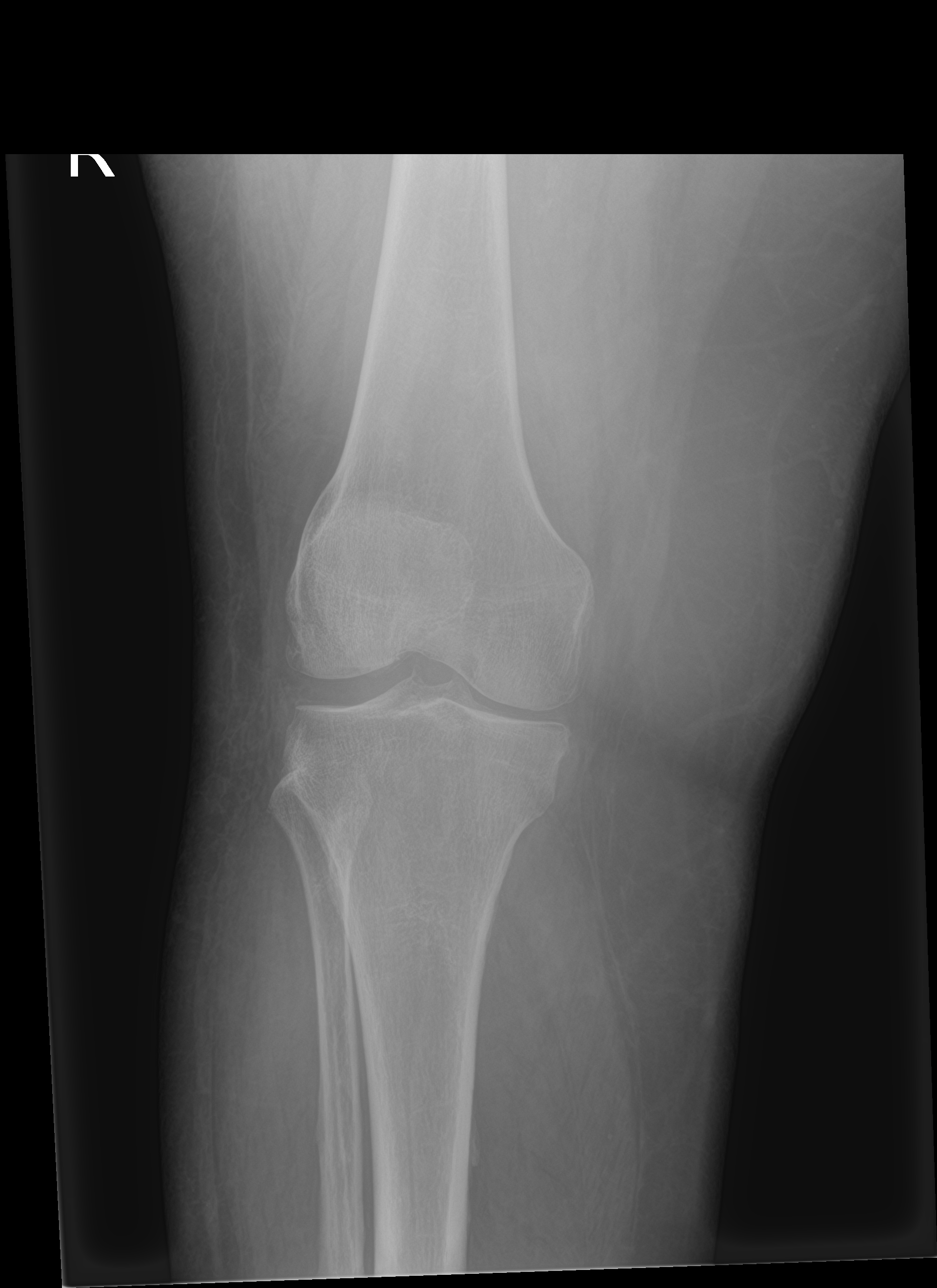

[knee obl (1 of 2)]
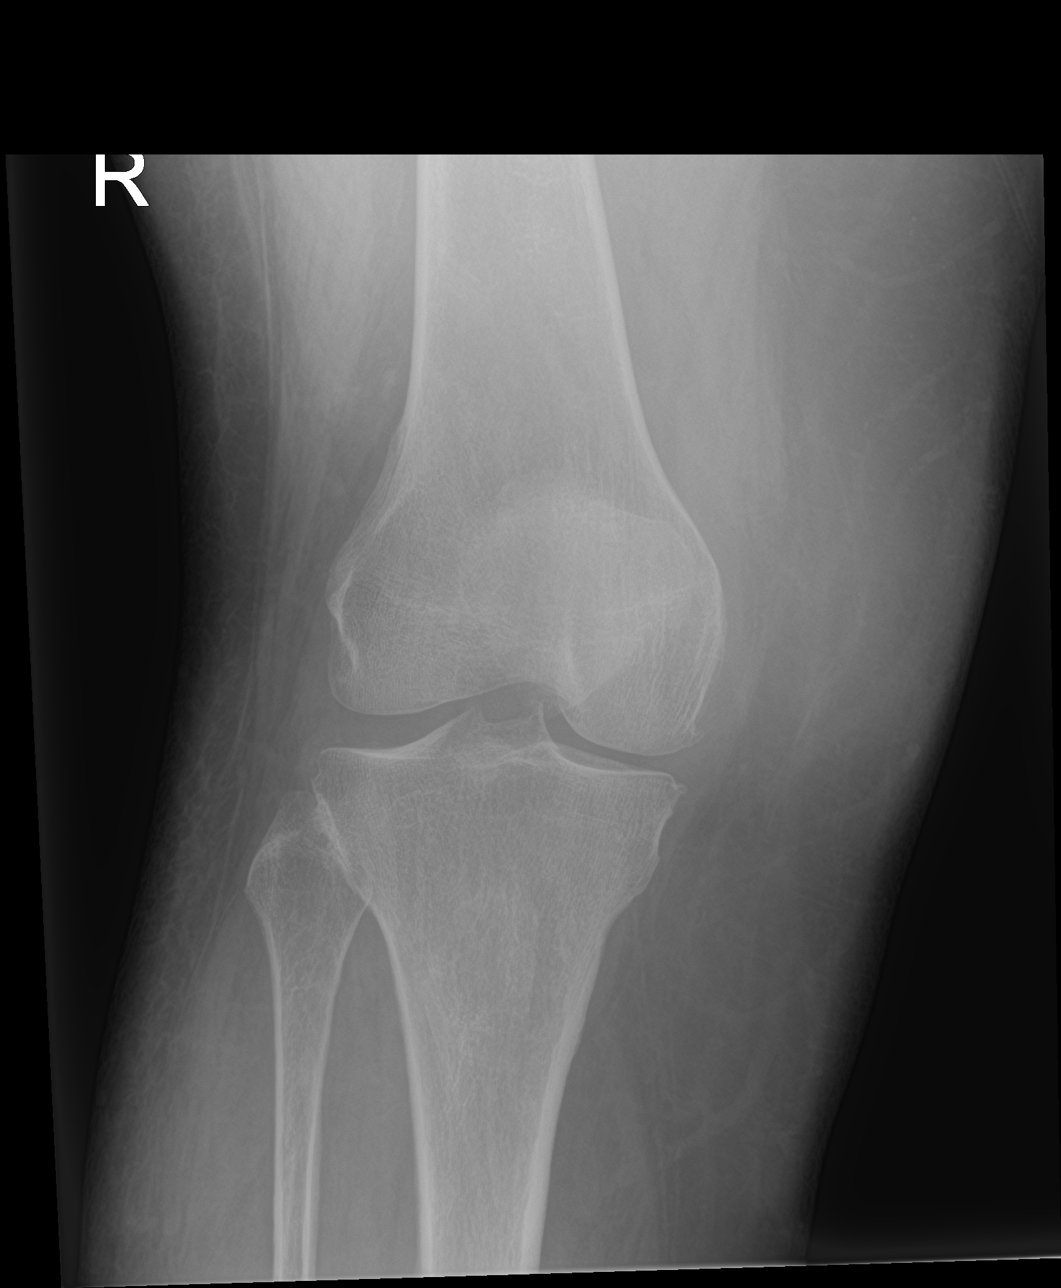

[knee obl (2 of 2)]
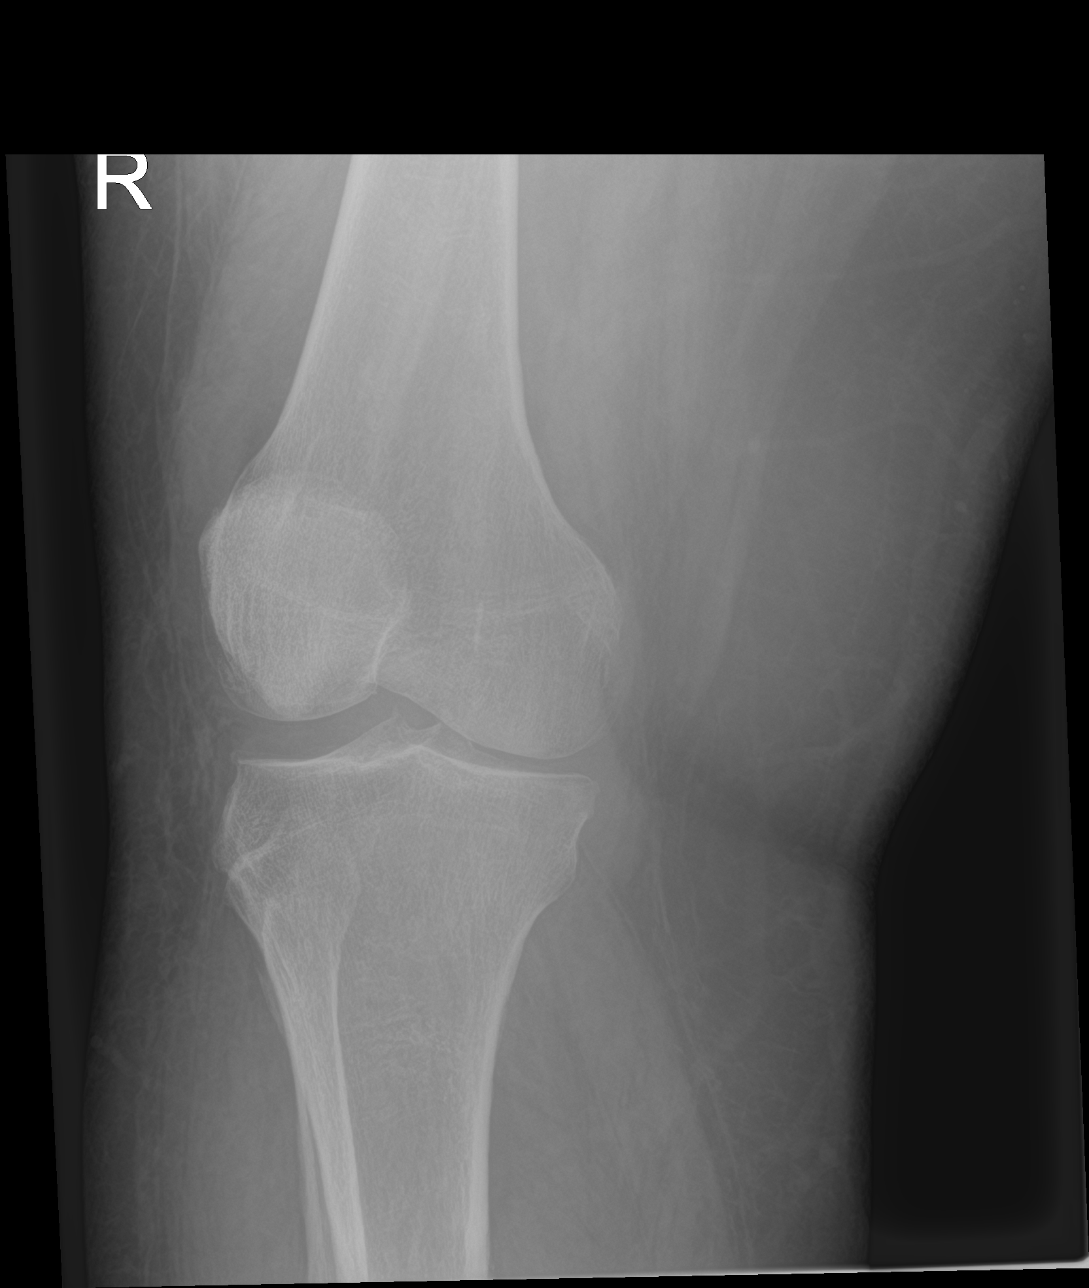

[knee lat]
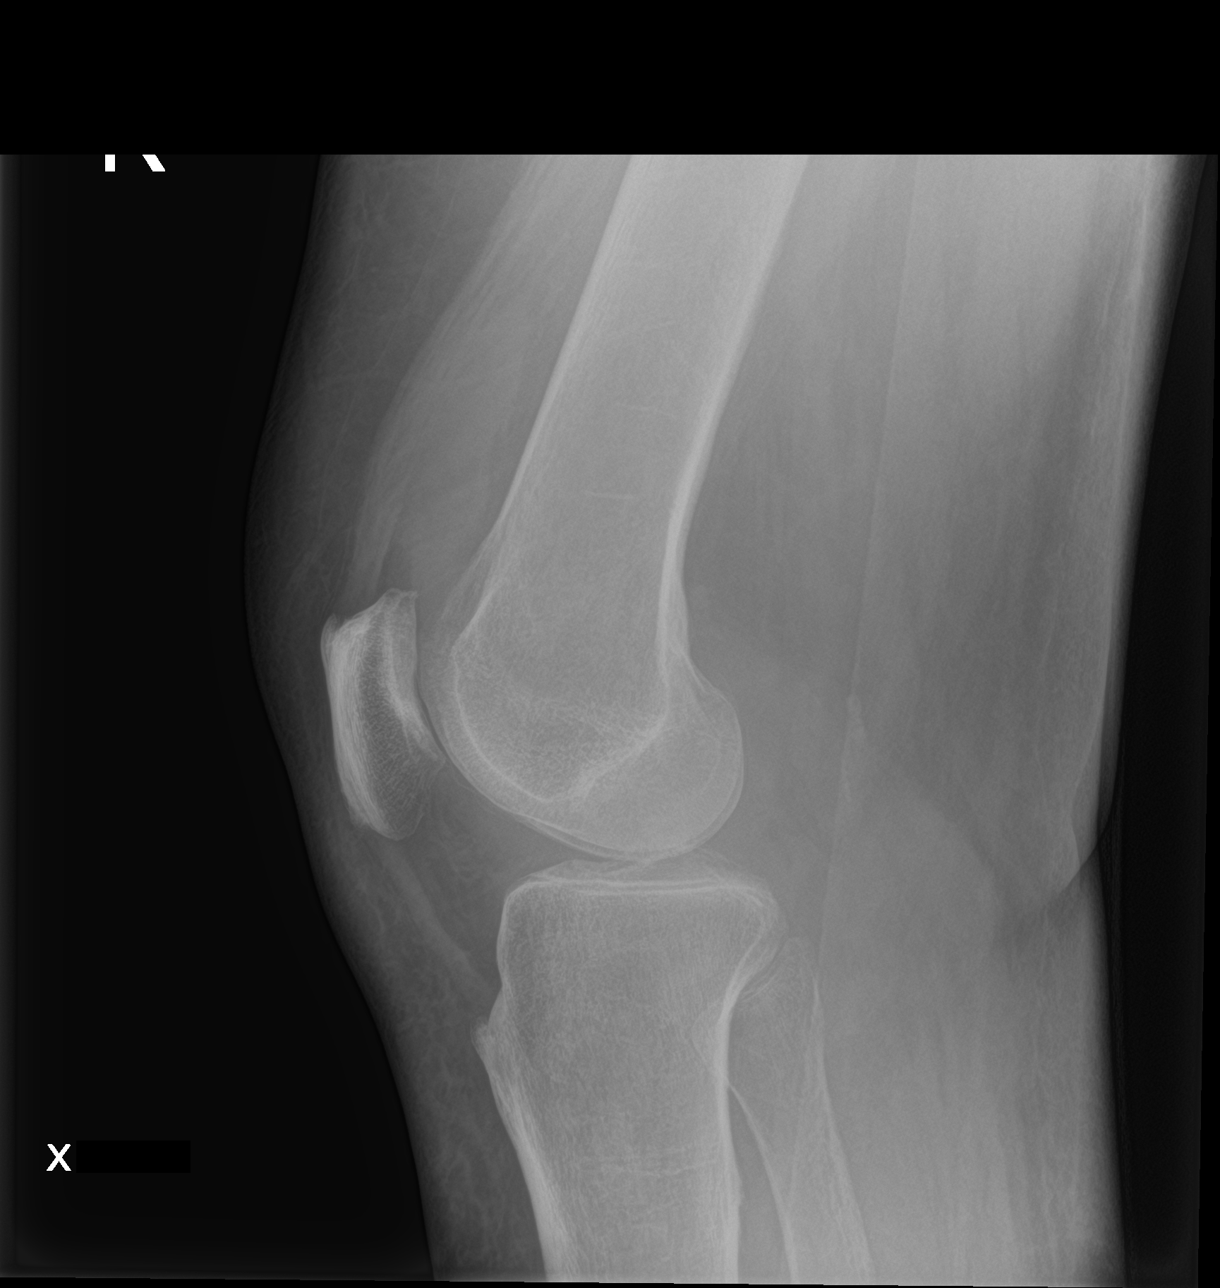

[4 of 4 positions shown; findings below may reference images not displayed]

FINDINGS: Alignment is anatomic. There is a joint effusion. Changes of
osteoarthritis with joint space narrowing primarily medially. No
acute fracture.
IMPRESSION: Osteoarthritis, greatest in the medial compartment.  Joint effusion.

## 2020-10-28 ENCOUNTER — Other Ambulatory Visit (HOSPITAL_BASED_OUTPATIENT_CLINIC_OR_DEPARTMENT_OTHER): Payer: Self-pay

## 2020-10-29 ENCOUNTER — Other Ambulatory Visit (HOSPITAL_BASED_OUTPATIENT_CLINIC_OR_DEPARTMENT_OTHER): Payer: Self-pay

## 2020-11-14 ENCOUNTER — Other Ambulatory Visit: Payer: Self-pay

## 2020-11-14 ENCOUNTER — Ambulatory Visit (HOSPITAL_COMMUNITY)
Admission: RE | Admit: 2020-11-14 | Discharge: 2020-11-14 | Disposition: A | Discharge status: Home / Self Care | Payer: 59 | Source: Ambulatory Visit | Attending: Student | Admitting: Student

## 2020-11-14 DIAGNOSIS — I5042 Chronic combined systolic (congestive) and diastolic (congestive) heart failure: Secondary | ICD-10-CM | POA: Diagnosis present

## 2020-11-14 LAB — ECHOCARDIOGRAM LIMITED
Area-P 1/2: 2.69 cm2
Calc EF: 55.3 %
S' Lateral: 3.4 cm
Single Plane A2C EF: 50.7 %
Single Plane A4C EF: 54.8 %

## 2020-11-14 NOTE — Progress Notes (Signed)
*  PRELIMINARY RESULTS* Echocardiogram Limited 2-D Echocardiogram  has been performed.  Kristine Montgomery 11/14/2020, 10:09 AM

## 2020-11-20 NOTE — Progress Notes (Signed)
Cardiology Office Note    Date:  11/21/2020   ID:  Kristine Montgomery, DOB 01-Jul-1965, MRN [id number]  PCP:  Elfredia NevinsFusco, Lawrence, MD  Cardiologist: Dietrich PatesPaula Ross, MD    Chief Complaint  Patient presents with   Follow-up    3 month visit    History of Present Illness:    Kristine Montgomery is a 55 y.o. female with past medical history of CAD (s/p NSTEMI in 07/2020 with DES to prox-LAD and DES to prox-LCx and medical management recommended of occluded RCA), HFrEF (EF 30% by echo in 07/2020), HTN, HLD and prior tobacco use who presents to the office today for 6430-month follow-up.  She was last examined by myself in 08/2020 following her recent hospitalization and was overall doing well at the time of her visit. She had noticed improvement in her breathing and denied any exertional chest pain or palpitations. She was continued on ASA, Brilinta, Toprol-XL, Atorvastatin, Farxiga, Lasix and Entresto with Spironolactone being titrated to 25 mg daily. Her repeat limited echocardiogram was obtained on 11/14/2020 and showed that her EF had significantly improved to 55 to 60%.  In talking with the patient today, she reports overall doing well since her last office visit. She has made significant dietary changes and has lost between 30 to 40 pounds since her hospitalization in 07/2020. She has been following a low-sodium diet and limiting her intake of carbohydrates as well. She reports her breathing has significantly improved and denies any orthopnea, PND or lower extremity edema. No recent palpitations or exertional chest pain. She does experience an intermittent mild burning sensation along the bottom of her feet.   She has not smoked a cigarette since her MI.   Past Medical History:  Diagnosis Date   Acute combined systolic and diastolic CHF, NYHA class 4 (HCC) 07/29/2020   a. EF 30% by echo in 07/2020 b. EF 55-60% by echo in 11/2020   Arthritis    Asthma    CAD (coronary artery disease)    a. 07/2020: s/p  NSTEMI with DES to proxLAD and DES to proxLCx with medical management recommended of the occluded RCA.   Chronic bronchitis (HCC)    COPD (chronic obstructive pulmonary disease) (HCC)    Edema extremities    History of hiatal hernia    Hyperlipidemia LDL goal <70 07/29/2020   Ovarian cyst    Pneumonia     Past Surgical History:  Procedure Laterality Date   CESAREAN SECTION     CORONARY STENT INTERVENTION N/A 07/29/2020   Procedure: CORONARY STENT INTERVENTION;  Surgeon: Lennette BihariKelly, Thomas A, MD;  Location: [facility];  Service: Cardiovascular;  Laterality: N/A;   KNEE ARTHROSCOPY WITH MEDIAL MENISECTOMY Right 05/23/2020   Procedure: KNEE ARTHROSCOPY WITH MEDIAL AND LATERAL MENISECTOMY; 3 COMPARTMENT SYNOVECTOMY;  Surgeon: Vickki HearingHarrison, Stanley E, MD;  Location: [facility];  Service: Orthopedics;  Laterality: Right;   RIGHT/LEFT HEART CATH AND CORONARY ANGIOGRAPHY N/A 07/29/2020   Procedure: RIGHT/LEFT HEART CATH AND CORONARY ANGIOGRAPHY;  Surgeon: Lennette BihariKelly, Thomas A, MD;  Location: [facility];  Service: Cardiovascular;  Laterality: N/A;   TONSILLECTOMY     TUBAL LIGATION      Current Medications: Outpatient Medications Prior to Visit  Medication Sig Dispense Refill   aspirin EC 81 MG tablet Take 1 tablet (81 mg total) by mouth daily. Swallow whole. 30 tablet 11   atorvastatin (LIPITOR) 80 MG tablet TAKE 1 TABLET (80 MG TOTAL) BY MOUTH DAILY. 30 tablet 6  dapagliflozin propanediol (FARXIGA) 10 MG TABS tablet TAKE 1 TABLET (10 MG TOTAL) BY MOUTH DAILY. 30 tablet 6   furosemide (LASIX) 20 MG tablet TAKE 1 TABLET (20 MG TOTAL) BY MOUTH DAILY. TAKE AN ADDITIONAL 1 TABLET FOR 3 POUND WEIGHT GAIN OVERNIGHT OR 5 POUNDS IN 1 WEEK 30 tablet 6   metoprolol succinate (TOPROL-XL) 25 MG 24 hr tablet TAKE 1 TABLET (25 MG TOTAL) BY MOUTH DAILY. 30 tablet 6   nitroGLYCERIN (NITROSTAT) 0.4 MG SL tablet PLACE 1 TABLET (0.4 MG TOTAL) UNDER THE TONGUE EVERY FIVE MINUTES X 3 DOSES AS NEEDED FOR CHEST PAIN. 25  tablet 12   sacubitril-valsartan (ENTRESTO) 24-26 MG TAKE 1 TABLET BY MOUTH TWO TIMES DAILY. 60 tablet 11   spironolactone (ALDACTONE) 25 MG tablet TAKE 1/2 TABLET (12.5 MG TOTAL) BY MOUTH DAILY. 30 tablet 6   ticagrelor (BRILINTA) 90 MG TABS tablet TAKE 1 TABLET (90 MG TOTAL) BY MOUTH TWO TIMES DAILY. 60 tablet 11   nitroGLYCERIN (NITROSTAT) 0.4 MG SL tablet Place 1 tablet (0.4 mg total) under the tongue every 5 (five) minutes x 3 doses as needed for chest pain. (Patient not taking: Reported on 11/21/2020) 25 tablet 12   aspirin 81 MG EC tablet TAKE 1 TABLET (81 MG TOTAL) BY MOUTH DAILY. SWALLOW WHOLE. 30 tablet 11   atorvastatin (LIPITOR) 80 MG tablet Take 1 tablet (80 mg total) by mouth daily. 30 tablet 6   dapagliflozin propanediol (FARXIGA) 10 MG TABS tablet Take 1 tablet (10 mg total) by mouth daily. 30 tablet 6   furosemide (LASIX) 20 MG tablet Take 1 tablet (20 mg total) by mouth daily. Take an additional 1 tablet for 3 pound weight gain overnight or 5 pounds in 1 week (Patient not taking: Reported on 11/21/2020) 30 tablet 6   metoprolol succinate (TOPROL-XL) 25 MG 24 hr tablet Take 1 tablet (25 mg total) by mouth daily. (Patient not taking: Reported on 11/21/2020) 30 tablet 6   sacubitril-valsartan (ENTRESTO) 24-26 MG Take 1 tablet by mouth 2 (two) times daily. (Patient not taking: Reported on 11/21/2020) 60 tablet 11   spironolactone (ALDACTONE) 25 MG tablet Take 1 tablet (25 mg total) by mouth daily. (Patient not taking: Reported on 11/21/2020) 90 tablet 3   ticagrelor (BRILINTA) 90 MG TABS tablet Take 1 tablet (90 mg total) by mouth 2 (two) times daily. (Patient not taking: Reported on 11/21/2020) 60 tablet 11   No facility-administered medications prior to visit.     Allergies:   Patient has no known allergies.   Social History   Socioeconomic History   Marital status: Single    Spouse name: Not on file   Number of children: 2   Years of education: 12   Highest education level: Not on file   Occupational History   Not on file  Tobacco Use   Smoking status: Former    Packs/day: 0.50    Years: 30.00    Pack years: 15.00    Types: Cigarettes   Smokeless tobacco: Never   Tobacco comments:    pt states last cigarette was 3/13, prior to coming into hospital. Pt plans to continue cessation.  Vaping Use   Vaping Use: Never used  Substance and Sexual Activity   Alcohol use: Yes    Comment: occasional   Drug use: No   Sexual activity: Yes    Birth control/protection: Post-menopausal, Surgical    Comment: tubal  Other Topics Concern   Not on file  Social History Narrative  Not on file   Social Determinants of Health   Financial Resource Strain: Low Risk    Difficulty of Paying Living Expenses: Not very hard  Food Insecurity: No Food Insecurity   Worried About Running Out of Food in the Last Year: Never true   Ran Out of Food in the Last Year: Never true  Transportation Needs: No Transportation Needs   Lack of Transportation (Medical): No   Lack of Transportation (Non-Medical): No  Physical Activity: Insufficiently Active   Days of Exercise per Week: 1 day   Minutes of Exercise per Session: 10 min  Stress: No Stress Concern Present   Feeling of Stress : Not at all  Social Connections: Socially Isolated   Frequency of Communication with Friends and Family: More than three times a week   Frequency of Social Gatherings with Friends and Family: More than three times a week   Attends Religious Services: Never   Database administrator or Organizations: No   Attends Banker Meetings: Never   Marital Status: Widowed     Family History:  The patient's family history includes COPD in her father; Cancer in her maternal grandmother, mother, and paternal grandfather; Congestive Heart Failure in her mother; Diabetes in her mother; Hypertension in her sister; Non-Hodgkin's lymphoma in her mother; Other in her son.   Review of Systems:    Please see the history  of present illness.     All other systems reviewed and are otherwise negative except as noted above.   Physical Exam:    VS:  BP 128/84   Pulse 74   Ht  (1.549 m)   Wt 246 lb (111.6 kg)   LMP 06/27/2017 (Within Weeks)   SpO2 96%   BMI 46.48 kg/m    General: Well developed, well nourished,female appearing in no acute distress. Head: Normocephalic, atraumatic. Neck: No carotid bruits. JVD not elevated.  Lungs: Respirations regular and unlabored, without wheezes or rales.  Heart: Regular rate and rhythm. No S3 or S4.  No murmur, no rubs, or gallops appreciated. Abdomen: Appears non-distended. No obvious abdominal masses. Msk:  Strength and tone appear normal for age. No obvious joint deformities or effusions. Extremities: No clubbing or cyanosis. No pitting edema.  Distal pedal pulses are 2+ bilaterally. Neuro: Alert and oriented X 3. Moves all extremities spontaneously. No focal deficits noted. Psych:  Responds to questions appropriately with a normal affect. Skin: No rashes or lesions noted  Wt Readings from Last 3 Encounters:  11/21/20 246 lb (111.6 kg)  08/15/20 261 lb (118.4 kg)  08/05/20 263 lb 9.6 oz (119.6 kg)        Studies/Labs Reviewed:   EKG:  EKG is not ordered today.   Recent Labs: 07/28/2020: ALT 15; B Natriuretic Peptide 200.0 07/31/2020: Hemoglobin 11.7; Platelets 279 08/05/2020: BUN 18; Creatinine, Ser 0.83; Potassium 3.4; Sodium 132   Lipid Panel    Component Value Date/Time   CHOL 211 (H) 07/29/2020 0236   TRIG 186 (H) 07/29/2020 0236   HDL 22 (L) 07/29/2020 0236   CHOLHDL 9.6 07/29/2020 0236   VLDL 37 07/29/2020 0236   LDLCALC 152 (H) 07/29/2020 0236    Additional studies/ records that were reviewed today include:   Echocardiogram: 07/2020 IMPRESSIONS     1. Poor acoustic windows limit study Definity used. Overall LVEF is  approximately 30% with diffuse hypokinesis worse in the septal, mid/distal  anterior, distal inferior, distal  lateral and apical walls. . The left  ventricular internal cavity size was  mildly to moderately dilated. Left ventricular diastolic parameters are  indeterminate.   2. Right ventricular systolic function is normal. The right ventricular  size is normal.   3. Mild mitral valve regurgitation.   4. The aortic valve is tricuspid. Aortic valve regurgitation is not  visualized. Mild aortic valve sclerosis is present, with no evidence of  aortic valve stenosis.   5. The inferior vena cava is dilated in size with >50% respiratory  variability, suggesting right atrial pressure of 8 mmHg.   Cardiac Catheterization: 07/2020 Prox LAD lesion is 95% stenosed. Ramus lesion is 40% stenosed. Ost LAD lesion is 20% stenosed. Ost Cx lesion is 30% stenosed. Prox Cx lesion is 95% stenosed. Prox RCA to Mid RCA lesion is 100% stenosed. Post intervention, there is a 0% residual stenosis. Post intervention, there is a 0% residual stenosis. A stent was successfully placed. A stent was successfully placed.   Severe multivessel CAD with 30% smooth ostial narrowing of the LAD followed by focal 95% eccentric proximal stenosis; smooth 40% ostial narrowing of a ramus intermediate vessel; 30% ostial narrowing of the circumflex followed by 95% proximal stenosis on a bend in the proximal circumflex; and total Lee occluded proximal RCA with both bridging antegrade collaterals as well as retrograde collaterals supplying the distal RCA.   Moderately elevated right heart pressures with very hypertension with a mean PA pressure at 41 mm.   Successful two-vessel coronary intervention involving a 95% proximal LAD stenosis with insertion of a 3.5 x 15 mm Resolute Onyx stent postdilated to 3.75 mm and 95% proximal left circumflex stenosis with insertion of a 3.0 x 15 mm Resolute Onyx stent postdilated to 3.3 mm with the stenoses being reduced to 0%.   RECOMMENDATION: DAPT with aspirin/Brilinta for minimum of 1 year.   Bivalirudin will be discontinued upon completion of the current bag with sheath pull several hours later.  Patient was given furosemide 40 mg IV at the completion of the procedure.  She will be started on carvedilol 3.125 mg twice a day and either ARB or Entresto tomorrow.  Hopefully LV function will improve with revascularization of both the LAD and circumflex vessels.   Echocardiogram: 11/2020 IMPRESSIONS     1. Limited study.   2. Left ventricular ejection fraction, by estimation, is 55 to 60%. The  left ventricle has normal function. The left ventricle demonstrates  regional wall motion abnormalities (see scoring diagram/findings for  description).   3. Right ventricular systolic function is normal. The right ventricular  size is normal.   4. A small pericardial effusion is present. The pericardial effusion is  circumferential.   5. The inferior vena cava is normal in size with greater than 50%  respiratory variability, suggesting right atrial pressure of 3 mmHg.   Assessment:    1. Coronary artery disease involving native coronary artery of native heart without angina pectoris   2. Chronic combined systolic and diastolic congestive heart failure (HCC)   3. Essential hypertension   4. Hyperlipidemia LDL goal <70      Plan:   In order of problems listed above:  1. CAD - She is s/p NSTEMI in 07/2020 with DES to prox-LAD and DES to prox-LCx and medical management recommended of occluded RCA.  - She denies any recent anginal symptoms. - Continue ASA, Brilinta, Atorvastatin and Toprol-XL.   2. HFrEF - Her EF was previously at 30% by echo in 07/2020, normalized to 55 to 60% by repeat  imaging earlier this month.  - She denies any recent orthopnea, PND or lower extremity edema. Appears euvolemic by examination today. She was congratulated on her weight loss in the setting of significant dietary changes. She was concerned the burning along her feet might be related to CHF and I  reviewed with her today symptoms seem most consistent with neuropathy and I recommended she follow-up with her PCP. Atypical for claudication and she has good distal pulses on exam. - Continue current medical therapy with Lasix, Toprol-XL, Entresto, Spironolactone and Farxiga at current dosing.  3. HTN - BP remains well controlled at 128/84 during today's visit. Continue current medication regimen.  4. HLD - LDL was elevated to 152 during her admission in 07/2020. She is due for repeat FLP and LFT's and these have been ordered but not yet obtained. Reviewed with the patient and she will plan to have labs next week. Remains on Atorvastatin 80mg  daily.     Medication Adjustments/Labs and Tests Ordered: Current medicines are reviewed at length with the patient today.  Concerns regarding medicines are outlined above.  Medication changes, Labs and Tests ordered today are listed in the Patient Instructions below. Patient Instructions  Medication Instructions:  Your physician recommends that you continue on your current medications as directed. Please refer to the Current Medication list given to you today.  *If you need a refill on your cardiac medications before your next appointment, please call your pharmacy*   Lab Work: Get lab work next week, orders are in computer  If you have labs (blood work) drawn today and your tests are completely normal, you will receive your results only by: MyChart Message (if you have MyChart) OR A paper copy in the mail If you have any lab test that is abnormal or we need to change your treatment, we will call you to review the results.   Testing/Procedures: None today    Follow-Up: At [facility], you and your health needs are our priority.  As part of our continuing mission to provide you with exceptional heart care, we have created designated Provider Care Teams.  These Care Teams include your primary Cardiologist (physician) and Advanced Practice  Providers (APPs -  Physician Assistants and Nurse Practitioners) who all work together to provide you with the care you need, when you need it.  We recommend signing up for the patient portal called "MyChart".  Sign up information is provided on this After Visit Summary.  MyChart is used to connect with patients for Virtual Visits (Telemedicine).  Patients are able to view lab/test results, encounter notes, upcoming appointments, etc.  Non-urgent messages can be sent to your provider as well.   To learn more about what you can do with MyChart, go to [facility].    Your next appointment:   6 month(s)  The format for your next appointment:   In Person  Provider:   [email or web address], MD   Other Instructions None     Signed, Dietrich Pates, PA-C  11/21/2020 1:56 PM    [facility] [address] [place], [place] Phone: [phone] Fax: [phone]

## 2020-11-21 ENCOUNTER — Encounter: Payer: Self-pay | Admitting: Student

## 2020-11-21 ENCOUNTER — Ambulatory Visit (INDEPENDENT_AMBULATORY_CARE_PROVIDER_SITE_OTHER): Payer: 59 | Admitting: Student

## 2020-11-21 ENCOUNTER — Other Ambulatory Visit: Payer: Self-pay

## 2020-11-21 VITALS — BP 128/84 | HR 74 | Ht 61.0 in | Wt 246.0 lb

## 2020-11-21 DIAGNOSIS — E785 Hyperlipidemia, unspecified: Secondary | ICD-10-CM | POA: Diagnosis not present

## 2020-11-21 DIAGNOSIS — I10 Essential (primary) hypertension: Secondary | ICD-10-CM

## 2020-11-21 DIAGNOSIS — I5042 Chronic combined systolic (congestive) and diastolic (congestive) heart failure: Secondary | ICD-10-CM | POA: Diagnosis not present

## 2020-11-21 DIAGNOSIS — I2510 Atherosclerotic heart disease of native coronary artery without angina pectoris: Secondary | ICD-10-CM | POA: Diagnosis not present

## 2020-11-21 NOTE — Patient Instructions (Signed)
Medication Instructions:  Your physician recommends that you continue on your current medications as directed. Please refer to the Current Medication list given to you today.  *If you need a refill on your cardiac medications before your next appointment, please call your pharmacy*   Lab Work: Get lab work next week, orders are in computer  If you have labs (blood work) drawn today and your tests are completely normal, you will receive your results only by: MyChart Message (if you have MyChart) OR A paper copy in the mail If you have any lab test that is abnormal or we need to change your treatment, we will call you to review the results.   Testing/Procedures: None today    Follow-Up: At [facility], you and your health needs are our priority.  As part of our continuing mission to provide you with exceptional heart care, we have created designated Provider Care Teams.  These Care Teams include your primary Cardiologist (physician) and Advanced Practice Providers (APPs -  Physician Assistants and Nurse Practitioners) who all work together to provide you with the care you need, when you need it.  We recommend signing up for the patient portal called "MyChart".  Sign up information is provided on this After Visit Summary.  MyChart is used to connect with patients for Virtual Visits (Telemedicine).  Patients are able to view lab/test results, encounter notes, upcoming appointments, etc.  Non-urgent messages can be sent to your provider as well.   To learn more about what you can do with MyChart, go to [email or web address].    Your next appointment:   6 month(s)  The format for your next appointment:   In Person  Provider:   Dietrich Pates, MD   Other Instructions None

## 2020-12-24 ENCOUNTER — Other Ambulatory Visit: Payer: Self-pay | Admitting: Physician Assistant

## 2021-01-21 ENCOUNTER — Ambulatory Visit: Payer: 59 | Admitting: Orthopedic Surgery

## 2021-01-21 DIAGNOSIS — Z9889 Other specified postprocedural states: Secondary | ICD-10-CM

## 2021-01-22 ENCOUNTER — Telehealth: Payer: Self-pay | Admitting: Student

## 2021-01-22 ENCOUNTER — Ambulatory Visit: Payer: 59 | Admitting: Orthopedic Surgery

## 2021-01-22 NOTE — Telephone Encounter (Signed)
Patient informed and verbalized understanding of plan. 

## 2021-01-22 NOTE — Telephone Encounter (Signed)
Patient should avoid any products with pseudoephedrine or phenylephrine in it. She can use tylenol for headaches/fever. I recommend a saline nasal rinse for congestion. Can also use afrin nasal spray for 3 days.

## 2021-01-22 NOTE — Telephone Encounter (Signed)
New message     Patient called she has a head cold , wants to know what over the counter medication she can take that will not react with her heart medication

## 2021-01-23 ENCOUNTER — Other Ambulatory Visit: Payer: Self-pay | Admitting: Physician Assistant

## 2021-01-29 ENCOUNTER — Other Ambulatory Visit: Payer: Self-pay

## 2021-01-29 ENCOUNTER — Encounter: Payer: Self-pay | Admitting: Orthopedic Surgery

## 2021-01-29 ENCOUNTER — Ambulatory Visit (INDEPENDENT_AMBULATORY_CARE_PROVIDER_SITE_OTHER): Payer: 59 | Admitting: Orthopedic Surgery

## 2021-01-29 ENCOUNTER — Other Ambulatory Visit: Payer: Self-pay | Admitting: Orthopedic Surgery

## 2021-01-29 ENCOUNTER — Ambulatory Visit: Payer: Medicaid Other

## 2021-01-29 VITALS — BP 107/77 | HR 89 | Ht 61.0 in | Wt 238.6 lb

## 2021-01-29 DIAGNOSIS — M1711 Unilateral primary osteoarthritis, right knee: Secondary | ICD-10-CM

## 2021-01-29 DIAGNOSIS — Z9889 Other specified postprocedural states: Secondary | ICD-10-CM | POA: Diagnosis not present

## 2021-01-29 DIAGNOSIS — G8929 Other chronic pain: Secondary | ICD-10-CM

## 2021-01-29 DIAGNOSIS — M25561 Pain in right knee: Secondary | ICD-10-CM

## 2021-01-29 NOTE — Progress Notes (Signed)
Chief Complaint  Patient presents with   Knee Pain    Right knee severe pain, has not been able to WB without pain since surgery in January    55 year old female now on Brilinta anticoagulant status post stent placement who had an arthroscopy of the right knee medial lateral meniscectomy was thought to have some type of inflammatory arthritic process as the arthroscopy revealed of lots of debris and inflammation  Prior to that she had not been standing regularly for about 6 months this persisted even after her surgery January 2022  After that she had an MI July 28, 2020 had stents placed on 07/29/2020 and because of the heart attack did not come back for any follow-ups until today  She complains of burning pain anteriorly medially laterally in the right knee and she says she still cannot stand up cannot take a shower her knees are basically stuck in a flexed position  BP 107/77   Pulse 89   Ht 5\' 1"  (1.549 m)   Wt 238 lb 9.6 oz (108.2 kg)   LMP 06/27/2017 (Within Weeks)   BMI 45.08 kg/m   Examination shows that she has bilateral knee flexion contractures with right side worse than the left and right knee active extension 30 degree extension loss knee flexion approaches 90 degrees appears to have an effusion  She does use a cane to stand and uses a wheelchair to ambulate  I repeated her x-ray she has a varus mild deformity of the right knee shows 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  She is diabetic we do not have an updated hemoglobin's A1c last 1 was about 7  2.  She is still overweight but did lose 40 pounds so that an impediment to surgery  3.  She just had an MI would wait a full year before taking off her Brilinta and that would need a cardiology consult  4. her surgery is going to be very difficult because of the flexion contracture and the longstanding weakness and lack of ambulation  I will see him  in a year we will get a preop A1c cardiology consult  I injected her right knee today sent her for physical therapy  A steroid injection was performed at right knee using 1% plain Lidocaine and 6 mg of Celestone. This was well tolerated.

## 2021-03-08 ENCOUNTER — Emergency Department (HOSPITAL_COMMUNITY): Payer: 59

## 2021-03-08 ENCOUNTER — Other Ambulatory Visit: Payer: Self-pay

## 2021-03-08 ENCOUNTER — Encounter (HOSPITAL_COMMUNITY): Payer: Self-pay

## 2021-03-08 ENCOUNTER — Emergency Department (HOSPITAL_COMMUNITY)
Admission: EM | Admit: 2021-03-08 | Discharge: 2021-03-08 | Disposition: A | Discharge status: Home / Self Care | Payer: 59 | Attending: Emergency Medicine | Admitting: Emergency Medicine

## 2021-03-08 DIAGNOSIS — I2510 Atherosclerotic heart disease of native coronary artery without angina pectoris: Secondary | ICD-10-CM | POA: Insufficient documentation

## 2021-03-08 DIAGNOSIS — M7989 Other specified soft tissue disorders: Secondary | ICD-10-CM | POA: Diagnosis not present

## 2021-03-08 DIAGNOSIS — Z87891 Personal history of nicotine dependence: Secondary | ICD-10-CM | POA: Diagnosis not present

## 2021-03-08 DIAGNOSIS — J449 Chronic obstructive pulmonary disease, unspecified: Secondary | ICD-10-CM | POA: Diagnosis not present

## 2021-03-08 DIAGNOSIS — Z79899 Other long term (current) drug therapy: Secondary | ICD-10-CM | POA: Diagnosis not present

## 2021-03-08 DIAGNOSIS — Z7982 Long term (current) use of aspirin: Secondary | ICD-10-CM | POA: Insufficient documentation

## 2021-03-08 DIAGNOSIS — J45909 Unspecified asthma, uncomplicated: Secondary | ICD-10-CM | POA: Diagnosis not present

## 2021-03-08 DIAGNOSIS — M25472 Effusion, left ankle: Secondary | ICD-10-CM

## 2021-03-08 DIAGNOSIS — I5041 Acute combined systolic (congestive) and diastolic (congestive) heart failure: Secondary | ICD-10-CM | POA: Insufficient documentation

## 2021-03-08 MED ORDER — IBUPROFEN 400 MG PO TABS
600.0000 mg | ORAL_TABLET | Freq: Once | ORAL | Status: AC
  Administered 2021-03-08: 600 mg via ORAL
  Filled 2021-03-08: qty 2

## 2021-03-08 MED ORDER — FUROSEMIDE 40 MG PO TABS
20.0000 mg | ORAL_TABLET | Freq: Once | ORAL | Status: AC
  Administered 2021-03-08: 20 mg via ORAL
  Filled 2021-03-08: qty 1

## 2021-03-08 NOTE — Discharge Instructions (Signed)
You were seen today for left foot and ankle swelling.  Your x-rays are reassuring.  Keep elevated.  You were given an additional dose of Lasix to see if this helps at all.  Return later today for an ultrasound of the left leg to ensure no blood clot.  Another consideration would be an inflammatory arthritis such as gout.  Avoid alcohol and pork products.  Follow-up closely with your primary doctor.

## 2021-03-08 NOTE — ED Provider Notes (Signed)
[name] EMERGENCY DEPARTMENT Provider Note   CSN: [id number] Arrival date & time: 03/08/21  0022     History Chief Complaint  Patient presents with   Foot Pain    Kristine Montgomery is a 55 y.o. female.  HPI     This is a 55 year old female with a history of coronary artery disease, CHF, COPD who presents with left foot and ankle swelling.  Patient reports 3 day history of worsening left greater than right ankle swelling and pain.  Kristine Montgomery states Kristine Montgomery has a similar history in the past related to her heart failure; however, states that normally it is more bilateral.  Kristine Montgomery states significant pain with ambulation.  Rates her pain at 9 out of 10.  Kristine Montgomery is not taking anything for pain.  Denies any history of fevers.  No history of gout or inflammatory arthritis.  Denies injury.  Denies chest pain or shortness of breath.  Past Medical History:  Diagnosis Date   Acute combined systolic and diastolic CHF, NYHA class 4 (HCC) 07/29/2020   a. EF 30% by echo in 07/2020 b. EF 55-60% by echo in 11/2020   Arthritis    Asthma    CAD (coronary artery disease)    a. 07/2020: s/p NSTEMI with DES to proxLAD and DES to proxLCx with medical management recommended of the occluded RCA.   Chronic bronchitis (HCC)    COPD (chronic obstructive pulmonary disease) (HCC)    Edema extremities    History of hiatal hernia    Hyperlipidemia LDL goal <70 07/29/2020   Ovarian cyst    Pneumonia     Patient Active Problem List   Diagnosis Date Noted   Ischemic cardiomyopathy 07/31/2020   Coronary artery disease involving native coronary artery of native heart with unstable angina pectoris (HCC) 07/31/2020   Nocturnal hypoxemia due to obesity 07/31/2020   Morbid obesity (HCC) 07/31/2020   Tobacco abuse 07/31/2020   Acute combined systolic and diastolic CHF, NYHA class 4 (HCC) 07/29/2020   Hyperlipidemia LDL goal <70 07/29/2020   NSTEMI (non-ST elevated myocardial infarction) (HCC) 07/29/2020   Unstable angina  (HCC) 07/28/2020   S/P right knee arthroscopy 05/23/20 05/28/2020   Tear of meniscus of knee joint    Synovitis of knee    Primary osteoarthritis of right knee    Encounter for screening fecal occult blood testing 04/09/2020   Encounter for gynecological examination with Papanicolaou smear of cervix 04/09/2020   Urinary tract infection without hematuria 02/27/2020   Dysuria 02/27/2020   Urge incontinence 04/10/2018   OAB (overactive bladder) 04/10/2018   Urinary frequency 04/10/2018   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 07/15/2015    Past Surgical History:  Procedure Laterality Date   CESAREAN SECTION     CORONARY STENT INTERVENTION N/A 07/29/2020   Procedure: CORONARY STENT INTERVENTION;  Surgeon: Lennette Bihari, MD;  Location: [facility] INVASIVE CV LAB;  Service: Cardiovascular;  Laterality: N/A;   KNEE ARTHROSCOPY WITH MEDIAL MENISECTOMY Right 05/23/2020   Procedure: KNEE ARTHROSCOPY WITH MEDIAL AND LATERAL MENISECTOMY; 3 COMPARTMENT SYNOVECTOMY;  Surgeon: Vickki Hearing, MD;  Location: [facility] OR[facility];  Service: Orthopedics;  Laterality: Right;   RIGHT/LEFT HEART CATH AND CORONARY ANGIOGRAPHY N/A 07/29/2020   Procedure: RIGHT/LEFT HEART CATH AND CORONARY ANGIOGRAPHY;  Surgeon: Lennette Bihari, MD;  Location: [facility] INVASIVE CV LAB;  Service: Cardiovascular;  Laterality: N/A;   TONSILLECTOMY     TUBAL LIGATION       OB History  Gravida  1   Para  1   Term  1   Preterm      AB      Living  2      SAB      IAB      Ectopic      Multiple  1   Live Births  2           Family History  Problem Relation Age of Onset   Cancer Paternal Grandfather        colon   Cancer Maternal Grandmother        lung   COPD Father    Non-Hodgkin's lymphoma Mother    Diabetes Mother    Cancer Mother        cervical   Congestive Heart Failure Mother    Hypertension Sister    Other Son        fluid around heart    Social History   Tobacco Use   Smoking status: Former     Packs/day: 0.50    Years: 30.00    Pack years: 15.00    Types: Cigarettes   Smokeless tobacco: Never   Tobacco comments:    pt states last cigarette was 3/13, prior to coming into hospital. Pt plans to continue cessation.  Vaping Use   Vaping Use: Never used  Substance Use Topics   Alcohol use: Not Currently    Comment: occasional   Drug use: No    Home Medications Prior to Admission medications   Medication Sig Start Date End Date Taking? Authorizing Provider  aspirin EC 81 MG tablet Take 1 tablet (81 mg total) by mouth daily. Swallow whole. 07/31/20   Bhagat, Sharrell Ku, PA  atorvastatin (LIPITOR) 80 MG tablet TAKE (1) TABLET BY MOUTH ONCE DAILY. 01/23/21   Bhagat, Bhavinkumar, PA  FARXIGA 10 MG TABS tablet TAKE 1 TABLET BY MOUTH ONCE A DAY. 01/23/21   Bhagat, Bhavinkumar, PA  furosemide (LASIX) 20 MG tablet TAKE (1) TABLET BY MOUTH ONCE DAILY. TAKE AN ADDITIONAL 1 TABLET FOR 3 POUND WEIGHT GAIN OVERNIGHT OR 5 POUNDS IN A WEEK. 12/24/20   Strader, [place] M, PA-C  metoprolol succinate (TOPROL-XL) 25 MG 24 hr tablet TAKE 1 TABLET BY MOUTH ONCE DAILY 01/23/21   Bhagat, Bhavinkumar, PA  nitroGLYCERIN (NITROSTAT) 0.4 MG SL tablet Place 1 tablet (0.4 mg total) under the tongue every 5 (five) minutes x 3 doses as needed for chest pain. Patient not taking: Reported on 11/21/2020 07/31/20   Manson Passey, PA  nitroGLYCERIN (NITROSTAT) 0.4 MG SL tablet PLACE 1 TABLET (0.4 MG TOTAL) UNDER THE TONGUE EVERY FIVE MINUTES X 3 DOSES AS NEEDED FOR CHEST PAIN. 07/31/20 07/31/21  Bhagat, Sharrell Ku, PA  sacubitril-valsartan (ENTRESTO) 24-26 MG TAKE 1 TABLET BY MOUTH TWO TIMES DAILY. 07/31/20 07/31/21  Manson Passey, PA  spironolactone (ALDACTONE) 25 MG tablet TAKE 1/2 TABLET (12.5 MG TOTAL) BY MOUTH DAILY. 07/31/20 07/31/21  Manson Passey, PA  ticagrelor (BRILINTA) 90 MG TABS tablet TAKE 1 TABLET (90 MG TOTAL) BY MOUTH TWO TIMES DAILY. 07/31/20 07/31/21  Manson Passey, PA    Allergies     Patient has no known allergies.  Review of Systems   Review of Systems  Constitutional:  Negative for fever.  Respiratory:  Negative for shortness of breath.   Cardiovascular:  Positive for leg swelling. Negative for chest pain.  Musculoskeletal:        Left foot and ankle swelling  All other systems reviewed and are  negative.  Physical Exam Updated Vital Signs BP 118/71 (BP Location: Right Arm)   Pulse 67   Temp 98 F (36.7 C) (Oral)   Ht 1.549 m (5\' 1" )   Wt 108 kg   LMP 06/27/2017 (Within Weeks)   SpO2 97%   BMI 44.97 kg/m   Physical Exam Vitals and nursing note reviewed.  Constitutional:      Appearance: Kristine Montgomery is well-developed. Kristine Montgomery is obese. Kristine Montgomery is not ill-appearing.  HENT:     Head: Normocephalic and atraumatic.     Nose: Nose normal.     Mouth/Throat:     Mouth: Mucous membranes are moist.  Eyes:     Pupils: Pupils are equal, round, and reactive to light.  Cardiovascular:     Rate and Rhythm: Normal rate and regular rhythm.  Pulmonary:     Effort: Pulmonary effort is normal. No respiratory distress.  Abdominal:     Palpations: Abdomen is soft.  Musculoskeletal:     Cervical back: Neck supple.     Comments: 2+ edema noted of the left ankle and foot, most significantly over the medial aspect, pitting, no overlying skin changes, normal range of motion of the ankle, 1+ edema of the right ankle  Skin:    General: Skin is warm and dry.  Neurological:     Mental Status: Kristine Montgomery is alert and oriented to person, place, and time.  Psychiatric:        Mood and Affect: Mood normal.    ED Results / Procedures / Treatments   Labs (all labs ordered are listed, but only abnormal results are displayed) Labs Reviewed - No data to display  EKG None  Radiology DG Ankle Complete Left  Result Date: 03/08/2021 CLINICAL DATA:  Swelling, pain EXAM: LEFT ANKLE COMPLETE - 3+ VIEW COMPARISON:  None. FINDINGS: There is no evidence of fracture, dislocation, or joint effusion.  Alignment is unremarkable. The ankle mortise is preserved. Bidirectional calcaneal enthesophytes. Mild degenerative changes in the midfoot. Moderate soft tissue swelling about the medial greater than lateral lower leg and ankle. IMPRESSION: No acute fracture or dislocation. Electronically Signed   By: 03/10/2021 M.D.   On: 03/08/2021 01:23    Procedures Procedures   Medications Ordered in ED Medications  furosemide (LASIX) tablet 20 mg (has no administration in time range)  ibuprofen (ADVIL) tablet 600 mg (600 mg Oral Given 03/08/21 0118)    ED Course  I have reviewed the triage vital signs and the nursing notes.  Pertinent labs & imaging results that were available during my care of the patient were reviewed by me and considered in my medical decision making (see chart for details).    MDM Rules/Calculators/A&P                           Patient presents with left ankle pain and swelling.  Overall nontoxic and vital signs are reassuring.  Ankle is notably asymmetrically swollen although it is pitting which would suggest soft tissue edema more than joint effusion.  Low suspicion for septic arthritis or infection.  Inflammatory arthritis such as gout is a potential.  As is DVT although I feel this is less likely.  Kristine Montgomery denies injury but will obtain an x-ray to rule out occult fracture.  X-rays reviewed and are negative.  Patient was given an additional dose of Lasix.  Recommend elevation.  We will have her return later today for ultrasound imaging to rule out DVT.  After history, exam, and medical workup I feel the patient has been appropriately medically screened and is safe for discharge home. Pertinent diagnoses were discussed with the patient. Patient was given return precautions.  Final Clinical Impression(s) / ED Diagnoses Final diagnoses:  Left ankle swelling    Rx / DC Orders ED Discharge Orders          Ordered    US Venous Img Lower Unilateral Left        03/08/21 0129              Shon Baton, MD 03/08/21 [phone]

## 2021-03-08 NOTE — ED Triage Notes (Signed)
Pt states that she has been having foot pain x3 days. No history of falls, no injury to foot. Swelling located on left ankle, states that swelling comes and goes but today pain was worse.

## 2021-03-12 ENCOUNTER — Other Ambulatory Visit: Payer: Self-pay | Admitting: Physician Assistant

## 2021-03-13 ENCOUNTER — Ambulatory Visit (HOSPITAL_COMMUNITY): Payer: 59

## 2021-03-16 ENCOUNTER — Ambulatory Visit (HOSPITAL_COMMUNITY): Payer: Medicaid Other

## 2021-03-23 ENCOUNTER — Emergency Department (HOSPITAL_COMMUNITY): Payer: 59

## 2021-03-23 ENCOUNTER — Observation Stay (HOSPITAL_COMMUNITY)
Admission: EM | Admit: 2021-03-23 | Discharge: 2021-03-28 | Disposition: A | Discharge status: Home Health | Payer: 59 | Attending: Family Medicine | Admitting: Family Medicine

## 2021-03-23 ENCOUNTER — Other Ambulatory Visit: Payer: Self-pay

## 2021-03-23 ENCOUNTER — Encounter (HOSPITAL_COMMUNITY): Payer: Self-pay

## 2021-03-23 DIAGNOSIS — E782 Mixed hyperlipidemia: Secondary | ICD-10-CM

## 2021-03-23 DIAGNOSIS — I2510 Atherosclerotic heart disease of native coronary artery without angina pectoris: Secondary | ICD-10-CM | POA: Insufficient documentation

## 2021-03-23 DIAGNOSIS — I480 Paroxysmal atrial fibrillation: Principal | ICD-10-CM | POA: Diagnosis present

## 2021-03-23 DIAGNOSIS — Z87891 Personal history of nicotine dependence: Secondary | ICD-10-CM | POA: Diagnosis not present

## 2021-03-23 DIAGNOSIS — Z79899 Other long term (current) drug therapy: Secondary | ICD-10-CM | POA: Insufficient documentation

## 2021-03-23 DIAGNOSIS — Z7982 Long term (current) use of aspirin: Secondary | ICD-10-CM | POA: Diagnosis not present

## 2021-03-23 DIAGNOSIS — R7989 Other specified abnormal findings of blood chemistry: Secondary | ICD-10-CM

## 2021-03-23 DIAGNOSIS — J449 Chronic obstructive pulmonary disease, unspecified: Secondary | ICD-10-CM | POA: Insufficient documentation

## 2021-03-23 DIAGNOSIS — R778 Other specified abnormalities of plasma proteins: Secondary | ICD-10-CM | POA: Diagnosis not present

## 2021-03-23 DIAGNOSIS — D72829 Elevated white blood cell count, unspecified: Secondary | ICD-10-CM

## 2021-03-23 DIAGNOSIS — I5041 Acute combined systolic (congestive) and diastolic (congestive) heart failure: Secondary | ICD-10-CM | POA: Diagnosis not present

## 2021-03-23 DIAGNOSIS — Z20822 Contact with and (suspected) exposure to covid-19: Secondary | ICD-10-CM | POA: Insufficient documentation

## 2021-03-23 DIAGNOSIS — I25110 Atherosclerotic heart disease of native coronary artery with unstable angina pectoris: Secondary | ICD-10-CM | POA: Diagnosis present

## 2021-03-23 DIAGNOSIS — I4891 Unspecified atrial fibrillation: Secondary | ICD-10-CM

## 2021-03-23 DIAGNOSIS — I255 Ischemic cardiomyopathy: Secondary | ICD-10-CM | POA: Diagnosis not present

## 2021-03-23 DIAGNOSIS — I10 Essential (primary) hypertension: Secondary | ICD-10-CM | POA: Diagnosis not present

## 2021-03-23 DIAGNOSIS — J45909 Unspecified asthma, uncomplicated: Secondary | ICD-10-CM | POA: Diagnosis not present

## 2021-03-23 DIAGNOSIS — R739 Hyperglycemia, unspecified: Secondary | ICD-10-CM

## 2021-03-23 DIAGNOSIS — Z720 Tobacco use: Secondary | ICD-10-CM | POA: Diagnosis present

## 2021-03-23 DIAGNOSIS — E876 Hypokalemia: Secondary | ICD-10-CM

## 2021-03-23 DIAGNOSIS — I214 Non-ST elevation (NSTEMI) myocardial infarction: Secondary | ICD-10-CM | POA: Diagnosis present

## 2021-03-23 DIAGNOSIS — D75839 Thrombocytosis, unspecified: Secondary | ICD-10-CM

## 2021-03-23 DIAGNOSIS — R002 Palpitations: Secondary | ICD-10-CM | POA: Diagnosis present

## 2021-03-23 LAB — MAGNESIUM: Magnesium: 2 mg/dL (ref 1.7–2.4)

## 2021-03-23 LAB — BRAIN NATRIURETIC PEPTIDE: B Natriuretic Peptide: 105 pg/mL — ABNORMAL HIGH (ref 0.0–100.0)

## 2021-03-23 LAB — TROPONIN I (HIGH SENSITIVITY)
Troponin I (High Sensitivity): 12 ng/L (ref <18)
Troponin I (High Sensitivity): 23 ng/L — ABNORMAL HIGH (ref <18)
Troponin I (High Sensitivity): 76 ng/L — ABNORMAL HIGH (ref <18)
Troponin I (High Sensitivity): 100 ng/L (ref <18)

## 2021-03-23 LAB — CBC WITH DIFFERENTIAL/PLATELET
Abs Immature Granulocytes: 0.03 10*3/uL (ref 0.00–0.07)
Basophils Absolute: 0.1 10*3/uL (ref 0.0–0.1)
Basophils Relative: 0 %
Eosinophils Absolute: 0.2 10*3/uL (ref 0.0–0.5)
Eosinophils Relative: 2 %
HCT: 33.5 % — ABNORMAL LOW (ref 36.0–46.0)
Hemoglobin: 10.8 g/dL — ABNORMAL LOW (ref 12.0–15.0)
Immature Granulocytes: 0 %
Lymphocytes Relative: 26 %
Lymphs Abs: 3.1 10*3/uL (ref 0.7–4.0)
MCH: 27.8 pg (ref 26.0–34.0)
MCHC: 32.2 g/dL (ref 30.0–36.0)
MCV: 86.1 fL (ref 80.0–100.0)
Monocytes Absolute: 0.9 10*3/uL (ref 0.1–1.0)
Monocytes Relative: 8 %
Neutro Abs: 7.9 10*3/uL — ABNORMAL HIGH (ref 1.7–7.7)
Neutrophils Relative %: 64 %
Platelets: 403 10*3/uL — ABNORMAL HIGH (ref 150–400)
RBC: 3.89 MIL/uL (ref 3.87–5.11)
RDW: 15.2 % (ref 11.5–15.5)
WBC: 12.2 10*3/uL — ABNORMAL HIGH (ref 4.0–10.5)
nRBC: 0 % (ref 0.0–0.2)

## 2021-03-23 LAB — RESP PANEL BY RT-PCR (FLU A&B, COVID) ARPGX2
Influenza A by PCR: NEGATIVE
Influenza B by PCR: NEGATIVE
SARS Coronavirus 2 by RT PCR: NEGATIVE

## 2021-03-23 LAB — BASIC METABOLIC PANEL WITH GFR
Anion gap: 13 (ref 5–15)
BUN: 15 mg/dL (ref 6–20)
CO2: 19 mmol/L — ABNORMAL LOW (ref 22–32)
Calcium: 8.6 mg/dL — ABNORMAL LOW (ref 8.9–10.3)
Chloride: 102 mmol/L (ref 98–111)
Creatinine, Ser: 0.83 mg/dL (ref 0.44–1.00)
GFR, Estimated: >60 mL/min
Glucose, Bld: 134 mg/dL — ABNORMAL HIGH (ref 70–99)
Potassium: 3.1 mmol/L — ABNORMAL LOW (ref 3.5–5.1)
Sodium: 134 mmol/L — ABNORMAL LOW (ref 135–145)

## 2021-03-23 LAB — PHOSPHORUS: Phosphorus: 3.1 mg/dL (ref 2.5–4.6)

## 2021-03-23 MED ORDER — TICAGRELOR 90 MG PO TABS
90.0000 mg | ORAL_TABLET | Freq: Two times a day (BID) | ORAL | Status: AC
  Administered 2021-03-23: 90 mg via ORAL
  Filled 2021-03-23: qty 1

## 2021-03-23 MED ORDER — ENOXAPARIN SODIUM 120 MG/0.8ML IJ SOSY
110.0000 mg | PREFILLED_SYRINGE | Freq: Two times a day (BID) | INTRAMUSCULAR | Status: DC
  Administered 2021-03-23: 110 mg via SUBCUTANEOUS
  Filled 2021-03-23: qty 0.8

## 2021-03-23 MED ORDER — DILTIAZEM HCL-DEXTROSE 125-5 MG/125ML-% IV SOLN (PREMIX)
5.0000 mg/h | INTRAVENOUS | Status: DC
  Administered 2021-03-23: 10 mg/h via INTRAVENOUS
  Filled 2021-03-23: qty 125

## 2021-03-23 MED ORDER — ATORVASTATIN CALCIUM 40 MG PO TABS
80.0000 mg | ORAL_TABLET | Freq: Every day | ORAL | Status: DC
  Administered 2021-03-23 – 2021-03-27 (×5): 80 mg via ORAL
  Filled 2021-03-23 (×6): qty 2

## 2021-03-23 MED ORDER — APIXABAN 5 MG PO TABS
5.0000 mg | ORAL_TABLET | Freq: Two times a day (BID) | ORAL | Status: DC
  Administered 2021-03-23 – 2021-03-28 (×11): 5 mg via ORAL
  Filled 2021-03-23 (×11): qty 1

## 2021-03-23 MED ORDER — METOPROLOL SUCCINATE ER 25 MG PO TB24
25.0000 mg | ORAL_TABLET | Freq: Every day | ORAL | Status: DC
  Administered 2021-03-23: 25 mg via ORAL
  Filled 2021-03-23 (×2): qty 1

## 2021-03-23 MED ORDER — DAPAGLIFLOZIN PROPANEDIOL 10 MG PO TABS
10.0000 mg | ORAL_TABLET | Freq: Every day | ORAL | Status: DC
  Administered 2021-03-23 – 2021-03-28 (×6): 10 mg via ORAL
  Filled 2021-03-23 (×9): qty 1

## 2021-03-23 MED ORDER — TICAGRELOR 90 MG PO TABS
90.0000 mg | ORAL_TABLET | Freq: Two times a day (BID) | ORAL | Status: DC
  Administered 2021-03-23: 90 mg via ORAL
  Filled 2021-03-23: qty 1

## 2021-03-23 MED ORDER — CLOPIDOGREL BISULFATE 75 MG PO TABS
75.0000 mg | ORAL_TABLET | Freq: Every day | ORAL | Status: DC
  Administered 2021-03-24 – 2021-03-28 (×5): 75 mg via ORAL
  Filled 2021-03-23 (×5): qty 1

## 2021-03-23 MED ORDER — AMIODARONE HCL 200 MG PO TABS
200.0000 mg | ORAL_TABLET | Freq: Every day | ORAL | Status: DC
Start: 2021-04-06 — End: 2021-03-27

## 2021-03-23 MED ORDER — DILTIAZEM LOAD VIA INFUSION
10.0000 mg | Freq: Once | INTRAVENOUS | Status: AC
  Administered 2021-03-23: 10 mg via INTRAVENOUS
  Filled 2021-03-23: qty 10

## 2021-03-23 MED ORDER — ACETAMINOPHEN 325 MG PO TABS
650.0000 mg | ORAL_TABLET | Freq: Four times a day (QID) | ORAL | Status: DC | PRN
  Administered 2021-03-23 – 2021-03-24 (×2): 650 mg via ORAL
  Filled 2021-03-23 (×2): qty 2

## 2021-03-23 MED ORDER — ASPIRIN EC 81 MG PO TBEC
81.0000 mg | DELAYED_RELEASE_TABLET | Freq: Every day | ORAL | Status: DC
  Administered 2021-03-23: 81 mg via ORAL
  Filled 2021-03-23: qty 1

## 2021-03-23 MED ORDER — POTASSIUM CHLORIDE CRYS ER 20 MEQ PO TBCR
40.0000 meq | EXTENDED_RELEASE_TABLET | Freq: Once | ORAL | Status: AC
  Administered 2021-03-23: 40 meq via ORAL
  Filled 2021-03-23: qty 2

## 2021-03-23 MED ORDER — AMIODARONE HCL 200 MG PO TABS
400.0000 mg | ORAL_TABLET | Freq: Two times a day (BID) | ORAL | Status: DC
  Administered 2021-03-23 – 2021-03-25 (×6): 400 mg via ORAL
  Filled 2021-03-23 (×7): qty 2

## 2021-03-23 MED ORDER — SACUBITRIL-VALSARTAN 24-26 MG PO TABS
1.0000 | ORAL_TABLET | Freq: Two times a day (BID) | ORAL | Status: DC
  Administered 2021-03-23 – 2021-03-28 (×11): 1 via ORAL
  Filled 2021-03-23 (×11): qty 1

## 2021-03-23 MED ORDER — AMIODARONE IV BOLUS ONLY 150 MG/100ML
150.0000 mg | Freq: Once | INTRAVENOUS | Status: AC
  Administered 2021-03-23: 150 mg via INTRAVENOUS
  Filled 2021-03-23: qty 100

## 2021-03-23 NOTE — ED Triage Notes (Signed)
Pt arrived via [facility] c/o chest pain. Pt reports elevated HR (SVT) for roughly 45 mins. Pt A+O X 4. Pts HR in Triage 174-194. Pt now describing pain as fluttering. Pt reports having a bad day and crying all day.

## 2021-03-23 NOTE — Evaluation (Signed)
Physical Therapy Evaluation Patient Details Name: Kristine Montgomery MRN: [id number] DOB: 05-Sep-1965 Today's Date: 03/23/2021  History of Present Illness  HPI: Kristine Montgomery is a 55 y.o. female with medical history significant for NSTEMI, CAD status post stent placement, ischemic cardiomyopathy, combined systolic and diastolic CHF who presents to the emergency department due to palpitations which started few hours prior to arrival to the ED.  Patient states that she had a lot of stress due to yesterday being the fourth year that her husband passed, she states that she was laying in bed around 11:30 PM when she noted palpitations with increased heart rate which was associated with some shortness of breath.  She denies chest pain, fever, chills, nausea, vomiting or abdominal pain.  Patient ambulates with a cane at baseline.  Clinical Impression  Pt demonstrates some inconsistencies with voluntary motion.  Appears fearful of increasing her pain with activity.  Pt will benefit from skilled PT to improve confidence and knowledge of exercises to assist pt in improving her mobility        Recommendations for follow up therapy are one component of a multi-disciplinary discharge planning process, led by the attending physician.  Recommendations may be updated based on patient status, additional functional criteria and insurance authorization.  Follow Up Recommendations Skilled nursing-short term rehab (<3 hours/day)    Assistance Recommended at Discharge Frequent or constant Supervision/Assistance  Functional Status Assessment Patient has had a recent decline in their functional status and demonstrates the ability to make significant improvements in function in a reasonable and predictable amount of time.  Equipment Recommendations  None recommended by PT    Recommendations for Other Services       Precautions / Restrictions Precautions Precautions: Fall Restrictions Weight Bearing Restrictions: No       Mobility  Bed Mobility Overal bed mobility: Needs Assistance Bed Mobility: Supine to Sit;Sit to Supine     Supine to sit: Min assist Sit to supine: Mod assist   General bed mobility comments: assist to place legs back into bed    Transfers Overall transfer level: Needs assistance Equipment used: Rolling walker (2 wheels) Transfers: Sit to/from Stand;Bed to chair/wheelchair/BSC Sit to Stand: Min guard           General transfer comment: pt refused transfer at this time.  Completed 3 sit to stand and side step to the head of the bead with min guard assist           Pertinent Vitals/Pain Pain Assessment: 0-10 Pain Score: 9  Pain Location: knees upon standing, pt immediately sits down stating it is to painful.  Therapist explained that yes, due to OA standing is painful but it will not harm her. Pain Descriptors / Indicators: Aching;Throbbing Pain Intervention(s): Limited activity within patient's tolerance    Home Living Family/patient expects to be discharged to:: Private residence Living Arrangements: Alone Available Help at Discharge: Family;Available PRN/intermittently Type of Home: Apartment         Home Layout: One level Home Equipment: Agricultural consultant (2 wheels);Cane - single point;Wheelchair - manual Additional Comments: Pt states she only ambulates to the restroom and back    Prior Function Prior Level of Function : Needs assist             Mobility Comments: limited to houshold ADLs Comments: daughter assists in housekeeping and shopping     Hand Dominance        Extremity/Trunk Assessment   Upper Extremity Assessment Upper Extremity Assessment: Generalized  weakness    Lower Extremity Assessment Lower Extremity Assessment: Generalized weakness (pt states she can not move legs without assist but then lifts both legs off the bed to allow therapist to place a pillow under them.  States she is unable to make a fist motion but then is  able to place hands around walker.)       Communication      Cognition Arousal/Alertness: Awake/alert Behavior During Therapy: Anxious Overall Cognitive Status: Within Functional Limits for tasks assessed                                          General Comments      Exercises General Exercises - Lower Extremity Ankle Circles/Pumps: Both;5 reps Quad Sets: Both;5 reps Long Arc Quad: Both;5 reps Heel Slides: Both;5 reps Hip ABduction/ADduction: Both;5 reps   Assessment/Plan    PT Assessment Patient needs continued PT services  PT Problem List Decreased strength;Decreased mobility;Decreased activity tolerance;Decreased balance;Pain       PT Treatment Interventions Therapeutic activities;Therapeutic exercise;Gait training    PT Goals (Current goals can be found in the Care Plan section)  Acute Rehab PT Goals Patient Stated Goal: to get bacl to walking PT Goal Formulation: With patient Time For Goal Achievement: 03/26/21 Potential to Achieve Goals: Good    Frequency Min 3X/week           AM-PAC PT "6 Clicks" Mobility  Outcome Measure Help needed turning from your back to your side while in a flat bed without using bedrails?: None Help needed moving from lying on your back to sitting on the side of a flat bed without using bedrails?: A Little Help needed moving to and from a bed to a chair (including a wheelchair)?: A Little Help needed standing up from a chair using your arms (e.g., wheelchair or bedside chair)?: A Little Help needed to walk in hospital room?: A Lot Help needed climbing 3-5 steps with a railing? : A Lot 6 Click Score: 17    End of Session Equipment Utilized During Treatment: Gait belt Activity Tolerance: Patient limited by pain Patient left: in bed;with call bell/phone within reach;with family/visitor present Nurse Communication: Mobility status PT Visit Diagnosis: Unsteadiness on feet (R26.81);Muscle weakness (generalized)  (M62.81)    Time: 9892-1194 PT Time Calculation (min) (ACUTE ONLY): 48 min   Charges:   PT Evaluation $PT Eval Low Complexity: 1 Low PT Treatments $Therapeutic Exercise: 8-22 mins       03/23/2021, 2:25 PM

## 2021-03-23 NOTE — TOC Initial Note (Signed)
Transition of Care [facility]) - Initial/Assessment Note    Patient Details  Name: Kristine Montgomery MRN: [id number] Date of Birth: 1965/07/15  [facility] CM/SW Contact:    Elliot Gault, [name] Phone Number: 03/23/2021, 2:44 PM  Clinical Narrative:                  Pt admitted from home. PT recommending SNF rehab. Spoke with pt's daughter to assess and inform that if pt desires SNF rehab, she will have to sign her check to the SNF and stay for at least 30 days. Pt's daughter states that pt cannot walk and needs to go to SNF so they are agreeable to the terms. Discussed CMS provider options and will refer as requested.  TOC will follow.  Expected Discharge Plan: Skilled Nursing Facility Barriers to Discharge: Continued Medical Work up   Patient Goals and CMS Choice Patient states their goals for this hospitalization and ongoing recovery are:: rehab CMS Medicare.gov Compare Post Acute Care list provided to:: Patient Represenative (must comment) Choice offered to / list presented to : Adult Children  Expected Discharge Plan and Services Expected Discharge Plan: Skilled Nursing Facility In-house Referral: Clinical Social Work   Post Acute Care Choice: Skilled Nursing Facility Living arrangements for the past 2 months: Apartment                                      Prior Living Arrangements/Services Living arrangements for the past 2 months: Apartment Lives with:: Self Patient language and need for interpreter reviewed:: Yes Do you feel safe going back to the place where you live?: Yes      Need for Family Participation in Patient Care: Yes (Comment) Care giver support system in place?: Yes (comment)   Criminal Activity/Legal Involvement Pertinent to Current Situation/Hospitalization: No - Comment as needed  Activities of Daily Living Home Assistive Devices/Equipment: Wheelchair, Medical laboratory scientific officer (specify quad or straight) ADL Screening (condition at time of  admission) Patient's cognitive ability adequate to safely complete daily activities?: Yes Is the patient deaf or have difficulty hearing?: No Does the patient have difficulty seeing, even when wearing glasses/contacts?: No Does the patient have difficulty concentrating, remembering, or making decisions?: Yes Patient able to express need for assistance with ADLs?: No Does the patient have difficulty dressing or bathing?: Yes Independently performs ADLs?: No Communication: Independent Dressing (OT): Needs assistance Is this a change from baseline?: Pre-admission baseline Grooming: Needs assistance Is this a change from baseline?: Pre-admission baseline Feeding: Independent Bathing: Needs assistance Is this a change from baseline?: Pre-admission baseline Toileting: Needs assistance Is this a change from baseline?: Pre-admission baseline In/Out Bed: Needs assistance Is this a change from baseline?: Pre-admission baseline Walks in Home: Needs assistance Is this a change from baseline?: Pre-admission baseline Does the patient have difficulty walking or climbing stairs?: Yes Weakness of Legs: Both Weakness of Arms/Hands: None  Permission Sought/Granted Permission sought to share information with : Oceanographer granted to share information with : Yes, Verbal Permission Granted     Permission granted to share info w AGENCY: [facility]        Emotional Assessment   Attitude/Demeanor/Rapport: Engaged Affect (typically observed): Pleasant Orientation: : Oriented to Self, Oriented to Place, Oriented to  Time, Oriented to Situation Alcohol / Substance Use: Not Applicable Psych Involvement: No (comment)  Admission diagnosis:  Paroxysmal atrial fibrillation (HCC) [I48.0] Atrial fibrillation with RVR (  [place]) [I48.91] Patient Active Problem List   Diagnosis Date Noted   Paroxysmal atrial fibrillation ([place]) 03/23/2021   Hypokalemia 03/23/2021   Leukocytosis  03/23/2021   Thrombocytosis 03/23/2021   Elevated troponin 03/23/2021   Hyperglycemia 03/23/2021   Ischemic cardiomyopathy 07/31/2020   Coronary artery disease involving native coronary artery of native heart with unstable angina pectoris ([place]) 07/31/2020   Nocturnal hypoxemia due to obesity 07/31/2020   Morbid obesity ([place]) 07/31/2020   Tobacco abuse 07/31/2020   Acute combined systolic and diastolic CHF, NYHA class 4 ([place]) 07/29/2020   Mixed hyperlipidemia 07/29/2020   NSTEMI (non-ST elevated myocardial infarction) ([place]) 07/29/2020   Unstable angina (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 [address] Phone: [phone] Fax: [place], [place] [address] [place] [address] Phone: [phone] Fax: [phone]     Social Determinants of Health (SDOH) Interventions    Readmission Risk Interventions No flowsheet data found.

## 2021-03-23 NOTE — Plan of Care (Signed)
  Problem: Acute Rehab PT Goals(only PT should resolve) Goal: Pt Will Go Supine/Side To Sit Flowsheets (Taken 03/23/2021 1424) Pt will go Supine/Side to Sit: with modified independence Goal: Pt Will Go Sit To Supine/Side Flowsheets (Taken 03/23/2021 1424) Pt will go Sit to Supine/Side: with modified independence Goal: Pt Will Ambulate Flowsheets (Taken 03/23/2021 1424) Pt will Ambulate:  50 feet  with modified independence  with rolling walker

## 2021-03-23 NOTE — H&P (Signed)
History and Physical  Kristine Montgomery GYJ:[id number] DOB: [birth year] DOA: 03/23/2021  Referring physician: Pollyann Savoy, MD PCP: Elfredia Nevins, MD Patient coming from: Home  Chief Complaint: Palpitations  HPI: Kristine Montgomery is a 55 y.o. female with medical history significant for NSTEMI, CAD status post stent placement, ischemic cardiomyopathy, combined systolic and diastolic CHF who presents to the emergency department due to palpitations which started few hours prior to arrival to the ED.  Patient states that she had a lot of stress due to yesterday being the fourth year that her husband passed, she states that she was laying in bed around 11:30 PM when she noted palpitations with increased heart rate which was associated with some shortness of breath.  She denies chest pain, fever, chills, nausea, vomiting or abdominal pain.  Patient ambulates with a cane at baseline.  ED Course:  In the emergency department, he was tachycardic, and BP was soft at 81/61, but other vital signs were within normal range.  Work-up in the ED showed leukocytosis, thrombocytosis, mild hyponatremia, hypokalemia and hyperglycemia.  BNP 105, troponin x2 - 12 > 23.  Influenza A, B, SARS coronavirus 2 was negative. Chest x-ray showed no evidence of acute chest disease.  Mild cardiomegaly Patient was started on IV Cardizem drip due to A. fib with RVR, however, patient's BP dropped to hypotensive range and this was stopped.  Cardiologist was consulted and recommended amiodarone drip per ED physician.  Hospitalist was asked to admit patient for further evaluation and management.  Review of Systems: Constitutional: Negative for chills and fever.  HENT: Negative for ear pain and sore throat.   Eyes: Negative for pain and visual disturbance.  Respiratory: Negative for cough, chest tightness and shortness of breath.   Cardiovascular: Negative for chest pain and palpitations.  Gastrointestinal: Negative for abdominal  pain and vomiting.  Endocrine: Negative for polyphagia and polyuria.  Genitourinary: Negative for decreased urine volume, dysuria, enuresis Musculoskeletal: Negative for arthralgias and back pain.  Skin: Negative for color change and rash.  Allergic/Immunologic: Negative for immunocompromised state.  Neurological: Negative for tremors, syncope, speech difficulty, weakness, light-headedness and headaches.  Hematological: Does not bruise/bleed easily.  All other systems reviewed and are negative   Past Medical History:  Diagnosis Date   Acute combined systolic and diastolic CHF, NYHA class 4 (HCC) 07/29/2020   a. EF 30% by echo in 07/2020 b. EF 55-60% by echo in 11/2020   Arthritis    Asthma    CAD (coronary artery disease)    a. 07/2020: s/p NSTEMI with DES to proxLAD and DES to proxLCx with medical management recommended of the occluded RCA.   Chronic bronchitis (HCC)    COPD (chronic obstructive pulmonary disease) (HCC)    Edema extremities    History of hiatal hernia    Hyperlipidemia LDL goal <70 07/29/2020   Ovarian cyst    Pneumonia    Past Surgical History:  Procedure Laterality Date   CESAREAN SECTION     CORONARY STENT INTERVENTION N/A 07/29/2020   Procedure: CORONARY STENT INTERVENTION;  Surgeon: Lennette Bihari, MD;  Location: [facility];  Service: Cardiovascular;  Laterality: N/A;   KNEE ARTHROSCOPY WITH MEDIAL MENISECTOMY Right 05/23/2020   Procedure: KNEE ARTHROSCOPY WITH MEDIAL AND LATERAL MENISECTOMY; 3 COMPARTMENT SYNOVECTOMY;  Surgeon: Vickki Hearing, MD;  Location: [facility];  Service: Orthopedics;  Laterality: Right;   RIGHT/LEFT HEART CATH AND CORONARY ANGIOGRAPHY N/A 07/29/2020   Procedure: RIGHT/LEFT HEART CATH AND CORONARY  ANGIOGRAPHY;  Surgeon: Lennette Bihari, MD;  Location: [facility] INVASIVE CV LAB;  Service: Cardiovascular;  Laterality: N/A;   TONSILLECTOMY     TUBAL LIGATION      Social History:  reports that she has quit smoking. Her smoking use  included cigarettes. She has a 15.00 pack-year smoking history. She has never used smokeless tobacco. She reports that she does not currently use alcohol. She reports that she does not use drugs.   No Known Allergies  Family History  Problem Relation Age of Onset   Cancer Paternal Grandfather        colon   Cancer Maternal Grandmother        lung   COPD Father    Non-Hodgkin's lymphoma Mother    Diabetes Mother    Cancer Mother        cervical   Congestive Heart Failure Mother    Hypertension Sister    Other Son        fluid around heart     Prior to Admission medications   Medication Sig Start Date End Date Taking? Authorizing Provider  aspirin EC 81 MG tablet Take 1 tablet (81 mg total) by mouth daily. Swallow whole. 07/31/20   Bhagat, Sharrell Ku, PA  atorvastatin (LIPITOR) 80 MG tablet TAKE (1) TABLET BY MOUTH ONCE DAILY. 01/23/21   Bhagat, Bhavinkumar, PA  FARXIGA 10 MG TABS tablet TAKE 1 TABLET BY MOUTH ONCE A DAY. 01/23/21   Bhagat, Bhavinkumar, PA  furosemide (LASIX) 20 MG tablet TAKE (1) TABLET BY MOUTH ONCE DAILY. TAKE AN ADDITIONAL 1 TABLET FOR 3 POUND WEIGHT GAIN OVERNIGHT OR 5 POUNDS IN A WEEK. 12/24/20   Strader, [place] M, PA-C  metoprolol succinate (TOPROL-XL) 25 MG 24 hr tablet TAKE 1 TABLET BY MOUTH ONCE DAILY 01/23/21   Bhagat, Bhavinkumar, PA  nitroGLYCERIN (NITROSTAT) 0.4 MG SL tablet Place 1 tablet (0.4 mg total) under the tongue every 5 (five) minutes x 3 doses as needed for chest pain. Patient not taking: Reported on 11/21/2020 07/31/20   Manson Passey, PA  nitroGLYCERIN (NITROSTAT) 0.4 MG SL tablet PLACE 1 TABLET (0.4 MG TOTAL) UNDER THE TONGUE EVERY FIVE MINUTES X 3 DOSES AS NEEDED FOR CHEST PAIN. 07/31/20 07/31/21  Bhagat, Sharrell Ku, PA  sacubitril-valsartan (ENTRESTO) 24-26 MG TAKE 1 TABLET BY MOUTH TWO TIMES DAILY. 07/31/20 07/31/21  Manson Passey, PA  spironolactone (ALDACTONE) 25 MG tablet TAKE 1/2 TABLET BY MOUTH DAILY 03/13/21   Pricilla Riffle, MD   ticagrelor (BRILINTA) 90 MG TABS tablet TAKE 1 TABLET (90 MG TOTAL) BY MOUTH TWO TIMES DAILY. 07/31/20 07/31/21  Manson Passey, PA    Physical Exam: BP 96/73   Pulse 65   Temp 98 F (36.7 C) (Oral)   Resp 18   Ht 5\' 1"  (1.549 m)   Wt 108 kg   LMP 06/27/2017 (Within Weeks)   SpO2 99%   BMI 44.99 kg/m   General: 55 y.o. year-old female well developed well nourished in no acute distress.  Alert and oriented x3. HEENT: NCAT, EOMI Neck: Supple, trachea medial Cardiovascular: Tachycardia.  Irregular rate and rhythm with no rubs or gallops.  No thyromegaly or JVD noted.  No lower extremity edema. 2/4 pulses in all 4 extremities. Respiratory: Clear to auscultation with no wheezes or rales. Good inspiratory effort. Abdomen: Soft, nontender nondistended with normal bowel sounds x4 quadrants. Muskuloskeletal: No cyanosis, clubbing or edema noted bilaterally Neuro: CN II-XII intact, strength 5/5 x 4, sensation, reflexes intact Skin: No ulcerative lesions noted  or rashes Psychiatry: Judgement and insight appear normal. Mood is appropriate for condition and setting          Labs on Admission:  Basic Metabolic Panel: Recent Labs  Lab 03/23/21 0049  NA 134*  K 3.1*  CL 102  CO2 19*  GLUCOSE 134*  BUN 15  CREATININE 0.83  CALCIUM 8.6*   Liver Function Tests: No results for input(s): AST, ALT, ALKPHOS, BILITOT, PROT, ALBUMIN in the last 168 hours. No results for input(s): LIPASE, AMYLASE in the last 168 hours. No results for input(s): AMMONIA in the last 168 hours. CBC: Recent Labs  Lab 03/23/21 0049  WBC 12.2*  NEUTROABS 7.9*  HGB 10.8*  HCT 33.5*  MCV 86.1  PLT 403*   Cardiac Enzymes: No results for input(s): CKTOTAL, CKMB, CKMBINDEX, TROPONINI in the last 168 hours.  BNP (last 3 results) Recent Labs    07/28/20 1211 03/23/21 0049  BNP 200.0* 105.0*    ProBNP (last 3 results) No results for input(s): PROBNP in the last 8760 hours.  CBG: No results for  input(s): GLUCAP in the last 168 hours.  Radiological Exams on Admission: DG Chest Port 1 View  Result Date: 03/23/2021 CLINICAL DATA:  Chest pain. EXAM: PORTABLE CHEST 1 VIEW COMPARISON:  Portable chest 07/28/2020 FINDINGS: There is mild cardiomegaly with coronary artery stents on the left and no vascular congestion findings, with interval resolution of prior interstitial edema and vascular distension. The lungs are clear. No pleural effusion is seen. Thoracic spondylosis. Mild osteopenia. IMPRESSION: No evidence of acute chest disease.  Mild cardiomegaly. Electronically Signed   By: Almira Bar M.D.   On: 03/23/2021 01:20    EKG: I independently viewed the EKG done and my findings are as followed: Initial EKG showed A. fib with RVR.  Subsequent EKG showed normal sinus rhythm at a rate of 81 bpm  Assessment/Plan Present on Admission:  Paroxysmal atrial fibrillation (HCC)  Morbid obesity (HCC)  Tobacco abuse  Acute combined systolic and diastolic CHF, NYHA class 4 (HCC)  Ischemic cardiomyopathy  NSTEMI (non-ST elevated myocardial infarction) (HCC)  Coronary artery disease involving native coronary artery of native heart with unstable angina pectoris (HCC)  Principal Problem:   Paroxysmal atrial fibrillation (HCC) Active Problems:   Acute combined systolic and diastolic CHF, NYHA class 4 (HCC)   Mixed hyperlipidemia   NSTEMI (non-ST elevated myocardial infarction) (HCC)   Ischemic cardiomyopathy   Coronary artery disease involving native coronary artery of native heart with unstable angina pectoris (HCC)   Morbid obesity (HCC)   Tobacco abuse   Hypokalemia   Leukocytosis   Thrombocytosis   Elevated troponin   Hyperglycemia  Paroxysmal atrial fibrillation Patient was started on IV Cardizem drip, she became hypotensive and this was changed to IV amiodarone after ED consult to cardiologist.  This was stopped due to converting to normal sinus rhythm. Of note, patient has no prior  history of atrial fibrillation Continue telemetry Echocardiogram done on 11/14/2020 showed LVEF of 55-60%.  LV demonstrates RWMA. Continue home Toprol-XL  Hypokalemia K+ is 3.1 K+ will be replenished Please monitor for AM K+ for further replenishmemnt  Leukocytosis/thrombocytosis/hyperglycemia possibly due to reactive process WBC 12.2, platelets 403, CBG 134 Continue to monitor CBC and BMP with morning labs  Elevated troponin possibly secondary to type II demand ischemia Troponin x2- 12 > 23, continue to trend troponin Patient denies any chest pain  Mixed hyperlipidemia Continue Lipitor  NSTEMI/multivessel CAD with Unstable Angina/CAD status post PCI Continue aspirin,  Brilinta, Lipitor  Ischemic cardiomyopathy with acute combined systolic and diastolic heart failure Echocardiogram done on 11/14/2020 showed LVEF of 55-60% (improved from echo done on 07/28/2020 which showed LVEF of 30% with diffuse hypokinesis). Continue aspirin, Lipitor Toprol-XL, Entresto and Aldactone will be held at this time due to soft BP Lasix will be temporarily held due to hypokalemia  Hyperlipidemia Continue Lipitor  Tobacco abuse Patient was counseled on tobacco abuse cessation  Morbid obesity (BMI 44.99 kg/m) Patient was counseled about the cardiovascular and metabolic risk of morbid obesity. Patient was counseled for diet control, exercise regimen and weight loss.    Coronary stent intervention on 07/29/2020  Conclusion    Prox LAD lesion is 95% stenosed. Ramus lesion is 40% stenosed. Ost LAD lesion is 20% stenosed. Ost Cx lesion is 30% stenosed. Prox Cx lesion is 95% stenosed. Prox RCA to Mid RCA lesion is 100% stenosed. Post intervention, there is a 0% residual stenosis. Post intervention, there is a 0% residual stenosis. A stent was successfully placed. A stent was successfully placed.    DVT prophylaxis: Lovenox  Code Status: Full code  Family Communication: Daughter at bedside  (all questions answered to satisfaction)  Disposition Plan:  Patient is from:                        home Anticipated DC to:                   SNF or family members home Anticipated DC date:               2-3 days Anticipated DC barriers:          Patient requires inpatient management due to paroxysmal A. fib with RVR  Consults called: None  Admission status: Observation    Frankey Shown MD [facility]  03/23/2021, 4:40 AM

## 2021-03-23 NOTE — Progress Notes (Addendum)
Kristine Montgomery is a 55 y.o. female with medical history significant for NSTEMI, CAD status post stent placement, ischemic cardiomyopathy, combined systolic and diastolic CHF who presents to the emergency department due to palpitations which started few hours prior to arrival to the ED.  Patient states that she had a lot of stress due to yesterday being the fourth year that her husband passed, she states that she was laying in bed around 11:30 PM when she noted palpitations with increased heart rate which was associated with some shortness of breath.  She denies chest pain, fever, chills, nausea, vomiting or abdominal pain.  Patient ambulates with a cane at baseline.  03/23/2021: Patient was seen and examined at her bedside.  Her daughters were present in the room.  She denies any palpitations at the time of this exam.  She denies any chest pain or shortness of breath.  She uses a cane and a wheelchair at baseline, she lives alone.  Seen by cardiology, planning on adding DOAC in the setting of new onset paroxysmal A. fib for CVA prevention.  PT OT will assess for DC planning.  Please refer to H&P dictated by my partner Dr. Thomes Dinning on 03/23/2021 for further details of the assessment and plan.

## 2021-03-23 NOTE — ED Notes (Signed)
ED Provider at bedside. 

## 2021-03-23 NOTE — ED Provider Notes (Signed)
[facility] EMERGENCY DEPARTMENT Provider Note  CSN: [id number] Arrival date & time: 03/23/21 4098    History Chief Complaint  Patient presents with   Palpitations    ARMENTA ERSKIN is a 55 y.o. female with history of CAD s/p DES in Mar 2022 (on ASA and Brilinta) as well as CHF and DM reports she was in her usual state of health until about PTA when she noticed her heart was racing. It caused her to feel some SOB and nervousness but no chest pains. No prior history of same. She no longer smokes. Has not missed any doses of her meds. In rapid rate with EMS.    Past Medical History:  Diagnosis Date   Acute combined systolic and diastolic CHF, NYHA class 4 (HCC) 07/29/2020   a. EF 30% by echo in 07/2020 b. EF 55-60% by echo in 11/2020   Arthritis    Asthma    CAD (coronary artery disease)    a. 07/2020: s/p NSTEMI with DES to proxLAD and DES to proxLCx with medical management recommended of the occluded RCA.   Chronic bronchitis (HCC)    COPD (chronic obstructive pulmonary disease) (HCC)    Edema extremities    History of hiatal hernia    Hyperlipidemia LDL goal <70 07/29/2020   Ovarian cyst    Pneumonia     Past Surgical History:  Procedure Laterality Date   CESAREAN SECTION     CORONARY STENT INTERVENTION N/A 07/29/2020   Procedure: CORONARY STENT INTERVENTION;  Surgeon: Lennette Bihari, MD;  Location: [facility] INVAS[facility] CV LAB;  Service: Cardiovascular;  Laterality: N/A;   KNEE ARTHROSCOPY WITH MEDIAL MENISECTOMY Right 05/23/2020   Procedure: KNEE ARTHROSCOPY WITH MEDIAL AND LATERAL MENISECTOMY; 3 COMPARTMENT SYNOVECTOMY;  Surgeon: Vickki Hearing, MD;  Location: [facility];  Service: Orthopedics;  Laterality: Right;   RIGHT/LEFT HEART CATH AND CORONARY ANGIOGRAPHY N/A 07/29/2020   Procedure: RIGHT/LEFT HEART CATH AND CORONARY ANGIOGRAPHY;  Surgeon: Lennette Bihari, MD;  Location: [facility] INVASIVE CV [facility];  Service: Cardiovascular;  Laterality: N/A;   TONSILLECTOMY     TUBAL  LIGATION      Family History  Problem Relation Age of Onset   Cancer Paternal Grandfather        colon   Cancer Maternal Grandmother        lung   COPD Father    Non-Hodgkin's lymphoma Mother    Diabetes Mother    Cancer Mother        cervical   Congestive Heart Failure Mother    Hypertension Sister    Other Son        fluid around heart    Social History   Tobacco Use   Smoking status: Former    Packs/day: 0.50    Years: 30.00    Pack years: 15.00    Types: Cigarettes   Smokeless tobacco: Never   Tobacco comments:    pt states last cigarette was 3/13, prior to coming into hospital. Pt plans to continue cessation.  Vaping Use   Vaping Use: Never used  Substance Use Topics   Alcohol use: Not Currently    Comment: occasional   Drug use: No     Home Medications Prior to Admission medications   Medication Sig Start Date End Date Taking? Authorizing Provider  aspirin EC 81 MG tablet Take 1 tablet (81 mg total) by mouth daily. Swallow whole. 07/31/20   Bhagat, Sharrell Ku, PA  atorvastatin (LIPITOR) 80 MG tablet TAKE (  1) TABLET BY MOUTH ONCE DAILY. 01/23/21   Bhagat, Bhavinkumar, PA  FARXIGA 10 MG TABS tablet TAKE 1 TABLET BY MOUTH ONCE A DAY. 01/23/21   Bhagat, Bhavinkumar, PA  furosemide (LASIX) 20 MG tablet TAKE (1) TABLET BY MOUTH ONCE DAILY. TAKE AN ADDITIONAL 1 TABLET FOR 3 POUND WEIGHT GAIN OVERNIGHT OR 5 POUNDS IN A WEEK. 12/24/20   Strader, [place] M, PA-C  metoprolol succinate (TOPROL-XL) 25 MG 24 hr tablet TAKE 1 TABLET BY MOUTH ONCE DAILY 01/23/21   Bhagat, Bhavinkumar, PA  nitroGLYCERIN (NITROSTAT) 0.4 MG SL tablet Place 1 tablet (0.4 mg total) under the tongue every 5 (five) minutes x 3 doses as needed for chest pain. Patient not taking: Reported on 11/21/2020 07/31/20   Manson Passey, PA  nitroGLYCERIN (NITROSTAT) 0.4 MG SL tablet PLACE 1 TABLET (0.4 MG TOTAL) UNDER THE TONGUE EVERY FIVE MINUTES X 3 DOSES AS NEEDED FOR CHEST PAIN. 07/31/20 07/31/21  Bhagat,  Sharrell Ku, PA  sacubitril-valsartan (ENTRESTO) 24-26 MG TAKE 1 TABLET BY MOUTH TWO TIMES DAILY. 07/31/20 07/31/21  Manson Passey, PA  spironolactone (ALDACTONE) 25 MG tablet TAKE 1/2 TABLET BY MOUTH DAILY 03/13/21   Pricilla Riffle, MD  ticagrelor (BRILINTA) 90 MG TABS tablet TAKE 1 TABLET (90 MG TOTAL) BY MOUTH TWO TIMES DAILY. 07/31/20 07/31/21  Manson Passey, PA     Allergies    Patient has no known allergies.   Review of Systems   Review of Systems A comprehensive review of systems was completed and negative except as noted in HPI.    Physical Exam BP 96/73   Pulse 65   Temp 98 F (36.7 C) (Oral)   Resp 18   Ht 5\' 1"  (1.549 m)   Wt 108 kg   LMP 06/27/2017 (Within Weeks)   SpO2 99%   BMI 44.99 kg/m   Physical Exam Vitals and nursing note reviewed.  Constitutional:      Appearance: Normal appearance.  HENT:     Head: Normocephalic and atraumatic.     Nose: Nose normal.     Mouth/Throat:     Mouth: Mucous membranes are moist.  Eyes:     Extraocular Movements: Extraocular movements intact.     Conjunctiva/sclera: Conjunctivae normal.  Cardiovascular:     Rate and Rhythm: Tachycardia present. Rhythm irregular.  Pulmonary:     Effort: Pulmonary effort is normal.     Breath sounds: Normal breath sounds.  Abdominal:     General: Abdomen is flat.     Palpations: Abdomen is soft.     Tenderness: There is no abdominal tenderness.  Musculoskeletal:        General: No swelling. Normal range of motion.     Cervical back: Neck supple.     Right lower leg: No edema.     Left lower leg: No edema.  Skin:    General: Skin is warm and dry.  Neurological:     General: No focal deficit present.     Mental Status: She is alert.  Psychiatric:        Mood and Affect: Mood normal.     ED Results / Procedures / Treatments   Labs (all labs ordered are listed, but only abnormal results are displayed) Labs Reviewed  BASIC METABOLIC PANEL - Abnormal; Notable for the  following components:      Result Value   Sodium 134 (*)    Potassium 3.1 (*)    CO2 19 (*)    Glucose, Bld 134 (*)  Calcium 8.6 (*)    All other components within normal limits  BRAIN NATRIURETIC PEPTIDE - Abnormal; Notable for the following components:   B Natriuretic Peptide 105.0 (*)    All other components within normal limits  CBC WITH DIFFERENTIAL/PLATELET - Abnormal; Notable for the following components:   WBC 12.2 (*)    Hemoglobin 10.8 (*)    HCT 33.5 (*)    Platelets 403 (*)    Neutro Abs 7.9 (*)    All other components within normal limits  TROPONIN I (HIGH SENSITIVITY) - Abnormal; Notable for the following components:   Troponin I (High Sensitivity) 23 (*)    All other components within normal limits  RESP PANEL BY RT-PCR (FLU A&B, COVID) ARPGX2  TROPONIN I (HIGH SENSITIVITY)    EKG EKG Interpretation  Date/Time:  Monday March 23 2021 03:04:40 EST Ventricular Rate:  81 PR Interval:  152 QRS Duration: 93 QT Interval:  387 QTC Calculation: 450 R Axis:   54 Text Interpretation: Sinus rhythm Low voltage, precordial leads Since last tracing Sinus rhythm has replaced Atrial fibrillation Confirmed by Susy Frizzle [phone]) on 03/23/2021 3:11:40 AM  Radiology DG Chest Port 1 View  Result Date: 03/23/2021 CLINICAL DATA:  Chest pain. EXAM: PORTABLE CHEST 1 VIEW COMPARISON:  Portable chest 07/28/2020 FINDINGS: There is mild cardiomegaly with coronary artery stents on the left and no vascular congestion findings, with interval resolution of prior interstitial edema and vascular distension. The lungs are clear. No pleural effusion is seen. Thoracic spondylosis. Mild osteopenia. IMPRESSION: No evidence of acute chest disease.  Mild cardiomegaly. Electronically Signed   By: Almira Bar M.D.   On: 03/23/2021 01:20    Procedures .Critical Care Performed by: Pollyann Savoy, MD Authorized by: Pollyann Savoy, MD   Critical care provider statement:    Critical  care time (minutes):  80   Critical care time was exclusive of:  Separately billable procedures and treating other patients   Critical care was necessary to treat or prevent imminent or life-threatening deterioration of the following conditions:  Cardiac failure and circulatory failure   Critical care was time spent personally by me on the following activities:  Development of treatment plan with patient or surrogate, discussions with consultants, evaluation of patient's response to treatment, examination of patient, ordering and performing treatments and interventions, ordering and review of laboratory studies, ordering and review of radiographic studies, pulse oximetry, re-evaluation of patient's condition and review of old charts   Care discussed with: admitting provider    Medications Ordered in the ED Medications  diltiazem (CARDIZEM) 1 mg/mL load via infusion 10 mg (10 mg Intravenous Bolus from Bag 03/23/21 0055)    And  diltiazem (CARDIZEM) 125 mg in dextrose 5% 125 mL (1 mg/mL) infusion (0 mg/hr Intravenous Stopped 03/23/21 0229)  amiodarone (NEXTERONE) IV bolus only 150 mg/100 mL (0 mg Intravenous Stopped 03/23/21 0239)     MDM Rules/Calculators/A&P MDM Patient with new onset rapid afib. BP is adequate, not in distress. Will give Cardizem bolus and drip. Check labs and CXR.   ED Course  I have reviewed the triage vital signs and the nursing notes.  Pertinent labs & imaging results that were available during my care of the patient were reviewed by me and considered in my medical decision making (see chart for details).  Clinical Course as of 03/23/21 0403  Mon Mar 23, 2021  0112 CBC with mild leukocytosis and mild anemia.  [CS]  0125 BNP only mildly elevated.  HR improving with Cardizem.  [CS]  0126 CXR is clear.  [CS]  0131 BMP with mild hypokalemia, otherwise unremarkable. Trop is neg.  [CS]  0145 Covid is neg. Patient's HR is improved but BP has dropped. Will decrease Cardizem  and give a 500cc fluid bolus.  [CS]  0213 BP remains low after IVF. Spoke with Dr. Lysle Morales, Cardiology, who recommends stopping the diltiazem and giving a 150mg  bolus of amiodarone. He agrees DCCV would be a back-up option.  [CS]  0311 BP remains soft, but patient has converted to SR. Will continue to monitor for improvement. Patient is feeling better.  [CS]  0328 Second Trop is mildly elevated, could be rate related, but given history of CAD, will plan admission for further management. BP is improved and patient is feeling much better.  [CS]  3086 Spoke with Dr. Thomes Dinning, Hospitalist, who will evaluate for admission.  [CS]    Clinical Course User Index [CS] Pollyann Savoy, MD    Final Clinical Impression(s) / ED Diagnoses Final diagnoses:  Atrial fibrillation with RVR [facility])    Rx / DC Orders ED Discharge Orders     None        Pollyann Savoy, MD 03/23/21 0403

## 2021-03-23 NOTE — Progress Notes (Signed)
ANTICOAGULATION CONSULT NOTE - Initial Consult  Pharmacy Consult for Heparin  Indication: New onset atrial fibrillation  No Known Allergies  Patient Measurements: Height: 5\' 1"  (154.9 cm) Weight: 108 kg (238 lb 1.6 oz) IBW/kg (Calculated) : 47.8   Vital Signs: Temp: 98 F (36.7 C) (11/07 0048) Temp Source: Oral (11/07 0048) BP: 96/73 (11/07 0400) Pulse Rate: 65 (11/07 0400)  Labs: Recent Labs    03/23/21 0049 03/23/21 0242  HGB 10.8*  --   HCT 33.5*  --   PLT 403*  --   CREATININE 0.83  --   TROPONINIHS 12 23*    Estimated Creatinine Clearance: 85.9 mL/min (by C-G formula based on SCr of 0.83 mg/dL).   Medical History: Past Medical History:  Diagnosis Date   Acute combined systolic and diastolic CHF, NYHA class 4 (HCC) 07/29/2020   a. EF 30% by echo in 07/2020 b. EF 55-60% by echo in 11/2020   Arthritis    Asthma    CAD (coronary artery disease)    a. 07/2020: s/p NSTEMI with DES to proxLAD and DES to proxLCx with medical management recommended of the occluded RCA.   Chronic bronchitis (HCC)    COPD (chronic obstructive pulmonary disease) (HCC)    Edema extremities    History of hiatal hernia    Hyperlipidemia LDL goal <70 07/29/2020   Ovarian cyst    Pneumonia      Assessment: 55 y/o F with cardiac history found to be in new onset atrial fibrillation. Starting anti-coagulation with Lovenox. Hgb 10.8. Plts good. Renal function good.   Goal of Therapy:  Monitor platelets by anticoagulation protocol: Yes   Plan:  Lovenox 1 mg/kg subcutaneous q12h Daily CBC  Monitor for bleeding F/U plans for oral anti-coagulation   58, PharmD, BCPS Clinical Pharmacist Phone: [phone]

## 2021-03-23 NOTE — Consult Note (Addendum)
Cardiology Consultation:   Patient ID: Kristine Montgomery MRN: [id number]; DOB: 1965/08/05  Admit date: 03/23/2021 Date of Consult: 03/23/2021  PCP:  Elfredia Nevins, MD   [facility] Providers Cardiologist:  Dietrich Pates, MD        Patient Profile:   Kristine Montgomery is a 55 y.o. female with a hx of CAD who is being seen 03/23/2021 for the evaluation of Afib with RVR at the request of Dr. Margo Aye.  History of Present Illness:   Kristine Montgomery is a 55 yo patient with history of CAD s/p NSTEMI in 07/2020 with DES to prox-LAD and DES to prox-LCx and medical management recommended of occluded RCA, ICM, HFrEF previously 30% echo 07/2020 and 55-60% 11/2020, HTN, HLD.  Patient had a stressful day yest emotionally as it was the 4 yr anniversary of her husbands passing. She developed palpitations about 11:00 pm and presented to ED and found to be in Afib with RVR. BP dropped with IV dilt so started on IV Amiodarone and converted to NSR. Troponins 23, 76,100. She denies any chest pain or symptoms similar to MI. Currently feeling well and denies cardiac symptoms. Not active because of knee problems and gets around in a wheel chair and walker/cane.   Past Medical History:  Diagnosis Date   Acute combined systolic and diastolic CHF, NYHA class 4 (HCC) 07/29/2020   a. EF 30% by echo in 07/2020 b. EF 55-60% by echo in 11/2020   Arthritis    Asthma    CAD (coronary artery disease)    a. 07/2020: s/p NSTEMI with DES to proxLAD and DES to proxLCx with medical management recommended of the occluded RCA.   Chronic bronchitis (HCC)    COPD (chronic obstructive pulmonary disease) (HCC)    Edema extremities    History of hiatal hernia    Hyperlipidemia LDL goal <70 07/29/2020   Ovarian cyst    Pneumonia     Past Surgical History:  Procedure Laterality Date   CESAREAN SECTION     CORONARY STENT INTERVENTION N/A 07/29/2020   Procedure: CORONARY STENT INTERVENTION;  Surgeon: Lennette Bihari, MD;  Location: [facility]  INVASIVE CV LAB;  Service: Cardiovascular;  Laterality: N/A;   KNEE ARTHROSCOPY WITH MEDIAL MENISECTOMY Right 05/23/2020   Procedure: KNEE ARTHROSCOPY WITH MEDIAL AND LATERAL MENISECTOMY; 3 COMPARTMENT SYNOVECTOMY;  Surgeon: Vickki Hearing, MD;  Location: [facility];  Service: Orthopedics;  Laterality: Right;   RIGHT/LEFT HEART CATH AND CORONARY ANGIOGRAPHY N/A 07/29/2020   Procedure: RIGHT/LEFT HEART CATH AND CORONARY ANGIOGRAPHY;  Surgeon: Lennette Bihari, MD;  Location: [facility] INVASIVE CV LAB;  Service: Cardiovascular;  Laterality: N/A;   TONSILLECTOMY     TUBAL LIGATION       Home Medications:  Prior to Admission medications   Medication Sig Start Date End Date Taking? Authorizing Provider  aspirin EC 81 MG tablet Take 1 tablet (81 mg total) by mouth daily. Swallow whole. 07/31/20   Bhagat, Sharrell Ku, PA  atorvastatin (LIPITOR) 80 MG tablet TAKE (1) TABLET BY MOUTH ONCE DAILY. 01/23/21   Bhagat, Bhavinkumar, PA  FARXIGA 10 MG TABS tablet TAKE 1 TABLET BY MOUTH ONCE A DAY. 01/23/21   Bhagat, Bhavinkumar, PA  furosemide (LASIX) 20 MG tablet TAKE (1) TABLET BY MOUTH ONCE DAILY. TAKE AN ADDITIONAL 1 TABLET FOR 3 POUND WEIGHT GAIN OVERNIGHT OR 5 POUNDS IN A WEEK. 12/24/20   Strader, [place] M, PA-C  metoprolol succinate (TOPROL-XL) 25 MG 24 hr tablet TAKE 1 TABLET BY MOUTH ONCE  DAILY 01/23/21   Bhagat, Crista Luria, PA  nitroGLYCERIN (NITROSTAT) 0.4 MG SL tablet Place 1 tablet (0.4 mg total) under the tongue every 5 (five) minutes x 3 doses as needed for chest pain. Patient not taking: Reported on 11/21/2020 07/31/20   Leanor Kail, PA  nitroGLYCERIN (NITROSTAT) 0.4 MG SL tablet PLACE 1 TABLET (0.4 MG TOTAL) UNDER THE TONGUE EVERY FIVE MINUTES X 3 DOSES AS NEEDED FOR CHEST PAIN. 07/31/20 07/31/21  Bhagat, Crista Luria, PA  sacubitril-valsartan (ENTRESTO) 24-26 MG TAKE 1 TABLET BY MOUTH TWO TIMES DAILY. 07/31/20 07/31/21  Leanor Kail, PA  spironolactone (ALDACTONE) 25 MG tablet TAKE 1/2 TABLET BY MOUTH  DAILY 03/13/21   Fay Records, MD  ticagrelor (BRILINTA) 90 MG TABS tablet TAKE 1 TABLET (90 MG TOTAL) BY MOUTH TWO TIMES DAILY. 07/31/20 07/31/21  Leanor Kail, PA    Inpatient Medications: Scheduled Meds:  aspirin EC  81 mg Oral Daily   atorvastatin  80 mg Oral q1800   enoxaparin (LOVENOX) injection  110 mg Subcutaneous Q12H   ticagrelor  90 mg Oral BID   Continuous Infusions:  PRN Meds:   Allergies:   No Known Allergies  Social History:   Social History   Socioeconomic History   Marital status: Single    Spouse name: Not on file   Number of children: 2   Years of education: 12   Highest education level: Not on file  Occupational History   Not on file  Tobacco Use   Smoking status: Former    Packs/day: 0.50    Years: 30.00    Pack years: 15.00    Types: Cigarettes   Smokeless tobacco: Never   Tobacco comments:    pt states last cigarette was 3/13, prior to coming into hospital. Pt plans to continue cessation.  Vaping Use   Vaping Use: Never used  Substance and Sexual Activity   Alcohol use: Not Currently    Comment: occasional   Drug use: No   Sexual activity: Yes    Birth control/protection: Post-menopausal, Surgical    Comment: tubal  Other Topics Concern   Not on file  Social History Narrative   Not on file   Social Determinants of Health   Financial Resource Strain: Low Risk    Difficulty of Paying Living Expenses: Not very hard  Food Insecurity: No Food Insecurity   Worried About Charity fundraiser in the Last Year: Never true   Ran Out of Food in the Last Year: Never true  Transportation Needs: No Transportation Needs   Lack of Transportation (Medical): No   Lack of Transportation (Non-Medical): No  Physical Activity: Insufficiently Active   Days of Exercise per Week: 1 day   Minutes of Exercise per Session: 10 min  Stress: No Stress Concern Present   Feeling of Stress : Not at all  Social Connections: Socially Isolated   Frequency of  Communication with Friends and Family: More than three times a week   Frequency of Social Gatherings with Friends and Family: More than three times a week   Attends Religious Services: Never   Marine scientist or Organizations: No   Attends Archivist Meetings: Never   Marital Status: Widowed  Human resources officer Violence: Not At Risk   Fear of Current or Ex-Partner: No   Emotionally Abused: No   Physically Abused: No   Sexually Abused: No    Family History:     Family History  Problem Relation Age of Onset  Cancer Paternal Grandfather        colon   Cancer Maternal Grandmother        lung   COPD Father    Non-Hodgkin's lymphoma Mother    Diabetes Mother    Cancer Mother        cervical   Congestive Heart Failure Mother    Hypertension Sister    Other Son        fluid around heart     ROS:  Please see the history of present illness.  Review of Systems  Constitutional: Negative.  HENT: Negative.    Eyes: Negative.   Cardiovascular:  Positive for palpitations.  Respiratory: Negative.    Hematologic/Lymphatic: Negative.   Musculoskeletal:  Positive for arthritis, gout, joint swelling and stiffness. Negative for joint pain.  Gastrointestinal: Negative.   Genitourinary: Negative.   Neurological: Negative.    All other ROS reviewed and negative.     Physical Exam/Data:   Vitals:   03/23/21 0424 03/23/21 0430 03/23/21 0535 03/23/21 0624  BP:  (!) 92/59 (!) 65/55 110/67  Pulse:  64 64 66  Resp:  17  16  Temp:  98 F (36.7 C) 98.3 F (36.8 C) 98.1 F (36.7 C)  TempSrc:  Oral Oral Oral  SpO2: 99% 100% 98% 100%  Weight:   107.2 kg   Height:   5\' 1"  (1.549 m)     Intake/Output Summary (Last 24 hours) at 03/23/2021 1050 Last data filed at 03/23/2021 0900 Gross per 24 hour  Intake 359.37 ml  Output --  Net 359.37 ml   Last 3 Weights 03/23/2021 03/23/2021 03/08/2021  Weight (lbs) 236 lb 5.3 oz 238 lb 1.6 oz 238 lb  Weight (kg) 107.2 kg 108 kg  107.956 kg     Body mass index is 44.65 kg/m.  General:  Obese, in no acute distress  HEENT: normal Neck: no JVD Vascular: No carotid bruits; Distal pulses 2+ bilaterally Cardiac:  normal S1, S2; RRR; no murmur   Lungs:  clear to auscultation bilaterally, no wheezing, rhonchi or rales  Abd: soft, nontender, no hepatomegaly  Ext: no edema Musculoskeletal:  No deformities, BUE and BLE strength normal and equal Skin: warm and dry  Neuro:  CNs 2-12 intact, no focal abnormalities noted Psych:  Normal affect   EKG:  The EKG was personally reviewed and demonstrates:  Afib with RVR Telemetry:  Telemetry was personally reviewed and demonstrates:  NSR  Relevant CV Studies: Echocardiogram: 07/2020 IMPRESSIONS     1. Poor acoustic windows limit study Definity used. Overall LVEF is  approximately 30% with diffuse hypokinesis worse in the septal, mid/distal  anterior, distal inferior, distal lateral and apical walls. . The left  ventricular internal cavity size was  mildly to moderately dilated. Left ventricular diastolic parameters are  indeterminate.   2. Right ventricular systolic function is normal. The right ventricular  size is normal.   3. Mild mitral valve regurgitation.   4. The aortic valve is tricuspid. Aortic valve regurgitation is not  visualized. Mild aortic valve sclerosis is present, with no evidence of  aortic valve stenosis.   5. The inferior vena cava is dilated in size with >50% respiratory  variability, suggesting right atrial pressure of 8 mmHg.    Cardiac Catheterization: 07/2020 Prox LAD lesion is 95% stenosed. Ramus lesion is 40% stenosed. Ost LAD lesion is 20% stenosed. Ost Cx lesion is 30% stenosed. Prox Cx lesion is 95% stenosed. Prox RCA to Mid RCA lesion is 100%  stenosed. Post intervention, there is a 0% residual stenosis. Post intervention, there is a 0% residual stenosis. A stent was successfully placed. A stent was successfully placed.   Severe  multivessel CAD with 30% smooth ostial narrowing of the LAD followed by focal 95% eccentric proximal stenosis; smooth 40% ostial narrowing of a ramus intermediate vessel; 30% ostial narrowing of the circumflex followed by 95% proximal stenosis on a bend in the proximal circumflex; and total Lee occluded proximal RCA with both bridging antegrade collaterals as well as retrograde collaterals supplying the distal RCA.   Moderately elevated right heart pressures with very hypertension with a mean PA pressure at 41 mm.   Successful two-vessel coronary intervention involving a 95% proximal LAD stenosis with insertion of a 3.5 x 15 mm Resolute Onyx stent postdilated to 3.75 mm and 95% proximal left circumflex stenosis with insertion of a 3.0 x 15 mm Resolute Onyx stent postdilated to 3.3 mm with the stenoses being reduced to 0%.   RECOMMENDATION: DAPT with aspirin/Brilinta for minimum of 1 year.  Bivalirudin will be discontinued upon completion of the current bag with sheath pull several hours later.  Patient was given furosemide 40 mg IV at the completion of the procedure.  She will be started on carvedilol 3.125 mg twice a day and either ARB or Entresto tomorrow.  Hopefully LV function will improve with revascularization of both the LAD and circumflex vessels.   Echocardiogram: 11/2020 IMPRESSIONS     1. Limited study.   2. Left ventricular ejection fraction, by estimation, is 55 to 60%. The  left ventricle has normal function. The left ventricle demonstrates  regional wall motion abnormalities (see scoring diagram/findings for  description).   3. Right ventricular systolic function is normal. The right ventricular  size is normal.   4. A small pericardial effusion is present. The pericardial effusion is  circumferential.   5. The inferior vena cava is normal in size with greater than 50%  respiratory variability, suggesting right atrial pressure of 3 mmHg.     Laboratory Data:  High  Sensitivity Troponin:   Recent Labs  Lab 03/23/21 0049 03/23/21 0242 03/23/21 0618 03/23/21 0812  TROPONINIHS 12 23* 76* 100*     Chemistry Recent Labs  Lab 03/23/21 0047 03/23/21 0049  NA  --  134*  K  --  3.1*  CL  --  102  CO2  --  19*  GLUCOSE  --  134*  BUN  --  15  CREATININE  --  0.83  CALCIUM  --  8.6*  MG 2.0  --   GFRNONAA  --  >60  ANIONGAP  --  13    No results for input(s): PROT, ALBUMIN, AST, ALT, ALKPHOS, BILITOT in the last 168 hours. Lipids No results for input(s): CHOL, TRIG, HDL, LABVLDL, LDLCALC, CHOLHDL in the last 168 hours.  Hematology Recent Labs  Lab 03/23/21 0049  WBC 12.2*  RBC 3.89  HGB 10.8*  HCT 33.5*  MCV 86.1  MCH 27.8  MCHC 32.2  RDW 15.2  PLT 403*   Thyroid No results for input(s): TSH, FREET4 in the last 168 hours.  BNP Recent Labs  Lab 03/23/21 0049  BNP 105.0*    DDimer No results for input(s): DDIMER in the last 168 hours.   Radiology/Studies:  DG Chest Port 1 View  Result Date: 03/23/2021 CLINICAL DATA:  Chest pain. EXAM: PORTABLE CHEST 1 VIEW COMPARISON:  Portable chest 07/28/2020 FINDINGS: There is mild cardiomegaly with coronary artery  stents on the left and no vascular congestion findings, with interval resolution of prior interstitial edema and vascular distension. The lungs are clear. No pleural effusion is seen. Thoracic spondylosis. Mild osteopenia. IMPRESSION: No evidence of acute chest disease.  Mild cardiomegaly. Electronically Signed   By: Telford Nab M.D.   On: 03/23/2021 01:20     Assessment and Plan:   Afib with RVR converted to NSR with IV Amiodarone, K 3.1 replaced, hypotensive with IV diltiazem. On Toprol XL 25 mg PTA, CHA2DS2-VASc equals 5 so will need to be on Eliquis.  Stop aspirin since she is on Brilinta. Discussed with Dr. Jonne Ply change Brilinta to Plavix since we will start Eliquis.  Elevated troponins asymptomatic without chest pain.  Probably demand ischemia.  No work-up  needed at this time.  CAD s/p NSTEMI in 07/2020 with DES to prox-LAD and DES to prox-LCx and medical management recommended of occluded RCA on Brilinta and ASA-switching to Plavix and stopping ASA  ICM EF 30% on echo 07/2020 improved 55-60% echo 11/2020 on entresto/spironolactone/farxiga would resume meds since blood pressure has improved. Resume entresto and farxiga.hold spironolactone for now as we monitor BP  HTN controlled with above meds  HLD  on lipitor  Tobacco-quit   Risk Assessment/Risk Scores:          CHA2DS2-VASc Score = 5   This indicates a 7.2% annual risk of stroke. The patient's score is based upon: CHF History: 1 HTN History: 1 Diabetes History: 1 Stroke History: 0 Vascular Disease History: 1 Age Score: 0 Gender Score: 1        For questions or updates, please contact [facility] Please consult [email or web address] for contact info under    Signed, Ermalinda Barrios, PA-C  03/23/2021 10:50 AM   Personally seen and examined. Agree with APP above with the following comments: Briefly 55 yo F with a history of Morbid Obesity, CAD (3/22 LAD PCI, LCX PCI, CTO RCA), ICM HFrEF who presents with CP found to have new PAF; hypotensive requiring Amiodarone and s/p chemical cardioversion. Patient notes that she feels fine now but she was scared: given her new PAF and other heart problems she is worried about her long term prognosis.  No CP, SOB, Palpitations, notes that on DAPT dose bruise more easily. Exam notable for Morbid Obesity, regular rhythm, sad affect and mood small bruse over L forearm. Labs notable for HS troponin of 70-> 100 likely related to RVR Personally reviewed relevant tests; AF RV over weekend, based on EKGs reviewed and telemetry, converted in early AM 03/23/21.   VTACH on telemetry is artifact  Would recommend  - given CHADSVASC, PCI in 3/22 and bleeding risk, would transition to Eliquis and plavix (ordered) - Returned SGLT2i and patient will keep  statin, will DC ASA at discharge - planned for addition of aldactone 03/24/21 if tolerated home meds - will need amiodarone 400 mg PO BID for two weeks then 200 mg PO Daily for suppression, long term would be a candidate for Tikoysn or Sotalol; given her HF and sx a Rhythm control strategy is favored - potential 03/24/21 Sign off - discussed with patient and family  Rudean Haskell, MD [place]  [address] [place], [place] [address] [phone]  12:05 PM

## 2021-03-23 NOTE — NC FL2 (Signed)
[place] LEVEL OF CARE SCREENING TOOL     IDENTIFICATION  Patient Name: Kristine Montgomery Birthdate: [birth year] Sex: female Admission Date (Current Location): 03/23/2021  [facility] and [place] Number:  [employer] and Address:  [facility] [address], [place]      Provider Number: [id number]  Attending Physician Name and Address:  Kayleen Memos, DO  Relative Name and Phone Number:       Current Level of Care: Hospital Recommended Level of Care: [place] Prior Approval Number:    Date Approved/Denied:   PASRR Number:    Discharge Plan: SNF    Current Diagnoses: Patient Active Problem List   Diagnosis Date Noted   Paroxysmal atrial fibrillation ([place]) 03/23/2021   Hypokalemia 03/23/2021   Leukocytosis 03/23/2021   Thrombocytosis 03/23/2021   Elevated troponin 03/23/2021   Hyperglycemia 03/23/2021   Ischemic cardiomyopathy 07/31/2020   Coronary artery disease involving native coronary artery of native heart with unstable angina pectoris ([place]) 07/31/2020   Nocturnal hypoxemia due to obesity 07/31/2020   Morbid obesity ([place]) 07/31/2020   Tobacco abuse 07/31/2020   Acute combined systolic and diastolic CHF, NYHA class 4 ([place]) 07/29/2020   Mixed hyperlipidemia 07/29/2020   NSTEMI (non-ST elevated myocardial infarction) ([place]) 07/29/2020   Unstable angina ([place]) 07/28/2020   S/P right knee arthroscopy 05/23/20 05/28/2020   Tear of meniscus of knee joint    Synovitis of knee    Primary osteoarthritis of right knee    Encounter for screening fecal occult blood testing 04/09/2020   Encounter for gynecological examination with Papanicolaou smear of cervix 04/09/2020   Urinary tract infection without hematuria 02/27/2020   Dysuria 02/27/2020   Urge incontinence 04/10/2018   OAB (overactive bladder) 04/10/2018   Urinary frequency 04/10/2018   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 07/15/2015     Orientation RESPIRATION BLADDER Height & Weight     Self, Time, Situation, Place  Normal Continent Weight: 236 lb 5.3 oz (107.2 kg) Height:  5\' 1"  (154.9 cm)  BEHAVIORAL SYMPTOMS/MOOD NEUROLOGICAL BOWEL NUTRITION STATUS      Continent Diet (see dc summary)  AMBULATORY STATUS COMMUNICATION OF NEEDS Skin   Extensive Assist Verbally Normal                       Personal Care Assistance Level of Assistance  Bathing, Feeding, Dressing Bathing Assistance: Limited assistance Feeding assistance: Independent Dressing Assistance: Limited assistance     Functional Limitations Info  Sight, Hearing, Speech Sight Info: Adequate Hearing Info: Adequate Speech Info: Adequate    SPECIAL CARE FACTORS FREQUENCY  PT (By licensed PT), OT (By licensed OT)     PT Frequency: 5x week OT Frequency: 3x week            Contractures Contractures Info: Not present    Additional Factors Info  Code Status, Allergies Code Status Info: Full Allergies Info: NKA           Current Medications (03/23/2021):  This is the current hospital active medication list Current Facility-Administered Medications  Medication Dose Route Frequency Provider Last Rate Last Admin   [START ON 04/06/2021] amiodarone (PACERONE) tablet 200 mg  200 mg Oral Daily Chandrasekhar, Mahesh A, MD       amiodarone (PACERONE) tablet 400 mg  400 mg Oral BID Chandrasekhar, Mahesh A, MD   400 mg at 03/23/21 1248   apixaban (ELIQUIS) tablet 5 mg  5 mg  Oral BID Dyann Kief, PA-C   5 mg at 03/23/21 1249   atorvastatin (LIPITOR) tablet 80 mg  80 mg Oral q1800 Adefeso, Olad[place], DO       [START ON 03/24/2021] clopidogrel (PLAVIX) tablet 75 mg  75 mg Oral Daily Jacolyn Reedy M, PA-C       dapagliflozin propanediol (FARXIGA) tablet 10 mg  10 mg Oral Daily Jacolyn Reedy M, PA-C   10 mg at 03/23/21 1249   metoprolol succinate (TOPROL-XL) 24 hr tablet 25 mg  25 mg Oral Daily Jacolyn Reedy M, PA-C   25 mg at 03/23/21 1248    sacubitril-valsartan (ENTRESTO) 24-26 mg per tablet  1 tablet Oral BID Dyann Kief, PA-C   1 tablet at 03/23/21 1248   ticagrelor (BRILINTA) tablet 90 mg  90 mg Oral BID Dyann Kief, PA-C         Discharge Medications: Please see discharge summary for a list of discharge medications.  Relevant Imaging Results:  Relevant Lab Results:   Additional Information SSN: [id number], [place]

## 2021-03-24 DIAGNOSIS — I480 Paroxysmal atrial fibrillation: Secondary | ICD-10-CM | POA: Diagnosis not present

## 2021-03-24 LAB — CBC
HCT: 29.3 % — ABNORMAL LOW (ref 36.0–46.0)
Hemoglobin: 9.1 g/dL — ABNORMAL LOW (ref 12.0–15.0)
MCH: 27.1 pg (ref 26.0–34.0)
MCHC: 31.1 g/dL (ref 30.0–36.0)
MCV: 87.2 fL (ref 80.0–100.0)
Platelets: 343 10*3/uL (ref 150–400)
RBC: 3.36 MIL/uL — ABNORMAL LOW (ref 3.87–5.11)
RDW: 15.2 % (ref 11.5–15.5)
WBC: 7.3 10*3/uL (ref 4.0–10.5)
nRBC: 0 % (ref 0.0–0.2)

## 2021-03-24 LAB — GLUCOSE, CAPILLARY
Glucose-Capillary: 125 mg/dL — ABNORMAL HIGH (ref 70–99)
Glucose-Capillary: 144 mg/dL — ABNORMAL HIGH (ref 70–99)

## 2021-03-24 LAB — COMPREHENSIVE METABOLIC PANEL
ALT: 8 U/L (ref 0–44)
AST: 10 U/L — ABNORMAL LOW (ref 15–41)
Albumin: 2.4 g/dL — ABNORMAL LOW (ref 3.5–5.0)
Alkaline Phosphatase: 83 U/L (ref 38–126)
Anion gap: 4 — ABNORMAL LOW (ref 5–15)
BUN: 13 mg/dL (ref 6–20)
CO2: 24 mmol/L (ref 22–32)
Calcium: 8.4 mg/dL — ABNORMAL LOW (ref 8.9–10.3)
Chloride: 111 mmol/L (ref 98–111)
Creatinine, Ser: 0.62 mg/dL (ref 0.44–1.00)
GFR, Estimated: >60 mL/min (ref >60)
Glucose, Bld: 107 mg/dL — ABNORMAL HIGH (ref 70–99)
Potassium: 3.6 mmol/L (ref 3.5–5.1)
Sodium: 139 mmol/L (ref 135–145)
Total Bilirubin: 0.6 mg/dL (ref 0.3–1.2)
Total Protein: 6.9 g/dL (ref 6.5–8.1)

## 2021-03-24 LAB — URIC ACID: Uric Acid, Serum: 4.4 mg/dL (ref 2.5–7.1)

## 2021-03-24 LAB — HEMOGLOBIN A1C
Hgb A1c MFr Bld: 5.3 % (ref 4.8–5.6)
Mean Plasma Glucose: 105.41 mg/dL

## 2021-03-24 LAB — TSH: TSH: 3.457 u[IU]/mL (ref 0.350–4.500)

## 2021-03-24 MED ORDER — SENNOSIDES-DOCUSATE SODIUM 8.6-50 MG PO TABS
1.0000 | ORAL_TABLET | Freq: Every day | ORAL | Status: DC
  Administered 2021-03-24 – 2021-03-27 (×3): 1 via ORAL
  Filled 2021-03-24 (×4): qty 1

## 2021-03-24 MED ORDER — METOPROLOL SUCCINATE ER 25 MG PO TB24
12.5000 mg | ORAL_TABLET | Freq: Every day | ORAL | Status: DC
  Administered 2021-03-24 – 2021-03-26 (×3): 12.5 mg via ORAL
  Filled 2021-03-24 (×5): qty 1

## 2021-03-24 MED ORDER — OXYCODONE HCL 5 MG PO TABS
5.0000 mg | ORAL_TABLET | ORAL | Status: DC | PRN
  Administered 2021-03-24: 5 mg via ORAL
  Filled 2021-03-24: qty 1

## 2021-03-24 MED ORDER — HYDROMORPHONE HCL 1 MG/ML IJ SOLN: 0.5000 mg | INTRAMUSCULAR | Status: DC | PRN

## 2021-03-24 MED ORDER — INSULIN ASPART 100 UNIT/ML IJ SOLN
0.0000 [IU] | Freq: Three times a day (TID) | INTRAMUSCULAR | Status: DC
  Administered 2021-03-24 – 2021-03-25 (×2): 1 [IU] via SUBCUTANEOUS
  Administered 2021-03-25: 2 [IU] via SUBCUTANEOUS
  Administered 2021-03-25 – 2021-03-27 (×4): 1 [IU] via SUBCUTANEOUS

## 2021-03-24 MED ORDER — METHYLPREDNISOLONE SODIUM SUCC 40 MG IJ SOLR
40.0000 mg | Freq: Two times a day (BID) | INTRAMUSCULAR | Status: DC
  Administered 2021-03-24 – 2021-03-27 (×6): 40 mg via INTRAVENOUS
  Filled 2021-03-24 (×6): qty 1

## 2021-03-24 MED ORDER — SPIRONOLACTONE 25 MG PO TABS
12.5000 mg | ORAL_TABLET | Freq: Every day | ORAL | Status: DC
  Administered 2021-03-24 – 2021-03-28 (×5): 12.5 mg via ORAL
  Filled 2021-03-24: qty 1
  Filled 2021-03-24 (×2): qty 0.5
  Filled 2021-03-24 (×2): qty 1
  Filled 2021-03-24: qty 0.5
  Filled 2021-03-24: qty 1
  Filled 2021-03-24: qty 0.5
  Filled 2021-03-24: qty 1
  Filled 2021-03-24 (×3): qty 0.5

## 2021-03-24 MED ORDER — INSULIN ASPART 100 UNIT/ML IJ SOLN: 0.0000 [IU] | Freq: Every day | INTRAMUSCULAR | Status: DC

## 2021-03-24 NOTE — Progress Notes (Signed)
PROGRESS NOTE  Kristine Montgomery [id number] DOB: 26-Oct-1965 DOA: 03/23/2021 PCP: [employer], MD  HPI/Recap of past 24 hours:  Kristine Montgomery is a 55 y.o. female with medical history significant for NSTEMI, CAD status post stent placement, ischemic cardiomyopathy, combined systolic and diastolic CHF who presents to the emergency department due to palpitations which started few hours prior to arrival to the ED.  Work-up revealed new onset atrial fibrillation for which she was started on Cardizem drip, due to hypotension was switched to amiodarone drip then converted to sinus rhythm.  She was seen by cardiology and started on Eliquis for CVA prevention.  PT OT assessed and recommended SNF.  TOC assisting with SNF placement.  Hospital course complicated by left elbow and left ankle edema and tenderness with slight touch.  No prior history of gout however concern for developing gout.  Uric acid ordered and pending, started on IV Solu-Medrol due to concern for acute gout flare.   Assessment/Plan: Principal Problem:   Paroxysmal atrial fibrillation (HCC) Active Problems:   Acute combined systolic and diastolic CHF, NYHA class 4 (HCC)   Mixed hyperlipidemia   NSTEMI (non-ST elevated myocardial infarction) ([place])   Ischemic cardiomyopathy   Coronary artery disease involving native coronary artery of native heart with unstable angina pectoris (HCC)   Morbid obesity (HCC)   Tobacco abuse   Hypokalemia   Leukocytosis   Thrombocytosis   Elevated troponin   Hyperglycemia  New onset, newly diagnosed paroxysmal atrial fibrillation In A. fib with RVR on presentation, initially received Cardizem drip, became hypotensive then was switched to amiodarone drip then she converted to sinus rhythm. CHA2DS2-VASc of 5, she was started on Eliquis by cardiology on 03/23/2021 She is on oral amiodarone for rhythm control On Toprol-XL for rate control.  Elevated troponin, suspect demand ischemia in the  setting of A. fib with RVR Management per cardiology Continue Plavix, Eliquis, statin.  Ischemic cardiomyopathy with acute combined systolic and diastolic CHF Managed by cardiology She is currently on Toprol-XL 12.5 mg daily, Entresto 24-26 mg twice daily, spironolactone 12.5 mg daily, Plavix 75 mg daily, Lipitor 80 mg daily, amiodarone, and Eliquis. Strict I's and O's and daily weight  Coronary artery disease status post PCI with stent placement on March 2022 Denies any anginal symptoms at the time of the visit Patient was on aspirin and Brilinta due to addition to Eliquis for her paroxysmal A. fib aspirin and Brilinta was stopped and Plavix was added, continue Eliquis, Plavix, and high intensity statin.  Suspected gout flare, no prior history of gout Obtain uric acid Start IV Solu-Medrol 40 mg twice daily  Ambulatory dysfunction PT OT assessed and recommending SNF TOC assisting with DC planning  Prediabetes, will likely be exacerbated by IV Solu-Medrol Started on Farxiga by cardiology Added insulin sliding scale for hyperglycemia.   Code Status: Full code  Family Communication: Daughter at bedside  Disposition Plan: Likely will discharge to SNF once bed is available, possibly 03/25/2021.   Consultants: Cardiology, signed off on 03/24/2021.  Procedures: None  Antimicrobials: None  DVT prophylaxis: Eliquis  Status is: Observation        Objective: Vitals:   03/23/21 1346 03/23/21 2029 03/24/21 0434 03/24/21 1322  BP: 120/63 135/65 (!) 122/53 132/64  Pulse: 66 69 (!) 57 (!) 54  Resp:  20 20 15   Temp: 99 F (37.2 C) 98.3 F (36.8 C) 98.2 F (36.8 C) 98.4 F (36.9 C)  TempSrc: Oral Oral    SpO2: 98%  98% 94% 98%  Weight:      Height:        Intake/Output Summary (Last 24 hours) at 03/24/2021 1810 Last data filed at 03/24/2021 1300 Gross per 24 hour  Intake 720 ml  Output 200 ml  Net 520 ml   Filed Weights   03/23/21 0045 03/23/21 0535  Weight:  108 kg 107.2 kg    Exam:  General: 55 y.o. year-old female well developed well nourished in no acute distress.  Alert and oriented x3. Cardiovascular: Regular rate and rhythm with no rubs or gallops.  No thyromegaly or JVD noted.   Respiratory: Clear to auscultation with no wheezes or rales. Good inspiratory effort. Abdomen: Soft nontender nondistended with normal bowel sounds x4 quadrants. Musculoskeletal: Left ankle edema.  Left elbow edema. 2/4 pulses in all 4 extremities. Skin: No ulcerative lesions noted or rashes, Psychiatry: Mood is appropriate for condition and setting   Data Reviewed: CBC: Recent Labs  Lab 03/23/21 0049 03/24/21 0520  WBC 12.2* 7.3  NEUTROABS 7.9*  --   HGB 10.8* 9.1*  HCT 33.5* 29.3*  MCV 86.1 87.2  PLT 403* 343   Basic Metabolic Panel: Recent Labs  Lab 03/23/21 0047 03/23/21 0049 03/24/21 0520  NA  --  134* 139  K  --  3.1* 3.6  CL  --  102 111  CO2  --  19* 24  GLUCOSE  --  134* 107*  BUN  --  15 13  CREATININE  --  0.83 0.62  CALCIUM  --  8.6* 8.4*  MG 2.0  --   --   PHOS 3.1  --   --    GFR: Estimated Creatinine Clearance: 88.8 mL/min (by C-G formula based on SCr of 0.62 mg/dL). Liver Function Tests: Recent Labs  Lab 03/24/21 0520  AST 10*  ALT 8  ALKPHOS 83  BILITOT 0.6  PROT 6.9  ALBUMIN 2.4*   No results for input(s): LIPASE, AMYLASE in the last 168 hours. No results for input(s): AMMONIA in the last 168 hours. Coagulation Profile: No results for input(s): INR, PROTIME in the last 168 hours. Cardiac Enzymes: No results for input(s): CKTOTAL, CKMB, CKMBINDEX, TROPONINI in the last 168 hours. BNP (last 3 results) No results for input(s): PROBNP in the last 8760 hours. HbA1C: Recent Labs    03/24/21 1336  HGBA1C 5.3   CBG: Recent Labs  Lab 03/24/21 1645  GLUCAP 125*   Lipid Profile: No results for input(s): CHOL, HDL, LDLCALC, TRIG, CHOLHDL, LDLDIRECT in the last 72 hours. Thyroid Function Tests: Recent  Labs    03/24/21 0520  TSH 3.457   Anemia Panel: No results for input(s): VITAMINB12, FOLATE, FERRITIN, TIBC, IRON, RETICCTPCT in the last 72 hours. Urine analysis:    Component Value Date/Time   COLORURINE YELLOW 08/05/2016 1747   APPEARANCEUR Clear 02/27/2020 1530   LABSPEC 1.021 08/05/2016 1747   PHURINE 5.0 08/05/2016 1747   GLUCOSEU Negative 02/27/2020 1530   HGBUR NEGATIVE 08/05/2016 1747   BILIRUBINUR Negative 02/27/2020 1530   KETONESUR NEGATIVE 08/05/2016 1747   PROTEINUR Trace 02/27/2020 1530   PROTEINUR NEGATIVE 08/05/2016 1747   NITRITE Negative 02/27/2020 1530   NITRITE NEGATIVE 08/05/2016 1747   LEUKOCYTESUR 1+ (A) 02/27/2020 1530   Sepsis Labs: @LABRCNTIP (procalcitonin:4,lacticidven:4)  ) Recent Results (from the past 240 hour(s))  Resp Panel by RT-PCR (Flu A&B, Covid) Nasopharyngeal Swab     Status: None   Collection Time: 03/23/21 12:49 AM   Specimen: Nasopharyngeal Swab; Nasopharyngeal(NP) swabs  in vial transport medium  Result Value Ref Range Status   SARS Coronavirus 2 by RT PCR NEGATIVE NEGATIVE Final    Comment: (NOTE) SARS-CoV-2 target nucleic acids are NOT DETECTED.  The SARS-CoV-2 RNA is generally detectable in upper respiratory specimens during the acute phase of infection. The lowest concentration of SARS-CoV-2 viral copies this assay can detect is 138 copies/mL. A negative result does not preclude SARS-Cov-2 infection and should not be used as the sole basis for treatment or other patient management decisions. A negative result may occur with  improper specimen collection/handling, submission of specimen other than nasopharyngeal swab, presence of viral mutation(s) within the areas targeted by this assay, and inadequate number of viral copies(<138 copies/mL). A negative result must be combined with clinical observations, patient history, and epidemiological information. The expected result is Negative.  Fact Sheet for Patients:   EntrepreneurPulse.com.au  Fact Sheet for Healthcare Providers:  IncredibleEmployment.be  This test is no t yet approved or cleared by the [place] FDA and  has been authorized for detection and/or diagnosis of SARS-CoV-2 by FDA under an Emergency Use Authorization (EUA). This EUA will remain  in effect (meaning this test can be used) for the duration of the COVID-19 declaration under Section 564(b)(1) of the Act, 21 U.S.C.section 360bbb-3(b)(1), unless the authorization is terminated  or revoked sooner.       Influenza A by PCR NEGATIVE NEGATIVE Final   Influenza B by PCR NEGATIVE NEGATIVE Final    Comment: (NOTE) The Xpert Xpress SARS-CoV-2/FLU/RSV plus assay is intended as an aid in the diagnosis of influenza from Nasopharyngeal swab specimens and should not be used as a sole basis for treatment. Nasal washings and aspirates are unacceptable for Xpert Xpress SARS-CoV-2/FLU/RSV testing.  Fact Sheet for Patients: EntrepreneurPulse.com.au  Fact Sheet for Healthcare Providers: IncredibleEmployment.be  This test is not yet approved or cleared by the [place] FDA and has been authorized for detection and/or diagnosis of SARS-CoV-2 by FDA under an Emergency Use Authorization (EUA). This EUA will remain in effect (meaning this test can be used) for the duration of the COVID-19 declaration under Section 564(b)(1) of the Act, 21 U.S.C. section 360bbb-3(b)(1), unless the authorization is terminated or revoked.  Performed at [facility], [address], [place], [place] [address]       Studies: No results found.  Scheduled Meds:  [START ON 04/06/2021] amiodarone  200 mg Oral Daily   amiodarone  400 mg Oral BID   apixaban  5 mg Oral BID   atorvastatin  80 mg Oral q1800   clopidogrel  75 mg Oral Daily   dapagliflozin propanediol  10 mg Oral Daily   insulin aspart  0-5 Units Subcutaneous QHS    insulin aspart  0-9 Units Subcutaneous TID WC   methylPREDNISolone (SOLU-MEDROL) injection  40 mg Intravenous Q12H   metoprolol succinate  12.5 mg Oral Daily   sacubitril-valsartan  1 tablet Oral BID   senna-docusate  1 tablet Oral QHS   spironolactone  12.5 mg Oral Daily    Continuous Infusions:   LOS: 0 days     Kayleen Memos, MD [facility] Pager [phone]  If 7PM-7AM, please contact night-coverage [email or web address] Password [facility] 03/24/2021, 6:10 PM

## 2021-03-24 NOTE — Progress Notes (Signed)
Physical Therapy Treatment Patient Details Name: Kristine Montgomery MRN: [id number] DOB: March 26, 1966 Today's Date: 03/24/2021   History of Present Illness Kristine Montgomery is a 55 y.o. female who presents to the due to palpitations. Pt admitted with afib wtih RVR. PMH: NSTEMI, CAD s/p stent placement, ischemic cardiomyopathy, combined systolic and diastolic CHF    PT Comments    Pt initially nervous to mobilize due to pain in bil feet, knees and hands with LUE worse than RUE, but agreeable with encouragement and education. Pt requires min A to come to sitting EOB, increased time and use of bedrail; mod A to lift BLE back into bed. Pt powers to stand with RW and min guard, significant bil knee pain and maintains trunk/hips/knees in forward flexed posture. Pt takes sidesteps up to [facility] with min A, c/o bil knee pain in WB but also limited with LUE use due to pain requiring return to sitting. Pt tolerates seated BLE strengthening exercises with minimal pain complaints. Continue to progress acute PT as able.   Recommendations for follow up therapy are one component of a multi-disciplinary discharge planning process, led by the attending physician.  Recommendations may be updated based on patient status, additional functional criteria and insurance authorization.  Follow Up Recommendations  Skilled nursing-short term rehab (<3 hours/day)     Assistance Recommended at Discharge Frequent or constant Supervision/Assistance  Equipment Recommendations  None recommended by PT    Recommendations for Other Services       Precautions / Restrictions Precautions Precautions: Fall Restrictions Weight Bearing Restrictions: No     Mobility  Bed Mobility Overal bed mobility: Needs Assistance Bed Mobility: Supine to Sit;Sit to Supine  Supine to sit: Min assist;HOB elevated Sit to supine: Mod assist   General bed mobility comments: min A to upright trunk into sitting, use of bedrail and elevated HOB; mod A  to lift BLE back into bed and reposition to comfort using bed pad    Transfers Overall transfer level: Needs assistance Equipment used: Rolling walker (2 wheels) Transfers: Sit to/from Stand Sit to Stand: Min guard  General transfer comment: powers to stand with rocking momentum and reliant on BUE assisting, painful bil knees and feet, maintains trunk forward flexed    Ambulation/Gait Ambulation/Gait assistance: Min assist  Assistive device: Rolling walker (2 wheels) Gait Pattern/deviations: Step-to pattern;Trunk flexed Gait velocity: decreased  General Gait Details: forward flexed trunk, takes 4 sidesteps up to [facility] with RW, dependent on RW to steady self and limited LUE use due to significant pain, limited by bil knee pain and declines further ambulation   Stairs             Wheelchair Mobility    Modified Rankin (Stroke Patients Only)       Balance Overall balance assessment: Needs assistance  Sitting balance-Leahy Scale: Good  Standing balance support: During functional activity;Reliant on assistive device for balance;Bilateral upper extremity supported Standing balance-Leahy Scale: Poor     Cognition Arousal/Alertness: Awake/alert Behavior During Therapy: WFL for tasks assessed/performed;Anxious Overall Cognitive Status: Within Functional Limits for tasks assessed       Exercises General Exercises - Lower Extremity Long Arc Quad: Seated;AROM;Strengthening;Both;10 reps Hip Flexion/Marching: Seated;AROM;Strengthening;Both;10 reps Heel Raises: Seated;AROM;Strengthening;Both;10 reps    General Comments        Pertinent Vitals/Pain Pain Assessment: 0-10 Pain Score: 9  Pain Location: bil feet, knees and hands (LUE>RUE) Pain Descriptors / Indicators: Aching;Sore;Tender Pain Intervention(s): Limited activity within patient's tolerance;Monitored during session;Repositioned    Home  Living                          Prior Function            PT  Goals (current goals can now be found in the care plan section) Acute Rehab PT Goals Patient Stated Goal: to get back to walking PT Goal Formulation: With patient Time For Goal Achievement: 03/26/21 Potential to Achieve Goals: Good Progress towards PT goals: Progressing toward goals    Frequency    Min 3X/week      PT Plan Current plan remains appropriate    Co-evaluation              AM-PAC PT "6 Clicks" Mobility   Outcome Measure  Help needed turning from your back to your side while in a flat bed without using bedrails?: None Help needed moving from lying on your back to sitting on the side of a flat bed without using bedrails?: A Little Help needed moving to and from a bed to a chair (including a wheelchair)?: A Little Help needed standing up from a chair using your arms (e.g., wheelchair or bedside chair)?: A Little Help needed to walk in hospital room?: A Lot Help needed climbing 3-5 steps with a railing? : Total 6 Click Score: 16    End of Session Equipment Utilized During Treatment: Gait belt Activity Tolerance: Patient limited by pain Patient left: in bed;with call bell/phone within reach;with bed alarm set;with nursing/sitter in room;with family/visitor present Nurse Communication: Mobility status;Other (comment) (IV site bleeding, LUE pain) PT Visit Diagnosis: Unsteadiness on feet (R26.81);Muscle weakness (generalized) (M62.81)     Time: [id number] PT Time Calculation (min) (ACUTE ONLY): 25 min  Charges:  $Therapeutic Exercise: 8-22 mins $Therapeutic Activity: 8-22 mins                      Tori Kalani Sthilaire PT, DPT 03/24/21, 12:42 PM

## 2021-03-24 NOTE — TOC Progression Note (Signed)
Transition of Care [facility]) - Progression Note    Patient Details  Name: YANELIZ RADEBAUGH MRN: [id number] Date of Birth: Sep 16, 1965  Transition of Care [facility]) CM/SW Contact  Villa Herb, [place] Phone Number: 03/24/2021, 2:51 PM  Clinical Narrative:    CSW spoke with pts daughter about bed offers. Pts daughter would like to accept bed at [facility]. CSW spoke with Eunice Blase in admissions who states that she will start insurance auth for pt. TOC to follow.   Expected Discharge Plan: Skilled Nursing Facility Barriers to Discharge: Continued Medical Work up  Expected Discharge Plan and Services Expected Discharge Plan: Skilled Nursing Facility In-house Referral: Clinical Social Work   Post Acute Care Choice: Skilled Nursing Facility Living arrangements for the past 2 months: Apartment                                       Social Determinants of Health (SDOH) Interventions    Readmission Risk Interventions No flowsheet data found.

## 2021-03-24 NOTE — Progress Notes (Signed)
Patient is pleasant this evening. She only complains of being hungry and dinner was not good. No pain currently. Will continue to monitor during shift.

## 2021-03-24 NOTE — Progress Notes (Addendum)
Progress Note  Patient Name: Kristine Montgomery Date of Encounter: 03/24/2021  [facility] Cardiologist: Dorris Carnes, MD   Subjective   Breathing at baseline. No chest pain or palpitations. Does report pain along her ankles. Evaluated by PT yesterday with short-term rehab recommended.   Inpatient Medications    Scheduled Meds:  [START ON 04/06/2021] amiodarone  200 mg Oral Daily   amiodarone  400 mg Oral BID   apixaban  5 mg Oral BID   atorvastatin  80 mg Oral q1800   clopidogrel  75 mg Oral Daily   dapagliflozin propanediol  10 mg Oral Daily   metoprolol succinate  25 mg Oral Daily   sacubitril-valsartan  1 tablet Oral BID   Continuous Infusions:  PRN Meds: acetaminophen   Vital Signs    Vitals:   03/23/21 1255 03/23/21 1346 03/23/21 2029 03/24/21 0434  BP: (!) 113/52 120/63 135/65 (!) 122/53  Pulse: 67 66 69 (!) 57  Resp:   20 20  Temp:  99 F (37.2 C) 98.3 F (36.8 C) 98.2 F (36.8 C)  TempSrc:  Oral Oral   SpO2:  98% 98% 94%  Weight:      Height:        Intake/Output Summary (Last 24 hours) at 03/24/2021 0735 Last data filed at 03/23/2021 1824 Gross per 24 hour  Intake 720 ml  Output 400 ml  Net 320 ml   Last 3 Weights 03/23/2021 03/23/2021 03/08/2021  Weight (lbs) 236 lb 5.3 oz 238 lb 1.6 oz 238 lb  Weight (kg) 107.2 kg 108 kg 107.956 kg      Telemetry    Sinus bradycardia, HR in 40's to 60's.  - Personally Reviewed  ECG    NSR, HR 81 with no acute ST changes.  - Personally Reviewed  Physical Exam   GEN: Pleasant female appearing in no acute distress.   Neck: No JVD Cardiac: RRR, no murmurs, rubs, or gallops.  Respiratory: Clear to auscultation bilaterally. GI: Soft, nontender, non-distended  MS: No pitting edema; No deformity. Neuro:  Nonfocal  Psych: Normal affect   Labs    High Sensitivity Troponin:   Recent Labs  Lab 03/23/21 0049 03/23/21 0242 03/23/21 0618 03/23/21 0812  TROPONINIHS 12 23* 76* 100*     Chemistry Recent  Labs  Lab 03/23/21 0047 03/23/21 0049 03/24/21 0520  NA  --  134* 139  K  --  3.1* 3.6  CL  --  102 111  CO2  --  19* 24  GLUCOSE  --  134* 107*  BUN  --  15 13  CREATININE  --  0.83 0.62  CALCIUM  --  8.6* 8.4*  MG 2.0  --   --   PROT  --   --  6.9  ALBUMIN  --   --  2.4*  AST  --   --  10*  ALT  --   --  8  ALKPHOS  --   --  83  BILITOT  --   --  0.6  GFRNONAA  --  >60 >60  ANIONGAP  --  13 4*    Lipids No results for input(s): CHOL, TRIG, HDL, LABVLDL, LDLCALC, CHOLHDL in the last 168 hours.  Hematology Recent Labs  Lab 03/23/21 0049 03/24/21 0520  WBC 12.2* 7.3  RBC 3.89 3.36*  HGB 10.8* 9.1*  HCT 33.5* 29.3*  MCV 86.1 87.2  MCH 27.8 27.1  MCHC 32.2 31.1  RDW 15.2 15.2  PLT  403* 343   Thyroid No results for input(s): TSH, FREET4 in the last 168 hours.  BNP Recent Labs  Lab 03/23/21 0049  BNP 105.0*    DDimer No results for input(s): DDIMER in the last 168 hours.   Radiology    DG Chest Port 1 View  Result Date: 03/23/2021 CLINICAL DATA:  Chest pain. EXAM: PORTABLE CHEST 1 VIEW COMPARISON:  Portable chest 07/28/2020 FINDINGS: There is mild cardiomegaly with coronary artery stents on the left and no vascular congestion findings, with interval resolution of prior interstitial edema and vascular distension. The lungs are clear. No pleural effusion is seen. Thoracic spondylosis. Mild osteopenia. IMPRESSION: No evidence of acute chest disease.  Mild cardiomegaly. Electronically Signed   By: Almira Bar M.D.   On: 03/23/2021 01:20    Cardiac Studies   Limited Echo: 11/2020 IMPRESSIONS     1. Limited study.   2. Left ventricular ejection fraction, by estimation, is 55 to 60%. The  left ventricle has normal function. The left ventricle demonstrates  regional wall motion abnormalities (see scoring diagram/findings for  description).   3. Right ventricular systolic function is normal. The right ventricular  size is normal.   4. A small pericardial  effusion is present. The pericardial effusion is  circumferential.   5. The inferior vena cava is normal in size with greater than 50%  respiratory variability, suggesting right atrial pressure of 3 mmHg.   Patient Profile     55 y.o. 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   Assessment & Plan    1. Atrial Fibrillation with RVR - She developed new-onset palpitations the day of admission and was found to be in atrial fibrillation with RVR upon arrival. K+ was low on admission at 3.1 but has been replaced. Mg 2.0. Will check TSH.  - Initially on IV Cardizem but developed hypotension with this and was started on IV Amiodarone. Did convert to NSR on Amiodarone and she has been switched to PO Amiodarone with plans to remain on 400mg  BID for two weeks followed by 200mg  daily. Can hopefully use for the short-term given her relatively young age. Her HR has been in the 40's at times. Will reduce Toprol-XL from 25mg  daily to 12.5mg  daily. Would benefit from an outpatient sleep study given her body habitus, arrhythmia and nocturnal bradycardia.  - Her CHA2DS2-VASc Score is 5 and she has been started on Eliquis 5mg  BID for anticoagulation.   2. Elevated Troponin  - Hs Troponin values peaked at 100, felt to be secondary to demand ischemia in the setting of atrial fibrillation with RVR. No plans for further ischemic testing at this time.   3. CAD - She is s/p NSTEMI in 07/2020 with DES to prox-LAD and DES to prox-LCx and medical management recommended of occluded RCA. Hs Troponin values have been flat as outlined above, not consistent with ACS.  - Brilinta has been changed to Plavix given the need for anticoagulation and ASA has been discontinued. Continue  BB and statin therapy.   4. HFimpEF - EF previously 30% in 07/2020, improved to 55-60% by echo in 11/2020. She has been restarted on Entresto 24-26mg  BID and Farxiga 10mg  daily. Will reduce Toprol-XL to 12.5mg  daily as  outlined above.   5. HLD - Continue PTA Atorvastatin 80mg  daily.    Will arrange for outpatient follow-up in 2-3 weeks.   For questions or updates, please contact [place] Please consult [email or web address] for contact info under        Signed, Erma Heritage, PA-C  03/24/2021, 7:35 AM    Attending Note Patient seen and discussed with PA Ahmed Prima, I agree with her documentation. 55 yo female history of CAD with prior DES to prox LAD and DES to prox LCX in [id number], chronic systolic HF LVEF as low as 30% but recovered by most recent echo, HTN, hyperlipidemia presetned with afib with RVR. Hypotensive on IV dilatizem, received IV amio and converted to SR. Currently on toprol 12.5mg , amio 400mg  bid, eliquis 5mg  bid with plavix given PCI within the last year. Plan is for amio 400mg  id x 2 weeks, then 200mg  daily. At follow up would arrange EP evaluation to see if could convert from [facility] to alternative antiarrhythmic.   Regarding her history of heart failure with recovered EF she is on toprol 12.5, farxiga 10mg , entresto 24/26mg  bid. Aldactone held for soft bp's, restart today. Home toprol lowered due to some occasional bradycardia  Mild trop in settting of tachycarrhythmia consistent with demand ischemia, no plans for ishcemic testing   Ok for discharge from cardiac standpoint, we will signoff inpatient care and arrange outpatient f/u.  Carlyle Dolly MD

## 2021-03-24 NOTE — Progress Notes (Signed)
OT Cancellation Note  Patient Details Name: Kristine Montgomery MRN: [id number] DOB: [birth year]   Cancelled Treatment:    Reason Eval/Treat Not Completed: Pain limiting ability to participate. Doctor reported pt would need dilaudid prior to attempting therapy due to likely gout flair up. Will attempt to see pt later as time permits.   Charmain Diosdado OT, MOT   Danie Chandler 03/24/2021, 3:03 PM

## 2021-03-25 DIAGNOSIS — I480 Paroxysmal atrial fibrillation: Secondary | ICD-10-CM | POA: Diagnosis not present

## 2021-03-25 LAB — GLUCOSE, CAPILLARY
Glucose-Capillary: 136 mg/dL — ABNORMAL HIGH (ref 70–99)
Glucose-Capillary: 131 mg/dL — ABNORMAL HIGH (ref 70–99)
Glucose-Capillary: 153 mg/dL — ABNORMAL HIGH (ref 70–99)
Glucose-Capillary: 144 mg/dL — ABNORMAL HIGH (ref 70–99)

## 2021-03-25 MED ORDER — AMIODARONE HCL 400 MG PO TABS: 400.0000 mg | ORAL_TABLET | Freq: Two times a day (BID) | ORAL | 0 refills | Status: DC

## 2021-03-25 MED ORDER — AMIODARONE HCL 200 MG PO TABS
200.0000 mg | ORAL_TABLET | Freq: Every day | ORAL | 2 refills | Status: DC
Start: 2021-03-31 — End: 2021-03-27

## 2021-03-25 MED ORDER — APIXABAN 5 MG PO TABS: 5.0000 mg | ORAL_TABLET | Freq: Two times a day (BID) | ORAL | 3 refills | Status: DC

## 2021-03-25 MED ORDER — METOPROLOL SUCCINATE ER 25 MG PO TB24: 12.5000 mg | ORAL_TABLET | Freq: Every day | ORAL | 2 refills | Status: DC

## 2021-03-25 MED ORDER — CLOPIDOGREL BISULFATE 75 MG PO TABS: 75.0000 mg | ORAL_TABLET | Freq: Every day | ORAL | 0 refills | Status: DC

## 2021-03-25 MED ORDER — ATORVASTATIN CALCIUM 80 MG PO TABS: 80.0000 mg | ORAL_TABLET | Freq: Every day | ORAL | 1 refills | Status: DC

## 2021-03-25 MED ORDER — METHYLPREDNISOLONE 4 MG PO TBPK: ORAL_TABLET | ORAL | 0 refills | Status: DC

## 2021-03-25 MED ORDER — FAMOTIDINE 40 MG PO TABS: 40.0000 mg | ORAL_TABLET | Freq: Every evening | ORAL | 1 refills | Status: DC

## 2021-03-25 NOTE — TOC Progression Note (Signed)
Transition of Care [facility]) - Progression Note    Patient Details  Name: Kristine Montgomery MRN: [id number] Date of Birth: [birth year]  Transition of Care [facility]) CM/SW Contact  Karn Cassis, [place] Phone Number: 03/25/2021, 10:42 AM  Clinical Narrative:  [name] confirmed with Eunice Blase at [facility] that authorization was started yesterday. Auth still pending. TOC will continue to follow.      Expected Discharge Plan: Skilled Nursing Facility Barriers to Discharge: Continued Medical Work up  Expected Discharge Plan and Services Expected Discharge Plan: Skilled Nursing Facility In-house Referral: Clinical Social Work   Post Acute Care Choice: Skilled Nursing Facility Living arrangements for the past 2 months: Apartment                                       Social Determinants of Health (SDOH) Interventions    Readmission Risk Interventions No flowsheet data found.

## 2021-03-25 NOTE — Discharge Instructions (Signed)

## 2021-03-25 NOTE — Discharge Summary (Signed)
Physician Discharge Summary [facility]    Patient: Kristine Montgomery                   Admit date: 03/23/2021   DOB: 03/15/1966             Discharge date:03/25/2021/12:31 PM ZOX:[id number]                          PCP: Elfredia Nevins, MD  Disposition:    SNF  Recommendations for Outpatient Follow-up:   Follow up: With cardiologist within 1-2 weeks, continue currently recommended medication for cardiology including amiodarone 400 p.o. twice daily for total of 14 days, Denti-Foam 200 mg p.o. daily, Brilinta discontinued, initiated Eliquis and Plavix Continue aggressive PT OT, fall precautions, Follow with PCP within 2-3 weeks  Discharge Condition: Stable   Code Status:   Code Status: Full Code  Diet recommendation: Cardiac diet   Discharge Diagnoses:    Principal Problem:   Paroxysmal atrial fibrillation (HCC) Active Problems:   Acute combined systolic and diastolic CHF, NYHA class 4 (HCC)   Mixed hyperlipidemia   NSTEMI (non-ST elevated myocardial infarction) (HCC)   Ischemic cardiomyopathy   Coronary artery disease involving native coronary artery of native heart with unstable angina pectoris (HCC)   Morbid obesity (HCC)   Tobacco abuse   Hypokalemia   Leukocytosis   Thrombocytosis   Elevated troponin   Hyperglycemia   History of Present Illness/ Hospital Course [name] Summary:    Kristine Montgomery is a 55 y.o. female with medical history significant for NSTEMI, CAD status post stent placement, ischemic cardiomyopathy, combined systolic and diastolic CHF who presents to the emergency department due to palpitations which started few hours prior to arrival to the ED.  Work-up revealed new onset atrial fibrillation for which she was started on Cardizem drip, due to hypotension was switched to amiodarone drip then converted to sinus rhythm.  She was seen by cardiology and started on Eliquis for CVA prevention.  PT OT assessed and recommended SNF.  TOC assisting with  SNF placement.   Hospital course complicated by left elbow and left ankle edema and tenderness with slight touch.  No prior history of gout however concern for developing gout.  Uric acid ordered and pending, started on IV Solu-Medrol due to concern for acute gout flare.     Assessment/Plan:    New onset, newly diagnosed paroxysmal atrial fibrillation In A. fib with RVR on presentation, initially received Cardizem drip, became hypotensive then was switched to amiodarone drip then she converted to sinus rhythm. CHA2DS2-VASc of 5, she was started on Eliquis by cardiology on 03/23/2021 She is on oral Amiodarone for rhythm control (400 mg BID for total of 14 days, then 200 mg p.o. daily On Toprol-XL for rate control.... Due to bradycardia dosage reduced from 25 to 12.5 mg    Elevated troponin, suspect demand ischemia in the setting of A. fib with RVR Management per cardiology Continue Plavix, Eliquis, statin.   Ischemic cardiomyopathy with acute combined systolic and diastolic CHF Managed by cardiology She is currently on Toprol-XL 12.5 mg daily, Entresto 24-26 mg twice daily, Plavix 75 mg daily, Lipitor 80 mg daily, Amiodarone, and Eliquis. -Continue monitoring daily weight if > 5 lbs weight gain within 24 hours, notify cardiologist   Coronary artery disease status post PCI with stent placement on March 2022 Denies any anginal symptoms at the time of the visit Patient was on aspirin and  Brilinta due to addition to Eliquis for her paroxysmal A. fib aspirin and Brilinta was stopped and Plavix was added, continue Eliquis, Plavix, and high intensity statin.   Suspected gout flare, no prior history of gout Obtain uric acid Start IV Solu-Medrol 40 mg twice daily >>> switch to p.o. prednisone taper   Ambulatory dysfunction PT OT assessed and recommending SNF TOC assisting with DC planning   Prediabetes, will likely be exacerbated by IV Solu-Medrol Started on Farxiga by cardiology -Monitor  for hypoglycemia is being tapered down on steroids     Code Status: Full code   Family Communication: Daughter    Disposition Plan: Likely will discharge to SNF once bed is available, and insurance approval      Consultants: Cardiology, signed off on 03/24/2021.   Procedures: None   Antimicrobials: None      Discharge Instructions:   Discharge Instructions     Activity as tolerated - No restrictions   Complete by: As directed    Call MD for:  difficulty breathing, headache or visual disturbances   Complete by: As directed    Call MD for:  persistant dizziness or light-headedness   Complete by: As directed    Diet - low sodium heart healthy   Complete by: As directed    Discharge instructions   Complete by: As directed    Follow-up with PCP, cardiology within 1-2 wks ... Medication may need to be adjusted especially Eliquis, amiodarone, Plavix, BP meds. Continue aggressive PT OT, fall precautions   Increase activity slowly   Complete by: As directed         Medication List     STOP taking these medications    aspirin EC 81 MG tablet   spironolactone 25 MG tablet Commonly known as: ALDACTONE   ticagrelor 90 MG Tabs tablet Commonly known as: BRILINTA       TAKE these medications    amiodarone 400 MG tablet Commonly known as: PACERONE Take 1 tablet (400 mg total) by mouth 2 (two) times daily for 5 days.   amiodarone 200 MG tablet Commonly known as: PACERONE Take 1 tablet (200 mg total) by mouth daily. Start taking on: March 31, 2021   apixaban 5 MG Tabs tablet Commonly known as: ELIQUIS Take 1 tablet (5 mg total) by mouth 2 (two) times daily.   atorvastatin 80 MG tablet Commonly known as: LIPITOR Take 1 tablet (80 mg total) by mouth daily at 6 PM. What changed: See the new instructions.   clopidogrel 75 MG tablet Commonly known as: PLAVIX Take 1 tablet (75 mg total) by mouth daily. Start taking on: March 26, 2021   famotidine 40 MG  tablet Commonly known as: PEPCID Take 1 tablet (40 mg total) by mouth every evening.   Farxiga 10 MG Tabs tablet Generic drug: dapagliflozin propanediol TAKE 1 TABLET BY MOUTH ONCE A DAY. What changed: how much to take   furosemide 20 MG tablet Commonly known as: LASIX TAKE (1) TABLET BY MOUTH ONCE DAILY. TAKE AN ADDITIONAL 1 TABLET FOR 3 POUND WEIGHT GAIN OVERNIGHT OR 5 POUNDS IN A WEEK. What changed: See the new instructions.   methylPREDNISolone 4 MG Tbpk tablet Commonly known as: MEDROL DOSEPAK Medrol Dosepak take as instructed   metoprolol succinate 25 MG 24 hr tablet Commonly known as: TOPROL-XL Take 0.5 tablets (12.5 mg total) by mouth daily. Start taking on: March 26, 2021 What changed: how much to take   nitroGLYCERIN 0.4 MG SL tablet Commonly known  as: NITROSTAT PLACE 1 TABLET (0.4 MG TOTAL) UNDER THE TONGUE EVERY FIVE MINUTES X 3 DOSES AS NEEDED FOR CHEST PAIN. What changed:  how much to take when to take this Another medication with the same name was removed. Continue taking this medication, and follow the directions you see here.   sacubitril-valsartan 24-26 MG Commonly known as: ENTRESTO TAKE 1 TABLET BY MOUTH TWO TIMES DAILY.        Follow-up Information     Ellsworth Lennox, PA-C Follow up on 04/16/2021.   Specialties: Physician Assistant, Cardiology Why: Cardiology Hospital Follow-up on 04/16/2021 at 3:30 PM. Contact information: [address] [place] [address] [phone]                No Known Allergies   Quit smoking:  It is highly recommended that all people especially deals with diabetes to quit smoking or stay away from smoking, avoid secondhand smoking.   Vaccines:  Also highly recommended update your vaccines requirement, including SARS-CoV-2 , yearly  flu vaccine and pneumonia vaccine at least every 5 years.      Exercise: If you are able: 30 -60 minutes a day, 4 days a week, or 150 minutes a week.  The longer the  better.  Combine stretch, strength, and aerobic activities.  If you were told in the past that you have high risk for cardiovascular diseases, you may seek evaluation by your heart doctor prior to initiating moderate to intense exercise programs.    One other important lifestyle recommendation is to ensure adequate sleep - at least 6-7 hours of uninterrupted sleep at night.  Procedures /Studies:   DG Ankle Complete Left  Result Date: 03/08/2021 CLINICAL DATA:  Swelling, pain EXAM: LEFT ANKLE COMPLETE - 3+ VIEW COMPARISON:  None. FINDINGS: There is no evidence of fracture, dislocation, or joint effusion. Alignment is unremarkable. The ankle mortise is preserved. Bidirectional calcaneal enthesophytes. Mild degenerative changes in the midfoot. Moderate soft tissue swelling about the medial greater than lateral lower leg and ankle. IMPRESSION: No acute fracture or dislocation. Electronically Signed   By: Wiliam Ke M.D.   On: 03/08/2021 01:23   DG Chest Port 1 View  Result Date: 03/23/2021 CLINICAL DATA:  Chest pain. EXAM: PORTABLE CHEST 1 VIEW COMPARISON:  Portable chest 07/28/2020 FINDINGS: There is mild cardiomegaly with coronary artery stents on the left and no vascular congestion findings, with interval resolution of prior interstitial edema and vascular distension. The lungs are clear. No pleural effusion is seen. Thoracic spondylosis. Mild osteopenia. IMPRESSION: No evidence of acute chest disease.  Mild cardiomegaly. Electronically Signed   By: Almira Bar M.D.   On: 03/23/2021 01:20    Subjective:   Patient was seen and examined 03/25/2021, 12:31 PM Patient stable today. No acute distress.  No issues overnight Stable for discharge.  Discharge Exam:    Vitals:   03/24/21 0434 03/24/21 1322 03/24/21 2012 03/25/21 0345  BP: (!) 122/53 132/64 127/76 (!) 125/52  Pulse: (!) 57 (!) 54 65 (!) 58  Resp: 20 15 20 20   Temp: 98.2 F (36.8 C) 98.4 F (36.9 C) 97.8 F (36.6 C) 98.1 F  (36.7 C)  TempSrc:   Oral   SpO2: 94% 98% 96% 96%  Weight:      Height:        General: Pt lying comfortably in bed & appears in no obvious distress. Cardiovascular: S1 & S2 heard, RRR, S1/S2 +. No murmurs, rubs, gallops or clicks. No JVD or  pedal edema. Respiratory: Clear to auscultation without wheezing, rhonchi or crackles. No increased work of breathing. Abdominal:  Non-distended, non-tender & soft. No organomegaly or masses appreciated. Normal bowel sounds heard. CNS: Alert and oriented. No focal deficits. Extremities: no edema, no cyanosis      The results of significant diagnostics from this hospitalization (including imaging, microbiology, ancillary and laboratory) are listed below for reference.      Microbiology:   Recent Results (from the past 240 hour(s))  Resp Panel by RT-PCR (Flu A&B, Covid) Nasopharyngeal Swab     Status: None   Collection Time: 03/23/21 12:49 AM   Specimen: Nasopharyngeal Swab; Nasopharyngeal(NP) swabs in vial transport medium  Result Value Ref Range Status   SARS Coronavirus 2 by RT PCR NEGATIVE NEGATIVE Final    Comment: (NOTE) SARS-CoV-2 target nucleic acids are NOT DETECTED.  The SARS-CoV-2 RNA is generally detectable in upper respiratory specimens during the acute phase of infection. The lowest concentration of SARS-CoV-2 viral copies this assay can detect is 138 copies/mL. A negative result does not preclude SARS-Cov-2 infection and should not be used as the sole basis for treatment or other patient management decisions. A negative result may occur with  improper specimen collection/handling, submission of specimen other than nasopharyngeal swab, presence of viral mutation(s) within the areas targeted by this assay, and inadequate number of viral copies(<138 copies/mL). A negative result must be combined with clinical observations, patient history, and epidemiological information. The expected result is Negative.  Fact Sheet for  Patients:  BloggerCourse.com  Fact Sheet for Healthcare Providers:  SeriousBroker.it  This test is no t yet approved or cleared by the [place] FDA and  has been authorized for detection and/or diagnosis of SARS-CoV-2 by FDA under an Emergency Use Authorization (EUA). This EUA will remain  in effect (meaning this test can be used) for the duration of the COVID-19 declaration under Section 564(b)(1) of the Act, 21 U.S.C.section 360bbb-3(b)(1), unless the authorization is terminated  or revoked sooner.       Influenza A by PCR NEGATIVE NEGATIVE Final   Influenza B by PCR NEGATIVE NEGATIVE Final    Comment: (NOTE) The Xpert Xpress SARS-CoV-2/FLU/RSV plus assay is intended as an aid in the diagnosis of influenza from Nasopharyngeal swab specimens and should not be used as a sole basis for treatment. Nasal washings and aspirates are unacceptable for Xpert Xpress SARS-CoV-2/FLU/RSV testing.  Fact Sheet for Patients: BloggerCourse.com  Fact Sheet for Healthcare Providers: SeriousBroker.it  This test is not yet approved or cleared by the [place] FDA and has been authorized for detection and/or diagnosis of SARS-CoV-2 by FDA under an Emergency Use Authorization (EUA). This EUA will remain in effect (meaning this test can be used) for the duration of the COVID-19 declaration under Section 564(b)(1) of the Act, 21 U.S.C. section 360bbb-3(b)(1), unless the authorization is terminated or revoked.  Performed at [facility], [address], [place], [place] [address]      Labs:   CBC: Recent Labs  Lab 03/23/21 0049 03/24/21 0520  WBC 12.2* 7.3  NEUTROABS 7.9*  --   HGB 10.8* 9.1*  HCT 33.5* 29.3*  MCV 86.1 87.2  PLT 403* 343   Basic Metabolic Panel: Recent Labs  Lab 03/23/21 0047 03/23/21 0049 03/24/21 0520  NA  --  134* 139  K  --  3.1* 3.6  CL  --  102 111   CO2  --  19* 24  GLUCOSE  --  134* 107*  BUN  --  15 13  CREATININE  --  0.83 0.62  CALCIUM  --  8.6* 8.4*  MG 2.0  --   --   PHOS 3.1  --   --    Liver Function Tests: Recent Labs  Lab 03/24/21 0520  AST 10*  ALT 8  ALKPHOS 83  BILITOT 0.6  PROT 6.9  ALBUMIN 2.4*   BNP (last 3 results) Recent Labs    07/28/20 1211 03/23/21 0049  BNP 200.0* 105.0*   Cardiac Enzymes: No results for input(s): CKTOTAL, CKMB, CKMBINDEX, TROPONINI in the last 168 hours. CBG: Recent Labs  Lab 03/24/21 1645 03/24/21 2014 03/25/21 0742 03/25/21 1159  GLUCAP 125* 144* 136* 131*   Hgb A1c Recent Labs    03/24/21 1336  HGBA1C 5.3   Lipid Profile No results for input(s): CHOL, HDL, LDLCALC, TRIG, CHOLHDL, LDLDIRECT in the last 72 hours. Thyroid function studies Recent Labs    03/24/21 0520  TSH 3.457   Anemia work up No results for input(s): VITAMINB12, FOLATE, FERRITIN, TIBC, IRON, RETICCTPCT in the last 72 hours. Urinalysis    Component Value Date/Time   COLORURINE YELLOW 08/05/2016 1747   APPEARANCEUR Clear 02/27/2020 1530   LABSPEC 1.021 08/05/2016 1747   PHURINE 5.0 08/05/2016 1747   GLUCOSEU Negative 02/27/2020 1530   HGBUR NEGATIVE 08/05/2016 1747   BILIRUBINUR Negative 02/27/2020 1530   KETONESUR NEGATIVE 08/05/2016 1747   PROTEINUR Trace 02/27/2020 1530   PROTEINUR NEGATIVE 08/05/2016 1747   NITRITE Negative 02/27/2020 1530   NITRITE NEGATIVE 08/05/2016 1747   LEUKOCYTESUR 1+ (A) 02/27/2020 1530         Time coordinating discharge: Over 45 minutes  SIGNED: Kendell Bane, MD, FACP, FHM. [facility],  Please use [email or web address] to Page If 7PM-7AM, please contact night-coverage [email or web address],  03/25/2021, 12:31 PM

## 2021-03-25 NOTE — Evaluation (Signed)
Occupational Therapy Evaluation Patient Details Name: Kristine Montgomery MRN: [id number] DOB: [birth year] Today's Date: 03/25/2021   History of Present Illness Kristine Montgomery is a 55 y.o. female who presents to the due to palpitations. Pt admitted with afib wtih RVR. PMH: NSTEMI, CAD s/p stent placement, ischemic cardiomyopathy, combined systolic and diastolic CHF   Clinical Impression   Pt agreeable to OT evaluation. Pt reports PRN assistance at home from family and friends. Pt demonstrates L hand A/ROM limitations due to pain, but pt able to functionally use L hand on RW during sit to stand and functional ambulation forward and backward in room with min G to min A. Pt initially stood with cane but was unsteady and agreeable to attempting ambulation with RW with noted improvement in stability. Pt does not don socks at baseline and required total assist today. Noted to have mild R LE limp when transferring with RW. Pt also required assist from NT and family member to don undergarment this date. Pt left in bed with family and NT present. Pt will benefit from continued OT in the hospital and recommended venue below to increase strength, balance, and endurance for safe ADL's.        Recommendations for follow up therapy are one component of a multi-disciplinary discharge planning process, led by the attending physician.  Recommendations may be updated based on patient status, additional functional criteria and insurance authorization.   Follow Up Recommendations  Skilled nursing-short term rehab (<3 hours/day)    Assistance Recommended at Discharge PRN  Functional Status Assessment  Patient has had a recent decline in their functional status and demonstrates the ability to make significant improvements in function in a reasonable and predictable amount of time.  Equipment Recommendations  None recommended by OT    Recommendations for Other Services       Precautions / Restrictions  Precautions Precautions: Fall Restrictions Weight Bearing Restrictions: No      Mobility Bed Mobility Overal bed mobility: Needs Assistance Bed Mobility: Supine to Sit;Sit to Supine     Supine to sit: Modified independent (Device/Increase time) Sit to supine: Modified independent (Device/Increase time)   General bed mobility comments: Mild labored movement but no assist needed.    Transfers Overall transfer level: Needs assistance Equipment used: Rolling walker (2 wheels) Transfers: Sit to/from Stand;Bed to chair/wheelchair/BSC Sit to Stand: Min guard;Min assist           General transfer comment: Pt able to sit to stand followed by ambulation foward and backward prior to return to sitting. Pt initially used cane but was unsteady. Improved balance with use of RW.      Balance Overall balance assessment: Needs assistance Sitting-balance support: No upper extremity supported;Feet supported Sitting balance-Leahy Scale: Good Sitting balance - Comments: seated EOB   Standing balance support: During functional activity;Reliant on assistive device for balance;Bilateral upper extremity supported Standing balance-Leahy Scale: Poor Standing balance comment: poor to fair with RW; attempted standing with cane but pt unstable.                           ADL either performed or assessed with clinical judgement   ADL Overall ADL's : Needs assistance/impaired                       Lower Body Dressing Details (indicate cue type and reason): Pt assisted totally to don socks but reports she does not don socks at  baseline but uses slide on shoes. Toilet Transfer: Minimal assistance;Min guard;Ambulation;Rolling walker (2 wheels) Toilet Transfer Details (indicate cue type and reason): Partially simulated via pt sit to stand and ambulation in room prior to return to bed with RW.         Functional mobility during ADLs: Min guard;Minimal assistance;Rolling walker (2  wheels)       Vision Baseline Vision/History: 0 No visual deficits Ability to See in Adequate Light: 0 Adequate Patient Visual Report: No change from baseline Vision Assessment?: No apparent visual deficits                Pertinent Vitals/Pain Pain Assessment: 0-10 Pain Score: 8  Pain Location: L hand, foot, and bilateral legs. Pain Descriptors / Indicators: Throbbing Pain Intervention(s): Limited activity within patient's tolerance;Monitored during session;Repositioned     Hand Dominance Right   Extremity/Trunk Assessment Upper Extremity Assessment Upper Extremity Assessment: RUE deficits/detail;LUE deficits/detail RUE Deficits / Details: Generalized weakness RUE Coordination: WNL LUE Deficits / Details: Pt reports gout limting movement in L hand. Pt limited in compsit digit flexion >50%. Pt able to extended digits with 25% limitaion. Minimal active wrist flexion and extension. Mild lmitaions in P/ROM due to pain. Good A/ROM of supination and pronation. LUE Coordination: decreased fine motor   Lower Extremity Assessment Lower Extremity Assessment: Defer to PT evaluation   Cervical / Trunk Assessment Cervical / Trunk Assessment: Normal   Communication Communication Communication: No difficulties   Cognition Arousal/Alertness: Awake/alert Behavior During Therapy: WFL for tasks assessed/performed;Anxious Overall Cognitive Status: Within Functional Limits for tasks assessed                                                        Home Living Family/patient expects to be discharged to:: Private residence Living Arrangements: Alone Available Help at Discharge: Family;Available PRN/intermittently Type of Home: Apartment Home Access: Level entry     Home Layout: One level     Bathroom Shower/Tub: Chief Strategy Officer: Standard (Uses BSC over toilet)     Home Equipment: Rolling Walker (2 wheels);Cane - single point;Wheelchair -  manual;Cane - quad;BSC/3in1   Additional Comments: Pt reports friends and family available PRN.      Prior Functioning/Environment Prior Level of Function : Needs assist             Mobility Comments: limited to houshold ADLs Comments: daughter assists in housekeeping and shopping        OT Problem List: Decreased strength;Decreased range of motion;Decreased activity tolerance;Impaired balance (sitting and/or standing);Pain      OT Treatment/Interventions: Self-care/ADL training;Therapeutic exercise;Energy conservation;Manual therapy;Therapeutic activities;Splinting;Patient/family education;Balance training    OT Goals(Current goals can be found in the care plan section) Acute Rehab OT Goals Patient Stated Goal: pt open to rehab prior to return home OT Goal Formulation: With patient Time For Goal Achievement: 04/08/21 Potential to Achieve Goals: Good  OT Frequency: Min 2X/week    AM-PAC OT "6 Clicks" Daily Activity     Outcome Measure Help from another person eating meals?: A Little Help from another person taking care of personal grooming?: A Little Help from another person toileting, which includes using toliet, bedpan, or urinal?: A Lot Help from another person bathing (including washing, rinsing, drying)?: A Lot Help from another person to put on and taking off regular  upper body clothing?: A Little Help from another person to put on and taking off regular lower body clothing?: A Lot 6 Click Score: 15   End of Session Equipment Utilized During Treatment: Rolling walker (2 wheels) (cane)  Activity Tolerance: Patient tolerated treatment well Patient left: in bed;with call bell/phone within reach;with nursing/sitter in room;with family/visitor present  OT Visit Diagnosis: Unsteadiness on feet (R26.81);Other abnormalities of gait and mobility (R26.89);Muscle weakness (generalized) (M62.81);Pain Pain - Right/Left: Left Pain - part of body: Hand                Time:  7001-7494 OT Time Calculation (min): 16 min Charges:  OT General Charges $OT Visit: 1 Visit OT Evaluation $OT Eval Low Complexity: 1 Low  Cambry Spampinato OT, MOT  Danie Chandler 03/25/2021, 9:55 AM

## 2021-03-25 NOTE — Plan of Care (Signed)
  Problem: Acute Rehab OT Goals (only OT should resolve) Goal: Pt. Will Perform Grooming Flowsheets (Taken 03/25/2021 0959) Pt Will Perform Grooming:  standing  with set-up Goal: Pt. Will Perform Upper Body Dressing Flowsheets (Taken 03/25/2021 0959) Pt Will Perform Upper Body Dressing:  with modified independence  with adaptive equipment  sitting Goal: Pt. Will Perform Lower Body Dressing Flowsheets (Taken 03/25/2021 0959) Pt Will Perform Lower Body Dressing:  with supervision  sit to/from stand  with adaptive equipment Goal: Pt. Will Transfer To Toilet Flowsheets (Taken 03/25/2021 0959) Pt Will Transfer to Toilet:  with modified independence  stand pivot transfer  ambulating Goal: Pt/Caregiver Will Perform Home Exercise Program Flowsheets (Taken 03/25/2021 0959) Pt/caregiver will Perform Home Exercise Program:  Increased ROM  Increased strength  Left upper extremity  With minimal assist  Jordis Repetto OT, MOT

## 2021-03-26 DIAGNOSIS — I480 Paroxysmal atrial fibrillation: Secondary | ICD-10-CM | POA: Diagnosis not present

## 2021-03-26 LAB — GLUCOSE, CAPILLARY
Glucose-Capillary: 139 mg/dL — ABNORMAL HIGH (ref 70–99)
Glucose-Capillary: 116 mg/dL — ABNORMAL HIGH (ref 70–99)
Glucose-Capillary: 112 mg/dL — ABNORMAL HIGH (ref 70–99)
Glucose-Capillary: 145 mg/dL — ABNORMAL HIGH (ref 70–99)

## 2021-03-26 MED ORDER — LORAZEPAM 2 MG/ML IJ SOLN: 1.0000 mg | Freq: Once | INTRAMUSCULAR | Status: DC

## 2021-03-26 NOTE — Progress Notes (Signed)
Physician Discharge Summary [facility]    Patient: Kristine Montgomery                   Admit date: 03/23/2021   DOB: 08-01-1965             Discharge date:03/26/2021/12:36 PM ZOX:[id number]                          PCP: Elfredia Nevins, MD  Disposition:    SNF  Recommendations for Outpatient Follow-up:   Follow up: With cardiologist within 1-2 weeks, continue currently recommended medication for cardiology including amiodarone 400 p.o. twice daily for total of 14 days, Denti-Foam 200 mg p.o. daily, Brilinta discontinued, initiated Eliquis and Plavix Continue aggressive PT OT, fall precautions, Follow with PCP within 2-3 weeks  Discharge Condition: Stable   Code Status:   Code Status: Full Code  Diet recommendation: Cardiac diet   Patient was seen and examined, stable this morning, ready for discharge to SNF, pending insurance approval..   Discharge Diagnoses:    Principal Problem:   Paroxysmal atrial fibrillation (HCC) Active Problems:   Acute combined systolic and diastolic CHF, NYHA class 4 (HCC)   Mixed hyperlipidemia   NSTEMI (non-ST elevated myocardial infarction) (HCC)   Ischemic cardiomyopathy   Coronary artery disease involving native coronary artery of native heart with unstable angina pectoris (HCC)   Morbid obesity (HCC)   Tobacco abuse   Hypokalemia   Leukocytosis   Thrombocytosis   Elevated troponin   Hyperglycemia   History of Present Illness/ Hospital Course [name] Summary:    Kristine Montgomery is a 55 y.o. female with medical history significant for NSTEMI, CAD status post stent placement, ischemic cardiomyopathy, combined systolic and diastolic CHF who presents to the emergency department due to palpitations which started few hours prior to arrival to the ED.  Work-up revealed new onset atrial fibrillation for which she was started on Cardizem drip, due to hypotension was switched to amiodarone drip then converted to sinus rhythm.  She was seen  by cardiology and started on Eliquis for CVA prevention.  PT OT assessed and recommended SNF.  TOC assisting with SNF placement.   Hospital course complicated by left elbow and left ankle edema and tenderness with slight touch.  No prior history of gout however concern for developing gout.  Uric acid ordered and pending, started on IV Solu-Medrol due to concern for acute gout flare.     Assessment/Plan:    New onset, newly diagnosed paroxysmal atrial fibrillation In A. fib with RVR on presentation, initially received Cardizem drip, became hypotensive then was switched to amiodarone drip then she converted to sinus rhythm. CHA2DS2-VASc of 5, she was started on Eliquis by cardiology on 03/23/2021 She is on oral Amiodarone for rhythm control (400 mg BID for total of 14 days, then 200 mg p.o. daily On Toprol-XL for rate control.... Due to bradycardia dosage reduced from 25 to 12.5 mg    Elevated troponin, suspect demand ischemia in the setting of A. fib with RVR Management per cardiology Continue Plavix, Eliquis, statin.   Ischemic cardiomyopathy with acute combined systolic and diastolic CHF Managed by cardiology She is currently on Toprol-XL 12.5 mg daily, Entresto 24-26 mg twice daily, Plavix 75 mg daily, Lipitor 80 mg daily, Amiodarone, and Eliquis. -Continue monitoring daily weight if > 5 lbs weight gain within 24 hours, notify cardiologist   Coronary artery disease status post PCI with stent placement  on March 2022 Denies any anginal symptoms at the time of the visit Patient was on aspirin and Brilinta due to addition to Eliquis for her paroxysmal A. fib aspirin and Brilinta was stopped and Plavix was added, continue Eliquis, Plavix, and high intensity statin.   Suspected gout flare, no prior history of gout Obtain uric acid Start IV Solu-Medrol 40 mg twice daily >>> switch to p.o. prednisone taper   Ambulatory dysfunction PT OT assessed and recommending SNF TOC assisting with DC  planning   Prediabetes, will likely be exacerbated by IV Solu-Medrol Started on Farxiga by cardiology -Monitor for hypoglycemia is being tapered down on steroids     Code Status: Full code   Family Communication: Daughter    Disposition Plan: Likely will discharge to SNF once bed is available, and insurance approval      Consultants: Cardiology, signed off on 03/24/2021.   Procedures: None   Antimicrobials: None      Discharge Instructions:   Discharge Instructions     Activity as tolerated - No restrictions   Complete by: As directed    Call MD for:  difficulty breathing, headache or visual disturbances   Complete by: As directed    Call MD for:  persistant dizziness or light-headedness   Complete by: As directed    Diet - low sodium heart healthy   Complete by: As directed    Discharge instructions   Complete by: As directed    Follow-up with PCP, cardiology within 1-2 wks ... Medication may need to be adjusted especially Eliquis, amiodarone, Plavix, BP meds. Continue aggressive PT OT, fall precautions   Increase activity slowly   Complete by: As directed         Medication List     STOP taking these medications    aspirin EC 81 MG tablet   spironolactone 25 MG tablet Commonly known as: ALDACTONE   ticagrelor 90 MG Tabs tablet Commonly known as: BRILINTA       TAKE these medications    amiodarone 400 MG tablet Commonly known as: PACERONE Take 1 tablet (400 mg total) by mouth 2 (two) times daily for 5 days.   amiodarone 200 MG tablet Commonly known as: PACERONE Take 1 tablet (200 mg total) by mouth daily. Start taking on: March 31, 2021   apixaban 5 MG Tabs tablet Commonly known as: ELIQUIS Take 1 tablet (5 mg total) by mouth 2 (two) times daily.   atorvastatin 80 MG tablet Commonly known as: LIPITOR Take 1 tablet (80 mg total) by mouth daily at 6 PM. What changed: See the new instructions.   clopidogrel 75 MG tablet Commonly  known as: PLAVIX Take 1 tablet (75 mg total) by mouth daily.   famotidine 40 MG tablet Commonly known as: PEPCID Take 1 tablet (40 mg total) by mouth every evening.   Farxiga 10 MG Tabs tablet Generic drug: dapagliflozin propanediol TAKE 1 TABLET BY MOUTH ONCE A DAY. What changed: how much to take   furosemide 20 MG tablet Commonly known as: LASIX TAKE (1) TABLET BY MOUTH ONCE DAILY. TAKE AN ADDITIONAL 1 TABLET FOR 3 POUND WEIGHT GAIN OVERNIGHT OR 5 POUNDS IN A WEEK. What changed: See the new instructions.   methylPREDNISolone 4 MG Tbpk tablet Commonly known as: MEDROL DOSEPAK Medrol Dosepak take as instructed   metoprolol succinate 25 MG 24 hr tablet Commonly known as: TOPROL-XL Take 0.5 tablets (12.5 mg total) by mouth daily. What changed: how much to take   nitroGLYCERIN  0.4 MG SL tablet Commonly known as: NITROSTAT PLACE 1 TABLET (0.4 MG TOTAL) UNDER THE TONGUE EVERY FIVE MINUTES X 3 DOSES AS NEEDED FOR CHEST PAIN. What changed:  how much to take when to take this Another medication with the same name was removed. Continue taking this medication, and follow the directions you see here.   sacubitril-valsartan 24-26 MG Commonly known as: ENTRESTO TAKE 1 TABLET BY MOUTH TWO TIMES DAILY.        Follow-up Information     Ellsworth Lennox, PA-C Follow up on 04/16/2021.   Specialties: Physician Assistant, Cardiology Why: Cardiology Hospital Follow-up on 04/16/2021 at 3:30 PM. Contact information: [address] [place] [address] [phone]                No Known Allergies   Quit smoking:  It is highly recommended that all people especially deals with diabetes to quit smoking or stay away from smoking, avoid secondhand smoking.   Vaccines:  Also highly recommended update your vaccines requirement, including SARS-CoV-2 , yearly  flu vaccine and pneumonia vaccine at least every 5 years.      Exercise: If you are able: 30 -60 minutes a day, 4 days a  week, or 150 minutes a week.  The longer the better.  Combine stretch, strength, and aerobic activities.  If you were told in the past that you have high risk for cardiovascular diseases, you may seek evaluation by your heart doctor prior to initiating moderate to intense exercise programs.    One other important lifestyle recommendation is to ensure adequate sleep - at least 6-7 hours of uninterrupted sleep at night.  Procedures /Studies:   DG Ankle Complete Left  Result Date: 03/08/2021 CLINICAL DATA:  Swelling, pain EXAM: LEFT ANKLE COMPLETE - 3+ VIEW COMPARISON:  None. FINDINGS: There is no evidence of fracture, dislocation, or joint effusion. Alignment is unremarkable. The ankle mortise is preserved. Bidirectional calcaneal enthesophytes. Mild degenerative changes in the midfoot. Moderate soft tissue swelling about the medial greater than lateral lower leg and ankle. IMPRESSION: No acute fracture or dislocation. Electronically Signed   By: Wiliam Ke M.D.   On: 03/08/2021 01:23   DG Chest Port 1 View  Result Date: 03/23/2021 CLINICAL DATA:  Chest pain. EXAM: PORTABLE CHEST 1 VIEW COMPARISON:  Portable chest 07/28/2020 FINDINGS: There is mild cardiomegaly with coronary artery stents on the left and no vascular congestion findings, with interval resolution of prior interstitial edema and vascular distension. The lungs are clear. No pleural effusion is seen. Thoracic spondylosis. Mild osteopenia. IMPRESSION: No evidence of acute chest disease.  Mild cardiomegaly. Electronically Signed   By: Almira Bar M.D.   On: 03/23/2021 01:20    Subjective:   Patient was seen and examined 03/26/2021, 12:36 PM Patient stable today. No acute distress.  No issues overnight Stable for discharge.  Discharge Exam:    Vitals:   03/25/21 0345 03/25/21 1501 03/25/21 2131 03/26/21 0446  BP: (!) 125/52 (!) 127/55 (!) 142/67 (!) 142/69  Pulse: (!) 58 60 (!) 56 (!) 53  Resp: 20 17 18 18   Temp: 98.1 F  (36.7 C) 97.9 F (36.6 C) 97.7 F (36.5 C) 98 F (36.7 C)  TempSrc:  Oral Oral   SpO2: 96% 96% 96% 96%  Weight:      Height:        General: Pt lying comfortably in bed & appears in no obvious distress. Cardiovascular: S1 & S2 heard, RRR, S1/S2 +. No  murmurs, rubs, gallops or clicks. No JVD or pedal edema. Respiratory: Clear to auscultation without wheezing, rhonchi or crackles. No increased work of breathing. Abdominal:  Non-distended, non-tender & soft. No organomegaly or masses appreciated. Normal bowel sounds heard. CNS: Alert and oriented. No focal deficits. Extremities: no edema, no cyanosis      The results of significant diagnostics from this hospitalization (including imaging, microbiology, ancillary and laboratory) are listed below for reference.      Microbiology:   Recent Results (from the past 240 hour(s))  Resp Panel by RT-PCR (Flu A&B, Covid) Nasopharyngeal Swab     Status: None   Collection Time: 03/23/21 12:49 AM   Specimen: Nasopharyngeal Swab; Nasopharyngeal(NP) swabs in vial transport medium  Result Value Ref Range Status   SARS Coronavirus 2 by RT PCR NEGATIVE NEGATIVE Final    Comment: (NOTE) SARS-CoV-2 target nucleic acids are NOT DETECTED.  The SARS-CoV-2 RNA is generally detectable in upper respiratory specimens during the acute phase of infection. The lowest concentration of SARS-CoV-2 viral copies this assay can detect is 138 copies/mL. A negative result does not preclude SARS-Cov-2 infection and should not be used as the sole basis for treatment or other patient management decisions. A negative result may occur with  improper specimen collection/handling, submission of specimen other than nasopharyngeal swab, presence of viral mutation(s) within the areas targeted by this assay, and inadequate number of viral copies(<138 copies/mL). A negative result must be combined with clinical observations, patient history, and  epidemiological information. The expected result is Negative.  Fact Sheet for Patients:  BloggerCourse.com  Fact Sheet for Healthcare Providers:  SeriousBroker.it  This test is no t yet approved or cleared by the [place] FDA and  has been authorized for detection and/or diagnosis of SARS-CoV-2 by FDA under an Emergency Use Authorization (EUA). This EUA will remain  in effect (meaning this test can be used) for the duration of the COVID-19 declaration under Section 564(b)(1) of the Act, 21 U.S.C.section 360bbb-3(b)(1), unless the authorization is terminated  or revoked sooner.       Influenza A by PCR NEGATIVE NEGATIVE Final   Influenza B by PCR NEGATIVE NEGATIVE Final    Comment: (NOTE) The Xpert Xpress SARS-CoV-2/FLU/RSV plus assay is intended as an aid in the diagnosis of influenza from Nasopharyngeal swab specimens and should not be used as a sole basis for treatment. Nasal washings and aspirates are unacceptable for Xpert Xpress SARS-CoV-2/FLU/RSV testing.  Fact Sheet for Patients: BloggerCourse.com  Fact Sheet for Healthcare Providers: SeriousBroker.it  This test is not yet approved or cleared by the [place] FDA and has been authorized for detection and/or diagnosis of SARS-CoV-2 by FDA under an Emergency Use Authorization (EUA). This EUA will remain in effect (meaning this test can be used) for the duration of the COVID-19 declaration under Section 564(b)(1) of the Act, 21 U.S.C. section 360bbb-3(b)(1), unless the authorization is terminated or revoked.  Performed at [facility], [address] [place], [place] [address]      Labs:   CBC: Recent Labs  Lab 03/23/21 0049 03/24/21 0520  WBC 12.2* 7.3  NEUTROABS 7.9*  --   HGB 10.8* 9.1*  HCT 33.5* 29.3*  MCV 86.1 87.2  PLT 403* 343   Basic Metabolic Panel: Recent Labs  Lab 03/23/21 0047  03/23/21 0049 03/24/21 0520  NA  --  134* 139  K  --  3.1* 3.6  CL  --  102 111  CO2  --  19* 24  GLUCOSE  --  134* 107*  BUN  --  15 13  CREATININE  --  0.83 0.62  CALCIUM  --  8.6* 8.4*  MG 2.0  --   --   PHOS 3.1  --   --    Liver Function Tests: Recent Labs  Lab 03/24/21 0520  AST 10*  ALT 8  ALKPHOS 83  BILITOT 0.6  PROT 6.9  ALBUMIN 2.4*   BNP (last 3 results) Recent Labs    07/28/20 1211 03/23/21 0049  BNP 200.0* 105.0*   Cardiac Enzymes: No results for input(s): CKTOTAL, CKMB, CKMBINDEX, TROPONINI in the last 168 hours. CBG: Recent Labs  Lab 03/25/21 1159 03/25/21 1710 03/25/21 2109 03/26/21 0755 03/26/21 1221  GLUCAP 131* 153* 144* 139* 116*   Hgb A1c Recent Labs    03/24/21 1336  HGBA1C 5.3   Lipid Profile No results for input(s): CHOL, HDL, LDLCALC, TRIG, CHOLHDL, LDLDIRECT in the last 72 hours. Thyroid function studies Recent Labs    03/24/21 0520  TSH 3.457   Anemia work up No results for input(s): VITAMINB12, FOLATE, FERRITIN, TIBC, IRON, RETICCTPCT in the last 72 hours. Urinalysis    Component Value Date/Time   COLORURINE YELLOW 08/05/2016 1747   APPEARANCEUR Clear 02/27/2020 1530   LABSPEC 1.021 08/05/2016 1747   PHURINE 5.0 08/05/2016 1747   GLUCOSEU Negative 02/27/2020 1530   HGBUR NEGATIVE 08/05/2016 1747   BILIRUBINUR Negative 02/27/2020 1530   KETONESUR NEGATIVE 08/05/2016 1747   PROTEINUR Trace 02/27/2020 1530   PROTEINUR NEGATIVE 08/05/2016 1747   NITRITE Negative 02/27/2020 1530   NITRITE NEGATIVE 08/05/2016 1747   LEUKOCYTESUR 1+ (A) 02/27/2020 1530         Time coordinating discharge: Over 45 minutes  SIGNED: Kendell Bane, MD, FACP, FHM. [facility],  Please use [email or web address] to Page If 7PM-7AM, please contact night-coverage [email or web address],  03/26/2021, 12:36 PM

## 2021-03-26 NOTE — Discharge Summary (Signed)
Physician Discharge Summary [facility]    Patient: Kristine Montgomery                   Admit date: 03/23/2021   DOB: 01/23/1966             Discharge date:03/26/2021/12:34 PM ZOX:[id number]                          PCP: Elfredia Nevins, MD  Disposition:    SNF  Recommendations for Outpatient Follow-up:   Follow up: With cardiologist within 1-2 weeks, continue currently recommended medication for cardiology including amiodarone 400 p.o. twice daily for total of 14 days, Denti-Foam 200 mg p.o. daily, Brilinta discontinued, initiated Eliquis and Plavix Continue aggressive PT OT, fall precautions, Follow with PCP within 2-3 weeks  Discharge Condition: Stable   Code Status:   Code Status: Full Code  Diet recommendation: Cardiac diet   Patient was seen and examined, stable this morning, ready for discharge to SNF, pending insurance approval..   Discharge Diagnoses:    Principal Problem:   Paroxysmal atrial fibrillation (HCC) Active Problems:   Acute combined systolic and diastolic CHF, NYHA class 4 (HCC)   Mixed hyperlipidemia   NSTEMI (non-ST elevated myocardial infarction) (HCC)   Ischemic cardiomyopathy   Coronary artery disease involving native coronary artery of native heart with unstable angina pectoris (HCC)   Morbid obesity (HCC)   Tobacco abuse   Hypokalemia   Leukocytosis   Thrombocytosis   Elevated troponin   Hyperglycemia   History of Present Illness/ Hospital Course Kristine Montgomery Summary:    Kristine Montgomery is a 55 y.o. female with medical history significant for NSTEMI, CAD status post stent placement, ischemic cardiomyopathy, combined systolic and diastolic CHF who presents to the emergency department due to palpitations which started few hours prior to arrival to the ED.  Work-up revealed new onset atrial fibrillation for which she was started on Cardizem drip, due to hypotension was switched to amiodarone drip then converted to sinus rhythm.  She was seen  by cardiology and started on Eliquis for CVA prevention.  PT OT assessed and recommended SNF.  TOC assisting with SNF placement.   Hospital course complicated by left elbow and left ankle edema and tenderness with slight touch.  No prior history of gout however concern for developing gout.  Uric acid ordered and pending, started on IV Solu-Medrol due to concern for acute gout flare.     Assessment/Plan:    New onset, newly diagnosed paroxysmal atrial fibrillation In A. fib with RVR on presentation, initially received Cardizem drip, became hypotensive then was switched to amiodarone drip then she converted to sinus rhythm. CHA2DS2-VASc of 5, she was started on Eliquis by cardiology on 03/23/2021 She is on oral Amiodarone for rhythm control (400 mg BID for total of 14 days, then 200 mg p.o. daily On Toprol-XL for rate control.... Due to bradycardia dosage reduced from 25 to 12.5 mg    Elevated troponin, suspect demand ischemia in the setting of A. fib with RVR Management per cardiology Continue Plavix, Eliquis, statin.   Ischemic cardiomyopathy with acute combined systolic and diastolic CHF Managed by cardiology She is currently on Toprol-XL 12.5 mg daily, Entresto 24-26 mg twice daily, Plavix 75 mg daily, Lipitor 80 mg daily, Amiodarone, and Eliquis. -Continue monitoring daily weight if > 5 lbs weight gain within 24 hours, notify cardiologist   Coronary artery disease status post PCI with stent placement  on March 2022 Denies any anginal symptoms at the time of the visit Patient was on aspirin and Brilinta due to addition to Eliquis for her paroxysmal A. fib aspirin and Brilinta was stopped and Plavix was added, continue Eliquis, Plavix, and high intensity statin.   Suspected gout flare, no prior history of gout Obtain uric acid Start IV Solu-Medrol 40 mg twice daily >>> switch to p.o. prednisone taper   Ambulatory dysfunction PT OT assessed and recommending SNF TOC assisting with DC  planning   Prediabetes, will likely be exacerbated by IV Solu-Medrol Started on Farxiga by cardiology -Monitor for hypoglycemia is being tapered down on steroids     Code Status: Full code   Family Communication: Daughter    Disposition Plan: Likely will discharge to SNF once bed is available, and insurance approval      Consultants: Cardiology, signed off on 03/24/2021.   Procedures: None   Antimicrobials: None      Discharge Instructions:   Discharge Instructions     Activity as tolerated - No restrictions   Complete by: As directed    Call MD for:  difficulty breathing, headache or visual disturbances   Complete by: As directed    Call MD for:  persistant dizziness or light-headedness   Complete by: As directed    Diet - low sodium heart healthy   Complete by: As directed    Discharge instructions   Complete by: As directed    Follow-up with PCP, cardiology within 1-2 wks ... Medication may need to be adjusted especially Eliquis, amiodarone, Plavix, BP meds. Continue aggressive PT OT, fall precautions   Increase activity slowly   Complete by: As directed         Medication List     STOP taking these medications    aspirin EC 81 MG tablet   spironolactone 25 MG tablet Commonly known as: ALDACTONE   ticagrelor 90 MG Tabs tablet Commonly known as: BRILINTA       TAKE these medications    amiodarone 400 MG tablet Commonly known as: PACERONE Take 1 tablet (400 mg total) by mouth 2 (two) times daily for 5 days.   amiodarone 200 MG tablet Commonly known as: PACERONE Take 1 tablet (200 mg total) by mouth daily. Start taking on: March 31, 2021   apixaban 5 MG Tabs tablet Commonly known as: ELIQUIS Take 1 tablet (5 mg total) by mouth 2 (two) times daily.   atorvastatin 80 MG tablet Commonly known as: LIPITOR Take 1 tablet (80 mg total) by mouth daily at 6 PM. What changed: See the new instructions.   clopidogrel 75 MG tablet Commonly  known as: PLAVIX Take 1 tablet (75 mg total) by mouth daily.   famotidine 40 MG tablet Commonly known as: PEPCID Take 1 tablet (40 mg total) by mouth every evening.   Farxiga 10 MG Tabs tablet Generic drug: dapagliflozin propanediol TAKE 1 TABLET BY MOUTH ONCE A DAY. What changed: how much to take   furosemide 20 MG tablet Commonly known as: LASIX TAKE (1) TABLET BY MOUTH ONCE DAILY. TAKE AN ADDITIONAL 1 TABLET FOR 3 POUND WEIGHT GAIN OVERNIGHT OR 5 POUNDS IN A WEEK. What changed: See the new instructions.   methylPREDNISolone 4 MG Tbpk tablet Commonly known as: MEDROL DOSEPAK Medrol Dosepak take as instructed   metoprolol succinate 25 MG 24 hr tablet Commonly known as: TOPROL-XL Take 0.5 tablets (12.5 mg total) by mouth daily. What changed: how much to take   nitroGLYCERIN  0.4 MG SL tablet Commonly known as: NITROSTAT PLACE 1 TABLET (0.4 MG TOTAL) UNDER THE TONGUE EVERY FIVE MINUTES X 3 DOSES AS NEEDED FOR CHEST PAIN. What changed:  how much to take when to take this Another medication with the same name was removed. Continue taking this medication, and follow the directions you see here.   sacubitril-valsartan 24-26 MG Commonly known as: ENTRESTO TAKE 1 TABLET BY MOUTH TWO TIMES DAILY.        Follow-up Information     Ellsworth Lennox, PA-C Follow up on 04/16/2021.   Specialties: Physician Assistant, Cardiology Why: Cardiology Hospital Follow-up on 04/16/2021 at 3:30 PM. Contact information: [address] [place] [address] [phone]                No Known Allergies   Quit smoking:  It is highly recommended that all people especially deals with diabetes to quit smoking or stay away from smoking, avoid secondhand smoking.   Vaccines:  Also highly recommended update your vaccines requirement, including SARS-CoV-2 , yearly  flu vaccine and pneumonia vaccine at least every 5 years.      Exercise: If you are able: 30 -60 minutes a day, 4 days a  week, or 150 minutes a week.  The longer the better.  Combine stretch, strength, and aerobic activities.  If you were told in the past that you have high risk for cardiovascular diseases, you may seek evaluation by your heart doctor prior to initiating moderate to intense exercise programs.    One other important lifestyle recommendation is to ensure adequate sleep - at least 6-7 hours of uninterrupted sleep at night.  Procedures /Studies:   DG Ankle Complete Left  Result Date: 03/08/2021 CLINICAL DATA:  Swelling, pain EXAM: LEFT ANKLE COMPLETE - 3+ VIEW COMPARISON:  None. FINDINGS: There is no evidence of fracture, dislocation, or joint effusion. Alignment is unremarkable. The ankle mortise is preserved. Bidirectional calcaneal enthesophytes. Mild degenerative changes in the midfoot. Moderate soft tissue swelling about the medial greater than lateral lower leg and ankle. IMPRESSION: No acute fracture or dislocation. Electronically Signed   By: Wiliam Ke M.D.   On: 03/08/2021 01:23   DG Chest Port 1 View  Result Date: 03/23/2021 CLINICAL DATA:  Chest pain. EXAM: PORTABLE CHEST 1 VIEW COMPARISON:  Portable chest 07/28/2020 FINDINGS: There is mild cardiomegaly with coronary artery stents on the left and no vascular congestion findings, with interval resolution of prior interstitial edema and vascular distension. The lungs are clear. No pleural effusion is seen. Thoracic spondylosis. Mild osteopenia. IMPRESSION: No evidence of acute chest disease.  Mild cardiomegaly. Electronically Signed   By: Almira Bar M.D.   On: 03/23/2021 01:20    Subjective:   Patient was seen and examined 03/26/2021, 12:34 PM Patient stable today. No acute distress.  No issues overnight Stable for discharge.  Discharge Exam:    Vitals:   03/25/21 0345 03/25/21 1501 03/25/21 2131 03/26/21 0446  BP: (!) 125/52 (!) 127/55 (!) 142/67 (!) 142/69  Pulse: (!) 58 60 (!) 56 (!) 53  Resp: 20 17 18 18   Temp: 98.1 F  (36.7 C) 97.9 F (36.6 C) 97.7 F (36.5 C) 98 F (36.7 C)  TempSrc:  Oral Oral   SpO2: 96% 96% 96% 96%  Weight:      Height:        General: Pt lying comfortably in bed & appears in no obvious distress. Cardiovascular: S1 & S2 heard, RRR, S1/S2 +. No  murmurs, rubs, gallops or clicks. No JVD or pedal edema. Respiratory: Clear to auscultation without wheezing, rhonchi or crackles. No increased work of breathing. Abdominal:  Non-distended, non-tender & soft. No organomegaly or masses appreciated. Normal bowel sounds heard. CNS: Alert and oriented. No focal deficits. Extremities: no edema, no cyanosis      The results of significant diagnostics from this hospitalization (including imaging, microbiology, ancillary and laboratory) are listed below for reference.      Microbiology:   Recent Results (from the past 240 hour(s))  Resp Panel by RT-PCR (Flu A&B, Covid) Nasopharyngeal Swab     Status: None   Collection Time: 03/23/21 12:49 AM   Specimen: Nasopharyngeal Swab; Nasopharyngeal(NP) swabs in vial transport medium  Result Value Ref Range Status   SARS Coronavirus 2 by RT PCR NEGATIVE NEGATIVE Final    Comment: (NOTE) SARS-CoV-2 target nucleic acids are NOT DETECTED.  The SARS-CoV-2 RNA is generally detectable in upper respiratory specimens during the acute phase of infection. The lowest concentration of SARS-CoV-2 viral copies this assay can detect is 138 copies/mL. A negative result does not preclude SARS-Cov-2 infection and should not be used as the sole basis for treatment or other patient management decisions. A negative result may occur with  improper specimen collection/handling, submission of specimen other than nasopharyngeal swab, presence of viral mutation(s) within the areas targeted by this assay, and inadequate number of viral copies(<138 copies/mL). A negative result must be combined with clinical observations, patient history, and  epidemiological information. The expected result is Negative.  Fact Sheet for Patients:  BloggerCourse.com  Fact Sheet for Healthcare Providers:  SeriousBroker.it  This test is no t yet approved or cleared by the [place] FDA and  has been authorized for detection and/or diagnosis of SARS-CoV-2 by FDA under an Emergency Use Authorization (EUA). This EUA will remain  in effect (meaning this test can be used) for the duration of the COVID-19 declaration under Section 564(b)(1) of the Act, 21 U.S.C.section 360bbb-3(b)(1), unless the authorization is terminated  or revoked sooner.       Influenza A by PCR NEGATIVE NEGATIVE Final   Influenza B by PCR NEGATIVE NEGATIVE Final    Comment: (NOTE) The Xpert Xpress SARS-CoV-2/FLU/RSV plus assay is intended as an aid in the diagnosis of influenza from Nasopharyngeal swab specimens and should not be used as a sole basis for treatment. Nasal washings and aspirates are unacceptable for Xpert Xpress SARS-CoV-2/FLU/RSV testing.  Fact Sheet for Patients: BloggerCourse.com  Fact Sheet for Healthcare Providers: SeriousBroker.it  This test is not yet approved or cleared by the [place] FDA and has been authorized for detection and/or diagnosis of SARS-CoV-2 by FDA under an Emergency Use Authorization (EUA). This EUA will remain in effect (meaning this test can be used) for the duration of the COVID-19 declaration under Section 564(b)(1) of the Act, 21 U.S.C. section 360bbb-3(b)(1), unless the authorization is terminated or revoked.  Performed at [facility], [address], [place], [place] [address]      Labs:   CBC: Recent Labs  Lab 03/23/21 0049 03/24/21 0520  WBC 12.2* 7.3  NEUTROABS 7.9*  --   HGB 10.8* 9.1*  HCT 33.5* 29.3*  MCV 86.1 87.2  PLT 403* 343   Basic Metabolic Panel: Recent Labs  Lab 03/23/21 0047  03/23/21 0049 03/24/21 0520  NA  --  134* 139  K  --  3.1* 3.6  CL  --  102 111  CO2  --  19* 24  GLUCOSE  --  134* 107*  BUN  --  15 13  CREATININE  --  0.83 0.62  CALCIUM  --  8.6* 8.4*  MG 2.0  --   --   PHOS 3.1  --   --    Liver Function Tests: Recent Labs  Lab 03/24/21 0520  AST 10*  ALT 8  ALKPHOS 83  BILITOT 0.6  PROT 6.9  ALBUMIN 2.4*   BNP (last 3 results) Recent Labs    07/28/20 1211 03/23/21 0049  BNP 200.0* 105.0*   Cardiac Enzymes: No results for input(s): CKTOTAL, CKMB, CKMBINDEX, TROPONINI in the last 168 hours. CBG: Recent Labs  Lab 03/25/21 1159 03/25/21 1710 03/25/21 2109 03/26/21 0755 03/26/21 1221  GLUCAP 131* 153* 144* 139* 116*   Hgb A1c Recent Labs    03/24/21 1336  HGBA1C 5.3   Lipid Profile No results for input(s): CHOL, HDL, LDLCALC, TRIG, CHOLHDL, LDLDIRECT in the last 72 hours. Thyroid function studies Recent Labs    03/24/21 0520  TSH 3.457   Anemia work up No results for input(s): VITAMINB12, FOLATE, FERRITIN, TIBC, IRON, RETICCTPCT in the last 72 hours. Urinalysis    Component Value Date/Time   COLORURINE YELLOW 08/05/2016 1747   APPEARANCEUR Clear 02/27/2020 1530   LABSPEC 1.021 08/05/2016 1747   PHURINE 5.0 08/05/2016 1747   GLUCOSEU Negative 02/27/2020 1530   HGBUR NEGATIVE 08/05/2016 1747   BILIRUBINUR Negative 02/27/2020 1530   KETONESUR NEGATIVE 08/05/2016 1747   PROTEINUR Trace 02/27/2020 1530   PROTEINUR NEGATIVE 08/05/2016 1747   NITRITE Negative 02/27/2020 1530   NITRITE NEGATIVE 08/05/2016 1747   LEUKOCYTESUR 1+ (A) 02/27/2020 1530         Time coordinating discharge: Over 45 minutes  SIGNED: Kendell Bane, MD, FACP, FHM. [facility],  Please use [email or web address] to Page If 7PM-7AM, please contact night-coverage [email or web address],  03/26/2021, 12:34 PM

## 2021-03-26 NOTE — TOC Progression Note (Signed)
Transition of Care [facility]) - Progression Note    Patient Details  Name: [name] MRN: [id number] Date of Birth: 1966/02/07  [facility] CM/SW Contact  Elliot Gault, [name] Phone Number: 03/26/2021, 4:17 PM  Clinical Narrative:     TOC following. Insurance authorization for SNF is still pending. Plan remains for transfer to [facility] once authorization complete. Will follow up in AM.  Expected Discharge Plan: Skilled Nursing Facility Barriers to Discharge: Insurance Authorization  Expected Discharge Plan and Services Expected Discharge Plan: Skilled Nursing Facility In-house Referral: Clinical Social Work   Post Acute Care Choice: Skilled Nursing Facility Living arrangements for the past 2 months: Apartment Expected Discharge Date: 03/25/21                                     Social Determinants of Health (SDOH) Interventions    Readmission Risk Interventions No flowsheet data found.

## 2021-03-27 DIAGNOSIS — I480 Paroxysmal atrial fibrillation: Secondary | ICD-10-CM | POA: Diagnosis not present

## 2021-03-27 LAB — GLUCOSE, CAPILLARY
Glucose-Capillary: 117 mg/dL — ABNORMAL HIGH (ref 70–99)
Glucose-Capillary: 150 mg/dL — ABNORMAL HIGH (ref 70–99)
Glucose-Capillary: 135 mg/dL — ABNORMAL HIGH (ref 70–99)
Glucose-Capillary: 106 mg/dL — ABNORMAL HIGH (ref 70–99)

## 2021-03-27 MED ORDER — AMIODARONE HCL 200 MG PO TABS: 200.0000 mg | ORAL_TABLET | Freq: Every day | ORAL | 2 refills | Status: DC

## 2021-03-27 MED ORDER — AMIODARONE HCL 200 MG PO TABS
200.0000 mg | ORAL_TABLET | Freq: Every day | ORAL | Status: DC
  Administered 2021-03-28: 200 mg via ORAL
  Filled 2021-03-27: qty 1

## 2021-03-27 MED ORDER — PREDNISONE 10 MG PO TABS
50.0000 mg | ORAL_TABLET | Freq: Every day | ORAL | Status: DC
  Administered 2021-03-27: 50 mg via ORAL
  Filled 2021-03-27: qty 1

## 2021-03-27 NOTE — TOC Progression Note (Signed)
Transition of Care [facility]) - Progression Note    Patient Details  Name: Kristine Montgomery MRN: [id number] Date of Birth: 10/16/1965  Transition of [facility] CM/SW Contact  Elliot Gault, [name] Phone Number: 03/27/2021, 3:36 PM  Clinical Narrative:     TOC following. Pt still does not have insurance authorization for SNF rehab. Pt cannot transfer to [facility] until this authorization is received.  TOC will follow.  Expected Discharge Plan: Skilled Nursing Facility Barriers to Discharge: Insurance Authorization  Expected Discharge Plan and Services Expected Discharge Plan: Skilled Nursing Facility In-house Referral: Clinical Social Work   Post Acute Care Choice: Skilled Nursing Facility Living arrangements for the past 2 months: Apartment Expected Discharge Date: 03/25/21                                     Social Determinants of Health (SDOH) Interventions    Readmission Risk Interventions No flowsheet data found.

## 2021-03-27 NOTE — Progress Notes (Signed)
Physician Discharge Summary [facility]    Patient: Kristine Montgomery                   Admit date: 03/23/2021   DOB: 28-Feb-1966             Discharge date:03/27/2021/12:40 PM HEN:[id number]                          PCP: Elfredia Nevins, MD  Disposition:    SNF  Recommendations for Outpatient Follow-up:   Follow up: With cardiologist within 1-2 weeks, continue currently recommended medication for cardiology including amiodarone 400 p.o. twice daily for total of 14 days, Denti-Foam 200 mg p.o. daily, Brilinta discontinued, initiated Eliquis and Plavix Continue aggressive PT OT, fall precautions, Follow with PCP within 2-3 weeks  Discharge Condition: Stable   Code Status:   Code Status: Full Code  Diet recommendation: Cardiac diet   Patient was seen and examined in no acute distress, heart rate 45 overnight, asymptomatic, heart amiodarone to 400 mg p.o. twice daily was switched to 200 mg p.o. daily  Her beta-blocker and amiodarone will be held this morning...  Otherwise stable still complaining of generalized weakness especially on the left leg Awaiting rehab  Pending SNF placement, awaiting insurance approval   Discharge Diagnoses:    Principal Problem:   Paroxysmal atrial fibrillation (HCC) Active Problems:   Acute combined systolic and diastolic CHF, NYHA class 4 (HCC)   Mixed hyperlipidemia   NSTEMI (non-ST elevated myocardial infarction) (HCC)   Ischemic cardiomyopathy   Coronary artery disease involving native coronary artery of native heart with unstable angina pectoris (HCC)   Morbid obesity (HCC)   Tobacco abuse   Hypokalemia   Leukocytosis   Thrombocytosis   Elevated troponin   Hyperglycemia   History of Present Illness/ Hospital Course Kristine Montgomery Summary:    [name] is a 55 y.o. female with medical history significant for NSTEMI, CAD status post stent placement, ischemic cardiomyopathy, combined systolic and diastolic CHF who presents to the  emergency department due to palpitations which started few hours prior to arrival to the ED.  Work-up revealed new onset atrial fibrillation for which she was started on Cardizem drip, due to hypotension was switched to amiodarone drip then converted to sinus rhythm.  She was seen by cardiology and started on Eliquis for CVA prevention.  PT OT assessed and recommended SNF.  TOC assisting with SNF placement.   Hospital course complicated by left elbow and left ankle edema and tenderness with slight touch.  No prior history of gout however concern for developing gout.  Uric acid ordered and pending, started on IV Solu-Medrol due to concern for acute gout flare.     Assessment/Plan:    New onset, newly diagnosed paroxysmal atrial fibrillation In A. fib with RVR on presentation, initially received Cardizem drip, became hypotensive then was switched to amiodarone drip then she converted to sinus rhythm. CHA2DS2-VASc of 5, she was started on Eliquis by cardiology on 03/23/2021 - On oral Amiodarone -initially 400 twice daily for 14 days then 200 daily due to bradycardia has been reduced to 200 mg p.o. daily now  On Toprol-XL for rate control.... Due to bradycardia dosage reduced from 25 to 12.5 mg  Due to bradycardia, Toprol and amiodarone held today 03/27/2021    Elevated troponin, suspect demand ischemia in the setting of A. fib with RVR Cardiology was consulted, was managing Continue Plavix, Eliquis, statin.  Ischemic cardiomyopathy with acute combined systolic and diastolic CHF Was managed by cardiology She is currently on Toprol-XL 12.5 mg daily, Entresto 24-26 mg twice daily, Plavix 75 mg daily, Lipitor 80 mg daily, Amiodarone, and Eliquis. -Continue monitoring daily weight if > 5 lbs weight gain within 24 hours, notify cardiologist   Coronary artery disease status post PCI with stent placement on March 2022 Denies any anginal symptoms at the time of the visit Patient was on aspirin and  Brilinta due to addition to Eliquis for her paroxysmal A. fib aspirin and Brilinta was stopped and Plavix was added, continue Eliquis, Plavix, and high intensity statin.   Suspected gout flare, no prior history of gout Obtain uric acid Start IV Solu-Medrol 40 mg twice daily >>> switch to p.o. prednisone taper   Ambulatory dysfunction PT OT assessed and recommending SNF    Prediabetes, will likely be exacerbated by IV Solu-Medrol Started on Farxiga by cardiology -Monitor for hypoglycemia is being tapered down on steroids     Code Status: Full code   Family Communication: Daughter    Disposition Plan: Likely will discharge to SNF once bed is available, and insurance approval      Consultants: Cardiology, signed off on 03/24/2021.   Procedures: None   Antimicrobials: None      Discharge Instructions:   Discharge Instructions     Activity as tolerated - No restrictions   Complete by: As directed    Call MD for:  difficulty breathing, headache or visual disturbances   Complete by: As directed    Call MD for:  persistant dizziness or light-headedness   Complete by: As directed    Diet - low sodium heart healthy   Complete by: As directed    Discharge instructions   Complete by: As directed    Follow-up with PCP, cardiology within 1-2 wks ... Medication may need to be adjusted especially Eliquis, amiodarone, Plavix, BP meds. Continue aggressive PT OT, fall precautions   Increase activity slowly   Complete by: As directed         Medication List     STOP taking these medications    aspirin EC 81 MG tablet   spironolactone 25 MG tablet Commonly known as: ALDACTONE   ticagrelor 90 MG Tabs tablet Commonly known as: BRILINTA       TAKE these medications    amiodarone 200 MG tablet Commonly known as: PACERONE Take 1 tablet (200 mg total) by mouth daily.   apixaban 5 MG Tabs tablet Commonly known as: ELIQUIS Take 1 tablet (5 mg total) by mouth 2 (two)  times daily.   atorvastatin 80 MG tablet Commonly known as: LIPITOR Take 1 tablet (80 mg total) by mouth daily at 6 PM. What changed: See the new instructions.   clopidogrel 75 MG tablet Commonly known as: PLAVIX Take 1 tablet (75 mg total) by mouth daily.   famotidine 40 MG tablet Commonly known as: PEPCID Take 1 tablet (40 mg total) by mouth every evening.   Farxiga 10 MG Tabs tablet Generic drug: dapagliflozin propanediol TAKE 1 TABLET BY MOUTH ONCE A DAY. What changed: how much to take   furosemide 20 MG tablet Commonly known as: LASIX TAKE (1) TABLET BY MOUTH ONCE DAILY. TAKE AN ADDITIONAL 1 TABLET FOR 3 POUND WEIGHT GAIN OVERNIGHT OR 5 POUNDS IN A WEEK. What changed: See the new instructions.   methylPREDNISolone 4 MG Tbpk tablet Commonly known as: MEDROL DOSEPAK Medrol Dosepak take as instructed   metoprolol  succinate 25 MG 24 hr tablet Commonly known as: TOPROL-XL Take 0.5 tablets (12.5 mg total) by mouth daily. What changed: how much to take   nitroGLYCERIN 0.4 MG SL tablet Commonly known as: NITROSTAT PLACE 1 TABLET (0.4 MG TOTAL) UNDER THE TONGUE EVERY FIVE MINUTES X 3 DOSES AS NEEDED FOR CHEST PAIN. What changed:  how much to take when to take this Another medication with the same name was removed. Continue taking this medication, and follow the directions you see here.   sacubitril-valsartan 24-26 MG Commonly known as: ENTRESTO TAKE 1 TABLET BY MOUTH TWO TIMES DAILY.        Follow-up Information     Ellsworth Lennox, PA-C Follow up on 04/16/2021.   Specialties: Physician Assistant, Cardiology Why: Cardiology Hospital Follow-up on 04/16/2021 at 3:30 PM. Contact information: [address] [place] [address] [phone]                No Known Allergies   Quit smoking:  It is highly recommended that all people especially deals with diabetes to quit smoking or stay away from smoking, avoid secondhand smoking.   Vaccines:  Also  highly recommended update your vaccines requirement, including SARS-CoV-2 , yearly  flu vaccine and pneumonia vaccine at least every 5 years.      Exercise: If you are able: 30 -60 minutes a day, 4 days a week, or 150 minutes a week.  The longer the better.  Combine stretch, strength, and aerobic activities.  If you were told in the past that you have high risk for cardiovascular diseases, you may seek evaluation by your heart doctor prior to initiating moderate to intense exercise programs.    One other important lifestyle recommendation is to ensure adequate sleep - at least 6-7 hours of uninterrupted sleep at night.  Procedures /Studies:   DG Ankle Complete Left  Result Date: 03/08/2021 CLINICAL DATA:  Swelling, pain EXAM: LEFT ANKLE COMPLETE - 3+ VIEW COMPARISON:  None. FINDINGS: There is no evidence of fracture, dislocation, or joint effusion. Alignment is unremarkable. The ankle mortise is preserved. Bidirectional calcaneal enthesophytes. Mild degenerative changes in the midfoot. Moderate soft tissue swelling about the medial greater than lateral lower leg and ankle. IMPRESSION: No acute fracture or dislocation. Electronically Signed   By: Wiliam Ke M.D.   On: 03/08/2021 01:23   DG Chest Port 1 View  Result Date: 03/23/2021 CLINICAL DATA:  Chest pain. EXAM: PORTABLE CHEST 1 VIEW COMPARISON:  Portable chest 07/28/2020 FINDINGS: There is mild cardiomegaly with coronary artery stents on the left and no vascular congestion findings, with interval resolution of prior interstitial edema and vascular distension. The lungs are clear. No pleural effusion is seen. Thoracic spondylosis. Mild osteopenia. IMPRESSION: No evidence of acute chest disease.  Mild cardiomegaly. Electronically Signed   By: Almira Bar M.D.   On: 03/23/2021 01:20    Subjective:   Patient was seen and examined 03/27/2021, 12:40 PM Patient stable today. No acute distress.  No issues overnight Stable for  discharge.  Discharge Exam:    Vitals:   03/26/21 1446 03/26/21 2016 03/26/21 2331 03/27/21 0633  BP: 119/65 (!) 143/57 134/67 138/71  Pulse: (!) 51 (!) 55 (!) 49 (!) 45  Resp: 18     Temp: 97.7 F (36.5 C) 98 F (36.7 C)  98.4 F (36.9 C)  TempSrc:  Oral    SpO2: 98% 92%  93%  Weight:      Height:  Physical Exam:   General:  Alert, oriented, cooperative, no distress;   HEENT:  Normocephalic, PERRL, otherwise with in Normal limits   Neuro:  CNII-XII intact. , normal motor and sensation, reflexes intact   Lungs:   Clear to auscultation BL, Respirations unlabored, no wheezes / crackles  Cardio:    S1/S2, RRR, No murmure, No Rubs or Gallops   Abdomen:   Soft, non-tender, bowel sounds active all four quadrants,  no guarding or peritoneal signs.  Muscular skeletal:  Limited exam - in bed, able to move all 4 extremities, Normal strength,  2+ pulses,  symmetric, No pitting edema  Skin:  Dry, warm to touch, negative for any Rashes,  Wounds: Please see nursing documentation           The results of significant diagnostics from this hospitalization (including imaging, microbiology, ancillary and laboratory) are listed below for reference.      Microbiology:   Recent Results (from the past 240 hour(s))  Resp Panel by RT-PCR (Flu A&B, Covid) Nasopharyngeal Swab     Status: None   Collection Time: 03/23/21 12:49 AM   Specimen: Nasopharyngeal Swab; Nasopharyngeal(NP) swabs in vial transport medium  Result Value Ref Range Status   SARS Coronavirus 2 by RT PCR NEGATIVE NEGATIVE Final    Comment: (NOTE) SARS-CoV-2 target nucleic acids are NOT DETECTED.  The SARS-CoV-2 RNA is generally detectable in upper respiratory specimens during the acute phase of infection. The lowest concentration of SARS-CoV-2 viral copies this assay can detect is 138 copies/mL. A negative result does not preclude SARS-Cov-2 infection and should not be used as the sole basis for treatment  or other patient management decisions. A negative result may occur with  improper specimen collection/handling, submission of specimen other than nasopharyngeal swab, presence of viral mutation(s) within the areas targeted by this assay, and inadequate number of viral copies(<138 copies/mL). A negative result must be combined with clinical observations, patient history, and epidemiological information. The expected result is Negative.  Fact Sheet for Patients:  BloggerCourse.com  Fact Sheet for Healthcare Providers:  SeriousBroker.it  This test is no t yet approved or cleared by the [place] FDA and  has been authorized for detection and/or diagnosis of SARS-CoV-2 by FDA under an Emergency Use Authorization (EUA). This EUA will remain  in effect (meaning this test can be used) for the duration of the COVID-19 declaration under Section 564(b)(1) of the Act, 21 U.S.C.section 360bbb-3(b)(1), unless the authorization is terminated  or revoked sooner.       Influenza A by PCR NEGATIVE NEGATIVE Final   Influenza B by PCR NEGATIVE NEGATIVE Final    Comment: (NOTE) The Xpert Xpress SARS-CoV-2/FLU/RSV plus assay is intended as an aid in the diagnosis of influenza from Nasopharyngeal swab specimens and should not be used as a sole basis for treatment. Nasal washings and aspirates are unacceptable for Xpert Xpress SARS-CoV-2/FLU/RSV testing.  Fact Sheet for Patients: BloggerCourse.com  Fact Sheet for Healthcare Providers: SeriousBroker.it  This test is not yet approved or cleared by the [place] FDA and has been authorized for detection and/or diagnosis of SARS-CoV-2 by FDA under an Emergency Use Authorization (EUA). This EUA will remain in effect (meaning this test can be used) for the duration of the COVID-19 declaration under Section 564(b)(1) of the Act, 21 U.S.C. section  360bbb-3(b)(1), unless the authorization is terminated or revoked.  Performed at [facility], [address], [place], [place] [address]      Labs:   CBC:  Recent Labs  Lab 03/23/21 0049 03/24/21 0520  WBC 12.2* 7.3  NEUTROABS 7.9*  --   HGB 10.8* 9.1*  HCT 33.5* 29.3*  MCV 86.1 87.2  PLT 403* 343   Basic Metabolic Panel: Recent Labs  Lab 03/23/21 0047 03/23/21 0049 03/24/21 0520  NA  --  134* 139  K  --  3.1* 3.6  CL  --  102 111  CO2  --  19* 24  GLUCOSE  --  134* 107*  BUN  --  15 13  CREATININE  --  0.83 0.62  CALCIUM  --  8.6* 8.4*  MG 2.0  --   --   PHOS 3.1  --   --    Liver Function Tests: Recent Labs  Lab 03/24/21 0520  AST 10*  ALT 8  ALKPHOS 83  BILITOT 0.6  PROT 6.9  ALBUMIN 2.4*   BNP (last 3 results) Recent Labs    07/28/20 1211 03/23/21 0049  BNP 200.0* 105.0*   Cardiac Enzymes: No results for input(s): CKTOTAL, CKMB, CKMBINDEX, TROPONINI in the last 168 hours. CBG: Recent Labs  Lab 03/26/21 1221 03/26/21 1722 03/26/21 2014 03/27/21 0717 03/27/21 1118  GLUCAP 116* 112* 145* 117* 150*   Hgb A1c Recent Labs    03/24/21 1336  HGBA1C 5.3   Lipid Profile No results for input(s): CHOL, HDL, LDLCALC, TRIG, CHOLHDL, LDLDIRECT in the last 72 hours. Thyroid function studies No results for input(s): TSH, T4TOTAL, T3FREE, THYROIDAB in the last 72 hours.  Invalid input(s): FREET3  Anemia work up No results for input(s): VITAMINB12, FOLATE, FERRITIN, TIBC, IRON, RETICCTPCT in the last 72 hours. Urinalysis    Component Value Date/Time   COLORURINE YELLOW 08/05/2016 1747   APPEARANCEUR Clear 02/27/2020 1530   LABSPEC 1.021 08/05/2016 1747   PHURINE 5.0 08/05/2016 1747   GLUCOSEU Negative 02/27/2020 1530   HGBUR NEGATIVE 08/05/2016 1747   BILIRUBINUR Negative 02/27/2020 1530   KETONESUR NEGATIVE 08/05/2016 1747   PROTEINUR Trace 02/27/2020 1530   PROTEINUR NEGATIVE 08/05/2016 1747   NITRITE Negative 02/27/2020 1530    NITRITE NEGATIVE 08/05/2016 1747   LEUKOCYTESUR 1+ (A) 02/27/2020 1530         Time coordinating discharge: Over 45 minutes  SIGNED: Kendell Bane, MD, FACP, FHM. [facility],  Please use [email or web address] to Page If 7PM-7AM, please contact night-coverage [email or web address],  03/27/2021, 12:40 PM

## 2021-03-28 DIAGNOSIS — I480 Paroxysmal atrial fibrillation: Secondary | ICD-10-CM | POA: Diagnosis not present

## 2021-03-28 LAB — GLUCOSE, CAPILLARY
Glucose-Capillary: 87 mg/dL (ref 70–99)
Glucose-Capillary: 94 mg/dL (ref 70–99)
Glucose-Capillary: 149 mg/dL — ABNORMAL HIGH (ref 70–99)

## 2021-03-28 MED ORDER — METHYLPREDNISOLONE 4 MG PO TBPK: ORAL_TABLET | ORAL | 0 refills | Status: DC

## 2021-03-28 MED ORDER — PREDNISONE 20 MG PO TABS
40.0000 mg | ORAL_TABLET | Freq: Every day | ORAL | Status: DC
  Administered 2021-03-28: 40 mg via ORAL
  Filled 2021-03-28: qty 2

## 2021-03-28 NOTE — Progress Notes (Signed)
Nsg Discharge Note  Admit Date:  03/23/2021 Discharge date: 03/28/2021   Kristine Montgomery to be D/C'd Home per MD order.  AVS completed.  Copy for chart, and copy for patient signed, and dated. Patient/caregiver able to verbalize understanding.  Discharge Medication: Allergies as of 03/28/2021   No Known Allergies      Medication List     STOP taking these medications    aspirin EC 81 MG tablet   spironolactone 25 MG tablet Commonly known as: ALDACTONE   ticagrelor 90 MG Tabs tablet Commonly known as: BRILINTA       TAKE these medications    amiodarone 200 MG tablet Commonly known as: PACERONE Take 1 tablet (200 mg total) by mouth daily.   apixaban 5 MG Tabs tablet Commonly known as: ELIQUIS Take 1 tablet (5 mg total) by mouth 2 (two) times daily.   atorvastatin 80 MG tablet Commonly known as: LIPITOR Take 1 tablet (80 mg total) by mouth daily at 6 PM. What changed: See the new instructions.   clopidogrel 75 MG tablet Commonly known as: PLAVIX Take 1 tablet (75 mg total) by mouth daily.   famotidine 40 MG tablet Commonly known as: PEPCID Take 1 tablet (40 mg total) by mouth every evening.   Farxiga 10 MG Tabs tablet Generic drug: dapagliflozin propanediol TAKE 1 TABLET BY MOUTH ONCE A DAY. What changed: how much to take   furosemide 20 MG tablet Commonly known as: LASIX TAKE (1) TABLET BY MOUTH ONCE DAILY. TAKE AN ADDITIONAL 1 TABLET FOR 3 POUND WEIGHT GAIN OVERNIGHT OR 5 POUNDS IN A WEEK. What changed: See the new instructions.   methylPREDNISolone 4 MG Tbpk tablet Commonly known as: MEDROL DOSEPAK Medrol Dosepak take as instructed... Started from the 40 mg dose   metoprolol succinate 25 MG 24 hr tablet Commonly known as: TOPROL-XL Take 0.5 tablets (12.5 mg total) by mouth daily. What changed: how much to take   nitroGLYCERIN 0.4 MG SL tablet Commonly known as: NITROSTAT PLACE 1 TABLET (0.4 MG TOTAL) UNDER THE TONGUE EVERY FIVE MINUTES X 3 DOSES  AS NEEDED FOR CHEST PAIN. What changed:  how much to take when to take this Another medication with the same name was removed. Continue taking this medication, and follow the directions you see here.   sacubitril-valsartan 24-26 MG Commonly known as: ENTRESTO TAKE 1 TABLET BY MOUTH TWO TIMES DAILY.               Durable Medical Equipment  (From admission, onward)           Start     Ordered   03/28/21 1303  For home use only DME Walker  Once       Question:  Patient needs a walker to treat with the following condition  Answer:  Weakness   03/28/21 1303            Discharge Assessment: Vitals:   03/28/21 1244 03/28/21 1351  BP: 106/70 125/73  Pulse: 60 (!) 58  Resp:  18  Temp:  (!) 97.5 F (36.4 C)  SpO2:  95%   Skin clean, dry and intact without evidence of skin break down, no evidence of skin tears noted. IV catheter discontinued intact. Site without signs and symptoms of complications - no redness or edema noted at insertion site, patient denies c/o pain - only slight tenderness at site.  Dressing with slight pressure applied.  D/c Instructions-Education: Discharge instructions given to patient/family with verbalized understanding. D/c  education completed with patient/family including follow up instructions, medication list, d/c activities limitations if indicated, with other d/c instructions as indicated by MD - patient able to verbalize understanding, all questions fully answered. Patient instructed to return to ED, call 911, or call MD for any changes in condition.  Patient escorted via WC, and D/C home via private auto.  Brandy Hale, LPN 56/43/3295 1:88 PM

## 2021-03-28 NOTE — TOC Transition Note (Signed)
Transition of Care [facility]) - CM/SW Discharge Note   Patient Details  Name: Kristine Montgomery MRN: [id number] Date of Birth: August 27, 1965  [facility] CM/SW Contact:  Barry Brunner, [name] Phone Number: 03/28/2021, 12:47 PM   Clinical Narrative:    CSW notified of patient's readiness for discharge and request to discharge home with home health instead of SNF. CSW referred patient to [place] with [facility]. Barbara Cower agreeable to provide [facility] services. CSW also referred [facility] to [place] for [facility]. Morrie Sheldon agreeable to provide RW. TOC signing off.   Final next level of care: Home w Home Health Services Barriers to Discharge: Barriers Resolved   Patient Goals and CMS Choice Patient states their goals for this hospitalization and ongoing recovery are:: Return home with home health CMS Medicare.gov Compare Post Acute Care list provided to:: Patient Choice offered to / list presented to : Patient  Discharge Placement                    Patient and family notified of of transfer: 03/28/21  Discharge Plan and Services In-house Referral: Clinical Social Work   Post Acute Care Choice: Skilled Nursing Facility          DME Arranged: Dan Humphreys rolling DME Agency: [facility]       HH Arranged: PT, OT, Social Work HH Agency: [facility] Date HH Agency Contacted: 03/28/21 Time HH Agency Contacted: 1247    Social Determinants of Health (SDOH) Interventions     Readmission Risk Interventions No flowsheet data found.

## 2021-03-28 NOTE — Discharge Summary (Signed)
Physician Discharge Summary [facility]    Patient: Kristine Montgomery                   Admit date: 03/23/2021   DOB: 1965-09-28             Discharge date:03/28/2021/10:37 AM [id number]                          PCP: Elfredia Nevins, MD  Disposition:    Home with home health Recommendations for Outpatient Follow-up:   Follow up: With cardiologist within 1-2 weeks, continue currently recommended medication for cardiology including amiodarone 400 p.o. twice daily for total of 14 days, Denti-Foam 200 mg p.o. daily, Brilinta discontinued, initiated Eliquis and Plavix Continue aggressive PT OT, fall precautions, Follow with PCP within 2-3 weeks  Discharge Condition: Stable   Code Status:   Code Status: Full Code  Diet recommendation: Cardiac diet   The patient was awaiting for insurance approval to be discharged to SNF... Long wait, patient has chosen to be discharged home accepted home health   Discharge Diagnoses:    Principal Problem:   Paroxysmal atrial fibrillation (HCC) Active Problems:   Acute combined systolic and diastolic CHF, NYHA class 4 (HCC)   Mixed hyperlipidemia   NSTEMI (non-ST elevated myocardial infarction) (HCC)   Ischemic cardiomyopathy   Coronary artery disease involving native coronary artery of native heart with unstable angina pectoris (HCC)   Morbid obesity (HCC)   Tobacco abuse   Hypokalemia   Leukocytosis   Thrombocytosis   Elevated troponin   Hyperglycemia   History of Present Illness/ Hospital Course Kristine Montgomery Summary:    Kristine Montgomery is a 55 y.o. female with medical history significant for NSTEMI, CAD status post stent placement, ischemic cardiomyopathy, combined systolic and diastolic CHF who presents to the emergency department due to palpitations which started few hours prior to arrival to the ED.  Work-up revealed new onset atrial fibrillation for which she was started on Cardizem drip, due to hypotension was switched to  amiodarone drip then converted to sinus rhythm.  She was seen by cardiology and started on Eliquis for CVA prevention.  PT OT assessed and recommended SNF.  TOC assisting with SNF placement.   Hospital course complicated by left elbow and left ankle edema and tenderness with slight touch.  No prior history of gout however concern for developing gout.  Uric acid ordered and pending, started on IV Solu-Medrol due to concern for acute gout flare.     Assessment/Plan:    New onset, newly diagnosed paroxysmal atrial fibrillation In A. fib with RVR on presentation, initially received Cardizem drip, became hypotensive then was switched to amiodarone drip then she converted to sinus rhythm. CHA2DS2-VASc of 5, she was started on Eliquis by cardiology on 03/23/2021 - On oral Amiodarone -initially 400 twice daily for 14 days then 200 daily.Marland Kitchen BUT due to bradycardia it has been reduced to 200 mg p.o. daily now  On Toprol-XL for rate control.... Due to bradycardia dosage reduced from 25 to 12.5 mg  Due to bradycardia, Toprol and amiodarone held today 03/27/2021  -Amiodarone 200 mg daily -Toprol-XL 12.5 mg daily  Elevated troponin, suspect demand ischemia in the setting of A. fib with RVR Cardiology was consulted, was managing Continue Plavix, Eliquis, statin.   Ischemic cardiomyopathy with acute combined systolic and diastolic CHF Was managed by cardiology She is currently on Toprol-XL 12.5 mg daily, Entresto 24-26 mg twice  daily, Plavix 75 mg daily, Lipitor 80 mg daily, Amiodarone, and Eliquis. -Continue monitoring daily weight if > 5 lbs weight gain within 24 hours, notify cardiologist   Coronary artery disease status post PCI with stent placement on March 2022 Denies any anginal symptoms at the time of the visit Patient was on aspirin and Brilinta due to addition to Eliquis for her paroxysmal A. fib aspirin and Brilinta was stopped and Plavix was added, continue Eliquis, Plavix, and high intensity  statin.   Suspected gout flare, no prior history of gout Obtain uric acid Start IV Solu-Medrol 40 mg twice daily >>> switch to p.o. prednisone taper   Ambulatory dysfunction PT OT assessed and recommending SNF    Prediabetes, will likely be exacerbated by IV Solu-Medrol Started on Farxiga by cardiology -Monitor for hypoglycemia is being tapered down on steroids     Code Status: Full code   Family Communication: Daughter    Disposition Plan: Likely will discharge to SNF once bed is available, and insurance approval      Consultants: Cardiology, signed off on 03/24/2021.   Procedures: None   Antimicrobials: None      Discharge Instructions:   Discharge Instructions     Activity as tolerated - No restrictions   Complete by: As directed    Activity as tolerated - No restrictions   Complete by: As directed    Call MD for:  difficulty breathing, headache or visual disturbances   Complete by: As directed    Call MD for:  persistant dizziness or light-headedness   Complete by: As directed    Diet - low sodium heart healthy   Complete by: As directed    Diet - low sodium heart healthy   Complete by: As directed    Discharge instructions   Complete by: As directed    Follow-up with PCP, cardiology within 1-2 wks ... Medication may need to be adjusted especially Eliquis, amiodarone, Plavix, BP meds. Continue aggressive PT OT, fall precautions   Discharge instructions   Complete by: As directed    Follow-up with cardiologist within 1-2 weeks, continue adjust your medication (amiodarone and metoprolol )for your heart rate, also inquiring about blood thinners Eliquis and Plavix....  Follow-up with your PCP within 2-3 weeks   Increase activity slowly   Complete by: As directed    Increase activity slowly   Complete by: As directed         Medication List     STOP taking these medications    aspirin EC 81 MG tablet   spironolactone 25 MG tablet Commonly known  as: ALDACTONE   ticagrelor 90 MG Tabs tablet Commonly known as: BRILINTA       TAKE these medications    amiodarone 200 MG tablet Commonly known as: PACERONE Take 1 tablet (200 mg total) by mouth daily.   apixaban 5 MG Tabs tablet Commonly known as: ELIQUIS Take 1 tablet (5 mg total) by mouth 2 (two) times daily.   atorvastatin 80 MG tablet Commonly known as: LIPITOR Take 1 tablet (80 mg total) by mouth daily at 6 PM. What changed: See the new instructions.   clopidogrel 75 MG tablet Commonly known as: PLAVIX Take 1 tablet (75 mg total) by mouth daily.   famotidine 40 MG tablet Commonly known as: PEPCID Take 1 tablet (40 mg total) by mouth every evening.   Farxiga 10 MG Tabs tablet Generic drug: dapagliflozin propanediol TAKE 1 TABLET BY MOUTH ONCE A DAY. What changed: how  much to take   furosemide 20 MG tablet Commonly known as: LASIX TAKE (1) TABLET BY MOUTH ONCE DAILY. TAKE AN ADDITIONAL 1 TABLET FOR 3 POUND WEIGHT GAIN OVERNIGHT OR 5 POUNDS IN A WEEK. What changed: See the new instructions.   methylPREDNISolone 4 MG Tbpk tablet Commonly known as: MEDROL DOSEPAK Medrol Dosepak take as instructed... Started from the 40 mg dose   metoprolol succinate 25 MG 24 hr tablet Commonly known as: TOPROL-XL Take 0.5 tablets (12.5 mg total) by mouth daily. What changed: how much to take   nitroGLYCERIN 0.4 MG SL tablet Commonly known as: NITROSTAT PLACE 1 TABLET (0.4 MG TOTAL) UNDER THE TONGUE EVERY FIVE MINUTES X 3 DOSES AS NEEDED FOR CHEST PAIN. What changed:  how much to take when to take this Another medication with the same name was removed. Continue taking this medication, and follow the directions you see here.   sacubitril-valsartan 24-26 MG Commonly known as: ENTRESTO TAKE 1 TABLET BY MOUTH TWO TIMES DAILY.        Follow-up Information     Ellsworth Lennox, PA-C Follow up on 04/16/2021.   Specialties: Physician Assistant, Cardiology Why:  Cardiology Hospital Follow-up on 04/16/2021 at 3:30 PM. Contact information: [address] [place] [address] [phone]                No Known Allergies   Quit smoking:  It is highly recommended that all people especially deals with diabetes to quit smoking or stay away from smoking, avoid secondhand smoking.   Vaccines:  Also highly recommended update your vaccines requirement, including SARS-CoV-2 , yearly  flu vaccine and pneumonia vaccine at least every 5 years.      Exercise: If you are able: 30 -60 minutes a day, 4 days a week, or 150 minutes a week.  The longer the better.  Combine stretch, strength, and aerobic activities.  If you were told in the past that you have high risk for cardiovascular diseases, you may seek evaluation by your heart doctor prior to initiating moderate to intense exercise programs.    One other important lifestyle recommendation is to ensure adequate sleep - at least 6-7 hours of uninterrupted sleep at night.  Procedures /Studies:   DG Ankle Complete Left  Result Date: 03/08/2021 CLINICAL DATA:  Swelling, pain EXAM: LEFT ANKLE COMPLETE - 3+ VIEW COMPARISON:  None. FINDINGS: There is no evidence of fracture, dislocation, or joint effusion. Alignment is unremarkable. The ankle mortise is preserved. Bidirectional calcaneal enthesophytes. Mild degenerative changes in the midfoot. Moderate soft tissue swelling about the medial greater than lateral lower leg and ankle. IMPRESSION: No acute fracture or dislocation. Electronically Signed   By: Wiliam Ke M.D.   On: 03/08/2021 01:23   DG Chest Port 1 View  Result Date: 03/23/2021 CLINICAL DATA:  Chest pain. EXAM: PORTABLE CHEST 1 VIEW COMPARISON:  Portable chest 07/28/2020 FINDINGS: There is mild cardiomegaly with coronary artery stents on the left and no vascular congestion findings, with interval resolution of prior interstitial edema and vascular distension. The lungs are clear. No pleural  effusion is seen. Thoracic spondylosis. Mild osteopenia. IMPRESSION: No evidence of acute chest disease.  Mild cardiomegaly. Electronically Signed   By: Almira Bar M.D.   On: 03/23/2021 01:20    Subjective:   Patient was seen and examined 03/28/2021, 10:37 AM Patient stable today. No acute distress.  No issues overnight Stable for discharge.  Discharge Exam:    Vitals:   03/27/21 9147  03/27/21 1440 03/27/21 2056 03/28/21 0521  BP: 138/71 (!) 155/88 128/69 133/72  Pulse: (!) 45 (!) 54 (!) 54 (!) 51  Resp:  18 18 18   Temp: 98.4 F (36.9 C) 97.8 F (36.6 C) 98.1 F (36.7 C) (!) 97.5 F (36.4 C)  TempSrc:  Oral Oral Oral  SpO2: 93% 97% 94% 96%  Weight:      Height:           Physical Exam:   General:  Alert, oriented, cooperative, no distress;   HEENT:  Normocephalic, PERRL, otherwise with in Normal limits   Neuro:  CNII-XII intact. , normal motor and sensation, reflexes intact   Lungs:   Clear to auscultation BL, Respirations unlabored, no wheezes / crackles  Cardio:    S1/S2, RRR, No murmure, No Rubs or Gallops   Abdomen:   Soft, non-tender, bowel sounds active all four quadrants,  no guarding or peritoneal signs.  Muscular skeletal:  Limited exam - in bed, able to move all 4 extremities, Normal strength,  2+ pulses,  symmetric, No pitting edema  Skin:  Dry, warm to touch, negative for any Rashes,  Wounds: Please see nursing documentation           The results of significant diagnostics from this hospitalization (including imaging, microbiology, ancillary and laboratory) are listed below for reference.      Microbiology:   Recent Results (from the past 240 hour(s))  Resp Panel by RT-PCR (Flu A&B, Covid) Nasopharyngeal Swab     Status: None   Collection Time: 03/23/21 12:49 AM   Specimen: Nasopharyngeal Swab; Nasopharyngeal(NP) swabs in vial transport medium  Result Value Ref Range Status   SARS Coronavirus 2 by RT PCR NEGATIVE NEGATIVE Final    Comment:  (NOTE) SARS-CoV-2 target nucleic acids are NOT DETECTED.  The SARS-CoV-2 RNA is generally detectable in upper respiratory specimens during the acute phase of infection. The lowest concentration of SARS-CoV-2 viral copies this assay can detect is 138 copies/mL. A negative result does not preclude SARS-Cov-2 infection and should not be used as the sole basis for treatment or other patient management decisions. A negative result may occur with  improper specimen collection/handling, submission of specimen other than nasopharyngeal swab, presence of viral mutation(s) within the areas targeted by this assay, and inadequate number of viral copies(<138 copies/mL). A negative result must be combined with clinical observations, patient history, and epidemiological information. The expected result is Negative.  Fact Sheet for Patients:  13/07/22  Fact Sheet for Healthcare Providers:  BloggerCourse.com  This test is no t yet approved or cleared by the [employer] FDA and  has been authorized for detection and/or diagnosis of SARS-CoV-2 by FDA under an Emergency Use Authorization (EUA). This EUA will remain  in effect (meaning this test can be used) for the duration of the COVID-19 declaration under Section 564(b)(1) of the Act, 21 U.S.C.section 360bbb-3(b)(1), unless the authorization is terminated  or revoked sooner.       Influenza A by PCR NEGATIVE NEGATIVE Final   Influenza B by PCR NEGATIVE NEGATIVE Final    Comment: (NOTE) The Xpert Xpress SARS-CoV-2/FLU/RSV plus assay is intended as an aid in the diagnosis of influenza from Nasopharyngeal swab specimens and should not be used as a sole basis for treatment. Nasal washings and aspirates are unacceptable for Xpert Xpress SARS-CoV-2/FLU/RSV testing.  Fact Sheet for Patients: [place]  Fact Sheet for Healthcare  Providers: BloggerCourse.com  This test is not yet approved or cleared by the  [place] [employer] and has been authorized for detection and/or diagnosis of SARS-CoV-2 by FDA under an TEFL teacher (EUA). This EUA will remain in effect (meaning this test can be used) for the duration of the COVID-19 declaration under Section 564(b)(1) of the Act, 21 U.S.C. section 360bbb-3(b)(1), unless the authorization is terminated or revoked.  Performed at [facility], [address], [place], [place] [address]      Labs:   CBC: Recent Labs  Lab 03/23/21 0049 03/24/21 0520  WBC 12.2* 7.3  NEUTROABS 7.9*  --   HGB 10.8* 9.1*  HCT 33.5* 29.3*  MCV 86.1 87.2  PLT 403* 343   Basic Metabolic Panel: Recent Labs  Lab 03/23/21 0047 03/23/21 0049 03/24/21 0520  NA  --  134* 139  K  --  3.1* 3.6  CL  --  102 111  CO2  --  19* 24  GLUCOSE  --  134* 107*  BUN  --  15 13  CREATININE  --  0.83 0.62  CALCIUM  --  8.6* 8.4*  MG 2.0  --   --   PHOS 3.1  --   --    Liver Function Tests: Recent Labs  Lab 03/24/21 0520  AST 10*  ALT 8  ALKPHOS 83  BILITOT 0.6  PROT 6.9  ALBUMIN 2.4*   BNP (last 3 results) Recent Labs    07/28/20 1211 03/23/21 0049  BNP 200.0* 105.0*   Cardiac Enzymes: No results for input(s): CKTOTAL, CKMB, CKMBINDEX, TROPONINI in the last 168 hours. CBG: Recent Labs  Lab 03/27/21 0717 03/27/21 1118 03/27/21 1609 03/27/21 2316 03/28/21 0731  GLUCAP 117* 150* 135* 106* 87   Hgb A1c No results for input(s): HGBA1C in the last 72 hours.  Lipid Profile No results for input(s): CHOL, HDL, LDLCALC, TRIG, CHOLHDL, LDLDIRECT in the last 72 hours. Thyroid function studies No results for input(s): TSH, T4TOTAL, T3FREE, THYROIDAB in the last 72 hours.  Invalid input(s): FREET3  Anemia work up No results for input(s): VITAMINB12, FOLATE, FERRITIN, TIBC, IRON, RETICCTPCT in the last 72 hours. Urinalysis    Component Value  Date/Time   COLORURINE YELLOW 08/05/2016 1747   APPEARANCEUR Clear 02/27/2020 1530   LABSPEC 1.021 08/05/2016 1747   PHURINE 5.0 08/05/2016 1747   GLUCOSEU Negative 02/27/2020 1530   HGBUR NEGATIVE 08/05/2016 1747   BILIRUBINUR Negative 02/27/2020 1530   KETONESUR NEGATIVE 08/05/2016 1747   PROTEINUR Trace 02/27/2020 1530   PROTEINUR NEGATIVE 08/05/2016 1747   NITRITE Negative 02/27/2020 1530   NITRITE NEGATIVE 08/05/2016 1747   LEUKOCYTESUR 1+ (A) 02/27/2020 1530         Time coordinating discharge: Over 45 minutes  SIGNED: Kendell Bane, MD, FACP, FHM. [facility],  Please use [email or web address] to Page If 7PM-7AM, please contact night-coverage [email or web address],  03/28/2021, 10:37 AM

## 2021-03-30 NOTE — Progress Notes (Signed)
Cardiology Office Note   Date:  03/31/2021   ID:  PHIL MACMILLAN, DOB 12-Dec-1965, MRN [id number]  PCP:  Redmond School, MD  Cardiologist:  Dr. Harrington Challenger    Chief Complaint  Patient presents with   Hospitalization Follow-up    PAF      History of Present Illness: Kristine Montgomery is a 55 y.o. female who presents for post hospital.    She has a hx of CAD  admitted 03/23/21 for a fib with RVR. history of CAD s/p NSTEMI in 07/2020 with DES to prox-LAD and DES to prox-LCx and medical management recommended of occluded RCA, ICM, HFrEF previously 30% echo 07/2020 and 55-60% 11/2020, HTN, HLD.  After stressful day she developed palpitations went to ER and in a fib with RVR, placed on IV dilt but BP dropped so changed to amiodarone.  She converted to SR.    PLACED ON EILIQUIS AND PLAVIX.  NO ASA.  ON STATIN.  ALDACTONE ADDED.  AMIODARONE 400 bid FOR 2 WEEKS, THEN 200 MG DAILY. BB decreased to 12.5 daily for slow HR.  ?  Sleep study.  Hs troponin 100 -demand ischemia and no further ischemic testing  For EF 30% in 2022 and more recently improved to 55-60%  Today she is ding well though has had a couple of episodes of fast HR but does not last.  She has no bleeding on eliquis.  She is dizzy some but BP and HR are stable when she checks at home.  She does have anxiety and is to see Dr. Gerarda Fraction next week. She will discuss with him.  Also he will do sleep study she tells me.    Occ lt lateral chest discomfort but lasts a few min. Has not needed NTG.  Different from her  NSTEMI in March. We reviewed cath report from March and reassured abut totalled RCA with collateral circulation.   We discussed diet as well.   Past Medical History:  Diagnosis Date   Acute combined systolic and diastolic CHF, NYHA class 4 ([facility]) 07/29/2020   a. EF 30% by echo in 07/2020 b. EF 55-60% by echo in 11/2020   Arthritis    Asthma    CAD (coronary artery disease)    a. 07/2020: s/p NSTEMI with DES to proxLAD and DES to  proxLCx with medical management recommended of the occluded RCA.   Chronic bronchitis (HCC)    COPD (chronic obstructive pulmonary disease) (HCC)    Edema extremities    History of hiatal hernia    Hyperlipidemia LDL goal <70 07/29/2020   Ovarian cyst    Pneumonia     Past Surgical History:  Procedure Laterality Date   CESAREAN SECTION     CORONARY STENT INTERVENTION N/A 07/29/2020   Procedure: CORONARY STENT INTERVENTION;  Surgeon: Troy Sine, MD;  Location: [facility];  Service: Cardiovascular;  Laterality: N/A;   KNEE ARTHROSCOPY WITH MEDIAL MENISECTOMY Right 05/23/2020   Procedure: KNEE ARTHROSCOPY WITH MEDIAL AND LATERAL MENISECTOMY; 3 COMPARTMENT SYNOVECTOMY;  Surgeon: Carole Civil, MD;  Location: [facility];  Service: Orthopedics;  Laterality: Right;   RIGHT/LEFT HEART CATH AND CORONARY ANGIOGRAPHY N/A 07/29/2020   Procedure: RIGHT/LEFT HEART CATH AND CORONARY ANGIOGRAPHY;  Surgeon: Troy Sine, MD;  Location: [facility];  Service: Cardiovascular;  Laterality: N/A;   TONSILLECTOMY     TUBAL LIGATION       Current Outpatient Medications  Medication Sig Dispense Refill   amiodarone (PACERONE)  200 MG tablet Take 1 tablet (200 mg total) by mouth daily. 30 tablet 2   apixaban (ELIQUIS) 5 MG TABS tablet Take 1 tablet (5 mg total) by mouth 2 (two) times daily. 60 tablet 3   atorvastatin (LIPITOR) 80 MG tablet Take 1 tablet (80 mg total) by mouth daily at 6 PM. 30 tablet 1   clopidogrel (PLAVIX) 75 MG tablet Take 1 tablet (75 mg total) by mouth daily. 30 tablet 0   famotidine (PEPCID) 40 MG tablet Take 1 tablet (40 mg total) by mouth every evening. 30 tablet 1   FARXIGA 10 MG TABS tablet TAKE 1 TABLET BY MOUTH ONCE A DAY. (Patient taking differently: Take 10 mg by mouth daily.) 30 tablet 2   furosemide (LASIX) 20 MG tablet TAKE (1) TABLET BY MOUTH ONCE DAILY. TAKE AN ADDITIONAL 1 TABLET FOR 3 POUND WEIGHT GAIN OVERNIGHT OR 5 POUNDS IN A WEEK. (Patient taking  differently: Take 20 mg by mouth daily. Take additional tablet for 3lb weight gain overnight or 5lbs in a week) 30 tablet 9   methylPREDNISolone (MEDROL DOSEPAK) 4 MG TBPK tablet Medrol Dosepak take as instructed... Started from the 40 mg dose 21 tablet 0   metoprolol succinate (TOPROL-XL) 25 MG 24 hr tablet Take 0.5 tablets (12.5 mg total) by mouth daily. 15 tablet 2   nitroGLYCERIN (NITROSTAT) 0.4 MG SL tablet PLACE 1 TABLET (0.4 MG TOTAL) UNDER THE TONGUE EVERY FIVE MINUTES X 3 DOSES AS NEEDED FOR CHEST PAIN. (Patient taking differently: Place 0.4 mg under the tongue every 5 (five) minutes as needed for chest pain.) 25 tablet 12   sacubitril-valsartan (ENTRESTO) 24-26 MG TAKE 1 TABLET BY MOUTH TWO TIMES DAILY. 60 tablet 11   No current facility-administered medications for this visit.    Allergies:   Patient has no known allergies.    Social History:  The patient  reports that she has quit smoking. Her smoking use included cigarettes. She has a 15.00 pack-year smoking history. She has never used smokeless tobacco. She reports that she does not currently use alcohol. She reports that she does not use drugs.   Family History:  The patient's family history includes COPD in her father; Cancer in her maternal grandmother, mother, and paternal grandfather; Congestive Heart Failure in her mother; Diabetes in her mother; Hypertension in her sister; Non-Hodgkin's lymphoma in her mother; Other in her son.    ROS:  General:no colds or fevers, + weight decrease over 30 lbs.  Skin:no rashes or ulcers HEENT:no blurred vision, no congestion CV:see HPI PUL:see HPI GI:no diarrhea constipation or melena, no indigestion GU:no hematuria, no dysuria MS:no joint pain, no claudication Neuro:no syncope, no lightheadedness Endo:no diabetes, no thyroid disease  Wt Readings from Last 3 Encounters:  03/31/21 233 lb (105.7 kg)  03/23/21 236 lb 5.3 oz (107.2 kg)  03/08/21 238 lb (108 kg)     PHYSICAL  EXAM: VS:  BP 106/60   Pulse 63   Ht 5\' 2"  (1.575 m)   Wt 233 lb (105.7 kg)   LMP 06/27/2017 (Within Weeks)   SpO2 97%   BMI 42.62 kg/m  , BMI Body mass index is 42.62 kg/m. General:Pleasant affect, NAD Skin:Warm and dry, brisk capillary refill HEENT:normocephalic, sclera clear, mucus membranes moist Neck:supple, no JVD, no bruits  Heart:S1S2 RRR without murmur, gallup, rub or click Lungs:clear without rales, rhonchi, or wheezes 08/25/2017, non tender, + BS, do not palpate liver spleen or masses Ext:no lower ext edema, 2+ pedal pulses, 2+ radial  pulses Neuro:alert and oriented X 3, MAE, follows commands, + facial symmetry    EKG:  EKG is NOT ordered today.   Recent Labs: 03/23/2021: B Natriuretic Peptide 105.0; Magnesium 2.0 03/24/2021: ALT 8; BUN 13; Creatinine, Ser 0.62; Hemoglobin 9.1; Platelets 343; Potassium 3.6; Sodium 139; TSH 3.457    Lipid Panel    Component Value Date/Time   CHOL 211 (H) 07/29/2020 0236   TRIG 186 (H) 07/29/2020 0236   HDL 22 (L) 07/29/2020 0236   CHOLHDL 9.6 07/29/2020 0236   VLDL 37 07/29/2020 0236   LDLCALC 152 (H) 07/29/2020 0236       Other studies Reviewed: Additional studies/ records that were reviewed today include: . Echocardiogram: 07/2020 IMPRESSIONS     1. Poor acoustic windows limit study Definity used. Overall LVEF is  approximately 30% with diffuse hypokinesis worse in the septal, mid/distal  anterior, distal inferior, distal lateral and apical walls. . The left  ventricular internal cavity size was  mildly to moderately dilated. Left ventricular diastolic parameters are  indeterminate.   2. Right ventricular systolic function is normal. The right ventricular  size is normal.   3. Mild mitral valve regurgitation.   4. The aortic valve is tricuspid. Aortic valve regurgitation is not  visualized. Mild aortic valve sclerosis is present, with no evidence of  aortic valve stenosis.   5. The inferior vena cava is dilated in  size with >50% respiratory  variability, suggesting right atrial pressure of 8 mmHg.    Cardiac Catheterization: 07/2020 Prox LAD lesion is 95% stenosed. Ramus lesion is 40% stenosed. Ost LAD lesion is 20% stenosed. Ost Cx lesion is 30% stenosed. Prox Cx lesion is 95% stenosed. Prox RCA to Mid RCA lesion is 100% stenosed. Post intervention, there is a 0% residual stenosis. Post intervention, there is a 0% residual stenosis. A stent was successfully placed. A stent was successfully placed.   Severe multivessel CAD with 30% smooth ostial narrowing of the LAD followed by focal 95% eccentric proximal stenosis; smooth 40% ostial narrowing of a ramus intermediate vessel; 30% ostial narrowing of the circumflex followed by 95% proximal stenosis on a bend in the proximal circumflex; and total Lee occluded proximal RCA with both bridging antegrade collaterals as well as retrograde collaterals supplying the distal RCA.   Moderately elevated right heart pressures with very hypertension with a mean PA pressure at 41 mm.   Successful two-vessel coronary intervention involving a 95% proximal LAD stenosis with insertion of a 3.5 x 15 mm Resolute Onyx stent postdilated to 3.75 mm and 95% proximal left circumflex stenosis with insertion of a 3.0 x 15 mm Resolute Onyx stent postdilated to 3.3 mm with the stenoses being reduced to 0%.   RECOMMENDATION: DAPT with aspirin/Brilinta for minimum of 1 year.  Bivalirudin will be discontinued upon completion of the current bag with sheath pull several hours later.  Patient was given furosemide 40 mg IV at the completion of the procedure.  She will be started on carvedilol 3.125 mg twice a day and either ARB or Entresto tomorrow.  Hopefully LV function will improve with revascularization of both the LAD and circumflex vessels.   Echocardiogram: 11/2020 IMPRESSIONS     1. Limited study.   2. Left ventricular ejection fraction, by estimation, is 55 to 60%. The  left  ventricle has normal function. The left ventricle demonstrates  regional wall motion abnormalities (see scoring diagram/findings for  description).   3. Right ventricular systolic function is normal. The right ventricular  size is normal.   4. A small pericardial effusion is present. The pericardial effusion is  circumferential.   5. The inferior vena cava is normal in size with greater than 50%  respiratory variability, suggesting right atrial pressure of 3 mmHg.     ASSESSMENT AND PLAN:  1.  PAF maintaining SR on amiodarone, HR at home 50s to 60s, her BB was decreased after HR in 40s in hospital.  Hospital records reviewed.   She has had some racing HR brief so will continue amiodarone 200 mg daily.  Next visit will need CMP and thyroid evaluated.  Sleep study per PCP  2.  CAD with stents to LAD and LCX and RCA 100% obstructed with collateral circulation.  Reassured about her CAD. She has some chest discomfort lt lateral side if this increases she will let us know.  But different from NSTEMI.    3.  HLD on statin, continue lipids in April  4.  HTN on lower end now.  She does monitor at home and will call if < [id number] systolic   5.  CM resolved EF normal. Continue entresto as tolerates and lasix.  Still recommend low salt diet.  6.  Hx of tobacco use and has stopped.  Congratulated.    7.  Anxiety per PCP but reassured about her heart disease.    Keep follow up with Ms Ahmed Prima, PA-c.    Current medicines are reviewed with the patient today.  The patient Has no concerns regarding medicines.  The following changes have been made:  See above Labs/ tests ordered today include:see above  Disposition:   FU:  see above  Signed, Cecilie Kicks, NP  03/31/2021 12:57 PM    [facility] [place], [place], [place], [place] Phone: [phone]; Fax: [phone]

## 2021-03-31 ENCOUNTER — Other Ambulatory Visit: Payer: Self-pay

## 2021-03-31 ENCOUNTER — Encounter: Payer: Self-pay | Admitting: Cardiology

## 2021-03-31 ENCOUNTER — Ambulatory Visit (INDEPENDENT_AMBULATORY_CARE_PROVIDER_SITE_OTHER): Payer: 59 | Admitting: Cardiology

## 2021-03-31 VITALS — BP 106/60 | HR 63 | Ht 62.0 in | Wt 233.0 lb

## 2021-03-31 DIAGNOSIS — I2510 Atherosclerotic heart disease of native coronary artery without angina pectoris: Secondary | ICD-10-CM

## 2021-03-31 DIAGNOSIS — E785 Hyperlipidemia, unspecified: Secondary | ICD-10-CM | POA: Diagnosis not present

## 2021-03-31 DIAGNOSIS — I480 Paroxysmal atrial fibrillation: Secondary | ICD-10-CM

## 2021-03-31 DIAGNOSIS — I10 Essential (primary) hypertension: Secondary | ICD-10-CM | POA: Diagnosis not present

## 2021-03-31 DIAGNOSIS — F419 Anxiety disorder, unspecified: Secondary | ICD-10-CM

## 2021-03-31 DIAGNOSIS — F17200 Nicotine dependence, unspecified, uncomplicated: Secondary | ICD-10-CM

## 2021-03-31 NOTE — Patient Instructions (Signed)
Medication Instructions:  Your physician recommends that you continue on your current medications as directed. Please refer to the Current Medication list given to you today.   Labwork: None   Testing/Procedures: None   Follow-Up: Keep apt already scheduled with B.Strader, PA-C  Any Other Special Instructions Will Be Listed Below (If Applicable).  If you need a refill on your cardiac medications before your next appointment, please call your pharmacy.

## 2021-04-16 ENCOUNTER — Ambulatory Visit: Payer: Medicaid Other | Admitting: Student

## 2021-04-27 ENCOUNTER — Telehealth: Payer: Self-pay | Admitting: Internal Medicine

## 2021-04-27 ENCOUNTER — Other Ambulatory Visit: Payer: Self-pay | Admitting: Student

## 2021-04-27 ENCOUNTER — Other Ambulatory Visit: Payer: Self-pay | Admitting: Physician Assistant

## 2021-04-27 NOTE — Telephone Encounter (Signed)
Pt does not have Plavix listed on her medication list. Please advise.

## 2021-04-27 NOTE — Telephone Encounter (Signed)
*  STAT* If patient is at the pharmacy, call can be transferred to refill team.   1. Which medications need to be refilled? (please list name of each medication and dose if known) Clopidogrel 75 MG  2. Which pharmacy/location (including street and city if local pharmacy) is medication to be sent to? [facility] - [place], [place] - [address]  3. Do they need a 30 day or 90 day supply?  30 day supply  Patient states she is completely out of medication.

## 2021-04-28 ENCOUNTER — Other Ambulatory Visit: Payer: Self-pay | Admitting: Student

## 2021-04-28 MED ORDER — CLOPIDOGREL BISULFATE 75 MG PO TABS: 75.0000 mg | ORAL_TABLET | Freq: Every day | ORAL | 2 refills | Status: DC

## 2021-05-18 NOTE — Progress Notes (Signed)
Cardiology Office Note    Date:  05/19/2021   ID:  Kristine Montgomery, DOB 19-Jun-1965, MRN [id number]  PCP:  Elfredia Nevins, MD  Cardiologist: Dietrich Pates, MD    Chief Complaint  Patient presents with   Follow-up    History of Present Illness:    Kristine Montgomery is a 56 y.o. 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   She was last examined by Nada Boozer, NP in 03/2021 following a recent hospitalization for atrial fibrillation with RVR during which she converted back to NSR with IV Amiodarone. She did report occasional palpitations but no persistent symptoms. She was continued on Amiodarone 200mg  daily and Toprol-XL 12.5mg  daily as she had bradycardia with higher dosing in the hospital.   In talking with the patient today, she reports overall doing well since her last office visit. Still having some intermittent palpitations but symptoms typically resolve within a few minutes. No recent chest pain or progressive dyspnea. No orthopnea, PND or pitting edema. She has been experiencing pain along her right great toe and right elbow which resemble her prior gout. Previously on steroids following hospital discharge for gout and symptoms resolved but returned earlier this week.   Past Medical History:  Diagnosis Date   Acute combined systolic and diastolic CHF, NYHA class 4 (HCC) 07/29/2020   a. EF 30% by echo in 07/2020 b. EF 55-60% by echo in 11/2020   Arthritis    Asthma    CAD (coronary artery disease)    a. 07/2020: s/p NSTEMI with DES to proxLAD and DES to proxLCx with medical management recommended of the occluded RCA.   Chronic bronchitis (HCC)    COPD (chronic obstructive pulmonary disease)  (HCC)    Edema extremities    History of hiatal hernia    Hyperlipidemia LDL goal <70 07/29/2020   Ovarian cyst    Pneumonia     Past Surgical History:  Procedure Laterality Date   CESAREAN SECTION     CORONARY STENT INTERVENTION N/A 07/29/2020   Procedure: CORONARY STENT INTERVENTION;  Surgeon: Lennette Bihari, MD;  Location: [facility];  Service: Cardiovascular;  Laterality: N/A;   KNEE ARTHROSCOPY WITH MEDIAL MENISECTOMY Right 05/23/2020   Procedure: KNEE ARTHROSCOPY WITH MEDIAL AND LATERAL MENISECTOMY; 3 COMPARTMENT SYNOVECTOMY;  Surgeon: Vickki Hearing, MD;  Location: [facility];  Service: Orthopedics;  Laterality: Right;   RIGHT/LEFT HEART CATH AND CORONARY ANGIOGRAPHY N/A 07/29/2020   Procedure: RIGHT/LEFT HEART CATH AND CORONARY ANGIOGRAPHY;  Surgeon: Lennette Bihari, MD;  Location: [facility];  Service: Cardiovascular;  Laterality: N/A;   TONSILLECTOMY     TUBAL LIGATION      Current Medications: Outpatient Medications Prior to Visit  Medication Sig Dispense Refill   apixaban (ELIQUIS) 5 MG TABS tablet Take 1 tablet (5 mg total) by mouth 2 (two) times daily. 60 tablet 3   atorvastatin (LIPITOR) 80 MG tablet Take 1 tablet (80 mg total) by mouth daily at 6 PM. 30 tablet 1   clopidogrel (PLAVIX) 75 MG tablet TAKE ONE TABLET BY MOUTH ONCE DAILY. 90 tablet 3   famotidine (PEPCID) 40 MG tablet Take 1 tablet (40 mg total) by mouth  every evening. 30 tablet 1   FARXIGA 10 MG TABS tablet TAKE 1 TABLET BY MOUTH ONCE A DAY. 30 tablet 1   furosemide (LASIX) 20 MG tablet TAKE (1) TABLET BY MOUTH ONCE DAILY. TAKE AN ADDITIONAL 1 TABLET FOR 3 POUND WEIGHT GAIN OVERNIGHT OR 5 POUNDS IN A WEEK. (Patient taking differently: Take 20 mg by mouth daily. Take additional tablet for 3lb weight gain overnight or 5lbs in a week) 30 tablet 9   metoprolol succinate (TOPROL-XL) 25 MG 24 hr tablet Take 0.5 tablets (12.5 mg total) by mouth daily. 15 tablet 2   nitroGLYCERIN (NITROSTAT) 0.4 MG SL  tablet PLACE 1 TABLET (0.4 MG TOTAL) UNDER THE TONGUE EVERY FIVE MINUTES X 3 DOSES AS NEEDED FOR CHEST PAIN. (Patient taking differently: Place 0.4 mg under the tongue every 5 (five) minutes as needed for chest pain.) 25 tablet 12   sacubitril-valsartan (ENTRESTO) 24-26 MG TAKE 1 TABLET BY MOUTH TWO TIMES DAILY. 60 tablet 11   amiodarone (PACERONE) 200 MG tablet Take 1 tablet (200 mg total) by mouth daily. 30 tablet 2   clopidogrel (PLAVIX) 75 MG tablet Take 1 tablet (75 mg total) by mouth daily. 90 tablet 2   methylPREDNISolone (MEDROL DOSEPAK) 4 MG TBPK tablet Medrol Dosepak take as instructed... Started from the 40 mg dose 21 tablet 0   No facility-administered medications prior to visit.     Allergies:   Patient has no known allergies.   Social History   Socioeconomic History   Marital status: Single    Spouse name: Not on file   Number of children: 2   Years of education: 12   Highest education level: Not on file  Occupational History   Not on file  Tobacco Use   Smoking status: Former    Packs/day: 0.50    Years: 30.00    Pack years: 15.00    Types: Cigarettes   Smokeless tobacco: Never   Tobacco comments:    pt states last cigarette was 3/13, prior to coming into hospital. Pt plans to continue cessation.  Vaping Use   Vaping Use: Never used  Substance and Sexual Activity   Alcohol use: Not Currently    Comment: occasional   Drug use: No   Sexual activity: Yes    Birth control/protection: Post-menopausal, Surgical    Comment: tubal  Other Topics Concern   Not on file  Social History Narrative   Not on file   Social Determinants of Health   Financial Resource Strain: Low Risk    Difficulty of Paying Living Expenses: Not very hard  Food Insecurity: Not on file  Transportation Needs: Not on file  Physical Activity: Not on file  Stress: Not on file  Social Connections: Socially Isolated   Frequency of Communication with Friends and Family: More than three times  a week   Frequency of Social Gatherings with Friends and Family: More than three times a week   Attends Religious Services: Never   Database administratorActive Member of Clubs or Organizations: No   Attends BankerClub or Organization Meetings: Never   Marital Status: Widowed     Family History:  The patient's family history includes COPD in her father; Cancer in her maternal grandmother, mother, and paternal grandfather; Congestive Heart Failure in her mother; Diabetes in her mother; Hypertension in her sister; Non-Hodgkin's lymphoma in her mother; Other in her son.   Review of Systems:    Please see the history of present illness.     All  other systems reviewed and are otherwise negative except as noted above.   Physical Exam:    VS:  BP 112/60    Pulse 69    Ht 5\' 1"  (1.549 m)    Wt 232 lb 3.2 oz (105.3 kg)    LMP 06/27/2017 (Within Weeks)    SpO2 97%    BMI 43.87 kg/m    General: Well developed, well nourished,female appearing in no acute distress. Head: Normocephalic, atraumatic. Neck: No carotid bruits. JVD not elevated.  Lungs: Respirations regular and unlabored, without wheezes or rales.  Heart: Regular rate and rhythm. No S3 or S4.  No murmur, no rubs, or gallops appreciated. Abdomen: Appears non-distended. No obvious abdominal masses. Msk:  Strength and tone appear normal for age. No obvious joint deformities or effusions. Extremities: No clubbing or cyanosis. No pitting edema.  Distal pedal pulses are 2+ bilaterally. Right elbow tender to palpation. No erythema.  Neuro: Alert and oriented X 3. Moves all extremities spontaneously. No focal deficits noted. Psych:  Responds to questions appropriately with a normal affect. Skin: No rashes or lesions noted  Wt Readings from Last 3 Encounters:  05/19/21 232 lb 3.2 oz (105.3 kg)  03/31/21 233 lb (105.7 kg)  03/23/21 236 lb 5.3 oz (107.2 kg)     Studies/Labs Reviewed:   EKG:  EKG is not ordered today.   Recent Labs: 03/23/2021: B Natriuretic  Peptide 105.0; Magnesium 2.0 03/24/2021: ALT 8; BUN 13; Creatinine, Ser 0.62; Hemoglobin 9.1; Platelets 343; Potassium 3.6; Sodium 139; TSH 3.457   Lipid Panel    Component Value Date/Time   CHOL 211 (H) 07/29/2020 0236   TRIG 186 (H) 07/29/2020 0236   HDL 22 (L) 07/29/2020 0236   CHOLHDL 9.6 07/29/2020 0236   VLDL 37 07/29/2020 0236   LDLCALC 152 (H) 07/29/2020 0236    Additional studies/ records that were reviewed today include:   R/LHC: 07/2020 Prox LAD lesion is 95% stenosed. Ramus lesion is 40% stenosed. Ost LAD lesion is 20% stenosed. Ost Cx lesion is 30% stenosed. Prox Cx lesion is 95% stenosed. Prox RCA to Mid RCA lesion is 100% stenosed. Post intervention, there is a 0% residual stenosis. Post intervention, there is a 0% residual stenosis. A stent was successfully placed. A stent was successfully placed.   Severe multivessel CAD with 30% smooth ostial narrowing of the LAD followed by focal 95% eccentric proximal stenosis; smooth 40% ostial narrowing of a ramus intermediate vessel; 30% ostial narrowing of the circumflex followed by 95% proximal stenosis on a bend in the proximal circumflex; and total Lee occluded proximal RCA with both bridging antegrade collaterals as well as retrograde collaterals supplying the distal RCA.   Moderately elevated right heart pressures with very hypertension with a mean PA pressure at 41 mm.   Successful two-vessel coronary intervention involving a 95% proximal LAD stenosis with insertion of a 3.5 x 15 mm Resolute Onyx stent postdilated to 3.75 mm and 95% proximal left circumflex stenosis with insertion of a 3.0 x 15 mm Resolute Onyx stent postdilated to 3.3 mm with the stenoses being reduced to 0%.   RECOMMENDATION: DAPT with aspirin/Brilinta for minimum of 1 year.  Bivalirudin will be discontinued upon completion of the current bag with sheath pull several hours later.  Patient was given furosemide 40 mg IV at the completion of the  procedure.  She will be started on carvedilol 3.125 mg twice a day and either ARB or Entresto tomorrow.  Hopefully LV function will improve with  revascularization of both the LAD and circumflex vessels.  Limited Echo: 11/2020 IMPRESSIONS     1. Limited study.   2. Left ventricular ejection fraction, by estimation, is 55 to 60%. The  left ventricle has normal function. The left ventricle demonstrates  regional wall motion abnormalities (see scoring diagram/findings for  description).   3. Right ventricular systolic function is normal. The right ventricular  size is normal.   4. A small pericardial effusion is present. The pericardial effusion is  circumferential.   5. The inferior vena cava is normal in size with greater than 50%  respiratory variability, suggesting right atrial pressure of 3 mmHg.    Assessment:    1. Coronary artery disease involving native coronary artery of native heart without angina pectoris   2. NYHA class 2 heart failure with preserved ejection fraction, with improvement of ejection fraction from prior measurement (HCC)   3. PAF (paroxysmal atrial fibrillation) (HCC)   4. Medication management   5. Essential hypertension   6. Acute gout of right elbow, unspecified cause      Plan:   In order of problems listed above:  1. CAD - She is s/p NSTEMI in 07/2020 with DES to prox-LAD and DES to prox-LCx and medical management recommended of occluded RCA.  - She denies any recent chest pain or progressive dyspnea.  - Currently on Plavix 75mg  daily, Toprol-XL 12.5mg  daily and Atorvastatin 80mg  daily. I will send a message to Dr. to see if she wishes for the patient to continue Plavix beyond 07/2020 or stop at that time. If stopping, will also clarify if she wants her to be on ASA or not given the use of anticoagulation.   2. HFimpEF - Her EF was previously at 30% by echo in 07/2020, improved to 55-60% by echo in 11/2020. She appears euvolemic on examination  today.  - Continue current medication regimen with Farxiga 10mg  daily, Toprol-XL 12.5mg  daily and Entresto 24-26mg  BID.   3. Paroxysmal Atrial Fibrillation - This was a new diagnosis for her in 03/2021 and she converted back to NSR with IV Amiodarone. Was recommended she have follow-up with EP to discuss other antiarrhythmic options given her relatively young age but this has not been arranged. Will refer to EP today. She has been on Amiodarone 200mg  daily and will reduce to 100mg  daily. Recheck TSH and LFT's. She has been on low-dose Toprol-XL 12.5mg  daily as her HR was in the 40's to 50's at times during her hospitalization when on higher doses.  - No reports of active bleeding. She remains on Eliquis 5mg  BID for anticoagulation.   4. HTN - Her blood pressure is well-controlled at 112/60 during today's visit. Continue current medication regimen.   5. Gout - She has a history of gout and reports worsening pain along her right great toe and right elbow similar to prior flares as pain is exacerbated even with a sheet touching the areas. Will prescribe Colchicine 0.6mg  BID for up to 10 days. I encouraged her to follow-up with her PCP if symptoms persist. Would avoid NSAIDS given the use of anticoagulation.    Medication Adjustments/Labs and Tests Ordered: Current medicines are reviewed at length with the patient today.  Concerns regarding medicines are outlined above.  Medication changes, Labs and Tests ordered today are listed in the Patient Instructions below. Patient Instructions  Medication Instructions:  Your physician has recommended you make the following change in your medication:  Start Colchicine 0.6 mg tablets twice daily  for 10 days. DECREASE Amiodarone to 100 mg daily (cut 200 mg tablet in half)   *If you need a refill on your cardiac medications before your next appointment, please call your pharmacy*   Lab Work: CMET TSH  If you have labs (blood work) drawn today and your  tests are completely normal, you will receive your results only by: MyChart Message (if you have MyChart) OR A paper copy in the mail If you have any lab test that is abnormal or we need to change your treatment, we will call you to review the results.   Testing/Procedures: None   Follow-Up: At [facility], you and your health needs are our priority.  As part of our continuing mission to provide you with exceptional heart care, we have created designated Provider Care Teams.  These Care Teams include your primary Cardiologist (physician) and Advanced Practice Providers (APPs -  Physician Assistants and Nurse Practitioners) who all work together to provide you with the care you need, when you need it.  We recommend signing up for the patient portal called "MyChart".  Sign up information is provided on this After Visit Summary.  MyChart is used to connect with patients for Virtual Visits (Telemedicine).  Patients are able to view lab/test results, encounter notes, upcoming appointments, etc.  Non-urgent messages can be sent to your provider as well.   To learn more about what you can do with MyChart, go to [email or web address].    Your next appointment:    3 months   The format for your next appointment:   In person  Provider:    Dietrich PatesPaula Ross, MD Randall AnBrittany Yechezkel Fertig, PA-C   Other Instructions You have been referred to EP. They will call you with your first appt.      Signed, Ellsworth LennoxBrittany M Boluwatife Flight, PA-C  05/19/2021 7:49 PM    [facility] [address] [place], [place] [address] Phone: [phone] Fax: [phone]

## 2021-05-19 ENCOUNTER — Ambulatory Visit (INDEPENDENT_AMBULATORY_CARE_PROVIDER_SITE_OTHER): Payer: 59 | Admitting: Student

## 2021-05-19 ENCOUNTER — Other Ambulatory Visit: Payer: Self-pay

## 2021-05-19 ENCOUNTER — Encounter: Payer: Self-pay | Admitting: Student

## 2021-05-19 VITALS — BP 112/60 | HR 69 | Ht 61.0 in | Wt 232.2 lb

## 2021-05-19 DIAGNOSIS — I5030 Unspecified diastolic (congestive) heart failure: Secondary | ICD-10-CM | POA: Diagnosis not present

## 2021-05-19 DIAGNOSIS — I10 Essential (primary) hypertension: Secondary | ICD-10-CM

## 2021-05-19 DIAGNOSIS — Z79899 Other long term (current) drug therapy: Secondary | ICD-10-CM

## 2021-05-19 DIAGNOSIS — I480 Paroxysmal atrial fibrillation: Secondary | ICD-10-CM

## 2021-05-19 DIAGNOSIS — I2510 Atherosclerotic heart disease of native coronary artery without angina pectoris: Secondary | ICD-10-CM | POA: Diagnosis not present

## 2021-05-19 DIAGNOSIS — M109 Gout, unspecified: Secondary | ICD-10-CM

## 2021-05-19 MED ORDER — COLCHICINE 0.6 MG PO TABS: 0.6000 mg | ORAL_TABLET | Freq: Two times a day (BID) | ORAL | 0 refills | Status: DC

## 2021-05-19 MED ORDER — AMIODARONE HCL 200 MG PO TABS: 100.0000 mg | ORAL_TABLET | Freq: Every day | ORAL | 3 refills | Status: DC

## 2021-05-19 NOTE — Patient Instructions (Addendum)
Medication Instructions:  Your physician has recommended you make the following change in your medication:  Start Colchicine 0.6 mg tablets twice daily for 10 days. DECREASE Amiodarone to 100 mg daily (cut 200 mg tablet in half)   *If you need a refill on your cardiac medications before your next appointment, please call your pharmacy*   Lab Work: CMET TSH  If you have labs (blood work) drawn today and your tests are completely normal, you will receive your results only by: MyChart Message (if you have MyChart) OR A paper copy in the mail If you have any lab test that is abnormal or we need to change your treatment, we will call you to review the results.   Testing/Procedures: None   Follow-Up: At [facility], you and your health needs are our priority.  As part of our continuing mission to provide you with exceptional heart care, we have created designated Provider Care Teams.  These Care Teams include your primary Cardiologist (physician) and Advanced Practice Providers (APPs -  Physician Assistants and Nurse Practitioners) who all work together to provide you with the care you need, when you need it.  We recommend signing up for the patient portal called "MyChart".  Sign up information is provided on this After Visit Summary.  MyChart is used to connect with patients for Virtual Visits (Telemedicine).  Patients are able to view lab/test results, encounter notes, upcoming appointments, etc.  Non-urgent messages can be sent to your provider as well.   To learn more about what you can do with MyChart, go to [email or web address].    Your next appointment:    3 months   The format for your next appointment:   In person  Provider:    Dietrich Pates, MD Randall An, PA-C   Other Instructions You have been referred to EP. They will call you with your first appt.

## 2021-05-21 ENCOUNTER — Other Ambulatory Visit: Payer: Self-pay

## 2021-05-21 ENCOUNTER — Encounter: Payer: Self-pay | Admitting: Internal Medicine

## 2021-05-21 ENCOUNTER — Ambulatory Visit (INDEPENDENT_AMBULATORY_CARE_PROVIDER_SITE_OTHER): Payer: 59 | Admitting: Internal Medicine

## 2021-05-21 VITALS — BP 114/72 | HR 68 | Ht 61.0 in | Wt 231.0 lb

## 2021-05-21 DIAGNOSIS — I25110 Atherosclerotic heart disease of native coronary artery with unstable angina pectoris: Secondary | ICD-10-CM | POA: Diagnosis not present

## 2021-05-21 DIAGNOSIS — I480 Paroxysmal atrial fibrillation: Secondary | ICD-10-CM | POA: Diagnosis not present

## 2021-05-21 DIAGNOSIS — I5042 Chronic combined systolic (congestive) and diastolic (congestive) heart failure: Secondary | ICD-10-CM | POA: Diagnosis not present

## 2021-05-21 MED ORDER — FAMOTIDINE 40 MG PO TABS: 40.0000 mg | ORAL_TABLET | Freq: Every evening | ORAL | 3 refills | Status: DC

## 2021-05-21 MED ORDER — MULTAQ 400 MG PO TABS
400.0000 mg | ORAL_TABLET | Freq: Two times a day (BID) | ORAL | 11 refills | Status: DC
Start: 2021-05-21 — End: 2021-09-14

## 2021-05-21 MED ORDER — ATORVASTATIN CALCIUM 80 MG PO TABS: 80.0000 mg | ORAL_TABLET | Freq: Every day | ORAL | 3 refills | Status: DC

## 2021-05-21 MED ORDER — DAPAGLIFLOZIN PROPANEDIOL 10 MG PO TABS: 10.0000 mg | ORAL_TABLET | Freq: Every day | ORAL | 3 refills | Status: DC

## 2021-05-21 NOTE — Patient Instructions (Signed)
Medication Instructions:   Complete current supply of Amiodarone then stop and start Multaq  Start Multaq 400 mg Two Times Daily with Food.   *If you need a refill on your cardiac medications before your next appointment, please call your pharmacy*   Lab Work: NONE   If you have labs (blood work) drawn today and your tests are completely normal, you will receive your results only by: [place] (if you have MyChart) OR A paper copy in the mail If you have any lab test that is abnormal or we need to change your treatment, we will call you to review the results.   Testing/Procedures: NONE    Follow-Up: At [facility], you and your health needs are our priority.  As part of our continuing mission to provide you with exceptional heart care, we have created designated Provider Care Teams.  These Care Teams include your primary Cardiologist (physician) and Advanced Practice Providers (APPs -  Physician Assistants and Nurse Practitioners) who all work together to provide you with the care you need, when you need it.  We recommend signing up for the patient portal called "MyChart".  Sign up information is provided on this After Visit Summary.  MyChart is used to connect with patients for Virtual Visits (Telemedicine).  Patients are able to view lab/test results, encounter notes, upcoming appointments, etc.  Non-urgent messages can be sent to your provider as well.   To learn more about what you can do with MyChart, go to [email or web address].    Your next appointment:   6 month(s)  The format for your next appointment:   In Person  Provider:   Cristopher Peru, MD    Other Instructions Thank you for choosing [facility]!

## 2021-05-21 NOTE — Progress Notes (Signed)
HPI Kristine Montgomery is referred today by Dr. Wyline Mood for evaluation of atrial fib. She is a morbidly obese woman with a h/o CAD s/p MI, s/P PCI who developed atrial fib and was placed on amiodarone. She has maintained NSR. Her EF was down but most recently has normalized. She has been reduced to low dose amiodarone and appears to be tolerating the medication.   No Known Allergies   Current Outpatient Medications  Medication Sig Dispense Refill   amiodarone (PACERONE) 200 MG tablet Take 0.5 tablets (100 mg total) by mouth daily. 45 tablet 3   apixaban (ELIQUIS) 5 MG TABS tablet Take 1 tablet (5 mg total) by mouth 2 (two) times daily. 60 tablet 3   atorvastatin (LIPITOR) 80 MG tablet Take 1 tablet (80 mg total) by mouth daily at 6 PM. 30 tablet 1   clopidogrel (PLAVIX) 75 MG tablet TAKE ONE TABLET BY MOUTH ONCE DAILY. 90 tablet 3   colchicine 0.6 MG tablet Take 1 tablet (0.6 mg total) by mouth 2 (two) times daily for 10 days. 20 tablet 0   famotidine (PEPCID) 40 MG tablet Take 1 tablet (40 mg total) by mouth every evening. 30 tablet 1   FARXIGA 10 MG TABS tablet TAKE 1 TABLET BY MOUTH ONCE A DAY. 30 tablet 1   furosemide (LASIX) 20 MG tablet TAKE (1) TABLET BY MOUTH ONCE DAILY. TAKE AN ADDITIONAL 1 TABLET FOR 3 POUND WEIGHT GAIN OVERNIGHT OR 5 POUNDS IN A WEEK. (Patient taking differently: Take 20 mg by mouth daily. Take additional tablet for 3lb weight gain overnight or 5lbs in a week) 30 tablet 9   metoprolol succinate (TOPROL-XL) 25 MG 24 hr tablet Take 0.5 tablets (12.5 mg total) by mouth daily. 15 tablet 2   nitroGLYCERIN (NITROSTAT) 0.4 MG SL tablet PLACE 1 TABLET (0.4 MG TOTAL) UNDER THE TONGUE EVERY FIVE MINUTES X 3 DOSES AS NEEDED FOR CHEST PAIN. (Patient taking differently: Place 0.4 mg under the tongue every 5 (five) minutes as needed for chest pain.) 25 tablet 12   sacubitril-valsartan (ENTRESTO) 24-26 MG TAKE 1 TABLET BY MOUTH TWO TIMES DAILY. 60 tablet 11   No current  facility-administered medications for this visit.     Past Medical History:  Diagnosis Date   Acute combined systolic and diastolic CHF, NYHA class 4 (HCC) 07/29/2020   a. EF 30% by echo in 07/2020 b. EF 55-60% by echo in 11/2020   Arthritis    Asthma    CAD (coronary artery disease)    a. 07/2020: s/p NSTEMI with DES to proxLAD and DES to proxLCx with medical management recommended of the occluded RCA.   Chronic bronchitis (HCC)    COPD (chronic obstructive pulmonary disease) (HCC)    Edema extremities    History of hiatal hernia    Hyperlipidemia LDL goal <70 07/29/2020   Ovarian cyst    Pneumonia     ROS:   All systems reviewed and negative except as noted in the HPI.   Past Surgical History:  Procedure Laterality Date   CESAREAN SECTION     CORONARY STENT INTERVENTION N/A 07/29/2020   Procedure: CORONARY STENT INTERVENTION;  Surgeon: Lennette Bihari, MD;  Location: [facility];  Service: Cardiovascular;  Laterality: N/A;   KNEE ARTHROSCOPY WITH MEDIAL MENISECTOMY Right 05/23/2020   Procedure: KNEE ARTHROSCOPY WITH MEDIAL AND LATERAL MENISECTOMY; 3 COMPARTMENT SYNOVECTOMY;  Surgeon: Vickki Hearing, MD;  Location: [facility];  Service: Orthopedics;  Laterality:  Right;   RIGHT/LEFT HEART CATH AND CORONARY ANGIOGRAPHY N/A 07/29/2020   Procedure: RIGHT/LEFT HEART CATH AND CORONARY ANGIOGRAPHY;  Surgeon: Lennette BihariKelly, Thomas A, MD;  Location: [facility];  Service: Cardiovascular;  Laterality: N/A;   TONSILLECTOMY     TUBAL LIGATION       Family History  Problem Relation Age of Onset   Cancer Paternal Grandfather        colon   Cancer Maternal Grandmother        lung   COPD Father    Non-Hodgkin's lymphoma Mother    Diabetes Mother    Cancer Mother        cervical   Congestive Heart Failure Mother    Hypertension Sister    Other Son        fluid around heart     Social History   Socioeconomic History   Marital status: Single    Spouse name: Not on file    Number of children: 2   Years of education: 12   Highest education level: Not on file  Occupational History   Not on file  Tobacco Use   Smoking status: Former    Packs/day: 0.50    Years: 30.00    Pack years: 15.00    Types: Cigarettes   Smokeless tobacco: Never   Tobacco comments:    pt states last cigarette was 3/13, prior to coming into hospital. Pt plans to continue cessation.  Vaping Use   Vaping Use: Never used  Substance and Sexual Activity   Alcohol use: Not Currently    Comment: occasional   Drug use: No   Sexual activity: Yes    Birth control/protection: Post-menopausal, Surgical    Comment: tubal  Other Topics Concern   Not on file  Social History Narrative   Not on file   Social Determinants of Health   Financial Resource Strain: Low Risk    Difficulty of Paying Living Expenses: Not very hard  Food Insecurity: Not on file  Transportation Needs: Not on file  Physical Activity: Not on file  Stress: Not on file  Social Connections: Socially Isolated   Frequency of Communication with Friends and Family: More than three times a week   Frequency of Social Gatherings with Friends and Family: More than three times a week   Attends Religious Services: Never   Database administratorActive Member of Clubs or Organizations: No   Attends BankerClub or Organization Meetings: Never   Marital Status: Widowed  Intimate Partner Violence: Not on file     BP 114/72    Pulse 68    Ht 5\' 1"  (1.549 m)    Wt 231 lb (104.8 kg)    LMP 06/27/2017 (Within Weeks)    SpO2 97%    BMI 43.65 kg/m   Physical Exam:  Well appearing obese NAD HEENT: Unremarkable Neck:  No JVD, no thyromegally Lymphatics:  No adenopathy Back:  No CVA tenderness Lungs:  Clear with no wheezes HEART:  Regular rate rhythm, no murmurs, no rubs, no clicks Abd:  soft, positive bowel sounds, no organomegally, no rebound, no guarding Ext:  2 plus pulses, no edema, no cyanosis, no clubbing Skin:  No rashes no nodules Neuro:  CN II  through XII intact, motor grossly intact  Assess/Plan PAF - she is maintaining NSR on low dose amio but her young age makes long term amio less than ideal. I have recommended the take multaq 400 mg twice daily with food once she runs out  of her amiodarone. She might be a candidate for an ablation if she can lose some more weight. Obesity - she has lost 30 lbs and I have encouraged her to lose more, ideally getting undero 200 lbs. HTN - her bp is controlled. Chronic systolic heart failure - her EF has normalized. She will continue her current meds.  Sharlot Gowda Casimer Russett,MD

## 2021-05-22 ENCOUNTER — Telehealth: Payer: Self-pay | Admitting: Student

## 2021-05-22 NOTE — Telephone Encounter (Signed)
Left message for patient to call back regarding medication changes 

## 2021-05-22 NOTE — Telephone Encounter (Signed)
Pt agreeable to stopping Plavix 75 mg tablets at the end of 07/2021 and starting ASA 81.  Pt had no questions or concerns at the moment.

## 2021-05-22 NOTE — Telephone Encounter (Signed)
° ° °  Please let the patient know I did review her medications with Dr. Tenny Craw as we discussed during her last office visit. Should continue Plavix 75 mg daily and Eliquis 5 mg twice daily through the end of 07/2021 and then she can stop Plavix and switch to ASA 81 mg daily while remaining on Eliquis 5 mg twice daily.  Signed, Ellsworth Lennox, PA-C 05/22/2021, 1:17 PM Pager: [phone]

## 2021-06-02 ENCOUNTER — Emergency Department (HOSPITAL_COMMUNITY)
Admission: EM | Admit: 2021-06-02 | Discharge: 2021-06-02 | Disposition: A | Discharge status: Home / Self Care | Payer: 59 | Attending: Emergency Medicine | Admitting: Emergency Medicine

## 2021-06-02 ENCOUNTER — Emergency Department (HOSPITAL_COMMUNITY): Payer: 59

## 2021-06-02 ENCOUNTER — Other Ambulatory Visit: Payer: Self-pay

## 2021-06-02 ENCOUNTER — Encounter (HOSPITAL_COMMUNITY): Payer: Self-pay

## 2021-06-02 DIAGNOSIS — Z7901 Long term (current) use of anticoagulants: Secondary | ICD-10-CM | POA: Diagnosis not present

## 2021-06-02 DIAGNOSIS — Z7902 Long term (current) use of antithrombotics/antiplatelets: Secondary | ICD-10-CM | POA: Insufficient documentation

## 2021-06-02 DIAGNOSIS — M1990 Unspecified osteoarthritis, unspecified site: Secondary | ICD-10-CM | POA: Diagnosis not present

## 2021-06-02 DIAGNOSIS — D649 Anemia, unspecified: Secondary | ICD-10-CM | POA: Diagnosis not present

## 2021-06-02 DIAGNOSIS — M25521 Pain in right elbow: Secondary | ICD-10-CM | POA: Diagnosis present

## 2021-06-02 LAB — CBC WITH DIFFERENTIAL/PLATELET
Abs Immature Granulocytes: 0.01 10*3/uL (ref 0.00–0.07)
Basophils Absolute: 0.1 10*3/uL (ref 0.0–0.1)
Basophils Relative: 1 %
Eosinophils Absolute: 0.1 10*3/uL (ref 0.0–0.5)
Eosinophils Relative: 2 %
HCT: 33.7 % — ABNORMAL LOW (ref 36.0–46.0)
Hemoglobin: 10.6 g/dL — ABNORMAL LOW (ref 12.0–15.0)
Immature Granulocytes: 0 %
Lymphocytes Relative: 21 %
Lymphs Abs: 1.5 10*3/uL (ref 0.7–4.0)
MCH: 28.3 pg (ref 26.0–34.0)
MCHC: 31.5 g/dL (ref 30.0–36.0)
MCV: 90.1 fL (ref 80.0–100.0)
Monocytes Absolute: 0.7 10*3/uL (ref 0.1–1.0)
Monocytes Relative: 10 %
Neutro Abs: 4.7 10*3/uL (ref 1.7–7.7)
Neutrophils Relative %: 66 %
Platelets: 350 10*3/uL (ref 150–400)
RBC: 3.74 MIL/uL — ABNORMAL LOW (ref 3.87–5.11)
RDW: 14.8 % (ref 11.5–15.5)
WBC: 7.1 10*3/uL (ref 4.0–10.5)
nRBC: 0 % (ref 0.0–0.2)

## 2021-06-02 LAB — COMPREHENSIVE METABOLIC PANEL
ALT: 11 U/L (ref 0–44)
AST: 10 U/L — ABNORMAL LOW (ref 15–41)
Albumin: 2.7 g/dL — ABNORMAL LOW (ref 3.5–5.0)
Alkaline Phosphatase: 89 U/L (ref 38–126)
Anion gap: 9 (ref 5–15)
BUN: 9 mg/dL (ref 6–20)
CO2: 25 mmol/L (ref 22–32)
Calcium: 8.3 mg/dL — ABNORMAL LOW (ref 8.9–10.3)
Chloride: 104 mmol/L (ref 98–111)
Creatinine, Ser: 0.58 mg/dL (ref 0.44–1.00)
GFR, Estimated: >60 mL/min (ref >60)
Glucose, Bld: 105 mg/dL — ABNORMAL HIGH (ref 70–99)
Potassium: 3.4 mmol/L — ABNORMAL LOW (ref 3.5–5.1)
Sodium: 138 mmol/L (ref 135–145)
Total Bilirubin: 0.7 mg/dL (ref 0.3–1.2)
Total Protein: 7.2 g/dL (ref 6.5–8.1)

## 2021-06-02 LAB — URIC ACID: Uric Acid, Serum: 3.2 mg/dL (ref 2.5–7.1)

## 2021-06-02 MED ORDER — METHYLPREDNISOLONE SODIUM SUCC 125 MG IJ SOLR
125.0000 mg | Freq: Once | INTRAMUSCULAR | Status: AC
  Administered 2021-06-02: 125 mg via INTRAVENOUS
  Filled 2021-06-02: qty 2

## 2021-06-02 MED ORDER — OXYCODONE-ACETAMINOPHEN 5-325 MG PO TABS
1.0000 | ORAL_TABLET | Freq: Once | ORAL | Status: AC
  Administered 2021-06-02: 1 via ORAL
  Filled 2021-06-02: qty 1

## 2021-06-02 MED ORDER — DIAZEPAM 10 MG PO TABS: 10.0000 mg | ORAL_TABLET | Freq: Four times a day (QID) | ORAL | 0 refills | Status: DC | PRN

## 2021-06-02 MED ORDER — LORAZEPAM 1 MG PO TABS
1.0000 mg | ORAL_TABLET | Freq: Once | ORAL | Status: AC
Start: 2021-06-02 — End: 2021-06-02
  Administered 2021-06-02: 1 mg via ORAL
  Filled 2021-06-02: qty 1

## 2021-06-02 MED ORDER — FENTANYL CITRATE PF 50 MCG/ML IJ SOSY
100.0000 ug | PREFILLED_SYRINGE | INTRAMUSCULAR | Status: DC | PRN
  Administered 2021-06-02: 100 ug via INTRAVENOUS
  Filled 2021-06-02: qty 2

## 2021-06-02 MED ORDER — PREDNISONE 10 MG PO TABS: ORAL_TABLET | ORAL | 0 refills | Status: DC

## 2021-06-02 MED ORDER — SODIUM CHLORIDE 0.9 % IV SOLN: INTRAVENOUS | Status: DC

## 2021-06-02 MED ORDER — OXYCODONE-ACETAMINOPHEN 5-325 MG PO TABS
1.0000 | ORAL_TABLET | Freq: Four times a day (QID) | ORAL | 0 refills | Status: DC | PRN
Start: 2021-06-02 — End: 2021-08-21

## 2021-06-02 MED ORDER — ONDANSETRON HCL 4 MG/2ML IJ SOLN
4.0000 mg | Freq: Once | INTRAMUSCULAR | Status: AC
  Administered 2021-06-02: 4 mg via INTRAVENOUS
  Filled 2021-06-02: qty 2

## 2021-06-02 NOTE — Discharge Instructions (Signed)
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  We are treating this with prednisone, pain medicine and medicine to relax your muscles.  Try using heat on sore areas 3-4 times a day.  You could use an Ace wrap on your elbows or ankles if needed to help with the discomfort.  Also a sling on the arm can be helpful if you are having pain when you move it.  Follow-up with your PCP for checkup in 3 or 4 days if not improving.  Do not drive or drink alcohol when taking the narcotic pain reliever.

## 2021-06-02 NOTE — ED Triage Notes (Signed)
Pt BIB [facility] w/ c/o bilateral foot/ankle pain for x1 week. States she has been unable to walk since yesterday. Denies any injury.

## 2021-06-02 NOTE — ED Provider Notes (Signed)
[facility] EMERGENCY DEPARTMENT Provider Note   CSN: [id number] Arrival date & time: 06/02/21  1660     History  Chief Complaint  Patient presents with   Foot Pain    Kristine Montgomery is a 56 y.o. female.  HPI She presents for evaluation of bilateral ankle pain and right elbow pain for 1 week, not improved while taking colchicine.  This was prescribed by her cardiologist last week when she saw them for a general checkup.  She does not have a prior diagnosis of gout.  She denies trauma.  She is using over-the-counter pain medicine without relief.    Home Medications Prior to Admission medications   Medication Sig Start Date End Date Taking? Authorizing Provider  diazepam (VALIUM) 10 MG tablet Take 1 tablet (10 mg total) by mouth every 6 (six) hours as needed (Muscle spasm). 06/02/21  Yes Mancel Bale, MD  oxyCODONE-acetaminophen (PERCOCET/ROXICET) 5-325 MG tablet Take 1 tablet by mouth every 6 (six) hours as needed for severe pain or moderate pain. 06/02/21  Yes Mancel Bale, MD  predniSONE (DELTASONE) 10 MG tablet Take q day 6,5,4,3,2,1 06/02/21  Yes Mancel Bale, MD  apixaban (ELIQUIS) 5 MG TABS tablet Take 1 tablet (5 mg total) by mouth 2 (two) times daily. 03/25/21 05/21/21  Kendell Bane, MD  atorvastatin (LIPITOR) 80 MG tablet Take 1 tablet (80 mg total) by mouth daily at 6 PM. 05/21/21 06/20/21  Marinus Maw, MD  clopidogrel (PLAVIX) 75 MG tablet TAKE ONE TABLET BY MOUTH ONCE DAILY. 04/28/21   Strader, Lennart Pall, PA-C  colchicine 0.6 MG tablet Take 1 tablet (0.6 mg total) by mouth 2 (two) times daily for 10 days. 05/19/21 05/29/21  Strader, Lennart Pall, PA-C  dapagliflozin propanediol (FARXIGA) 10 MG TABS tablet Take 1 tablet (10 mg total) by mouth daily. 05/21/21   Marinus Maw, MD  dronedarone (MULTAQ) 400 MG tablet Take 1 tablet (400 mg total) by mouth 2 (two) times daily with a meal. 05/21/21   Marinus Maw, MD  famotidine (PEPCID) 40 MG tablet Take 1 tablet (40 mg total)  by mouth every evening. 05/21/21 05/21/22  Marinus Maw, MD  furosemide (LASIX) 20 MG tablet TAKE (1) TABLET BY MOUTH ONCE DAILY. TAKE AN ADDITIONAL 1 TABLET FOR 3 POUND WEIGHT GAIN OVERNIGHT OR 5 POUNDS IN A WEEK. Patient taking differently: Take 20 mg by mouth daily. Take additional tablet for 3lb weight gain overnight or 5lbs in a week 12/24/20   [place] Ouch, [place] M, PA-C  metoprolol succinate (TOPROL-XL) 25 MG 24 hr tablet Take 0.5 tablets (12.5 mg total) by mouth daily. 03/26/21 05/21/21  Shahmehdi, Gemma Payor, MD  nitroGLYCERIN (NITROSTAT) 0.4 MG SL tablet PLACE 1 TABLET (0.4 MG TOTAL) UNDER THE TONGUE EVERY FIVE MINUTES X 3 DOSES AS NEEDED FOR CHEST PAIN. Patient taking differently: Place 0.4 mg under the tongue every 5 (five) minutes as needed for chest pain. 07/31/20 07/31/21  Bhagat, Sharrell Ku, PA  sacubitril-valsartan (ENTRESTO) 24-26 MG TAKE 1 TABLET BY MOUTH TWO TIMES DAILY. 07/31/20 07/31/21  Manson Passey, PA      Allergies    Patient has no known allergies.    Review of Systems   Review of Systems  All other systems reviewed and are negative.  Physical Exam Updated Vital Signs BP 126/68    Pulse (!) 56    Resp 18    Ht 5\' 1"  (1.549 m)    Wt 105 kg    LMP 06/27/2017 (Within Weeks)  SpO2 96%    BMI 43.74 kg/m  Physical Exam Vitals and nursing note reviewed.  Constitutional:      General: She is in acute distress.     Appearance: She is well-developed. She is not ill-appearing or diaphoretic.  HENT:     Head: Normocephalic and atraumatic.     Right Ear: External ear normal.     Left Ear: External ear normal.  Eyes:     Conjunctiva/sclera: Conjunctivae normal.     Pupils: Pupils are equal, round, and reactive to light.  Neck:     Trachea: Phonation normal.  Cardiovascular:     Rate and Rhythm: Normal rate and regular rhythm.     Heart sounds: Normal heart sounds.  Pulmonary:     Effort: Pulmonary effort is normal. No respiratory distress.     Breath sounds: Normal  breath sounds. No stridor.  Abdominal:     General: There is no distension.     Palpations: Abdomen is soft.     Tenderness: There is no abdominal tenderness.  Musculoskeletal:     Cervical back: Normal range of motion and neck supple.     Comments: Right able is tender and swollen she guards against extension or flexion secondary to pain.  No significant erythema over the right elbow.  Bilateral ankles are tender but not swollen and are without erythema.  Nontender knees.  Skin:    General: Skin is warm and dry.  Neurological:     Mental Status: She is alert and oriented to person, place, and time.     Cranial Nerves: No cranial nerve deficit.     Sensory: No sensory deficit.     Motor: No abnormal muscle tone.     Coordination: Coordination normal.  Psychiatric:        Mood and Affect: Mood normal.        Behavior: Behavior normal.        Thought Content: Thought content normal.        Judgment: Judgment normal.    ED Results / Procedures / Treatments   Labs (all labs ordered are listed, but only abnormal results are displayed) Labs Reviewed  COMPREHENSIVE METABOLIC PANEL - Abnormal; Notable for the following components:      Result Value   Potassium 3.4 (*)    Glucose, Bld 105 (*)    Calcium 8.3 (*)    Albumin 2.7 (*)    AST 10 (*)    All other components within normal limits  CBC WITH DIFFERENTIAL/PLATELET - Abnormal; Notable for the following components:   RBC 3.74 (*)    Hemoglobin 10.6 (*)    HCT 33.7 (*)    All other components within normal limits  URIC ACID    EKG None  Radiology DG Elbow Complete Right  Result Date: 06/02/2021 CLINICAL DATA:  Elbow pain with difficulty extending elbow. No known injury. EXAM: RIGHT ELBOW - COMPLETE 3+ VIEW COMPARISON:  Radiographs 08/01/2018. FINDINGS: The mineralization and alignment are normal. There is no evidence of acute fracture or dislocation. There is stable spurring of the lateral humeral epicondyle and olecranon  process. On the lateral view, there is evidence of a probable elbow joint effusion. No evidence of foreign body or soft tissue emphysema. IMPRESSION: Suspected elbow joint effusion, nonspecific in etiology. No acute osseous findings. Electronically Signed   By: Carey BullocksWilliam  Veazey M.D.   On: 06/02/2021 08:15    Procedures Procedures    Medications Ordered in ED Medications  0.9 %  sodium chloride infusion ( Intravenous New Bag/Given 06/02/21 0756)  fentaNYL (SUBLIMAZE) injection 100 mcg (100 mcg Intravenous Given 06/02/21 0758)  ondansetron (ZOFRAN) injection 4 mg (4 mg Intravenous Given 06/02/21 0757)  methylPREDNISolone sodium succinate (SOLU-MEDROL) 125 mg/2 mL injection 125 mg (125 mg Intravenous Given 06/02/21 0757)  oxyCODONE-acetaminophen (PERCOCET/ROXICET) 5-325 MG per tablet 1 tablet (1 tablet Oral Given 06/02/21 0957)  LORazepam (ATIVAN) tablet 1 mg (1 mg Oral Given 06/02/21 0957)    ED Course/ Medical Decision Making/ A&P Clinical Course as of 06/02/21 1032  Tue Jun 02, 2021  1025 She is alert, more relaxed, calm and comfortable at this time.  She states she is ready to go home.  She will follow-up with her PCP if not improving.  She does not plan to continue the colchicine. [EW]    Clinical Course User Index [EW] Mancel Bale, MD                           Medical Decision Making She is presenting for evaluation of bilateral ankle and right elbow pain despite being treated for these problems with colchicine for suspected gout.  She does not have a chronic history of gout.  She denies fever, weakness, numbness or traumatic injuries.  Problems Addressed: Arthritis: acute illness or injury    Details: Patient with new arthritic condition, decreasing her ability to both walk and use right arm.  No trauma known.  Amount and/or Complexity of Data Reviewed External Data Reviewed: labs.    Details: Prior hemoglobin trend noted to be low, roughly in the range she is in today. Labs:  ordered.    Details: CBC and metabolic panel are normal, except potassium low and hemoglobin low Radiology: ordered.    Details: X-ray right elbow does not show fracture, dislocation or significant arthropathy.  Risk Prescription drug management. Decision regarding hospitalization. Risk Details: Patient was improved with treatment and does not require hospitalization.  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  Uric acid today was normal.  Doubt inflammatory or infectious arthritis.  She has gotten symptomatic improvement with the treatment that was initiated.  There is no indication for hospitalization at this time.  She can be managed at home, for this condition.           Final Clinical Impression(s) / ED Diagnoses Final diagnoses:  Arthritis    Rx / DC Orders ED Discharge Orders          Ordered    oxyCODONE-acetaminophen (PERCOCET/ROXICET) 5-325 MG tablet  Every 6 hours PRN        06/02/21 1031    diazepam (VALIUM) 10 MG tablet  Every 6 hours PRN        06/02/21 1031    predniSONE (DELTASONE) 10 MG tablet        06/02/21 1031              Mancel Bale, MD 06/02/21 1955

## 2021-06-10 IMAGING — DX DG CHEST 2V
2 series · 2 of 2 positions shown · non-contrast
Comparison: 02/20/2018.

CLINICAL DATA: Mid chest pain.

EXAM:
CHEST - 2 VIEW

[chest lat]
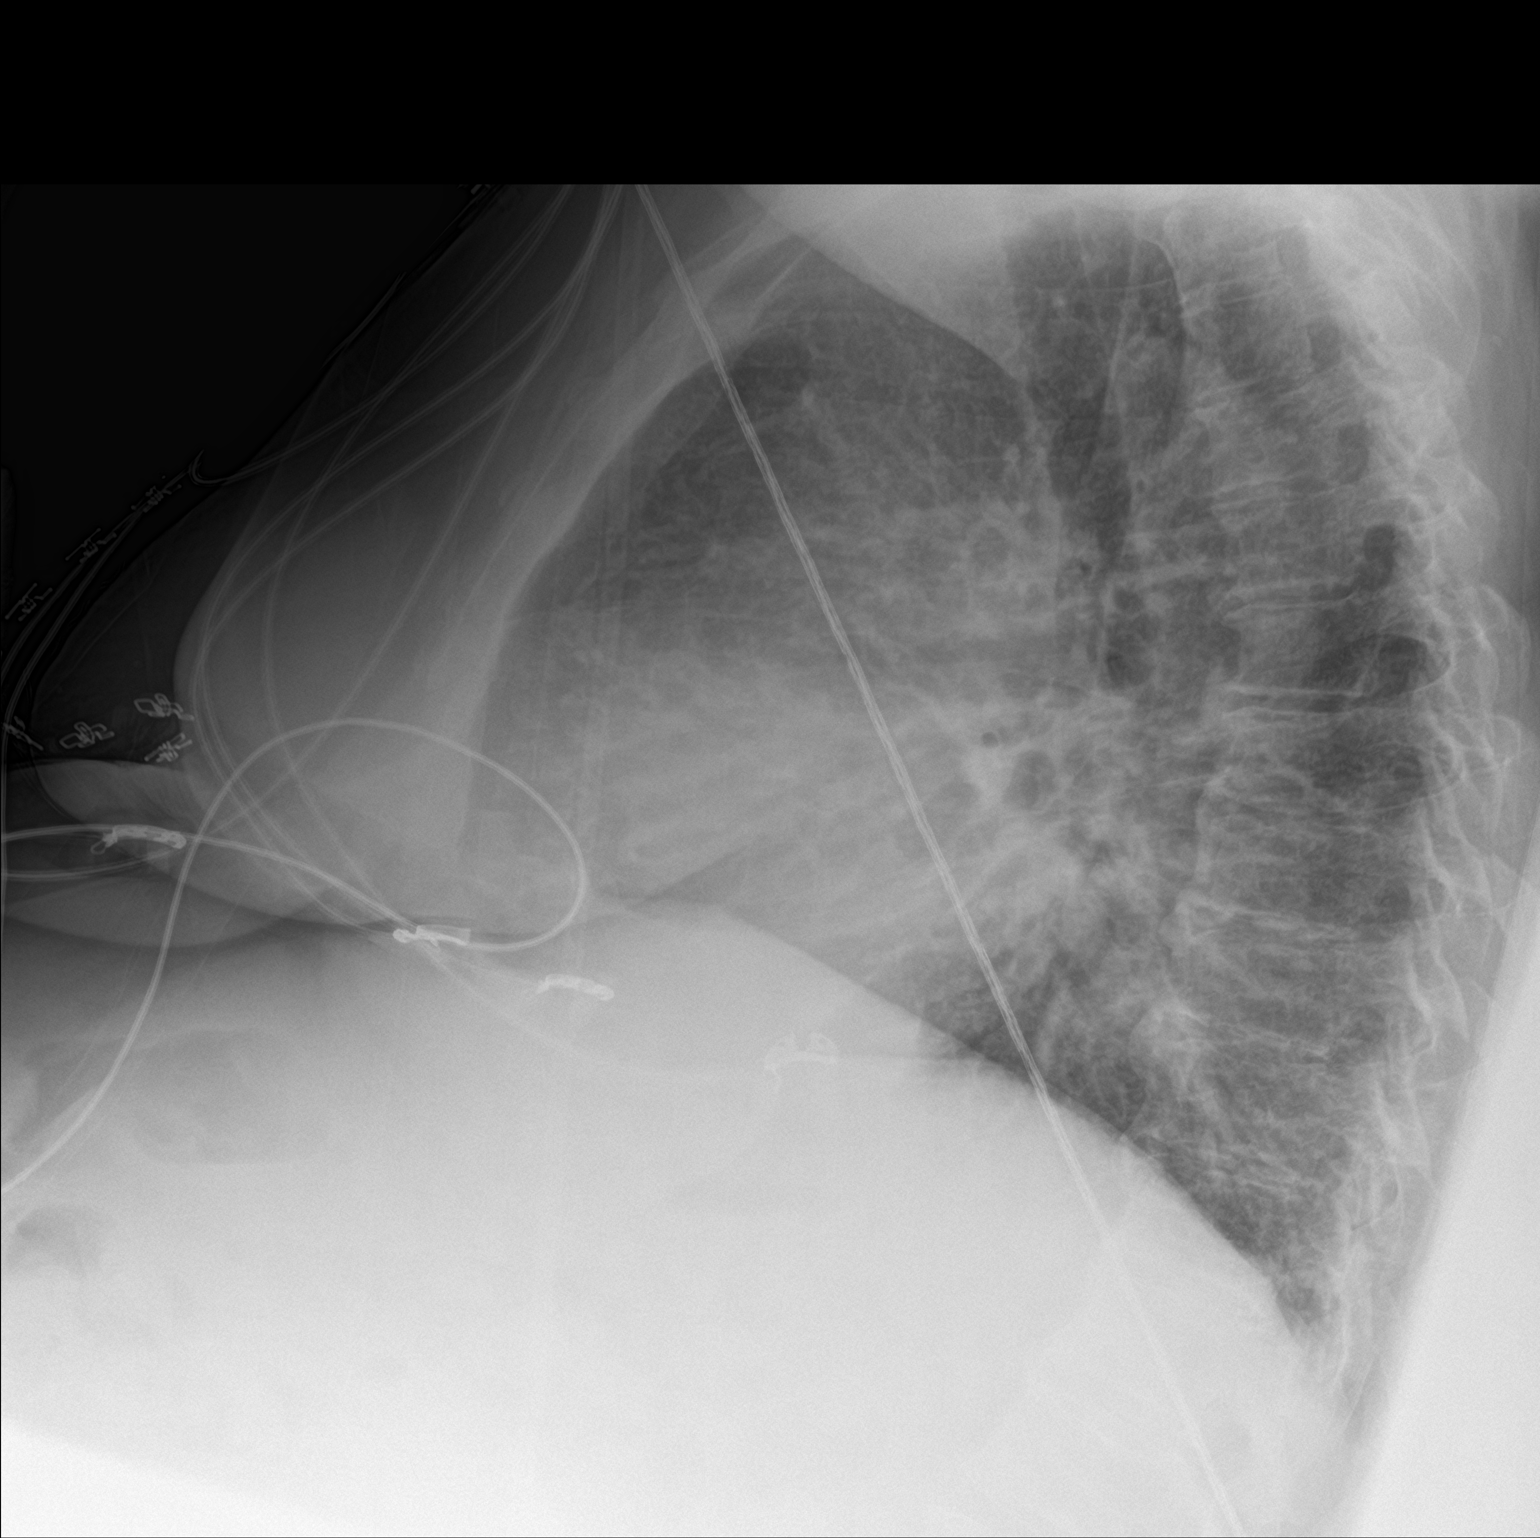

[chest ap]
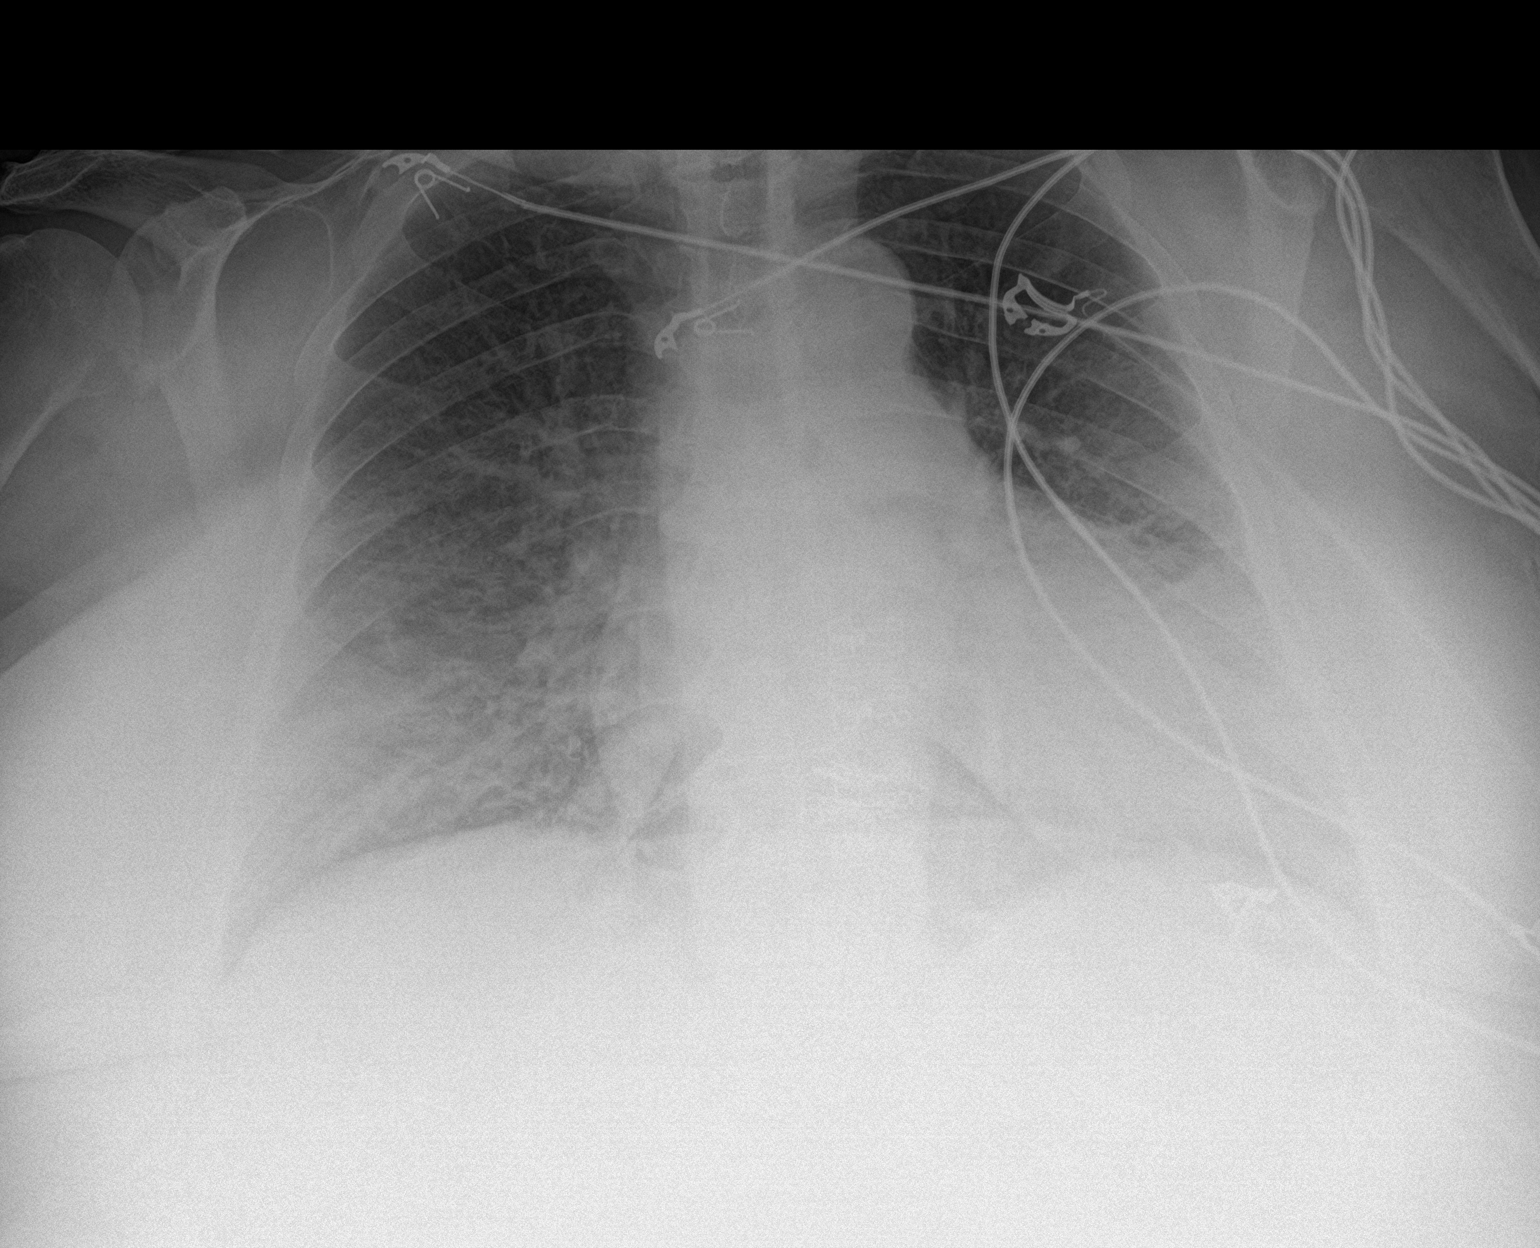

[2 of 2 positions shown; findings below may reference images not displayed]

FINDINGS: Trachea is midline. Heart is enlarged. Mild interstitial prominence
and indistinctness, mid and lower lung zone predominant. No pleural
fluid. Flowing anterior osteophytosis in the thoracic spine.
IMPRESSION: Pulmonary edema.

## 2021-06-22 ENCOUNTER — Telehealth: Payer: Self-pay | Admitting: Internal Medicine

## 2021-06-22 MED ORDER — METOPROLOL SUCCINATE ER 25 MG PO TB24: 12.5000 mg | ORAL_TABLET | Freq: Every day | ORAL | 3 refills | Status: DC

## 2021-06-22 NOTE — Telephone Encounter (Signed)
°*  STAT* If patient is at the pharmacy, call can be transferred to refill team.   1. Which medications need to be refilled? (please list name of each medication and dose if known) metoprolol succinate (TOPROL-XL) 25 MG 24 hr tablet (Expired)  2. Which pharmacy/location (including street and city if local pharmacy) is medication to be sent to? [place]  3. Do they need a 30 day or 90 day supply? [place]

## 2021-06-22 NOTE — Telephone Encounter (Signed)
Complete

## 2021-07-07 ENCOUNTER — Telehealth: Payer: Self-pay | Admitting: Internal Medicine

## 2021-07-07 NOTE — Telephone Encounter (Signed)
Pt c/o medication issue:  1. Name of Medication:  Hydrocodone  2. How are you currently taking this medication (dosage and times per day)?   3. Are you having a reaction (difficulty breathing--STAT)?   4. What is your medication issue?   Patient states she has been having pain in her feet and elbows and her PCP prescribed Hydrocodone. She would like to confirm that this will not interfere with any cardiac medications prior to taking it. Please advise.

## 2021-07-07 NOTE — Telephone Encounter (Signed)
Patient has taken oxycodone without issue. I encouraged her to speak with pharmacist regarding using hydrocodone. She states she no longer takes valium.

## 2021-07-13 ENCOUNTER — Other Ambulatory Visit: Payer: Self-pay

## 2021-07-13 ENCOUNTER — Emergency Department (HOSPITAL_COMMUNITY)
Admission: EM | Admit: 2021-07-13 | Discharge: 2021-07-13 | Disposition: A | Discharge status: Home / Self Care | Payer: Medicaid Other | Attending: Emergency Medicine | Admitting: Emergency Medicine

## 2021-07-13 ENCOUNTER — Emergency Department (HOSPITAL_COMMUNITY): Payer: Medicaid Other

## 2021-07-13 ENCOUNTER — Encounter (HOSPITAL_COMMUNITY): Payer: Self-pay

## 2021-07-13 DIAGNOSIS — R0789 Other chest pain: Secondary | ICD-10-CM | POA: Diagnosis not present

## 2021-07-13 DIAGNOSIS — Z7901 Long term (current) use of anticoagulants: Secondary | ICD-10-CM | POA: Diagnosis not present

## 2021-07-13 DIAGNOSIS — Z7902 Long term (current) use of antithrombotics/antiplatelets: Secondary | ICD-10-CM | POA: Diagnosis not present

## 2021-07-13 DIAGNOSIS — R079 Chest pain, unspecified: Secondary | ICD-10-CM

## 2021-07-13 LAB — TROPONIN I (HIGH SENSITIVITY)
Troponin I (High Sensitivity): 10 ng/L (ref <18)
Troponin I (High Sensitivity): 10 ng/L (ref <18)

## 2021-07-13 LAB — CBC
HCT: 39.5 % (ref 36.0–46.0)
Hemoglobin: 12.2 g/dL (ref 12.0–15.0)
MCH: 28.2 pg (ref 26.0–34.0)
MCHC: 30.9 g/dL (ref 30.0–36.0)
MCV: 91.2 fL (ref 80.0–100.0)
Platelets: 343 10*3/uL (ref 150–400)
RBC: 4.33 MIL/uL (ref 3.87–5.11)
RDW: 15.2 % (ref 11.5–15.5)
WBC: 10.2 10*3/uL (ref 4.0–10.5)
nRBC: 0 % (ref 0.0–0.2)

## 2021-07-13 LAB — BASIC METABOLIC PANEL
Anion gap: 7 (ref 5–15)
BUN: 19 mg/dL (ref 6–20)
CO2: 28 mmol/L (ref 22–32)
Calcium: 8.6 mg/dL — ABNORMAL LOW (ref 8.9–10.3)
Chloride: 106 mmol/L (ref 98–111)
Creatinine, Ser: 0.83 mg/dL (ref 0.44–1.00)
GFR, Estimated: >60 mL/min (ref >60)
Glucose, Bld: 141 mg/dL — ABNORMAL HIGH (ref 70–99)
Potassium: 4.2 mmol/L (ref 3.5–5.1)
Sodium: 141 mmol/L (ref 135–145)

## 2021-07-13 NOTE — ED Provider Triage Note (Signed)
Emergency Medicine Provider Triage Evaluation Note  Kristine Montgomery , a 56 y.o. female  was evaluated in triage.  Pt with a past medical history of MI stent placement, A-fib presenting today with 1 day worth of left-sided chest discomfort.  She says that it is worse when she is twisting and pushing on the lateral left breast.  Denies any shortness of breath, palpitations, dizziness  Review of Systems  As above Physical Exam  BP 124/71 (BP Location: Right Arm)    Pulse 65    Temp 97.6 F (36.4 C) (Oral)    Resp 20    Ht 5\' 1"  (1.549 m)    Wt 103.4 kg    LMP 06/27/2017 (Within Weeks)    SpO2 96%    BMI 43.08 kg/m  Gen:   Awake, no distress   Resp:  Normal effort  MSK:   Moves extremities without difficulty  Other:  Reproducible chest pain on palpation of the lateral rib cage.  Worse with twisting to the left.  Tenderness to trapezius  Medical Decision Making  Medically screening exam initiated at 3:16 PM.  Appropriate orders placed.  YARETZI ERNANDEZ was informed that the remainder of the evaluation will be completed by another provider, this initial triage assessment does not replace that evaluation, and the importance of remaining in the ED until their evaluation is complete.     Rudell Cobb, PA-C 07/13/21 1517

## 2021-07-13 NOTE — ED Provider Notes (Signed)
[facility] EMERGENCY DEPARTMENT Provider Note   CSN: [id number] Arrival date & time: 07/13/21  1331     History  Chief Complaint  Patient presents with   Chest Pain    Kristine Montgomery is a 56 y.o. female.  Patient presents ER chief complaint of left-sided chest pain.  Describes as a muscle achy type pain in the left lateral chest region.  Worse when she turns her neck and moves her chest a certain way.  Symptoms started last night and persisted through the night and persisted today so she presents to the ER.  Otherwise denies any trouble breathing or diaphoresis.  Denies any fevers cough vomiting or diarrhea.      Home Medications Prior to Admission medications   Medication Sig Start Date End Date Taking? Authorizing Provider  apixaban (ELIQUIS) 5 MG TABS tablet Take 1 tablet (5 mg total) by mouth 2 (two) times daily. 03/25/21 05/21/21  Deatra James, MD  atorvastatin (LIPITOR) 80 MG tablet Take 1 tablet (80 mg total) by mouth daily at 6 PM. 05/21/21 06/20/21  Evans Lance, MD  clopidogrel (PLAVIX) 75 MG tablet TAKE ONE TABLET BY MOUTH ONCE DAILY. 04/28/21   Strader, Fransisco Hertz, PA-C  colchicine 0.6 MG tablet Take 1 tablet (0.6 mg total) by mouth 2 (two) times daily for 10 days. 05/19/21 05/29/21  Strader, Fransisco Hertz, PA-C  dapagliflozin propanediol (FARXIGA) 10 MG TABS tablet Take 1 tablet (10 mg total) by mouth daily. 05/21/21   Evans Lance, MD  diazepam (VALIUM) 10 MG tablet Take 1 tablet (10 mg total) by mouth every 6 (six) hours as needed (Muscle spasm). 06/02/21   Daleen Bo, MD  dronedarone (MULTAQ) 400 MG tablet Take 1 tablet (400 mg total) by mouth 2 (two) times daily with a meal. 05/21/21   Evans Lance, MD  famotidine (PEPCID) 40 MG tablet Take 1 tablet (40 mg total) by mouth every evening. 05/21/21 05/21/22  Evans Lance, MD  furosemide (LASIX) 20 MG tablet TAKE (1) TABLET BY MOUTH ONCE DAILY. TAKE AN ADDITIONAL 1 TABLET FOR 3 POUND WEIGHT GAIN OVERNIGHT OR 5 POUNDS IN  A WEEK. Patient taking differently: Take 20 mg by mouth daily. Take additional tablet for 3lb weight gain overnight or 5lbs in a week 12/24/20   Ahmed Prima, [place] M, PA-C  metoprolol succinate (TOPROL-XL) 25 MG 24 hr tablet Take 0.5 tablets (12.5 mg total) by mouth daily. 06/22/21 07/22/21  Fay Records, MD  nitroGLYCERIN (NITROSTAT) 0.4 MG SL tablet PLACE 1 TABLET (0.4 MG TOTAL) UNDER THE TONGUE EVERY FIVE MINUTES X 3 DOSES AS NEEDED FOR CHEST PAIN. Patient taking differently: Place 0.4 mg under the tongue every 5 (five) minutes as needed for chest pain. 07/31/20 07/31/21  Leanor Kail, PA  oxyCODONE-acetaminophen (PERCOCET/ROXICET) 5-325 MG tablet Take 1 tablet by mouth every 6 (six) hours as needed for severe pain or moderate pain. 06/02/21   Daleen Bo, MD  predniSONE (DELTASONE) 10 MG tablet Take q day 6,5,4,3,2,1 06/02/21   Daleen Bo, MD  sacubitril-valsartan (ENTRESTO) 24-26 MG TAKE 1 TABLET BY MOUTH TWO TIMES DAILY. 07/31/20 07/31/21  Leanor Kail, PA      Allergies    Patient has no known allergies.    Review of Systems   Review of Systems  Constitutional:  Negative for fever.  HENT:  Negative for ear pain.   Eyes:  Negative for pain.  Respiratory:  Negative for cough.   Cardiovascular:  Positive for chest pain.  Gastrointestinal:  Negative for abdominal pain.  Genitourinary:  Negative for flank pain.  Musculoskeletal:  Negative for back pain.  Skin:  Negative for rash.  Neurological:  Negative for headaches.   Physical Exam Updated Vital Signs BP 130/76    Pulse (!) 52    Temp 97.7 F (36.5 C) (Oral)    Resp 12    Ht 5\' 1"  (1.549 m)    Wt 103.4 kg    LMP 06/27/2017 (Within Weeks)    SpO2 97%    BMI 43.08 kg/m  Physical Exam Constitutional:      General: She is not in acute distress.    Appearance: Normal appearance.  HENT:     Head: Normocephalic.     Nose: Nose normal.  Eyes:     Extraocular Movements: Extraocular movements intact.  Cardiovascular:      Rate and Rhythm: Normal rate.  Pulmonary:     Effort: Pulmonary effort is normal.  Musculoskeletal:        General: Normal range of motion.     Cervical back: Normal range of motion.  Neurological:     General: No focal deficit present.     Mental Status: She is alert. Mental status is at baseline.    ED Results / Procedures / Treatments   Labs (all labs ordered are listed, but only abnormal results are displayed) Labs Reviewed  BASIC METABOLIC PANEL - Abnormal; Notable for the following components:      Result Value   Glucose, Bld 141 (*)    Calcium 8.6 (*)    All other components within normal limits  CBC  TROPONIN I (HIGH SENSITIVITY)  TROPONIN I (HIGH SENSITIVITY)    EKG EKG Interpretation  Date/Time:  Monday July 13 2021 13:49:18 EST Ventricular Rate:  67 PR Interval:  142 QRS Duration: 78 QT Interval:  436 QTC Calculation: 460 R Axis:   76 Text Interpretation: Normal sinus rhythm Normal ECG When compared with ECG of 23-Mar-2021 03:04, PREVIOUS ECG IS PRESENT Confirmed by Thamas Jaegers ([id number]) on 07/13/2021 3:04:24 PM  Radiology DG Chest 2 View  Result Date: 07/13/2021 CLINICAL DATA:  Chest pain. EXAM: CHEST - 2 VIEW COMPARISON:  March 23, 2021. FINDINGS: The heart size and mediastinal contours are within normal limits. Both lungs are clear. The visualized skeletal structures are unremarkable. IMPRESSION: No active cardiopulmonary disease. Electronically Signed   By: Marijo Conception M.D.   On: 07/13/2021 14:27    Procedures Procedures    Medications Ordered in ED Medications - No data to display  ED Course/ Medical Decision Making/ A&P                           Medical Decision Making Amount and/or Complexity of Data Reviewed Labs: ordered. Radiology: ordered.   Patient monitored on telemetry with no adverse events normal rate sinus rhythm noted.  Chart review shows telephone visits for medication refills with her cardiologist July 10, 2021.  Work-up is unremarkable today EKG shows sinus rhythm no ST elevation depressions normal rate.  Troponins are flat x2.  Chest x-ray is unremarkable.  Clinically appears very atypical given is reproducible with certain motions of her chest.  Will advise close outpatient follow-up with her cardiologist this week.  Advised patient to call make an appointment tomorrow.  Advising avoidance of strenuous activity until cleared by her cardiologist.  Advising immediate return for worsening symptoms or any additional concerns.  Final Clinical Impression(s) / ED Diagnoses Final diagnoses:  Nonspecific chest pain    Rx / DC Orders ED Discharge Orders     None         Luna Fuse, MD 07/13/21 [phone]

## 2021-07-13 NOTE — ED Triage Notes (Signed)
Pt presents to ED with complaints of left sided chest pain radiating to shoulder blades started yesterday evening.

## 2021-07-13 NOTE — Discharge Instructions (Addendum)
Call your primary care doctor or specialist as discussed in the next 2-3 days.    Avoid strenuous activity until cleared by your cardiologist.  Return immediately back to the ER if:  Your symptoms worsen within the next 12-24 hours. You develop new symptoms such as new fevers, persistent vomiting, new pain, shortness of breath, or new weakness or numbness, or if you have any other concerns.  

## 2021-07-21 ENCOUNTER — Other Ambulatory Visit: Payer: Self-pay | Admitting: Physician Assistant

## 2021-07-22 ENCOUNTER — Other Ambulatory Visit: Payer: Self-pay | Admitting: Student

## 2021-07-22 NOTE — Telephone Encounter (Signed)
Prescription refill request for Eliquis received. ?Indication: PAF ?Last office visit: 05/19/21  B Strader PA-C ?Scr: 0.83 on 07/13/21 ?Age: 56 ?Weight: 105.3kg ? ?Based on above findings Eliquis 5mg  twice daily is the appropriate dose.  Refill approved. ? ?

## 2021-07-30 ENCOUNTER — Ambulatory Visit: Payer: Medicaid Other | Admitting: Orthopedic Surgery

## 2021-08-06 ENCOUNTER — Ambulatory Visit: Payer: Medicaid Other | Admitting: Orthopedic Surgery

## 2021-08-21 ENCOUNTER — Ambulatory Visit (INDEPENDENT_AMBULATORY_CARE_PROVIDER_SITE_OTHER): Payer: 59 | Admitting: Internal Medicine

## 2021-08-21 ENCOUNTER — Other Ambulatory Visit (HOSPITAL_COMMUNITY)
Admission: RE | Admit: 2021-08-21 | Discharge: 2021-08-21 | Disposition: A | Discharge status: Home / Self Care | Payer: Medicaid Other | Source: Ambulatory Visit | Attending: Internal Medicine | Admitting: Internal Medicine

## 2021-08-21 ENCOUNTER — Encounter: Payer: Self-pay | Admitting: Internal Medicine

## 2021-08-21 VITALS — BP 102/60 | HR 67 | Ht 61.0 in | Wt 231.0 lb

## 2021-08-21 DIAGNOSIS — E782 Mixed hyperlipidemia: Secondary | ICD-10-CM | POA: Diagnosis present

## 2021-08-21 DIAGNOSIS — R0989 Other specified symptoms and signs involving the circulatory and respiratory systems: Secondary | ICD-10-CM

## 2021-08-21 NOTE — Patient Instructions (Signed)
Medication Instructions:  ?Your physician recommends that you continue on your current medications as directed. Please refer to the Current Medication list given to you today. ? ?*If you need a refill on your cardiac medications before your next appointment, please call your pharmacy* ? ? ?Lab Work: ?Your physician recommends that you return for lab work in: Today  ? ?If you have labs (blood work) drawn today and your tests are completely normal, you will receive your results only by: ?MyChart Message (if you have MyChart) OR ?A paper copy in the mail ?If you have any lab test that is abnormal or we need to change your treatment, we will call you to review the results. ? ? ?Testing/Procedures: ?Your physician has requested that you have a carotid duplex. This test is an ultrasound of the carotid arteries in your neck. It looks at blood flow through these arteries that supply the brain with blood. Allow one hour for this exam. There are no restrictions or special instructions. ? ? ? ?Follow-Up: ?At [facility], you and your health needs are our priority.  As part of our continuing mission to provide you with exceptional heart care, we have created designated Provider Care Teams.  These Care Teams include your primary Cardiologist (physician) and Advanced Practice Providers (APPs -  Physician Assistants and Nurse Practitioners) who all work together to provide you with the care you need, when you need it. ? ?We recommend signing up for the patient portal called "MyChart".  Sign up information is provided on this After Visit Summary.  MyChart is used to connect with patients for Virtual Visits (Telemedicine).  Patients are able to view lab/test results, encounter notes, upcoming appointments, etc.  Non-urgent messages can be sent to your provider as well.   ?To learn more about what you can do with MyChart, go to [email or web address].   ? ?Your next appointment:   ? October  ? ?The format for your next  appointment:   ?In Person ? ?Provider:   ?Dietrich Pates, MD  ? ? ?Other Instructions ?Thank you for choosing [facility]! ? ? ? ?

## 2021-08-21 NOTE — Progress Notes (Signed)
? ?Cardiology Office Note ? ? ?Date:  08/21/2021  ? ?ID:  Kristine Montgomery, DOB 04-Aug-1965, MRN [id number] ? ?PCP:  Elfredia NevinsFusco, Lawrence, MD  ?Cardiologist:   Dietrich PatesPaula Jaicob Dia, MD  ? ?Pateint presents for f/u of CAD  ? ?  ?History of Present Illness: ?Kristine Montgomery is a 56 y.o. 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 ?  ?Pt was seen in ED for chest  pain  in Feb    Appeared muscular  ? ?The pt says her breathing is good   No CP    Notes occasional palpitations.  SHort lived   ? ?Diet   Quit sodas   Cut back on red meat and pork    Likes fish and  ?Chicken.  SOme veggies   ? ? ? ?Current Meds  ?Medication Sig  ? apixaban (ELIQUIS) 5 MG TABS tablet TAKE (1) TABLET BY MOUTH TWICE DAILY.  ? aspirin EC 81 MG tablet Take 81 mg by mouth daily. Swallow whole.  ? atorvastatin (LIPITOR) 80 MG tablet Take 1 tablet (80 mg total) by mouth daily at 6 PM.  ? dapagliflozin propanediol (FARXIGA) 10 MG TABS tablet Take 1 tablet (10 mg total) by mouth daily.  ? diazepam (VALIUM) 10 MG tablet Take 1 tablet (10 mg total) by mouth every 6 (six) hours as needed (Muscle spasm).  ? dronedarone (MULTAQ) 400 MG tablet Take 1 tablet (400 mg total) by mouth 2 (two) times daily with a meal.  ? ENTRESTO 24-26 MG TAKE 1 TABLET BY MOUTH TWICE A DAY  ? famotidine (PEPCID) 40 MG tablet Take 1 tablet (40 mg total) by mouth every evening.  ? furosemide (LASIX) 20 MG tablet TAKE (1) TABLET BY MOUTH ONCE DAILY. TAKE AN ADDITIONAL 1 TABLET FOR 3 POUND WEIGHT GAIN OVERNIGHT OR 5 POUNDS IN A WEEK. (Patient taking differently: Take 20 mg by mouth daily. Take additional tablet for 3lb weight gain overnight or 5lbs in a week)  ? HYDROcodone-acetaminophen (NORCO/VICODIN) 5-325 MG tablet Take 1-2 tablets by mouth 4 (four) times daily as needed.  ?  metoprolol succinate (TOPROL-XL) 25 MG 24 hr tablet Take 0.5 tablets (12.5 mg total) by mouth daily.  ? nitroGLYCERIN (NITROSTAT) 0.4 MG SL tablet PLACE 1 TABLET (0.4 MG TOTAL) UNDER THE TONGUE EVERY FIVE MINUTES X 3 DOSES AS NEEDED FOR CHEST PAIN. (Patient taking differently: Place 0.4 mg under the tongue every 5 (five) minutes as needed for chest pain.)  ? ? ? ?Allergies:   Patient has no known allergies.  ? ?Past Medical History:  ?Diagnosis Date  ? Acute combined systolic and diastolic CHF, NYHA class 4 (HCC) 07/29/2020  ? a. EF 30% by echo in 07/2020 b. EF 55-60% by echo in 11/2020  ? Arthritis   ? Asthma   ? CAD (coronary artery disease)   ? a. 07/2020: s/p NSTEMI with DES to proxLAD and DES to proxLCx with medical management recommended of the occluded RCA.  ? Chronic bronchitis (HCC)   ? COPD (chronic obstructive pulmonary disease) (HCC)   ? Edema extremities   ? History of hiatal hernia   ? Hyperlipidemia LDL goal <70 07/29/2020  ? Ovarian cyst   ? Pneumonia   ? ? ?Past Surgical History:  ?Procedure Laterality  Date  ? CESAREAN SECTION    ? CORONARY STENT INTERVENTION N/A 07/29/2020  ? Procedure: CORONARY STENT INTERVENTION;  Surgeon: Lennette Bihari, MD;  Location: [facility] INVASIVE CV LAB;  Service: Cardiovascular;  Laterality: N/A;  ? KNEE ARTHROSCOPY WITH MEDIAL MENISECTOMY Right 05/23/2020  ? Procedure: KNEE ARTHROSCOPY WITH MEDIAL AND LATERAL MENISECTOMY; 3 COMPARTMENT SYNOVECTOMY;  Surgeon: Vickki Hearing, MD;  Location: [facility] ORS;  Service: Orthopedics;  Laterality: Right;  ? RIGHT/LEFT HEART CATH AND CORONARY ANGIOGRAPHY N/A 07/29/2020  ? Procedure: RIGHT/LEFT HEART CATH AND CORONARY ANGIOGRAPHY;  Surgeon: Lennette Bihari, MD;  Location: [facility] INVASIVE CV LAB;  Service: Cardiovascular;  Laterality: N/A;  ? TONSILLECTOMY    ? TUBAL LIGATION    ? ? ? ?Social History:  The patient  reports that she has quit smoking. Her smoking use included cigarettes. She has a 15.00 pack-year smoking history. She has never used  smokeless tobacco. She reports that she does not currently use alcohol. She reports that she does not use drugs.  ? ?Family History:  The patient's family history includes COPD in her father; Cancer in her maternal grandmother, mother, and paternal grandfather; Congestive Heart Failure in her mother; Diabetes in her mother; Hypertension in her sister; Non-Hodgkin's lymphoma in her mother; Other in her son.  ? ? ?ROS:  Please see the history of present illness. All other systems are reviewed and  Negative to the above problem except as noted.  ? ? ?PHYSICAL EXAM: ?VS:  BP 102/60   Pulse 67   Ht 5\' 1"  (1.549 m)   Wt 231 lb (104.8 kg)   LMP 06/27/2017 (Within Weeks)   SpO2 97%   BMI 43.65 kg/m?   ?GEN: Morbidly obese in no acute distress  ?HEENT: normal  ?Neck: no JVD, carotid bruits ?Cardiac: RRR; no murmurs.  No LE  edema  ?Respiratory:  clear to auscultation bilaterally,  ?GI: soft, nontender, nondistended, + BS  No hepatomegaly  ?MS: no deformity Moving all extremities   ?Skin: warm and dry, no rash ?Neuro:  Strength and sensation are intact ?Psych: euthymic mood, full affect ? ? ?EKG:  EKG is not ordered today. ? ? ?Lipid Panel ?   ?Component Value Date/Time  ? CHOL 211 (H) 07/29/2020 0236  ? TRIG 186 (H) 07/29/2020 0236  ? HDL 22 (L) 07/29/2020 0236  ? CHOLHDL 9.6 07/29/2020 0236  ? VLDL 37 07/29/2020 0236  ? LDLCALC 152 (H) 07/29/2020 0236  ? ?  ? ?Wt Readings from Last 3 Encounters:  ?08/21/21 231 lb (104.8 kg)  ?07/13/21 228 lb (103.4 kg)  ?06/02/21 231 lb 7.7 oz (105 kg)  ?  ? ? ?ASSESSMENT AND PLAN: ? ?1  CAD   No symptoms of angina   ? ?2  HL  Will check lipids today   Was over 1 year ago at last check ? ?3  Hx of HFrEF    Echo in July 2022 was 55 to 60%  Continue entresto   Volume status good ? ?4  PAF   Pt maintaining SR on Multaq.  Keep on Eliquis     ? ?5  CV dz   With such severe CAD will set up for carotid USN  ? ?Plan for follow up next fall   ? ? ?Current medicines are reviewed at length with  the patient today.  The patient does not have concerns regarding medicines. ? ?Signed, ?August 2022, MD  ?08/21/2021 1:12 PM    ?[facility] ?[address], [place], [place]  [address] ?Phone: [phone]; Fax: [phone]  ? ? ?

## 2021-08-23 LAB — NMR, LIPOPROFILE
Cholesterol, Total: 139 mg/dL (ref 100–199)
HDL Cholesterol by NMR: 49 mg/dL (ref >39)
HDL Particle Number: 26.4 umol/L — ABNORMAL LOW (ref >=30.5)
LDL Particle Number: 765 nmol/L (ref <1000)
LDL Size: 20.9 nm (ref >20.5)
LDL-C (NIH Calc): 68 mg/dL (ref 0–99)
LP-IR Score: 33 (ref <=45)
Small LDL Particle Number: 435 nmol/L (ref <=527)
Triglycerides by NMR: 126 mg/dL (ref 0–149)

## 2021-08-24 ENCOUNTER — Telehealth: Payer: Self-pay | Admitting: Internal Medicine

## 2021-08-24 NOTE — Telephone Encounter (Signed)
Spoke to patient regarding labs, reiterated previous call message that lipids were great and Dr Tenny Craw was not making any changes at this time. ?

## 2021-08-24 NOTE — Telephone Encounter (Signed)
Pt returning call regarding test results. Please advise ?

## 2021-09-02 ENCOUNTER — Telehealth: Payer: Self-pay | Admitting: Internal Medicine

## 2021-09-02 ENCOUNTER — Ambulatory Visit (HOSPITAL_COMMUNITY)
Admission: RE | Admit: 2021-09-02 | Discharge: 2021-09-02 | Disposition: A | Discharge status: Home / Self Care | Payer: Medicaid Other | Source: Ambulatory Visit | Attending: Internal Medicine | Admitting: Internal Medicine

## 2021-09-02 ENCOUNTER — Encounter: Payer: Self-pay | Admitting: Internal Medicine

## 2021-09-02 DIAGNOSIS — R0989 Other specified symptoms and signs involving the circulatory and respiratory systems: Secondary | ICD-10-CM | POA: Insufficient documentation

## 2021-09-02 NOTE — Telephone Encounter (Signed)
Pt states that she would like for someone to call her back to explain test result. Please advise ?

## 2021-09-02 NOTE — Telephone Encounter (Signed)
Pt aware that test has not been resulted by provider. Pt will wait until results are ready and aware that nursing staff will call and give them to her.  ?

## 2021-09-12 ENCOUNTER — Emergency Department (HOSPITAL_COMMUNITY)
Admission: EM | Admit: 2021-09-12 | Discharge: 2021-09-12 | Disposition: A | Discharge status: Home / Self Care | Payer: Medicaid Other | Attending: Emergency Medicine | Admitting: Emergency Medicine

## 2021-09-12 ENCOUNTER — Emergency Department (HOSPITAL_COMMUNITY): Payer: Medicaid Other

## 2021-09-12 ENCOUNTER — Other Ambulatory Visit: Payer: Self-pay

## 2021-09-12 ENCOUNTER — Encounter (HOSPITAL_COMMUNITY): Payer: Self-pay | Admitting: Emergency Medicine

## 2021-09-12 DIAGNOSIS — Z79899 Other long term (current) drug therapy: Secondary | ICD-10-CM | POA: Insufficient documentation

## 2021-09-12 DIAGNOSIS — R0789 Other chest pain: Secondary | ICD-10-CM | POA: Diagnosis not present

## 2021-09-12 DIAGNOSIS — Z7901 Long term (current) use of anticoagulants: Secondary | ICD-10-CM | POA: Insufficient documentation

## 2021-09-12 DIAGNOSIS — Z7982 Long term (current) use of aspirin: Secondary | ICD-10-CM | POA: Diagnosis not present

## 2021-09-12 DIAGNOSIS — R079 Chest pain, unspecified: Secondary | ICD-10-CM | POA: Diagnosis present

## 2021-09-12 LAB — BASIC METABOLIC PANEL
Anion gap: 7 (ref 5–15)
BUN: 22 mg/dL — ABNORMAL HIGH (ref 6–20)
CO2: 25 mmol/L (ref 22–32)
Calcium: 8.6 mg/dL — ABNORMAL LOW (ref 8.9–10.3)
Chloride: 107 mmol/L (ref 98–111)
Creatinine, Ser: 0.9 mg/dL (ref 0.44–1.00)
GFR, Estimated: >60 mL/min (ref >60)
Glucose, Bld: 98 mg/dL (ref 70–99)
Potassium: 3.9 mmol/L (ref 3.5–5.1)
Sodium: 139 mmol/L (ref 135–145)

## 2021-09-12 LAB — CBC
HCT: 34.7 % — ABNORMAL LOW (ref 36.0–46.0)
Hemoglobin: 11.1 g/dL — ABNORMAL LOW (ref 12.0–15.0)
MCH: 28.4 pg (ref 26.0–34.0)
MCHC: 32 g/dL (ref 30.0–36.0)
MCV: 88.7 fL (ref 80.0–100.0)
Platelets: 271 10*3/uL (ref 150–400)
RBC: 3.91 MIL/uL (ref 3.87–5.11)
RDW: 15.9 % — ABNORMAL HIGH (ref 11.5–15.5)
WBC: 9.7 10*3/uL (ref 4.0–10.5)
nRBC: 0 % (ref 0.0–0.2)

## 2021-09-12 LAB — TROPONIN I (HIGH SENSITIVITY)
Troponin I (High Sensitivity): 11 ng/L (ref <18)
Troponin I (High Sensitivity): 10 ng/L (ref <18)

## 2021-09-12 NOTE — ED Triage Notes (Signed)
Pt states she was sitting in her chair when she developed L sided chest pain that she describes as a dull ache. ?

## 2021-09-12 NOTE — ED Notes (Signed)
X-ray at bedside

## 2021-09-12 NOTE — ED Provider Notes (Signed)
?[facility] ?Provider Note ? ? ?CSN: [id number] ?Arrival date & time: 09/12/21  0159 ? ?  ? ?History ? ?Chief Complaint  ?Patient presents with  ? Chest Pain  ? ? ?Kristine Montgomery is a 56 y.o. female. ? ?Patient presents to the emergency department with left upper chest pain.  Patient reports that she was sitting in her chair for a period of time, slumped over to the side while playing again.  When she sat up straight she felt the pain in the left side of her chest and the pain has been there ever since.  The area is tender to the touch and it hurts when she bends her torso.  She became nervous tonight, however, after thinking about it for a while and "wanted to get checked out". ? ? ?  ? ?Home Medications ?Prior to Admission medications   ?Medication Sig Start Date End Date Taking? Authorizing Provider  ?apixaban (ELIQUIS) 5 MG TABS tablet TAKE (1) TABLET BY MOUTH TWICE DAILY. 07/22/21   Fay Records, MD  ?aspirin EC 81 MG tablet Take 81 mg by mouth daily. Swallow whole.    [provider]  ?atorvastatin (LIPITOR) 80 MG tablet Take 1 tablet (80 mg total) by mouth daily at 6 PM. 05/21/21 08/21/21  Evans Lance, MD  ?dapagliflozin propanediol (FARXIGA) 10 MG TABS tablet Take 1 tablet (10 mg total) by mouth daily. 05/21/21   Evans Lance, MD  ?diazepam (VALIUM) 10 MG tablet Take 1 tablet (10 mg total) by mouth every 6 (six) hours as needed (Muscle spasm). 06/02/21   Daleen Bo, MD  ?dronedarone (MULTAQ) 400 MG tablet Take 1 tablet (400 mg total) by mouth 2 (two) times daily with a meal. 05/21/21   Evans Lance, MD  ?ENTRESTO 24-26 MG TAKE 1 TABLET BY MOUTH TWICE A DAY 07/23/21   Fay Records, MD  ?famotidine (PEPCID) 40 MG tablet Take 1 tablet (40 mg total) by mouth every evening. 05/21/21 05/21/22  Evans Lance, MD  ?furosemide (LASIX) 20 MG tablet TAKE (1) TABLET BY MOUTH ONCE DAILY. TAKE AN ADDITIONAL 1 TABLET FOR 3 POUND WEIGHT GAIN OVERNIGHT OR 5 POUNDS IN A WEEK. ?Patient taking  differently: Take 20 mg by mouth daily. Take additional tablet for 3lb weight gain overnight or 5lbs in a week 12/24/20   Ahmed Prima, Fransisco Hertz, PA-C  ?HYDROcodone-acetaminophen (NORCO/VICODIN) 5-325 MG tablet Take 1-2 tablets by mouth 4 (four) times daily as needed. 08/14/21   [provider]  ?metoprolol succinate (TOPROL-XL) 25 MG 24 hr tablet Take 0.5 tablets (12.5 mg total) by mouth daily. 06/22/21 08/21/21  Fay Records, MD  ?nitroGLYCERIN (NITROSTAT) 0.4 MG SL tablet PLACE 1 TABLET (0.4 MG TOTAL) UNDER THE TONGUE EVERY FIVE MINUTES X 3 DOSES AS NEEDED FOR CHEST PAIN. ?Patient taking differently: Place 0.4 mg under the tongue every 5 (five) minutes as needed for chest pain. 07/31/20 08/21/21  Leanor Kail, PA  ?   ? ?Allergies    ?Patient has no known allergies.   ? ?Review of Systems   ?Review of Systems ? ?Physical Exam ?Updated Vital Signs ?BP (!) 120/99   Pulse (!) 47   Temp 97.8 ?F (36.6 ?C) (Oral)   Resp 10   Ht 5\' 1"  (1.549 m)   Wt 103.4 kg   LMP 06/27/2017 (Within Weeks)   SpO2 97%   BMI 43.08 kg/m?  ?Physical Exam ?Vitals and nursing note reviewed.  ?Constitutional:   ?  General: She is not in acute distress. ?   Appearance: She is well-developed.  ?HENT:  ?   Head: Normocephalic and atraumatic.  ?   Mouth/Throat:  ?   Mouth: Mucous membranes are moist.  ?Eyes:  ?   General: Vision grossly intact. Gaze aligned appropriately.  ?   Extraocular Movements: Extraocular movements intact.  ?   Conjunctiva/sclera: Conjunctivae normal.  ?Cardiovascular:  ?   Rate and Rhythm: Normal rate and regular rhythm.  ?   Pulses: Normal pulses.  ?   Heart sounds: Normal heart sounds, S1 normal and S2 normal. No murmur heard. ?  No friction rub. No gallop.  ?Pulmonary:  ?   Effort: Pulmonary effort is normal. No respiratory distress.  ?   Breath sounds: Normal breath sounds.  ?Chest:  ?   Chest wall: Tenderness present.  ? ? ?Abdominal:  ?   General: Bowel sounds are normal.  ?   Palpations: Abdomen is  soft.  ?   Tenderness: There is no abdominal tenderness. There is no guarding or rebound.  ?   Hernia: No hernia is present.  ?Musculoskeletal:     ?   General: No swelling.  ?   Cervical back: Full passive range of motion without pain, normal range of motion and neck supple. No spinous process tenderness or muscular tenderness. Normal range of motion.  ?   Right lower leg: No edema.  ?   Left lower leg: No edema.  ?Skin: ?   General: Skin is warm and dry.  ?   Capillary Refill: Capillary refill takes less than 2 seconds.  ?   Findings: No ecchymosis, erythema, rash or wound.  ?Neurological:  ?   General: No focal deficit present.  ?   Mental Status: She is alert and oriented to person, place, and time.  ?   GCS: GCS eye subscore is 4. GCS verbal subscore is 5. GCS motor subscore is 6.  ?   Cranial Nerves: Cranial nerves 2-12 are intact.  ?   Sensory: Sensation is intact.  ?   Motor: Motor function is intact.  ?   Coordination: Coordination is intact.  ?Psychiatric:     ?   Attention and Perception: Attention normal.     ?   Mood and Affect: Mood normal.     ?   Speech: Speech normal.     ?   Behavior: Behavior normal.  ? ? ?ED Results / Procedures / Treatments   ?Labs ?(all labs ordered are listed, but only abnormal results are displayed) ?Labs Reviewed  ?BASIC METABOLIC PANEL - Abnormal; Notable for the following components:  ?    Result Value  ? BUN 22 (*)   ? Calcium 8.6 (*)   ? All other components within normal limits  ?CBC - Abnormal; Notable for the following components:  ? Hemoglobin 11.1 (*)   ? HCT 34.7 (*)   ? RDW 15.9 (*)   ? All other components within normal limits  ?TROPONIN I (HIGH SENSITIVITY)  ?TROPONIN I (HIGH SENSITIVITY)  ? ? ?EKG ?EKG Interpretation ? ?Date/Time:  Saturday September 12 2021 02:11:41 EDT ?Ventricular Rate:  62 ?PR Interval:  174 ?QRS Duration: 104 ?QT Interval:  441 ?QTC Calculation: 452 ?R Axis:   36 ?Text Interpretation: Sinus rhythm Low voltage, precordial leads Baseline wander  in lead(s) I III aVL V2 Confirmed by Gilda Crease [phone]) on 09/12/2021 4:23:09 AM ? ?Radiology ?DG Chest Portable 1 View ? ?Result Date:  09/12/2021 ?CLINICAL DATA:  Chest pain EXAM: PORTABLE CHEST 1 VIEW COMPARISON:  07/13/2021 FINDINGS: The heart size and mediastinal contours are within normal limits. Both lungs are clear. The visualized skeletal structures are unremarkable. IMPRESSION: No active disease. Electronically Signed   By: Inez Catalina M.D.   On: 09/12/2021 02:28   ? ?Procedures ?Procedures  ? ? ?Medications Ordered in ED ?Medications - No data to display ? ?ED Course/ Medical Decision Making/ A&P ?  ?                        ?Medical Decision Making ?Amount and/or Complexity of Data Reviewed ?Labs: ordered. ?Radiology: ordered. ? ? ?Presents to the emergency department for evaluation of chest pain.  Differential diagnosis includes acute coronary syndrome, unstable angina, musculoskeletal chest pain.  ? ?Patient experienced pain after sitting for some time at a awkward angle.  Pain is reproducible with movement of the torso and is very tender to the touch.  This is most consistent with musculoskeletal pain.  Cardiac work-up has been negative and is very reassuring.  Patient reassured, no further work-up necessary, discharged and follow-up as needed. ? ? ? ? ? ? ? ?Final Clinical Impression(s) / ED Diagnoses ?Final diagnoses:  ?Chest wall pain  ? ? ?Rx / DC Orders ?ED Discharge Orders   ? ? None  ? ?  ? ? ?  ?Orpah Greek, MD ?09/12/21 0505 ? ?

## 2021-09-14 ENCOUNTER — Telehealth: Payer: Self-pay | Admitting: Internal Medicine

## 2021-09-14 MED ORDER — MULTAQ 400 MG PO TABS: 400.0000 mg | ORAL_TABLET | Freq: Two times a day (BID) | ORAL | 11 refills | Status: DC

## 2021-09-14 NOTE — Telephone Encounter (Signed)
?*  STAT* If patient is at the pharmacy, call can be transferred to refill team. ? ? ?1. Which medications need to be refilled? (please list name of each medication and dose if known) Multaq  ? ?2. Which pharmacy/location (including street and city if local pharmacy) is medication to be sent to?[facility] ? ?3. Do they need a 30 day or 90 day supply? #60 and refills ? ?

## 2021-09-14 NOTE — Telephone Encounter (Signed)
Refilled to [facility], multaq 400 mg bid, #60 with RF:11 ?

## 2021-09-14 NOTE — Telephone Encounter (Signed)
Pt c/o medication issue: ? ?1. Name of Medication: Multaq 400 MG ? ?2. How are you currently taking this medication (dosage and times per day)? Has not taken in two days  ? ?3. Are you having a reaction (difficulty breathing--STAT)? No  ? ?4. What is your medication issue? Patient is stating she is needing PA before she is able to get this medication. Has been out of it for two days. Please advise.   ?

## 2021-09-15 NOTE — Telephone Encounter (Signed)
Patient notified and verbalized understanding. Pt will come get samples from office.  ? ?

## 2021-09-15 NOTE — Telephone Encounter (Signed)
Prior Authorization waiting for provider signature. Will offer pt sample of Multaq. ?

## 2021-09-22 ENCOUNTER — Telehealth: Payer: Self-pay | Admitting: Internal Medicine

## 2021-09-22 ENCOUNTER — Ambulatory Visit (INDEPENDENT_AMBULATORY_CARE_PROVIDER_SITE_OTHER): Payer: Medicaid Other | Admitting: Podiatry

## 2021-09-22 DIAGNOSIS — M10471 Other secondary gout, right ankle and foot: Secondary | ICD-10-CM

## 2021-09-22 DIAGNOSIS — M25471 Effusion, right ankle: Secondary | ICD-10-CM | POA: Diagnosis not present

## 2021-09-22 DIAGNOSIS — M25571 Pain in right ankle and joints of right foot: Secondary | ICD-10-CM | POA: Diagnosis not present

## 2021-09-22 NOTE — Telephone Encounter (Signed)
4 Boxes of samples provided to pt. Lot: [id number] ?Exp: 09/2023. Samples placed a front desk for pick up.  ?

## 2021-09-22 NOTE — Patient Instructions (Signed)
Look for a lace up ankle brace or ASO brace at [facility] or [place] ?

## 2021-09-22 NOTE — Telephone Encounter (Signed)
Noted. Forms and samples placed at front desk.  ?

## 2021-09-22 NOTE — Telephone Encounter (Signed)
New Message: ? ? ?Patient wanted you to know that she is coming by today to pick up the paperwork that Morrie Sheldon had for her. She said to please have it at the front desk for her please. ?

## 2021-09-22 NOTE — Telephone Encounter (Signed)
Patient calling the office for samples of medication: ? ? ?1.  What medication and dosage are you requesting samples for? Multaq ? ?2.  Are you currently out of this medication? Yes- waiting for prior authorization for Multaq ? ? ?

## 2021-09-24 ENCOUNTER — Telehealth: Payer: Self-pay | Admitting: Internal Medicine

## 2021-09-24 NOTE — Telephone Encounter (Signed)
Pt c/o medication issue: ? ?1. Name of Medication:  ? dapagliflozin propanediol (FARXIGA) 10 MG TABS tablet  ? ? ENTRESTO 24-26 MG  ? ? dronedarone (MULTAQ) 400 MG tablet  ? ? ?2. How are you currently taking this medication (dosage and times per day)?  ? ?3. Are you having a reaction (difficulty breathing--STAT)? No ? ?4. What is your medication issue? Pt states that pharmacy is needing Prior Authorization for these meds before they will fill them. Pt states that she is out of medication. Please advise ? ? ? ? ?

## 2021-09-24 NOTE — Telephone Encounter (Signed)
Farxiga 10 mg tablets approved: ?Request Reference Number: [id number]. FARXIGA TAB 10MG  is approved through 09/25/2022. ? ?Submitted clinical questions for Entresto 24-26 mg tablets- waiting for response ?

## 2021-09-24 NOTE — Telephone Encounter (Signed)
Wilder Glade prior authorization completed and submitted.  ?Waiting on clinical questions for Entresto through Cover My Meds. ?Appeal submitted for Multaq 400 mg tablets- waiting on response ?

## 2021-09-24 NOTE — Telephone Encounter (Signed)
Entresto Prior Authorization approved. ?Request Reference Number: [id number]. ENTRESTO TAB 24-26MG  is approved through 09/25/2022. ?

## 2021-09-27 NOTE — Progress Notes (Signed)
?  Subjective:  ?Patient ID: ANALENA TAPANI, female    DOB: Jun 16, 1965,  MRN: [id number] ? ?Chief Complaint  ?Patient presents with  ? Foot Pain  ?  np bil foot pain/ right worse  ? ? ?56 y.o. female presents with the above complaint. History confirmed with patient.  She has had pain and swelling in the right ankle for about 3 to 4 months.  She took steroids that her primary care doctor sent her and this helped quite a bit. ? ?Objective:  ?Physical Exam: ?warm, good capillary refill, no trophic changes or ulcerative lesions, normal DP and PT pulses, normal sensory exam, and pain and swelling in the right ankle lateral gutter ? ?Assessment:  ? ?1. Pain and swelling of right ankle   ? ? ? ?Plan:  ?Patient was evaluated and treated and all questions answered. ? ?Discussed with her she likely has arthritic pain or possible gouty arthritis.  I ordered lab work for her to evaluate with gout and will let her know what her lab results show after she completes this.  I did recommend stabilizing with an ankle stabilizing orthosis and she will get this off [facility].  I also recommended corticosteroid injection and after verbal consent and prepped with Betadine and 10 mg Kenalog and 2 mg of dexamethasone along with 1 cc of lidocaine was injected into the right ankle from the lateral gutter approach.  She tolerated this well.  Issue limiting her pain she return to see me as needed if it does not. ? ?Return if symptoms worsen or fail to improve.  ? ?

## 2021-09-29 ENCOUNTER — Telehealth: Payer: Self-pay

## 2021-09-29 DIAGNOSIS — I480 Paroxysmal atrial fibrillation: Secondary | ICD-10-CM

## 2021-09-29 NOTE — Telephone Encounter (Signed)
-----   Message from Marinus Maw, MD sent at 09/28/2021  9:59 PM EDT ----- ?Regarding: RE: Multaq 400 mg tablets ?Refer to Atrial fib clinic to schedule dofetilide. GT ?----- Message ----- ?From: Roseanne Reno, CMA ?Sent: 09/21/2021  12:32 PM EDT ?To: Marinus Maw, MD ?Subject: Multaq 400 mg tablets                         ? ?Prior Authorization denied for Multaq. Pt wants to know if theres another medication we can switch her to. Please advise. ? ? ? ?

## 2021-09-29 NOTE — Telephone Encounter (Signed)
Referral to A Fib clinic per Dr. Ladona Ridgel- Dofetilide ?

## 2021-10-05 ENCOUNTER — Telehealth: Payer: Self-pay | Admitting: Internal Medicine

## 2021-10-05 NOTE — Telephone Encounter (Signed)
Pt notified that Multaq samples were available for pick up at the front desk- [facility]. Pt verbalized understanding.  Lot #: [id number] Exp: 09/2023

## 2021-10-05 NOTE — Telephone Encounter (Signed)
Patient calling the office for samples of medication:   1.  What medication and dosage are you requesting samples for? dronedarone (MULTAQ) 400 MG tablet  2.  Are you currently out of this medication? Runs out today

## 2021-10-05 NOTE — Telephone Encounter (Signed)
We have samples of Multaq 400 mg available for the pt at the [place] office.

## 2021-10-14 ENCOUNTER — Telehealth (HOSPITAL_COMMUNITY): Payer: Self-pay | Admitting: *Deleted

## 2021-10-14 ENCOUNTER — Ambulatory Visit (HOSPITAL_COMMUNITY)
Admission: RE | Admit: 2021-10-14 | Discharge: 2021-10-14 | Disposition: A | Discharge status: Home / Self Care | Payer: Medicaid Other | Source: Ambulatory Visit | Attending: Physician Assistant | Admitting: Physician Assistant

## 2021-10-14 VITALS — BP 144/74 | HR 63 | Ht 61.0 in | Wt 233.0 lb

## 2021-10-14 DIAGNOSIS — E785 Hyperlipidemia, unspecified: Secondary | ICD-10-CM | POA: Insufficient documentation

## 2021-10-14 DIAGNOSIS — Z7901 Long term (current) use of anticoagulants: Secondary | ICD-10-CM | POA: Diagnosis not present

## 2021-10-14 DIAGNOSIS — I2510 Atherosclerotic heart disease of native coronary artery without angina pectoris: Secondary | ICD-10-CM | POA: Diagnosis not present

## 2021-10-14 DIAGNOSIS — R0683 Snoring: Secondary | ICD-10-CM | POA: Diagnosis not present

## 2021-10-14 DIAGNOSIS — E119 Type 2 diabetes mellitus without complications: Secondary | ICD-10-CM | POA: Insufficient documentation

## 2021-10-14 DIAGNOSIS — E669 Obesity, unspecified: Secondary | ICD-10-CM | POA: Diagnosis not present

## 2021-10-14 DIAGNOSIS — I5022 Chronic systolic (congestive) heart failure: Secondary | ICD-10-CM | POA: Diagnosis not present

## 2021-10-14 DIAGNOSIS — I110 Hypertensive heart disease with heart failure: Secondary | ICD-10-CM | POA: Insufficient documentation

## 2021-10-14 DIAGNOSIS — I480 Paroxysmal atrial fibrillation: Secondary | ICD-10-CM

## 2021-10-14 DIAGNOSIS — D6869 Other thrombophilia: Secondary | ICD-10-CM | POA: Insufficient documentation

## 2021-10-14 DIAGNOSIS — Z6841 Body Mass Index (BMI) 40.0 and over, adult: Secondary | ICD-10-CM | POA: Diagnosis not present

## 2021-10-14 LAB — BASIC METABOLIC PANEL
Anion gap: 8 (ref 5–15)
BUN: 13 mg/dL (ref 6–20)
CO2: 24 mmol/L (ref 22–32)
Calcium: 8.9 mg/dL (ref 8.9–10.3)
Chloride: 106 mmol/L (ref 98–111)
Creatinine, Ser: 0.76 mg/dL (ref 0.44–1.00)
GFR, Estimated: >60 mL/min (ref >60)
Glucose, Bld: 94 mg/dL (ref 70–99)
Potassium: 4.2 mmol/L (ref 3.5–5.1)
Sodium: 138 mmol/L (ref 135–145)

## 2021-10-14 LAB — MAGNESIUM: Magnesium: 2.1 mg/dL (ref 1.7–2.4)

## 2021-10-14 NOTE — Progress Notes (Signed)
Primary Care Physician: Redmond School, MD Primary Cardiologist: Dr Harrington Challenger Primary Electrophysiologist: Dr Lovena Le Referring Physician: Dr Smitty Knudsen Kristine Montgomery is a 56 y.o. female with a history of CAD, CHF, DM, HTN, HLD, obesity, atrial fibrillation who presents for follow up in the [facility]. Patient was hospitalized 03/2021 with afib and CHF and converted with amiodarone. She was seen by Dr Lovena Le 05/21/21 and amiodarone was changed to Multaq given her young age. Unfortunately, Multaq has proven cost prohibitive. She is referred to the AF clinic for dofetilide consideration. Patient is on Eliquis for a CHADS2VASC score of 5.  On follow up today, patient reports she has done well from a cardiac standpoint. She remains in SR. No bleeding issues on anticoagulation. She does admit to snoring and daytime somnolence.   Today, she denies symptoms of palpitations, chest pain, shortness of breath, orthopnea, PND, lower extremity edema, dizziness, presyncope, syncope, bleeding, or neurologic sequela. The patient is tolerating medications without difficulties and is otherwise without complaint today.    Atrial Fibrillation Risk Factors:  she does have symptoms or diagnosis of sleep apnea. she is agreeable to a sleep study.  she does not have a history of rheumatic fever.   she has a BMI of Body mass index is 44.02 kg/m.Marland Kitchen Filed Weights   10/14/21 0926  Weight: 105.7 kg    Family History  Problem Relation Age of Onset   Cancer Paternal Grandfather        colon   Cancer Maternal Grandmother        lung   COPD Father    Non-Hodgkin's lymphoma Mother    Diabetes Mother    Cancer Mother        cervical   Congestive Heart Failure Mother    Hypertension Sister    Other Son        fluid around heart     Atrial Fibrillation Management history:  Previous antiarrhythmic drugs: amiodarone, Multaq Previous cardioversions: none Previous ablations:  none CHADS2VASC score: 5 Anticoagulation history: Eliquis   Past Medical History:  Diagnosis Date   Acute combined systolic and diastolic CHF, NYHA class 4 ([place]) 07/29/2020   a. EF 30% by echo in 07/2020 b. EF 55-60% by echo in 11/2020   Arthritis    Asthma    CAD (coronary artery disease)    a. 07/2020: s/p NSTEMI with DES to proxLAD and DES to proxLCx with medical management recommended of the occluded RCA.   Chronic bronchitis (HCC)    COPD (chronic obstructive pulmonary disease) (HCC)    Edema extremities    History of hiatal hernia    Hyperlipidemia LDL goal <70 07/29/2020   Ovarian cyst    Pneumonia    Past Surgical History:  Procedure Laterality Date   CESAREAN SECTION     CORONARY STENT INTERVENTION N/A 07/29/2020   Procedure: CORONARY STENT INTERVENTION;  Surgeon: Troy Sine, MD;  Location: [facility];  Service: Cardiovascular;  Laterality: N/A;   KNEE ARTHROSCOPY WITH MEDIAL MENISECTOMY Right 05/23/2020   Procedure: KNEE ARTHROSCOPY WITH MEDIAL AND LATERAL MENISECTOMY; 3 COMPARTMENT SYNOVECTOMY;  Surgeon: Carole Civil, MD;  Location: [facility];  Service: Orthopedics;  Laterality: Right;   RIGHT/LEFT HEART CATH AND CORONARY ANGIOGRAPHY N/A 07/29/2020   Procedure: RIGHT/LEFT HEART CATH AND CORONARY ANGIOGRAPHY;  Surgeon: Troy Sine, MD;  Location: [facility];  Service: Cardiovascular;  Laterality: N/A;   TONSILLECTOMY     TUBAL  LIGATION      Current Outpatient Medications  Medication Sig Dispense Refill   apixaban (ELIQUIS) 5 MG TABS tablet TAKE (1) TABLET BY MOUTH TWICE DAILY. 60 tablet 11   aspirin EC 81 MG tablet Take 81 mg by mouth daily. Swallow whole.     atorvastatin (LIPITOR) 80 MG tablet Take 1 tablet (80 mg total) by mouth daily at 6 PM. 90 tablet 3   dapagliflozin propanediol (FARXIGA) 10 MG TABS tablet Take 1 tablet (10 mg total) by mouth daily. 90 tablet 3   dronedarone (MULTAQ) 400 MG tablet Take 1 tablet (400 mg total) by mouth  2 (two) times daily with a meal. 60 tablet 11   ENTRESTO 24-26 MG TAKE 1 TABLET BY MOUTH TWICE A DAY 60 tablet 5   famotidine (PEPCID) 40 MG tablet Take 1 tablet (40 mg total) by mouth every evening. 90 tablet 3   furosemide (LASIX) 20 MG tablet TAKE (1) TABLET BY MOUTH ONCE DAILY. TAKE AN ADDITIONAL 1 TABLET FOR 3 POUND WEIGHT GAIN OVERNIGHT OR 5 POUNDS IN A WEEK. (Patient taking differently: Take 20 mg by mouth daily. Take additional tablet for 3lb weight gain overnight or 5lbs in a week) 30 tablet 9   metoprolol succinate (TOPROL-XL) 25 MG 24 hr tablet Take 0.5 tablets (12.5 mg total) by mouth daily. 45 tablet 3   nitroGLYCERIN (NITROSTAT) 0.4 MG SL tablet PLACE 1 TABLET (0.4 MG TOTAL) UNDER THE TONGUE EVERY FIVE MINUTES X 3 DOSES AS NEEDED FOR CHEST PAIN. (Patient taking differently: Place 0.4 mg under the tongue every 5 (five) minutes as needed for chest pain.) 25 tablet 12   No current facility-administered medications for this encounter.    No Known Allergies  Social History   Socioeconomic History   Marital status: Single    Spouse name: Not on file   Number of children: 2   Years of education: 12   Highest education level: Not on file  Occupational History   Not on file  Tobacco Use   Smoking status: Former    Packs/day: 0.50    Years: 30.00    Pack years: 15.00    Types: Cigarettes   Smokeless tobacco: Never   Tobacco comments:    pt states last cigarette was 3/13, prior to coming into hospital. Pt plans to continue cessation.  Vaping Use   Vaping Use: Never used  Substance and Sexual Activity   Alcohol use: Not Currently    Comment: occasional   Drug use: No   Sexual activity: Yes    Birth control/protection: Post-menopausal, Surgical    Comment: tubal  Other Topics Concern   Not on file  Social History Narrative   Not on file   Social Determinants of Health   Financial Resource Strain: Not on file  Food Insecurity: Not on file  Transportation Needs: Not on  file  Physical Activity: Not on file  Stress: Not on file  Social Connections: Not on file  Intimate Partner Violence: Not on file     ROS- All systems are reviewed and negative except as per the HPI above.  Physical Exam: Vitals:   10/14/21 0926  BP: (!) 144/74  Pulse: 63  Weight: 105.7 kg  Height: 5\' 1"  (1.549 m)    GEN- The patient is a well appearing obese female, alert and oriented x 3 today.   Head- normocephalic, atraumatic Eyes-  Sclera clear, conjunctiva pink Ears- hearing intact Oropharynx- clear Neck- supple  Lungs- Clear to ausculation  bilaterally, normal work of breathing Heart- Regular rate and rhythm, no murmurs, rubs or gallops  GI- soft, NT, ND, + BS Extremities- no clubbing, cyanosis, or edema MS- no significant deformity or atrophy Skin- no rash or lesion Psych- euthymic mood, full affect Neuro- strength and sensation are intact  Wt Readings from Last 3 Encounters:  10/14/21 105.7 kg  09/12/21 103.4 kg  08/21/21 104.8 kg    EKG today demonstrates  SR Vent. rate 63 BPM PR interval 152 ms QRS duration 78 ms QT/QTcB 410/419 ms  Echo 11/14/20 demonstrated  1. Limited study.   2. Left ventricular ejection fraction, by estimation, is 55 to 60%. The  left ventricle has normal function. The left ventricle demonstrates  regional wall motion abnormalities (see scoring diagram/findings for  description).   3. Right ventricular systolic function is normal. The right ventricular  size is normal.   4. A small pericardial effusion is present. The pericardial effusion is  circumferential.   5. The inferior vena cava is normal in size with greater than 50%  respiratory variability, suggesting right atrial pressure of 3 mmHg.   Epic records are reviewed at length today  CHA2DS2-VASc Score = 5  The patient's score is based upon: CHF History: 1 HTN History: 1 Diabetes History: 1 Stroke History: 0 Vascular Disease History: 1 Age Score: 0 Gender Score:  1       ASSESSMENT AND PLAN: 1. Paroxysmal Atrial Fibrillation (ICD10:  I48.0) The patient's CHA2DS2-VASc score is 5, indicating a 7.2% annual risk of stroke.   Multaq had been effective but unfortunately it is cost prohibitive. She has not tried [employer] patient assistance, patient filled out form in office today. If she does not qualify for patient assistance, will proceed with dofetilide admission. Patient in agreement with plan.  In the meantime, patient will check on price of dofetilide. Continue Eliquis 5 mg BID QTc in SR 452 ms Check bmet/mag today. Continue Toprol 12.5 mg daily  2. Secondary Hypercoagulable State (ICD10:  D68.69) The patient is at significant risk for stroke/thromboembolism based upon her CHA2DS2-VASc Score of 5.  Continue Apixaban (Eliquis).   3. Obesity Body mass index is 44.02 kg/m. Lifestyle modification was discussed at length including regular exercise and weight reduction.  4. Snoring/daytime somnolence  The importance of adequate treatment of sleep apnea was discussed today in order to improve our ability to maintain sinus rhythm long term. Will refer for sleep study.   5. CAD No anginal symptoms Followed by Dr Harrington Challenger  6. HTN Stable, no changes today.  7. H/o HFrEF Normalized with SR. Appears euvolemic today.   Timing for follow up in AF clinic pending patient assistance decision.    [facility] [address] [place], [place] [address] [phone] 10/14/2021 10:10 AM

## 2021-10-14 NOTE — Telephone Encounter (Signed)
Patient approved for Multaq patient assistance  through 10/14/22. Pt notified.

## 2021-10-20 ENCOUNTER — Other Ambulatory Visit (HOSPITAL_COMMUNITY): Payer: Self-pay

## 2021-10-20 MED ORDER — MULTAQ 400 MG PO TABS: 400.0000 mg | ORAL_TABLET | Freq: Two times a day (BID) | ORAL | 0 refills | Status: DC

## 2021-11-02 ENCOUNTER — Ambulatory Visit (INDEPENDENT_AMBULATORY_CARE_PROVIDER_SITE_OTHER): Payer: Medicaid Other | Admitting: Orthopedic Surgery

## 2021-11-02 ENCOUNTER — Encounter: Payer: Self-pay | Admitting: Orthopedic Surgery

## 2021-11-02 DIAGNOSIS — Z9889 Other specified postprocedural states: Secondary | ICD-10-CM

## 2021-11-02 DIAGNOSIS — M25561 Pain in right knee: Secondary | ICD-10-CM | POA: Diagnosis not present

## 2021-11-02 DIAGNOSIS — G8929 Other chronic pain: Secondary | ICD-10-CM

## 2021-11-02 DIAGNOSIS — M25562 Pain in left knee: Secondary | ICD-10-CM

## 2021-11-02 NOTE — Addendum Note (Signed)
Addended byCaffie Damme on: 11/02/2021 04:03 PM   Modules accepted: Orders

## 2021-11-02 NOTE — Patient Instructions (Signed)
Procedure:  Inject AC JOINT  Left shoulder  Acromioclavicular joint injection Verbal consent was obtained Site marking was confirmed and timeout TAKEN TO CONFIRM AC JOINT INJECTION ON THE left shoulder m Medications used: Depo-Medrol 40 mg and lidocaine 1% 3 mL  Injection technique: The skin was prepared with alcohol and ethyl chloride and  the left AC JOINT was injected with a 25-gauge needle  No complications were noted  Start Therapy

## 2021-11-02 NOTE — Progress Notes (Signed)
Chief Complaint  Patient presents with   Knee Pain    Left one is worse than the right/ walks with knees flexed Wants physical therapy    Kristine Montgomery comes in with left knee pain and right knee pain.  She thinks she needs some therapy.  She does not walk upright she walks with a walker bent over and this is contributing to her chronic flexion contractures as well as the arthritis  She has medial lateral joint line tenderness in both knees she has limited extension of 5 to 7 degrees in both knees.  Her flexion is approximate 110 degrees there is no instability the right knee is warm the left knee is not there is no erythema  Recommend physical therapy she is having some issues with transportation so hopefully will get the therapy done at home stretching strengthening gait training with lower extremity rehab dilatation  Inject both knees  Follow-up as needed  Encounter Diagnoses  Name Primary?   Chronic pain of right knee Yes   Chronic pain of left knee    S/P right knee arthroscopy 05/23/20    Procedure note for bilateral knee injections  Procedure note left knee injection verbal consent was obtained to inject left knee joint  Timeout was completed to confirm the site of injection  The medications used were 40 mg depomedrol and 3 cc of 1% lidocaine  Anesthesia was provided by ethyl chloride and the skin was prepped with alcohol.  After cleaning the skin with alcohol a 20-gauge needle was used to inject the left knee joint. There were no complications. A sterile bandage was applied.   Procedure note right knee injection verbal consent was obtained to inject right knee joint  Timeout was completed to confirm the site of injection  The medications used were 40 mg depomedrol and 3 cc of 1% lidocaine  Anesthesia was provided by ethyl chloride and the skin was prepped with alcohol.  After cleaning the skin with alcohol a 20-gauge needle was used to inject the right knee joint. There were no  complications. A sterile bandage was applied.

## 2021-11-11 ENCOUNTER — Ambulatory Visit: Payer: 59 | Admitting: Internal Medicine

## 2021-11-12 ENCOUNTER — Telehealth: Payer: Self-pay | Admitting: Internal Medicine

## 2021-11-12 NOTE — Telephone Encounter (Signed)
Patient would like to know if it's okay to take dulcolax.

## 2021-11-12 NOTE — Telephone Encounter (Signed)
Pt notified and voiced understanding 

## 2021-11-12 NOTE — Telephone Encounter (Signed)
Ok to take, no issues with her other meds.

## 2021-11-23 ENCOUNTER — Other Ambulatory Visit: Payer: Self-pay | Admitting: Student

## 2021-12-10 ENCOUNTER — Emergency Department (HOSPITAL_COMMUNITY)
Admission: EM | Admit: 2021-12-10 | Discharge: 2021-12-10 | Disposition: A | Discharge status: Home / Self Care | Payer: Medicaid Other | Attending: Emergency Medicine | Admitting: Emergency Medicine

## 2021-12-10 ENCOUNTER — Other Ambulatory Visit: Payer: Self-pay

## 2021-12-10 ENCOUNTER — Encounter (HOSPITAL_COMMUNITY): Payer: Self-pay

## 2021-12-10 ENCOUNTER — Emergency Department (HOSPITAL_COMMUNITY): Payer: Medicaid Other

## 2021-12-10 DIAGNOSIS — I2510 Atherosclerotic heart disease of native coronary artery without angina pectoris: Secondary | ICD-10-CM | POA: Insufficient documentation

## 2021-12-10 DIAGNOSIS — M25572 Pain in left ankle and joints of left foot: Secondary | ICD-10-CM | POA: Diagnosis not present

## 2021-12-10 DIAGNOSIS — Z7901 Long term (current) use of anticoagulants: Secondary | ICD-10-CM | POA: Diagnosis not present

## 2021-12-10 DIAGNOSIS — G8929 Other chronic pain: Secondary | ICD-10-CM

## 2021-12-10 DIAGNOSIS — I509 Heart failure, unspecified: Secondary | ICD-10-CM | POA: Diagnosis not present

## 2021-12-10 DIAGNOSIS — Z7982 Long term (current) use of aspirin: Secondary | ICD-10-CM | POA: Insufficient documentation

## 2021-12-10 DIAGNOSIS — J449 Chronic obstructive pulmonary disease, unspecified: Secondary | ICD-10-CM | POA: Insufficient documentation

## 2021-12-10 LAB — BASIC METABOLIC PANEL
Anion gap: 7 (ref 5–15)
BUN: 17 mg/dL (ref 6–20)
CO2: 24 mmol/L (ref 22–32)
Calcium: 8.4 mg/dL — ABNORMAL LOW (ref 8.9–10.3)
Chloride: 105 mmol/L (ref 98–111)
Creatinine, Ser: 0.72 mg/dL (ref 0.44–1.00)
GFR, Estimated: >60 mL/min (ref >60)
Glucose, Bld: 98 mg/dL (ref 70–99)
Potassium: 3.6 mmol/L (ref 3.5–5.1)
Sodium: 136 mmol/L (ref 135–145)

## 2021-12-10 LAB — URINALYSIS, ROUTINE W REFLEX MICROSCOPIC
Bilirubin Urine: NEGATIVE
Glucose, UA: >=500 mg/dL — AB
Hgb urine dipstick: NEGATIVE
Ketones, ur: NEGATIVE mg/dL
Leukocytes,Ua: NEGATIVE
Nitrite: NEGATIVE
Protein, ur: NEGATIVE mg/dL
Specific Gravity, Urine: 1.025 (ref 1.005–1.030)
pH: 5 (ref 5.0–8.0)

## 2021-12-10 LAB — CBC WITH DIFFERENTIAL/PLATELET
Abs Immature Granulocytes: 0.03 10*3/uL (ref 0.00–0.07)
Basophils Absolute: 0 10*3/uL (ref 0.0–0.1)
Basophils Relative: 1 %
Eosinophils Absolute: 0.1 10*3/uL (ref 0.0–0.5)
Eosinophils Relative: 1 %
HCT: 28.6 % — ABNORMAL LOW (ref 36.0–46.0)
Hemoglobin: 8.9 g/dL — ABNORMAL LOW (ref 12.0–15.0)
Immature Granulocytes: 0 %
Lymphocytes Relative: 26 %
Lymphs Abs: 2.2 10*3/uL (ref 0.7–4.0)
MCH: 26.3 pg (ref 26.0–34.0)
MCHC: 31.1 g/dL (ref 30.0–36.0)
MCV: 84.4 fL (ref 80.0–100.0)
Monocytes Absolute: 0.8 10*3/uL (ref 0.1–1.0)
Monocytes Relative: 9 %
Neutro Abs: 5.2 10*3/uL (ref 1.7–7.7)
Neutrophils Relative %: 63 %
Platelets: 375 10*3/uL (ref 150–400)
RBC: 3.39 MIL/uL — ABNORMAL LOW (ref 3.87–5.11)
RDW: 15.5 % (ref 11.5–15.5)
WBC: 8.3 10*3/uL (ref 4.0–10.5)
nRBC: 0 % (ref 0.0–0.2)

## 2021-12-10 LAB — URIC ACID: Uric Acid, Serum: 3.5 mg/dL (ref 2.5–7.1)

## 2021-12-10 MED ORDER — DEXAMETHASONE SODIUM PHOSPHATE 10 MG/ML IJ SOLN
10.0000 mg | Freq: Once | INTRAMUSCULAR | Status: AC
  Administered 2021-12-10: 10 mg via INTRAMUSCULAR
  Filled 2021-12-10: qty 1

## 2021-12-10 MED ORDER — OXYCODONE-ACETAMINOPHEN 5-325 MG PO TABS: 1.0000 | ORAL_TABLET | Freq: Four times a day (QID) | ORAL | 0 refills | Status: DC | PRN

## 2021-12-10 MED ORDER — PREDNISONE 10 MG PO TABS: ORAL_TABLET | ORAL | 0 refills | Status: DC

## 2021-12-10 MED ORDER — OXYCODONE-ACETAMINOPHEN 5-325 MG PO TABS
1.0000 | ORAL_TABLET | Freq: Once | ORAL | Status: AC
  Administered 2021-12-10: 1 via ORAL
  Filled 2021-12-10: qty 1

## 2021-12-10 NOTE — ED Triage Notes (Addendum)
Pt reports has had difficulty walking for the past year.  Reports has been more difficulty over the past week.  Reports swelling to left leg.  Skin warm and dry, pedal pulses present.  Purple discoloration noted to R great toe for the past 1-2 months.  C/O arms feeling "like concrete" constantly for the past week.  She said its difficult to perform ADLs since this started.  Pt tearful.  Also reports frequent urination at night.

## 2021-12-10 NOTE — Discharge Instructions (Addendum)
Take your next dose of prednisone tomorrow morning.  Use the oxycodone sparingly if you need it for increased pain relief, this medication can make you drowsy so do not drive within 4 hours of taking it.  Elevate and warm compresses to your ankle may also be helpful.  Keep your appointment with Dr. Sherwood Gambler next week as you have scheduled.  With these various complaints of joint pain, swelling and stiffness in your hands, you may benefit from seeing a rheumatologist to see if you have an inflammatory condition causing the symptoms.  Please discuss this possibility with Dr. Sherwood Gambler when you see him.  Of note, your uric acid level today is normal range making your symptoms less likely that this is gout.

## 2021-12-10 NOTE — ED Provider Triage Note (Signed)
Emergency Medicine Provider Triage Evaluation Note  Kristine Montgomery , a 56 y.o. female  was evaluated in triage.  Patient presenting with "I cannot walk."  Says that she is having trouble bearing weight on her left foot.  Has been going on for the past year but says its been worse over the past week.  History of arthritis.  Also complains of hand pain.  No DVT risk factors and is currently on Eliquis due to A-fib  Review of Systems  Positive: "I cannot walk." Negative: Shortness of breath  Physical Exam  BP 107/77 (BP Location: Right Arm)   Pulse 79   Temp 98.1 F (36.7 C) (Oral)   Resp 18   Ht 5\' 1"  (1.549 m)   Wt 106.1 kg   LMP 06/27/2017 (Within Weeks)   SpO2 100%   BMI 44.21 kg/m  Gen:   Awake, no distress   Resp:  Normal effort  MSK:   Moves extremities without difficulty  Other:  Full range of motion of the ankle.  Reports tenderness to palpation.  She is crying and very anxious.  No cellulitis  Medical Decision Making  Medically screening exam initiated at 11:47 AM.  Appropriate orders placed.  Kristine Montgomery was informed that the remainder of the evaluation will be completed by another provider, this initial triage assessment does not replace that evaluation, and the importance of remaining in the ED until their evaluation is complete.  We will do x-ray   Rudell Cobb, PA-C 12/10/21 1148

## 2021-12-10 NOTE — ED Provider Notes (Signed)
[facility] EMERGENCY DEPARTMENT Provider Note   CSN: [id number] Arrival date & time: 12/10/21  1009     History  Chief Complaint  Patient presents with   Leg Pain    [name] is a 56 y.o. female with a history including COPD, CHF, CAD with history of MI, hyperlipidemia presenting for evaluation of left lower extremity pain and swelling around her ankle.  She denies injury to the ankle, states she has had previous similar episodes, in the past has been treated for presumptive gout although has not had formal testing for gout per her recollection.  She states she had similar episode when she was in the hospital last year which also involved pain and stiffness of her hands which she also experiences today as well.  She denies fevers or chills, nausea or vomiting, denies injury or trauma.  She does state with prior episodes she has been treated with steroids which have been helpful.  She also endorses chronic bilateral knee pain, has been under the care of Dr. Romeo Apple and recently completed a course of physical therapy for her knee pain.  She is told she has ligament injury in the left knee requiring surgery but since her recent MI she has been afraid to undergo any significant surgeries.  She has taken Tylenol without improvement in her pain symptoms.  She cannot take NSAIDs as she is on Eliquis.  The history is provided by the patient.       Home Medications Prior to Admission medications   Medication Sig Start Date End Date Taking? Authorizing Provider  oxyCODONE-acetaminophen (PERCOCET/ROXICET) 5-325 MG tablet Take 1 tablet by mouth every 6 (six) hours as needed. 12/10/21  Yes Rudi Bunyard, Raynelle Fanning, PA-C  predniSONE (DELTASONE) 10 MG tablet 6, 5, 4, 3, 2 then 1 tablet by mouth daily for 6 days total. 12/10/21  Yes Sydni Elizarraraz, Raynelle Fanning, PA-C  apixaban (ELIQUIS) 5 MG TABS tablet TAKE (1) TABLET BY MOUTH TWICE DAILY. 07/22/21   Pricilla Riffle, MD  aspirin EC 81 MG tablet Take 81 mg by mouth daily. Swallow  whole.    [provider]  atorvastatin (LIPITOR) 80 MG tablet Take 1 tablet (80 mg total) by mouth daily at 6 PM. 05/21/21 10/14/21  Marinus Maw, MD  dapagliflozin propanediol (FARXIGA) 10 MG TABS tablet Take 1 tablet (10 mg total) by mouth daily. 05/21/21   Marinus Maw, MD  dronedarone (MULTAQ) 400 MG tablet Take 1 tablet (400 mg total) by mouth 2 (two) times daily with a meal. 10/20/21   Fenton, [place] R, PA  ENTRESTO 24-26 MG TAKE 1 TABLET BY MOUTH TWICE A DAY 07/23/21   Pricilla Riffle, MD  famotidine (PEPCID) 40 MG tablet Take 1 tablet (40 mg total) by mouth every evening. 05/21/21 05/21/22  Marinus Maw, MD  furosemide (LASIX) 20 MG tablet TAKE (1) TABLET BY MOUTH ONCE DAILY. TAKE AN ADDITIONAL 1 TABLET FOR 3 POUND WEIGHT GAIN OVERNIGHT OR 5 POUNDS IN A WEEK. 11/23/21   Strader, [place] M, PA-C  HYDROcodone-acetaminophen (NORCO) 10-325 MG tablet Take 1-2 tablets by mouth every 6 (six) hours as needed. 10/27/21   [provider]  methylPREDNISolone (MEDROL DOSEPAK) 4 MG TBPK tablet Take by mouth as directed. 10/27/21   [provider]  metoprolol succinate (TOPROL-XL) 25 MG 24 hr tablet Take 0.5 tablets (12.5 mg total) by mouth daily. 06/22/21 10/14/21  Pricilla Riffle, MD  nitroGLYCERIN (NITROSTAT) 0.4 MG SL tablet PLACE 1 TABLET (0.4 MG TOTAL)  UNDER THE TONGUE EVERY FIVE MINUTES X 3 DOSES AS NEEDED FOR CHEST PAIN. Patient taking differently: Place 0.4 mg under the tongue every 5 (five) minutes as needed for chest pain. 07/31/20 08/21/21  Manson Passey, PA      Allergies    Patient has no known allergies.    Review of Systems   Review of Systems  Constitutional:  Negative for chills and fever.  HENT: Negative.    Respiratory: Negative.    Cardiovascular: Negative.   Gastrointestinal: Negative.   Musculoskeletal:  Positive for arthralgias and joint swelling. Negative for myalgias and neck stiffness.  Neurological:  Negative for weakness and numbness.  All other  systems reviewed and are negative.   Physical Exam Updated Vital Signs BP 128/74   Pulse 64   Temp 98.1 F (36.7 C) (Oral)   Resp 20   Ht 5\' 1"  (1.549 m)   Wt 106.1 kg   LMP 06/27/2017 (Within Weeks)   SpO2 96%   BMI 44.21 kg/m  Physical Exam Constitutional:      Appearance: She is well-developed.  HENT:     Head: Atraumatic.  Cardiovascular:     Comments: Pulses equal bilaterally Musculoskeletal:        General: Tenderness present.     Cervical back: Normal range of motion.     Left ankle: Swelling present. No deformity. Normal pulse.     Left Achilles Tendon: Normal. No tenderness.     Comments: Moderate edema around the medial and lateral malleolus of the left ankle.  There is no erythema, it is not warm to touch however she does have elicitation of pain with even gentle touch of her skin over the site.  Skin is intact, no rash.  Dorsalis pedal pulses full.  She has no calf tenderness.  Skin:    General: Skin is warm and dry.  Neurological:     Mental Status: She is alert.     Sensory: No sensory deficit.     Motor: No weakness.     Deep Tendon Reflexes: Reflexes normal.     ED Results / Procedures / Treatments   Labs (all labs ordered are listed, but only abnormal results are displayed) Labs Reviewed  URINALYSIS, ROUTINE W REFLEX MICROSCOPIC - Abnormal; Notable for the following components:      Result Value   APPearance HAZY (*)    Glucose, UA >=500 (*)    Bacteria, UA RARE (*)    All other components within normal limits  CBC WITH DIFFERENTIAL/PLATELET - Abnormal; Notable for the following components:   RBC 3.39 (*)    Hemoglobin 8.9 (*)    HCT 28.6 (*)    All other components within normal limits  BASIC METABOLIC PANEL - Abnormal; Notable for the following components:   Calcium 8.4 (*)    All other components within normal limits  URIC ACID    EKG EKG Interpretation  Date/Time:  Thursday December 10 2021 10:58:55 EDT Ventricular Rate:  76 PR  Interval:  152 QRS Duration: 60 QT Interval:  382 QTC Calculation: 429 R Axis:   85 Text Interpretation: Normal sinus rhythm Low voltage QRS When compared with ECG of 14-Oct-2021 09:39, Non-specific change in ST segment in Lateral leads Confirmed by 16-Oct-2021 [phone]) on 12/10/2021 1:41:07 PM  Radiology DG Ankle Complete Left  Result Date: 12/10/2021 CLINICAL DATA:  Pain and swelling in the left ankle with no known injury EXAM: LEFT ANKLE COMPLETE - 3+ VIEW COMPARISON:  Ankle radiographs  03/08/2021 FINDINGS: There is no acute fracture or dislocation. Ankle alignment is normal. The ankle mortise is intact. There is mild inferior calcaneal spurring and Achilles enthesopathy. There is diffuse soft tissue swelling about the ankle particularly over the medial malleolus. IMPRESSION: Soft tissue swelling particularly over the medial malleolus. No acute osseous abnormality. Electronically Signed   By: Lesia Hausen M.D.   On: 12/10/2021 13:40    Procedures Procedures    Medications Ordered in ED Medications  dexamethasone (DECADRON) injection 10 mg (10 mg Intramuscular Given 12/10/21 1416)  oxyCODONE-acetaminophen (PERCOCET/ROXICET) 5-325 MG per tablet 1 tablet (1 tablet Oral Given 12/10/21 1417)    ED Course/ Medical Decision Making/ A&P                           Medical Decision Making Patient with acute on chronic intermittent joint pain, left ankle, also with complaints of stiff sensation in her fingers with difficulty making a full fist.  Her symptoms raising the concern of some type of rheumatologic inflammatory condition.  Labs obtained today are reassuring for infection, she is got a normal WBC count.  Additionally uric acid level is normal range which does not eliminate the possibility of gout but makes it much less likely.  She was given a steroid injection along with an oxycodone tablet and she is feeling improved at this time.  We discussed home care, she has a wheelchair and a walker  at home which she uses fairly consistently.  She does have follow-up with her primary doctor next week.  I encouraged her to discuss all of the symptoms with him and to consider having a rheumatology consult at sometime in the near future if Dr. Carlena Sax feels this would be appropriate.  Amount and/or Complexity of Data Reviewed Labs: ordered.    Details: Per above Radiology: ordered.    Details: Soft tissue edema otherwise no acute findings in the left ankle.  Risk Prescription drug management.           Final Clinical Impression(s) / ED Diagnoses Final diagnoses:  Chronic pain of left ankle    Rx / DC Orders ED Discharge Orders          Ordered    predniSONE (DELTASONE) 10 MG tablet        12/10/21 1646    oxyCODONE-acetaminophen (PERCOCET/ROXICET) 5-325 MG tablet  Every 6 hours PRN        12/10/21 1646              Burgess Amor, PA-C 12/10/21 1710    Terald Sleeper, MD 12/11/21 [phone]

## 2021-12-23 ENCOUNTER — Other Ambulatory Visit: Payer: Self-pay | Admitting: Internal Medicine

## 2022-01-07 ENCOUNTER — Encounter: Payer: Self-pay | Admitting: Internal Medicine

## 2022-01-07 ENCOUNTER — Ambulatory Visit (INDEPENDENT_AMBULATORY_CARE_PROVIDER_SITE_OTHER): Payer: Medicaid Other | Admitting: Internal Medicine

## 2022-01-07 ENCOUNTER — Other Ambulatory Visit (HOSPITAL_COMMUNITY)
Admission: RE | Admit: 2022-01-07 | Discharge: 2022-01-07 | Disposition: A | Discharge status: Home / Self Care | Payer: Medicaid Other | Source: Ambulatory Visit | Attending: Internal Medicine | Admitting: Internal Medicine

## 2022-01-07 VITALS — BP 100/54 | HR 69 | Ht 61.0 in | Wt 231.0 lb

## 2022-01-07 DIAGNOSIS — I480 Paroxysmal atrial fibrillation: Secondary | ICD-10-CM

## 2022-01-07 DIAGNOSIS — M2550 Pain in unspecified joint: Secondary | ICD-10-CM | POA: Insufficient documentation

## 2022-01-07 LAB — SEDIMENTATION RATE: Sed Rate: >140 mm/hr — ABNORMAL HIGH (ref 0–22)

## 2022-01-07 NOTE — Patient Instructions (Signed)
Medication Instructions:  Your physician recommends that you continue on your current medications as directed. Please refer to the Current Medication list given to you today.  *If you need a refill on your cardiac medications before your next appointment, please call your pharmacy*   Lab Work: NONE   If you have labs (blood work) drawn today and your tests are completely normal, you will receive your results only by: MyChart Message (if you have MyChart) OR A paper copy in the mail If you have any lab test that is abnormal or we need to change your treatment, we will call you to review the results.   Testing/Procedures: NONE    Follow-Up: At [facility], you and your health needs are our priority.  As part of our continuing mission to provide you with exceptional heart care, we have created designated Provider Care Teams.  These Care Teams include your primary Cardiologist (physician) and Advanced Practice Providers (APPs -  Physician Assistants and Nurse Practitioners) who all work together to provide you with the care you need, when you need it.  We recommend signing up for the patient portal called "MyChart".  Sign up information is provided on this After Visit Summary.  MyChart is used to connect with patients for Virtual Visits (Telemedicine).  Patients are able to view lab/test results, encounter notes, upcoming appointments, etc.  Non-urgent messages can be sent to your provider as well.   To learn more about what you can do with MyChart, go to [email or web address].    Your next appointment:   1 year(s)  The format for your next appointment:   In Person  Provider:   Gregg Taylor, MD    Other Instructions Thank you for choosing [facility]!    Important Information About Sugar       

## 2022-01-07 NOTE — Progress Notes (Signed)
HPI Kristine Montgomery is referred today by Dr. Wyline Mood for evaluation of atrial fib. She is a morbidly obese woman with a h/o CAD s/p MI, s/P PCI who developed atrial fib and was placed on amiodarone and then switched to Multaq. She has maintained NSR. Her EF was down but most recently has normalized.  No Known Allergies   Current Outpatient Medications  Medication Sig Dispense Refill   apixaban (ELIQUIS) 5 MG TABS tablet TAKE (1) TABLET BY MOUTH TWICE DAILY. 60 tablet 11   aspirin EC 81 MG tablet Take 81 mg by mouth daily. Swallow whole.     atorvastatin (LIPITOR) 80 MG tablet Take 1 tablet (80 mg total) by mouth daily at 6 PM. 90 tablet 3   dapagliflozin propanediol (FARXIGA) 10 MG TABS tablet Take 1 tablet (10 mg total) by mouth daily. 90 tablet 3   dronedarone (MULTAQ) 400 MG tablet Take 1 tablet (400 mg total) by mouth 2 (two) times daily with a meal. 48 tablet 0   ENTRESTO 24-26 MG TAKE 1 TABLET BY MOUTH TWICE A DAY 180 tablet 2   famotidine (PEPCID) 40 MG tablet Take 1 tablet (40 mg total) by mouth every evening. 90 tablet 3   furosemide (LASIX) 20 MG tablet TAKE (1) TABLET BY MOUTH ONCE DAILY. TAKE AN ADDITIONAL 1 TABLET FOR 3 POUND WEIGHT GAIN OVERNIGHT OR 5 POUNDS IN A WEEK. 30 tablet 2   HYDROcodone-acetaminophen (NORCO) 10-325 MG tablet Take 1-2 tablets by mouth every 6 (six) hours as needed.     methylPREDNISolone (MEDROL DOSEPAK) 4 MG TBPK tablet Take by mouth as directed.     metoprolol succinate (TOPROL-XL) 25 MG 24 hr tablet Take 0.5 tablets (12.5 mg total) by mouth daily. 45 tablet 3   nitroGLYCERIN (NITROSTAT) 0.4 MG SL tablet PLACE 1 TABLET (0.4 MG TOTAL) UNDER THE TONGUE EVERY FIVE MINUTES X 3 DOSES AS NEEDED FOR CHEST PAIN. (Patient taking differently: Place 0.4 mg under the tongue every 5 (five) minutes as needed for chest pain.) 25 tablet 12   oxyCODONE-acetaminophen (PERCOCET/ROXICET) 5-325 MG tablet Take 1 tablet by mouth every 6 (six) hours as needed. (Patient not  taking: Reported on 01/07/2022) 10 tablet 0   predniSONE (DELTASONE) 10 MG tablet 6, 5, 4, 3, 2 then 1 tablet by mouth daily for 6 days total. (Patient not taking: Reported on 01/07/2022) 21 tablet 0   No current facility-administered medications for this visit.     Past Medical History:  Diagnosis Date   Acute combined systolic and diastolic CHF, NYHA class 4 (HCC) 07/29/2020   a. EF 30% by echo in 07/2020 b. EF 55-60% by echo in 11/2020   Arthritis    Asthma    CAD (coronary artery disease)    a. 07/2020: s/p NSTEMI with DES to proxLAD and DES to proxLCx with medical management recommended of the occluded RCA.   Chronic bronchitis (HCC)    COPD (chronic obstructive pulmonary disease) (HCC)    Edema extremities    History of hiatal hernia    Hyperlipidemia LDL goal <70 07/29/2020   Ovarian cyst    Pneumonia     ROS:   All systems reviewed and negative except as noted in the HPI.   Past Surgical History:  Procedure Laterality Date   CESAREAN SECTION     CORONARY STENT INTERVENTION N/A 07/29/2020   Procedure: CORONARY STENT INTERVENTION;  Surgeon: Lennette Bihari, MD;  Location: [facility] INVASIVE CV [facility];  Service: Cardiovascular;  Laterality: N/A;   KNEE ARTHROSCOPY WITH MEDIAL MENISECTOMY Right 05/23/2020   Procedure: KNEE ARTHROSCOPY WITH MEDIAL AND LATERAL MENISECTOMY; 3 COMPARTMENT SYNOVECTOMY;  Surgeon: Vickki Hearing, MD;  Location: [facility];  Service: Orthopedics;  Laterality: Right;   RIGHT/LEFT HEART CATH AND CORONARY ANGIOGRAPHY N/A 07/29/2020   Procedure: RIGHT/LEFT HEART CATH AND CORONARY ANGIOGRAPHY;  Surgeon: Lennette Bihari, MD;  Location: [facility] INVASIVE CV [facility];  Service: Cardiovascular;  Laterality: N/A;   TONSILLECTOMY     TUBAL LIGATION       Family History  Problem Relation Age of Onset   Cancer Paternal Grandfather        colon   Cancer Maternal Grandmother        lung   COPD Father    Non-Hodgkin's lymphoma Mother    Diabetes Mother    Cancer Mother         cervical   Congestive Heart Failure Mother    Hypertension Sister    Other Son        fluid around heart     Social History   Socioeconomic History   Marital status: Single    Spouse name: Not on file   Number of children: 2   Years of education: 12   Highest education level: Not on file  Occupational History   Not on file  Tobacco Use   Smoking status: Former    Packs/day: 0.50    Years: 30.00    Total pack years: 15.00    Types: Cigarettes   Smokeless tobacco: Never   Tobacco comments:    pt states last cigarette was 3/13, prior to coming into hospital. Pt plans to continue cessation.  Vaping Use   Vaping Use: Never used  Substance and Sexual Activity   Alcohol use: Not Currently    Comment: occasional   Drug use: No   Sexual activity: Yes    Birth control/protection: Post-menopausal, Surgical    Comment: tubal  Other Topics Concern   Not on file  Social History Narrative   Not on file   Social Determinants of Health   Financial Resource Strain: Low Risk  (07/30/2020)   Overall Financial Resource Strain (CARDIA)    Difficulty of Paying Living Expenses: Not very hard  Food Insecurity: No Food Insecurity (04/09/2020)   Hunger Vital Sign    Worried About Running Out of Food in the Last Year: Never true    Ran Out of Food in the Last Year: Never true  Transportation Needs: No Transportation Needs (04/09/2020)   PRAPARE - Administrator, Civil Service (Medical): No    Lack of Transportation (Non-Medical): No  Physical Activity: Insufficiently Active (04/09/2020)   Exercise Vital Sign    Days of Exercise per Week: 1 day    Minutes of Exercise per Session: 10 min  Stress: No Stress Concern Present (04/09/2020)   [employer] of Occupational Health - Occupational Stress Questionnaire    Feeling of Stress : Not at all  Social Connections: Socially Isolated (07/30/2020)   Social Connection and Isolation Panel [NHANES]    Frequency of Communication  with Friends and Family: More than three times a week    Frequency of Social Gatherings with Friends and Family: More than three times a week    Attends Religious Services: Never    Database administrator or Organizations: No    Attends Banker Meetings: Never    Marital Status: Widowed  Intimate Partner Violence: Not  At Risk (04/09/2020)   Humiliation, Afraid, Rape, and Kick questionnaire    Fear of Current or Ex-Partner: No    Emotionally Abused: No    Physically Abused: No    Sexually Abused: No     BP (!) 100/54   Pulse 69   Ht 5\' 1"  (1.549 m)   Wt 231 lb (104.8 kg)   LMP 06/27/2017 (Within Weeks)   SpO2 98%   BMI 43.65 kg/m   Physical Exam:  obese appearing middle aged woman, NAD HEENT: Unremarkable Neck:  No JVD, no thyromegally Lymphatics:  No adenopathy Back:  No CVA tenderness Lungs:  Clear with no wheezes HEART:  Regular rate rhythm, no murmurs, no rubs, no clicks Abd:  soft, positive bowel sounds, no organomegally, no rebound, no guarding Ext:  2 plus pulses, no edema, no cyanosis, no clubbing Skin:  No rashes no nodules Neuro:  CN II through XII intact, motor grossly intact  Assess/Plan:   PAF - she is maintaining NSR on multaq. She might be a candidate for an ablation if she can lose some more weight. Obesity - she has lost 30 lbs and I have encouraged her to lose more, ideally getting under 200 lbs. Her weight is unchanged from her last visit. HTN - her bp is controlled. Chronic systolic heart failure - her EF has normalized. She will continue her current meds.   08/25/2017 Natsumi Whitsitt,MD

## 2022-01-08 LAB — ANTINUCLEAR ANTIBODIES, IFA: ANA Ab, IFA: NEGATIVE

## 2022-01-08 LAB — RHEUMATOID FACTOR: Rheumatoid fact SerPl-aCnc: 482.3 IU/mL — ABNORMAL HIGH (ref <14.0)

## 2022-01-12 LAB — MISC LABCORP TEST (SEND OUT): Labcorp test code: 153600

## 2022-01-12 LAB — LYME DISEASE, WESTERN BLOT
IgG P18 Ab.: ABSENT
IgG P23 Ab.: ABSENT
IgG P28 Ab.: ABSENT
IgG P30 Ab.: ABSENT
IgG P39 Ab.: ABSENT
IgG P41 Ab.: ABSENT
IgG P45 Ab.: ABSENT
IgG P58 Ab.: ABSENT
IgG P66 Ab.: ABSENT
IgG P93 Ab.: ABSENT
IgM P39 Ab.: ABSENT
IgM P41 Ab.: ABSENT
Lyme IgG Wb: NEGATIVE
Lyme IgM Wb: NEGATIVE

## 2022-02-18 ENCOUNTER — Encounter: Payer: Self-pay | Admitting: Internal Medicine

## 2022-02-18 ENCOUNTER — Other Ambulatory Visit
Admission: RE | Admit: 2022-02-18 | Discharge: 2022-02-18 | Disposition: A | Discharge status: Home / Self Care | Payer: Medicare Other | Source: Ambulatory Visit | Attending: Internal Medicine | Admitting: Internal Medicine

## 2022-02-18 ENCOUNTER — Ambulatory Visit (INDEPENDENT_AMBULATORY_CARE_PROVIDER_SITE_OTHER): Payer: Medicare Other | Admitting: Internal Medicine

## 2022-02-18 VITALS — BP 90/52 | HR 70 | Ht 61.0 in | Wt 234.0 lb

## 2022-02-18 DIAGNOSIS — Z79899 Other long term (current) drug therapy: Secondary | ICD-10-CM

## 2022-02-18 LAB — BASIC METABOLIC PANEL
Anion gap: 10 (ref 5–15)
BUN: 22 mg/dL — ABNORMAL HIGH (ref 6–20)
CO2: 25 mmol/L (ref 22–32)
Calcium: 8.8 mg/dL — ABNORMAL LOW (ref 8.9–10.3)
Chloride: 106 mmol/L (ref 98–111)
Creatinine, Ser: 0.97 mg/dL (ref 0.44–1.00)
GFR, Estimated: >60 mL/min (ref >60)
Glucose, Bld: 152 mg/dL — ABNORMAL HIGH (ref 70–99)
Potassium: 4.4 mmol/L (ref 3.5–5.1)
Sodium: 141 mmol/L (ref 135–145)

## 2022-02-18 LAB — CBC
HCT: 34.2 % — ABNORMAL LOW (ref 36.0–46.0)
Hemoglobin: 10.7 g/dL — ABNORMAL LOW (ref 12.0–15.0)
MCH: 27.2 pg (ref 26.0–34.0)
MCHC: 31.3 g/dL (ref 30.0–36.0)
MCV: 87 fL (ref 80.0–100.0)
Platelets: 282 10*3/uL (ref 150–400)
RBC: 3.93 MIL/uL (ref 3.87–5.11)
RDW: 18.1 % — ABNORMAL HIGH (ref 11.5–15.5)
WBC: 11.3 10*3/uL — ABNORMAL HIGH (ref 4.0–10.5)
nRBC: 0 % (ref 0.0–0.2)

## 2022-02-18 LAB — HEMOGLOBIN A1C
Hgb A1c MFr Bld: 5.1 % (ref 4.8–5.6)
Mean Plasma Glucose: 99.67 mg/dL

## 2022-02-18 NOTE — Progress Notes (Signed)
Cardiology Office Note   Date:  02/18/2022   ID:  Kristine Montgomery, DOB April 25, 1966, MRN [id number]  PCP:  Redmond School, MD  Cardiologist:   Dorris Carnes, MD   Pateint presents for f/u of CAD     History of Present Illness: Kristine Montgomery is a 56 y.o. female with a historyof CAD (s/p NSTEMI in 07/2020 with DES to prox-LAD and DES to prox-LCx and medical management recommended of occluded RCA), HFimpEF (EF 30% by echo in 07/2020, at 55-60% by echo in  Hospitalized for afib with RVR  Converted with amiodarone    I saw the pt in April 2023   She was seen by Beckie Salts in Aug 2023  Pt denies CP   Breathing is OK     Diet:   Br   Nothing Lunch:   Hamburger fries   Chickn sandwich or fried chick   Corn   Green beans Dinner  Skip or pizza  Drinks sprite 0 or water   Current Meds  Medication Sig   apixaban (ELIQUIS) 5 MG TABS tablet TAKE (1) TABLET BY MOUTH TWICE DAILY.   aspirin EC 81 MG tablet Take 81 mg by mouth daily. Swallow whole.   atorvastatin (LIPITOR) 80 MG tablet Take 1 tablet (80 mg total) by mouth daily at 6 PM.   dapagliflozin propanediol (FARXIGA) 10 MG TABS tablet Take 1 tablet (10 mg total) by mouth daily.   dronedarone (MULTAQ) 400 MG tablet Take 1 tablet (400 mg total) by mouth 2 (two) times daily with a meal.   ENTRESTO 24-26 MG TAKE 1 TABLET BY MOUTH TWICE A DAY   famotidine (PEPCID) 40 MG tablet Take 1 tablet (40 mg total) by mouth every evening.   furosemide (LASIX) 20 MG tablet TAKE (1) TABLET BY MOUTH ONCE DAILY. TAKE AN ADDITIONAL 1 TABLET FOR 3 POUND WEIGHT GAIN OVERNIGHT OR 5 POUNDS IN A WEEK.   HYDROcodone-acetaminophen (NORCO) 10-325 MG tablet Take 1-2 tablets by mouth every 6 (six) hours as needed.   methylPREDNISolone (MEDROL DOSEPAK) 4 MG TBPK tablet Take by mouth as directed.   metoprolol succinate (TOPROL-XL) 25 MG 24 hr tablet Take 0.5 tablets (12.5 mg total) by mouth daily.   nitroGLYCERIN (NITROSTAT) 0.4 MG SL tablet PLACE 1 TABLET (0.4 MG TOTAL)  UNDER THE TONGUE EVERY FIVE MINUTES X 3 DOSES AS NEEDED FOR CHEST PAIN. (Patient taking differently: Place 0.4 mg under the tongue every 5 (five) minutes as needed for chest pain.)     Allergies:   Patient has no known allergies.   Past Medical History:  Diagnosis Date   Acute combined systolic and diastolic CHF, NYHA class 4 ([place]) 07/29/2020   a. EF 30% by echo in 07/2020 b. EF 55-60% by echo in 11/2020   Arthritis    Asthma    CAD (coronary artery disease)    a. 07/2020: s/p NSTEMI with DES to proxLAD and DES to proxLCx with medical management recommended of the occluded RCA.   Chronic bronchitis (HCC)    COPD (chronic obstructive pulmonary disease) (HCC)    Edema extremities    History of hiatal hernia    Hyperlipidemia LDL goal <70 07/29/2020   Ovarian cyst    Pneumonia     Past Surgical History:  Procedure Laterality Date   CESAREAN SECTION     CORONARY STENT INTERVENTION N/A 07/29/2020   Procedure: CORONARY STENT INTERVENTION;  Surgeon: Troy Sine, MD;  Location: [facility];  Service:  Cardiovascular;  Laterality: N/A;   KNEE ARTHROSCOPY WITH MEDIAL MENISECTOMY Right 05/23/2020   Procedure: KNEE ARTHROSCOPY WITH MEDIAL AND LATERAL MENISECTOMY; 3 COMPARTMENT SYNOVECTOMY;  Surgeon: Carole Civil, MD;  Location: [facility];  Service: Orthopedics;  Laterality: Right;   RIGHT/LEFT HEART CATH AND CORONARY ANGIOGRAPHY N/A 07/29/2020   Procedure: RIGHT/LEFT HEART CATH AND CORONARY ANGIOGRAPHY;  Surgeon: Troy Sine, MD;  Location: [facility];  Service: Cardiovascular;  Laterality: N/A;   TONSILLECTOMY     TUBAL LIGATION       Social History:  The patient  reports that she has quit smoking. Her smoking use included cigarettes. She has a 15.00 pack-year smoking history. She has never used smokeless tobacco. She reports that she does not currently use alcohol. She reports that she does not use drugs.   Family History:  The patient's family history includes COPD  in her father; Cancer in her maternal grandmother, mother, and paternal grandfather; Congestive Heart Failure in her mother; Diabetes in her mother; Hypertension in her sister; Non-Hodgkin's lymphoma in her mother; Other in her son.    ROS:  Please see the history of present illness. All other systems are reviewed and  Negative to the above problem except as noted.    PHYSICAL EXAM: VS:  BP (!) 90/52   Pulse 70   Ht 5\' 1"  (1.549 m)   Wt 234 lb (106.1 kg)   LMP 06/27/2017 (Within Weeks)   SpO2 97%   BMI 44.21 kg/m   GEN: Morbidly obese in no acute distress  HEENT: normal  Neck: no JVD, carotid bruit Cardiac: RRR; no murmurs.  No LE  edema  Respiratory:  clear to auscultation bilaterally,  GI: soft, nontender, nondistended, + BS  No hepatomegaly  MS: no deformity Moving all extremities   Skin: warm and dry, no rash Neuro:  Strength and sensation are intact Psych: euthymic mood, full affect   EKG:  EKG is not ordered today.   Lipid Panel    Component Value Date/Time   CHOL 211 (H) 07/29/2020 0236   TRIG 126 08/21/2021 1402   HDL 49 08/21/2021 1402   CHOLHDL 9.6 07/29/2020 0236   VLDL 37 07/29/2020 0236   LDLCALC 152 (H) 07/29/2020 0236      Wt Readings from Last 3 Encounters:  02/18/22 234 lb (106.1 kg)  01/07/22 231 lb (104.8 kg)  12/10/21 234 lb (106.1 kg)      ASSESSMENT AND PLAN:  1  CAD   REports no symptoms of angina       2  HL  Keep on statin  3  Hx of HFrEF    Echo in July 2022 was 55 to 60%  Keep on current meds   4  PAF   Pt maintaining SR on Multaq.  Keep on Eliquis      5  CV dz   Carotid USN this year shows minimal plaquing     COntinue meds   Plan to check CBC, BMET and A1C  Follow up in APril 2024     Current medicines are reviewed at length with the patient today.  The patient does not have concerns regarding medicines.  Signed, Dorris Carnes, MD  02/18/2022 2:39 PM    [address], [place],  [place]  [address] Phone: [phone]; Fax: [phone]

## 2022-02-18 NOTE — Patient Instructions (Signed)
Medication Instructions:  Your physician recommends that you continue on your current medications as directed. Please refer to the Current Medication list given to you today.  *If you need a refill on your cardiac medications before your next appointment, please call your pharmacy*   Lab Work: Your physician recommends that you return for lab work in: Today   If you have labs (blood work) drawn today and your tests are completely normal, you will receive your results only by: MyChart Message (if you have MyChart) OR A paper copy in the mail If you have any lab test that is abnormal or we need to change your treatment, we will call you to review the results.   Testing/Procedures: NONE    Follow-Up: At [facility], you and your health needs are our priority.  As part of our continuing mission to provide you with exceptional heart care, we have created designated Provider Care Teams.  These Care Teams include your primary Cardiologist (physician) and Advanced Practice Providers (APPs -  Physician Assistants and Nurse Practitioners) who all work together to provide you with the care you need, when you need it.  We recommend signing up for the patient portal called "MyChart".  Sign up information is provided on this After Visit Summary.  MyChart is used to connect with patients for Virtual Visits (Telemedicine).  Patients are able to view lab/test results, encounter notes, upcoming appointments, etc.  Non-urgent messages can be sent to your provider as well.   To learn more about what you can do with MyChart, go to [email or web address].    Your next appointment:    March/ April   The format for your next appointment:   In Person  Provider:   Paula Ross, MD    Other Instructions Thank you for choosing [facility]!    Important Information About Sugar       

## 2022-02-19 ENCOUNTER — Telehealth: Payer: Self-pay | Admitting: Internal Medicine

## 2022-02-19 NOTE — Telephone Encounter (Signed)
° °  Pt is calling to get lab result °

## 2022-02-19 NOTE — Telephone Encounter (Signed)
The patient has been notified of the result and verbalized understanding.  All questions (if any) were answered. Antonieta Iba, RN 02/19/2022 10:27 AM

## 2022-02-19 NOTE — Telephone Encounter (Signed)
-----   Message from Fay Records, MD sent at 02/18/2022  9:27 PM EDT ----- CBC:  HGb is mildly decreased.  Imprved from previous in July    Electrolytes and kidney funcction OK

## 2022-03-23 ENCOUNTER — Other Ambulatory Visit: Payer: Self-pay | Admitting: Student

## 2022-03-31 DIAGNOSIS — M159 Polyosteoarthritis, unspecified: Secondary | ICD-10-CM | POA: Diagnosis not present

## 2022-03-31 DIAGNOSIS — I110 Hypertensive heart disease with heart failure: Secondary | ICD-10-CM | POA: Diagnosis not present

## 2022-03-31 DIAGNOSIS — I214 Non-ST elevation (NSTEMI) myocardial infarction: Secondary | ICD-10-CM | POA: Diagnosis not present

## 2022-03-31 DIAGNOSIS — G894 Chronic pain syndrome: Secondary | ICD-10-CM | POA: Diagnosis not present

## 2022-04-20 NOTE — Progress Notes (Unsigned)
 Office Visit Note  Patient: Kristine Montgomery             Date of Birth: 11/11/1965           MRN: [id number]             PCP: Fusco, Lawrence, MD Referring: Fusco, Lawrence, MD Visit Date: 04/21/2022 Occupation: @GUAROCC@  Subjective:  No chief complaint on file.   History of Present Illness: Kristine Montgomery is a 56 y.o. female here for evaluation for rheumatoid arthritis with joint pain and synovitis with highly positive RF and ESR. She has longstnading left knee pain with effusions, MRI in 11/2019 with medial meniscus tear and tricompartmental OA worst medially.***   Labs reviewed 12/2021 ANA neg RF 482.3 ESR >140  Activities of Daily Living:  Patient reports morning stiffness for *** {minute/hour:19697}.   Patient {ACTIONS;DENIES/REPORTS:21021675::"Denies"} nocturnal pain.  Difficulty dressing/grooming: {ACTIONS;DENIES/REPORTS:21021675::"Denies"} Difficulty climbing stairs: {ACTIONS;DENIES/REPORTS:21021675::"Denies"} Difficulty getting out of chair: {ACTIONS;DENIES/REPORTS:21021675::"Denies"} Difficulty using hands for taps, buttons, cutlery, and/or writing: {ACTIONS;DENIES/REPORTS:21021675::"Denies"}  No Rheumatology ROS completed.   PMFS History:  Patient Active Problem List   Diagnosis Date Noted   Secondary hypercoagulable state (HCC) 10/14/2021   Paroxysmal atrial fibrillation (HCC) 03/23/2021   Hypokalemia 03/23/2021   Leukocytosis 03/23/2021   Thrombocytosis 03/23/2021   Elevated troponin 03/23/2021   Hyperglycemia 03/23/2021   Ischemic cardiomyopathy 07/31/2020   Coronary artery disease involving native coronary artery of native heart with unstable angina pectoris (HCC) 07/31/2020   Nocturnal hypoxemia due to obesity 07/31/2020   Morbid obesity (HCC) 07/31/2020   Tobacco abuse 07/31/2020   Acute combined systolic and diastolic CHF, NYHA class 4 (HCC) 07/29/2020   Mixed hyperlipidemia 07/29/2020   NSTEMI (non-ST elevated myocardial infarction) (HCC) 07/29/2020    Unstable angina (HCC) 07/28/2020   S/P right knee arthroscopy 05/23/20 05/28/2020   Tear of meniscus of knee joint    Synovitis of knee    Primary osteoarthritis of right knee    Encounter for screening fecal occult blood testing 04/09/2020   Encounter for gynecological examination with Papanicolaou smear of cervix 04/09/2020   Urinary tract infection without hematuria 02/27/2020   Dysuria 02/27/2020   Urge incontinence 04/10/2018   OAB (overactive bladder) 04/10/2018   Urinary frequency 04/10/2018   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 EF 30% by echo in 07/2020 b. EF 55-60% by echo in 11/2020   Arthritis    Asthma    CAD (coronary artery disease)    a. 07/2020: s/p NSTEMI with DES to proxLAD and DES to proxLCx with medical management recommended of the occluded RCA.   Chronic bronchitis (HCC)    COPD (chronic obstructive pulmonary disease) (HCC)    Edema extremities    History of hiatal hernia    Hyperlipidemia LDL goal <70 07/29/2020   Ovarian cyst    Pneumonia     Family History  Problem Relation Age of Onset   Cancer Paternal Grandfather        colon   Cancer Maternal Grandmother        lung   COPD Father    Non-Hodgkin's lymphoma Mother    Diabetes Mother    Cancer Mother        cervical   Congestive Heart Failure Mother    Hypertension Sister    Other Son          fluid around heart   Past Surgical History:  Procedure Laterality Date   CESAREAN SECTION     CORONARY STENT INTERVENTION N/A 07/29/2020   Procedure: CORONARY STENT INTERVENTION;  Surgeon: Kelly, Thomas A, MD;  Location: [facility];  Service: Cardiovascular;  Laterality: N/A;   KNEE ARTHROSCOPY WITH MEDIAL MENISECTOMY Right 05/23/2020   Procedure: KNEE ARTHROSCOPY WITH MEDIAL AND LATERAL MENISECTOMY; 3 COMPARTMENT SYNOVECTOMY;  Surgeon: Harrison, Stanley E,  MD;  Location: [facility];  Service: Orthopedics;  Laterality: Right;   RIGHT/LEFT HEART CATH AND CORONARY ANGIOGRAPHY N/A 07/29/2020   Procedure: RIGHT/LEFT HEART CATH AND CORONARY ANGIOGRAPHY;  Surgeon: Kelly, Thomas A, MD;  Location: [facility];  Service: Cardiovascular;  Laterality: N/A;   TONSILLECTOMY     TUBAL LIGATION     Social History   Social History Narrative   Not on file    There is no immunization history on file for this patient.   Objective: Vital Signs: LMP 06/27/2017 (Within Weeks)    Physical Exam   Musculoskeletal Exam: ***  CDAI Exam: CDAI Score: -- Patient Global: --; Provider Global: -- Swollen: --; Tender: -- Joint Exam 04/21/2022   No joint exam has been documented for this visit   There is currently no information documented on the homunculus. Go to the Rheumatology activity and complete the homunculus joint exam.  Investigation: No additional findings.  Imaging: No results found.  Recent Labs: Lab Results  Component Value Date   WBC 11.3 (H) 02/18/2022   HGB 10.7 (L) 02/18/2022   PLT 282 02/18/2022   NA 141 02/18/2022   K 4.4 02/18/2022   CL 106 02/18/2022   CO2 25 02/18/2022   GLUCOSE 152 (H) 02/18/2022   BUN 22 (H) 02/18/2022   CREATININE 0.97 02/18/2022   BILITOT 0.7 06/02/2021   ALKPHOS 89 06/02/2021   AST 10 (L) 06/02/2021   ALT 11 06/02/2021   PROT 7.2 06/02/2021   ALBUMIN 2.7 (L) 06/02/2021   CALCIUM 8.8 (L) 02/18/2022   GFRAA >60 10/28/2019    Speciality Comments: No specialty comments available.  Procedures:  No procedures performed Allergies: Patient has no known allergies.   Assessment / Plan:     Visit Diagnoses: No diagnosis found.  Orders: No orders of the defined types were placed in this encounter.  No orders of the defined types were placed in this encounter.   Face-to-face time spent with patient was *** minutes. Greater than 50% of time was spent in counseling and coordination of  care.  Follow-Up Instructions: No follow-ups on file.    W , MD  Note - This record has been created using Dragon software.  Chart creation errors have been sought, but may not always  have been located. Such creation errors do not reflect on  the standard of medical care.  

## 2022-04-21 ENCOUNTER — Ambulatory Visit: Payer: Medicare Other

## 2022-04-21 ENCOUNTER — Encounter: Payer: Self-pay | Admitting: Internal Medicine

## 2022-04-21 ENCOUNTER — Ambulatory Visit (INDEPENDENT_AMBULATORY_CARE_PROVIDER_SITE_OTHER): Payer: Medicare Other

## 2022-04-21 ENCOUNTER — Ambulatory Visit: Payer: Medicare Other | Attending: Internal Medicine | Admitting: Internal Medicine

## 2022-04-21 VITALS — BP 110/73 | HR 67 | Resp 18 | Ht 61.0 in | Wt 242.0 lb

## 2022-04-21 DIAGNOSIS — M1009 Idiopathic gout, multiple sites: Secondary | ICD-10-CM | POA: Insufficient documentation

## 2022-04-21 DIAGNOSIS — M1711 Unilateral primary osteoarthritis, right knee: Secondary | ICD-10-CM | POA: Diagnosis not present

## 2022-04-21 DIAGNOSIS — M79642 Pain in left hand: Secondary | ICD-10-CM

## 2022-04-21 DIAGNOSIS — M1A09X0 Idiopathic chronic gout, multiple sites, without tophus (tophi): Secondary | ICD-10-CM | POA: Diagnosis not present

## 2022-04-21 DIAGNOSIS — M059 Rheumatoid arthritis with rheumatoid factor, unspecified: Secondary | ICD-10-CM | POA: Diagnosis not present

## 2022-04-21 DIAGNOSIS — M79641 Pain in right hand: Secondary | ICD-10-CM | POA: Diagnosis not present

## 2022-04-21 DIAGNOSIS — Z79899 Other long term (current) drug therapy: Secondary | ICD-10-CM

## 2022-04-22 ENCOUNTER — Other Ambulatory Visit: Payer: Self-pay | Admitting: Internal Medicine

## 2022-04-22 NOTE — Telephone Encounter (Signed)
Rx refill sent to pharmacy. 

## 2022-04-23 LAB — HEPATITIS B CORE ANTIBODY, IGM: Hep B C IgM: NONREACTIVE

## 2022-04-23 LAB — CBC WITH DIFFERENTIAL/PLATELET
Absolute Monocytes: 605 cells/uL (ref 200–950)
Basophils Absolute: 45 cells/uL (ref 0–200)
Basophils Relative: 0.4 %
Eosinophils Absolute: 246 cells/uL (ref 15–500)
Eosinophils Relative: 2.2 %
HCT: 33.2 % — ABNORMAL LOW (ref 35.0–45.0)
Hemoglobin: 10.6 g/dL — ABNORMAL LOW (ref 11.7–15.5)
Lymphs Abs: 1758 cells/uL (ref 850–3900)
MCH: 27.1 pg (ref 27.0–33.0)
MCHC: 31.9 g/dL — ABNORMAL LOW (ref 32.0–36.0)
MCV: 84.9 fL (ref 80.0–100.0)
MPV: 11.1 fL (ref 7.5–12.5)
Monocytes Relative: 5.4 %
Neutro Abs: 8546 cells/uL — ABNORMAL HIGH (ref 1500–7800)
Neutrophils Relative %: 76.3 %
Platelets: 306 10*3/uL (ref 140–400)
RBC: 3.91 10*6/uL (ref 3.80–5.10)
RDW: 15.7 % — ABNORMAL HIGH (ref 11.0–15.0)
Total Lymphocyte: 15.7 %
WBC: 11.2 10*3/uL — ABNORMAL HIGH (ref 3.8–10.8)

## 2022-04-23 LAB — QUANTIFERON-TB GOLD PLUS
Mitogen-NIL: 0.41 IU/mL
NIL: 0.01 IU/mL
QuantiFERON-TB Gold Plus: UNDETERMINED — AB
TB1-NIL: 0.25 IU/mL
TB2-NIL: 0.2 IU/mL

## 2022-04-23 LAB — HEPATITIS B SURFACE ANTIGEN: Hepatitis B Surface Ag: NONREACTIVE

## 2022-04-23 LAB — COMPLETE METABOLIC PANEL WITH GFR
AG Ratio: 0.9 (calc) — ABNORMAL LOW (ref 1.0–2.5)
ALT: 13 U/L (ref 6–29)
AST: 11 U/L (ref 10–35)
Albumin: 3.3 g/dL — ABNORMAL LOW (ref 3.6–5.1)
Alkaline phosphatase (APISO): 85 U/L (ref 37–153)
BUN: 22 mg/dL (ref 7–25)
CO2: 26 mmol/L (ref 20–32)
Calcium: 8.8 mg/dL (ref 8.6–10.4)
Chloride: 106 mmol/L (ref 98–110)
Creat: 0.73 mg/dL (ref 0.50–1.03)
Globulin: 3.8 g/dL (calc) — ABNORMAL HIGH (ref 1.9–3.7)
Glucose, Bld: 167 mg/dL — ABNORMAL HIGH (ref 65–99)
Potassium: 4.2 mmol/L (ref 3.5–5.3)
Sodium: 142 mmol/L (ref 135–146)
Total Bilirubin: 0.4 mg/dL (ref 0.2–1.2)
Total Protein: 7.1 g/dL (ref 6.1–8.1)
eGFR: 96 mL/min/{1.73_m2} (ref >=60)

## 2022-04-23 LAB — HEPATITIS C ANTIBODY: Hepatitis C Ab: NONREACTIVE

## 2022-04-23 LAB — SEDIMENTATION RATE: Sed Rate: 101 mm/h — ABNORMAL HIGH (ref 0–30)

## 2022-04-26 ENCOUNTER — Telehealth: Payer: Self-pay

## 2022-04-26 DIAGNOSIS — M059 Rheumatoid arthritis with rheumatoid factor, unspecified: Secondary | ICD-10-CM

## 2022-04-26 MED ORDER — FOLIC ACID 1 MG PO TABS: 1.0000 mg | ORAL_TABLET | Freq: Every day | ORAL | 0 refills | Status: DC

## 2022-04-26 MED ORDER — METHOTREXATE SODIUM 2.5 MG PO TABS: 15.0000 mg | ORAL_TABLET | ORAL | 0 refills | Status: DC

## 2022-04-26 NOTE — Telephone Encounter (Signed)
Patient called in regards to lab results from 04/21/2022. Please advise. Thanks!

## 2022-04-26 NOTE — Telephone Encounter (Signed)
Lab results show a very high sedimentation rate indicating inflammation, which fits with her symptoms.  Tuberculosis test was indeterminate. This is probably because of the recent prednisone use. We will need to recheck this when she is not on steroids.  Negative hepatitis screening and no problems of kidney or liver function so we can start methotrexate as planned. 15 mg (6 tablets) weekly and folic acid 1 mg daily and we will repeat labs at next follow up.

## 2022-04-27 NOTE — Telephone Encounter (Signed)
Patient advised lab results show a very high sedimentation rate indicating inflammation, which fits with her symptoms.   Tuberculosis test was indeterminate. This is probably because of the recent prednisone use. We will need to recheck this when she is not on steroids.   Negative hepatitis screening and no problems of kidney or liver function so we can start methotrexate as planned. 15 mg (6 tablets) weekly and folic acid 1 mg daily and we will repeat labs at next follow up.  Patient advised prescriptions were sent to [facility].

## 2022-04-28 DIAGNOSIS — I255 Ischemic cardiomyopathy: Secondary | ICD-10-CM | POA: Diagnosis not present

## 2022-04-28 DIAGNOSIS — I200 Unstable angina: Secondary | ICD-10-CM | POA: Diagnosis not present

## 2022-04-28 DIAGNOSIS — M159 Polyosteoarthritis, unspecified: Secondary | ICD-10-CM | POA: Diagnosis not present

## 2022-04-28 DIAGNOSIS — I5041 Acute combined systolic (congestive) and diastolic (congestive) heart failure: Secondary | ICD-10-CM | POA: Diagnosis not present

## 2022-04-28 DIAGNOSIS — M2550 Pain in unspecified joint: Secondary | ICD-10-CM | POA: Diagnosis not present

## 2022-04-28 DIAGNOSIS — G894 Chronic pain syndrome: Secondary | ICD-10-CM | POA: Diagnosis not present

## 2022-05-24 NOTE — Progress Notes (Deleted)
Office Visit Note  Patient: Kristine Montgomery             Date of Birth: 1966-02-08           MRN: [id number]             PCP: Elfredia Nevins, MD Referring: Elfredia Nevins, MD Visit Date: 05/25/2022   Subjective:  No chief complaint on file.   History of Present Illness: Kristine Montgomery is a 57 y.o. female here for follow up for seropositive RA after starting MTX 15 mg PO weekly and folic acid 1 mg daily.***   Previous HPI 04/21/22 Kristine Montgomery is a 57 y.o. female here for evaluation for rheumatoid arthritis with joint pain and synovitis with highly positive RF and ESR. She has longstanding arthritis in multiple areas worst has been left knee pain with effusions, MRI in 11/2019 with medial meniscus tear and tricompartmental OA worst medially.  The more recent issue is having episodic flareups of pain and swelling involving fingers and wrist of both hands mostly for about the past 4 months.  She has received several prednisone tapers for a week at a time with her PCP Dr. Carlena Sax these are very helpful but symptoms return once discontinuing steroids.  She did not recall inflammation or swelling like this until this past year.  She also newly developed inflammation at the foot and ankle that started during hospitalization for her heart issues attributed to gouty arthritis.  She has not been on any longstanding gout medicine and has not been seeing flareups in the foot and ankle along with these hands changes.   Today she also has an injury on the left elbow sustained in the parking lot coming to this clinic appointment.  Before that she has not had any recent falls at least for greater than the past year.  Her walking is significantly impaired with decreased range of motion in her bilateral knees has been attributed to the osteoarthritis and probably some contracture at this point which was a concern for surgical repair from her orthopedist Dr. Romeo Apple.     Labs reviewed 12/2021 ANA neg RF  482.3 ESR >140   No Rheumatology ROS completed.   PMFS History:  Patient Active Problem List   Diagnosis Date Noted   Seropositive rheumatoid arthritis (HCC) 04/21/2022   High risk medication use 04/21/2022   Idiopathic gout of multiple sites 04/21/2022   Secondary hypercoagulable state (HCC) 10/14/2021   Paroxysmal atrial fibrillation (HCC) 03/23/2021   Hypokalemia 03/23/2021   Leukocytosis 03/23/2021   Thrombocytosis 03/23/2021   Elevated troponin 03/23/2021   Hyperglycemia 03/23/2021   Ischemic cardiomyopathy 07/31/2020   Coronary artery disease involving native coronary artery of native heart with unstable angina pectoris (HCC) 07/31/2020   Nocturnal hypoxemia due to obesity 07/31/2020   Morbid obesity (HCC) 07/31/2020   Tobacco abuse 07/31/2020   Acute combined systolic and diastolic CHF, NYHA class 4 (HCC) 07/29/2020   Mixed hyperlipidemia 07/29/2020   NSTEMI (non-ST elevated myocardial infarction) (HCC) 07/29/2020   Unstable angina (HCC) 07/28/2020   S/P right knee arthroscopy 05/23/20 05/28/2020   Tear of meniscus of knee joint    Synovitis of knee    Primary osteoarthritis of right knee    Encounter for screening fecal occult blood testing 04/09/2020   Encounter for gynecological examination with Papanicolaou smear of cervix 04/09/2020   Urinary tract infection without hematuria 02/27/2020   Dysuria 02/27/2020   Urge incontinence 04/10/2018   OAB (overactive bladder) 04/10/2018  Urinary frequency 04/10/2018   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 EF 30% by echo in 07/2020 b. EF 55-60% by echo in 11/2020   Arthritis    Asthma    CAD (coronary artery disease)    a. 07/2020: s/p NSTEMI with DES to proxLAD and DES to proxLCx with medical management recommended of the occluded RCA.   Chronic bronchitis (HCC)    COPD (chronic  obstructive pulmonary disease) (HCC)    Edema extremities    History of hiatal hernia    Hyperlipidemia LDL goal <70 07/29/2020   Ovarian cyst    Pneumonia    Rheumatoid arthritis ([place])     Family History  Problem Relation Age of Onset   Cancer Paternal Grandfather        colon   Cancer Maternal Grandmother        lung   COPD Father    Non-Hodgkin's lymphoma Mother    Diabetes Mother    Cancer Mother        cervical   Congestive Heart Failure Mother    Hypertension Sister    Other Son        fluid around heart   Past Surgical History:  Procedure Laterality Date   CESAREAN SECTION     CORONARY STENT INTERVENTION N/A 07/29/2020   Procedure: CORONARY STENT INTERVENTION;  Surgeon: Troy Sine, MD;  Location: [place] [facility];  Service: Cardiovascular;  Laterality: N/A;   KNEE ARTHROSCOPY WITH MEDIAL MENISECTOMY Right 05/23/2020   Procedure: KNEE ARTHROSCOPY WITH MEDIAL AND LATERAL MENISECTOMY; 3 COMPARTMENT SYNOVECTOMY;  Surgeon: Carole Civil, MD;  Location: [facility];  Service: Orthopedics;  Laterality: Right;   RIGHT/LEFT HEART CATH AND CORONARY ANGIOGRAPHY N/A 07/29/2020   Procedure: RIGHT/LEFT HEART CATH AND CORONARY ANGIOGRAPHY;  Surgeon: Troy Sine, MD;  Location: [facility];  Service: Cardiovascular;  Laterality: N/A;   TONSILLECTOMY     TUBAL LIGATION     Social History   Social History Narrative   Not on file    There is no immunization history on file for this patient.   Objective: Vital Signs: LMP 06/27/2017 (Within Weeks)    Physical Exam   Musculoskeletal Exam: ***  CDAI Exam: CDAI Score: -- Patient Global: --; Provider Global: -- Swollen: --; Tender: -- Joint Exam 05/25/2022   No joint exam has been documented for this visit   There is currently no information documented on the homunculus. Go to the Rheumatology activity and complete the homunculus joint exam.  Investigation: No additional findings.  Imaging: No results  found.  Recent Labs: Lab Results  Component Value Date   WBC 11.2 (H) 04/21/2022   HGB 10.6 (L) 04/21/2022   PLT 306 04/21/2022   NA 142 04/21/2022   K 4.2 04/21/2022   CL 106 04/21/2022   CO2 26 04/21/2022   GLUCOSE 167 (H) 04/21/2022   BUN 22 04/21/2022   CREATININE 0.73 04/21/2022   BILITOT 0.4 04/21/2022   ALKPHOS 89 06/02/2021   AST 11 04/21/2022   ALT 13 04/21/2022   PROT 7.1 04/21/2022   ALBUMIN 2.7 (L) 06/02/2021   CALCIUM 8.8 04/21/2022   GFRAA >60 10/28/2019   QFTBGOLDPLUS INDETERMINATE (A) 04/21/2022    Speciality Comments: No specialty comments available.  Procedures:  No procedures performed Allergies: Patient has no known allergies.  Assessment / Plan:     Visit Diagnoses: No diagnosis found.  ***  Orders: No orders of the defined types were placed in this encounter.  No orders of the defined types were placed in this encounter.    Follow-Up Instructions: No follow-ups on file.   Collier Salina, MD  Note - This record has been created using [employer].  Chart creation errors have been sought, but may not always  have been located. Such creation errors do not reflect on  the standard of medical care.

## 2022-05-25 ENCOUNTER — Ambulatory Visit: Payer: Medicare Other | Admitting: Internal Medicine

## 2022-05-28 DIAGNOSIS — I110 Hypertensive heart disease with heart failure: Secondary | ICD-10-CM | POA: Diagnosis not present

## 2022-05-28 DIAGNOSIS — I200 Unstable angina: Secondary | ICD-10-CM | POA: Diagnosis not present

## 2022-05-28 DIAGNOSIS — G894 Chronic pain syndrome: Secondary | ICD-10-CM | POA: Diagnosis not present

## 2022-05-28 DIAGNOSIS — M19079 Primary osteoarthritis, unspecified ankle and foot: Secondary | ICD-10-CM | POA: Diagnosis not present

## 2022-06-04 ENCOUNTER — Other Ambulatory Visit: Payer: Self-pay | Admitting: Internal Medicine

## 2022-06-07 MED ORDER — METOPROLOL SUCCINATE ER 25 MG PO TB24: 12.5000 mg | ORAL_TABLET | Freq: Every day | ORAL | 3 refills | Status: DC

## 2022-06-23 ENCOUNTER — Ambulatory Visit: Payer: Medicare Other | Admitting: Internal Medicine

## 2022-06-23 DIAGNOSIS — M19079 Primary osteoarthritis, unspecified ankle and foot: Secondary | ICD-10-CM | POA: Diagnosis not present

## 2022-06-23 DIAGNOSIS — I200 Unstable angina: Secondary | ICD-10-CM | POA: Diagnosis not present

## 2022-06-23 DIAGNOSIS — I480 Paroxysmal atrial fibrillation: Secondary | ICD-10-CM | POA: Diagnosis not present

## 2022-06-23 DIAGNOSIS — I5041 Acute combined systolic (congestive) and diastolic (congestive) heart failure: Secondary | ICD-10-CM | POA: Diagnosis not present

## 2022-06-23 NOTE — Progress Notes (Deleted)
Office Visit Note  Patient: Kristine Montgomery             Date of Birth: Sep 29, 1965           MRN: [id number]             PCP: Redmond School, MD Referring: Redmond School, MD Visit Date: 06/24/2022   Subjective:  No chief complaint on file.   History of Present Illness: Kristine Montgomery is a 57 y.o. female here for follow up ***   Previous HPI 04/21/22 Kristine Montgomery is a 57 y.o. female here for evaluation for rheumatoid arthritis with joint pain and synovitis with highly positive RF and ESR. She has longstanding arthritis in multiple areas worst has been left knee pain with effusions, MRI in 11/2019 with medial meniscus tear and tricompartmental OA worst medially.  The more recent issue is having episodic flareups of pain and swelling involving fingers and wrist of both hands mostly for about the past 4 months.  She has received several prednisone tapers for a week at a time with her PCP Dr. Riley Kill these are very helpful but symptoms return once discontinuing steroids.  She did not recall inflammation or swelling like this until this past year.  She also newly developed inflammation at the foot and ankle that started during hospitalization for her heart issues attributed to gouty arthritis.  She has not been on any longstanding gout medicine and has not been seeing flareups in the foot and ankle along with these hands changes.   Today she also has an injury on the left elbow sustained in the parking lot coming to this clinic appointment.  Before that she has not had any recent falls at least for greater than the past year.  Her walking is significantly impaired with decreased range of motion in her bilateral knees has been attributed to the osteoarthritis and probably some contracture at this point which was a concern for surgical repair from her orthopedist Dr. Aline Brochure.     Labs reviewed 12/2021 ANA neg RF 482.3 ESR >140   No Rheumatology ROS completed.   PMFS History:  Patient Active  Problem List   Diagnosis Date Noted   Seropositive rheumatoid arthritis ([place]) 04/21/2022   High risk medication use 04/21/2022   Idiopathic gout of multiple sites 04/21/2022   Secondary hypercoagulable state ([place]) 10/14/2021   Paroxysmal atrial fibrillation ([place]) 03/23/2021   Hypokalemia 03/23/2021   Leukocytosis 03/23/2021   Thrombocytosis 03/23/2021   Elevated troponin 03/23/2021   Hyperglycemia 03/23/2021   Ischemic cardiomyopathy 07/31/2020   Coronary artery disease involving native coronary artery of native heart with unstable angina pectoris ([place]) 07/31/2020   Nocturnal hypoxemia due to obesity 07/31/2020   Morbid obesity ([place]) 07/31/2020   Tobacco abuse 07/31/2020   Acute combined systolic and diastolic CHF, NYHA class 4 ([place]) 07/29/2020   Mixed hyperlipidemia 07/29/2020   NSTEMI (non-ST elevated myocardial infarction) ([place]) 07/29/2020   Unstable angina ([place]) 07/28/2020   S/P right knee arthroscopy 05/23/20 05/28/2020   Tear of meniscus of knee joint    Synovitis of knee    Primary osteoarthritis of right knee    Encounter for screening fecal occult blood testing 04/09/2020   Encounter for gynecological examination with Papanicolaou smear of cervix 04/09/2020   Urinary tract infection without hematuria 02/27/2020   Dysuria 02/27/2020   Urge incontinence 04/10/2018   OAB (overactive bladder) 04/10/2018   Urinary frequency 04/10/2018   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 EF 30% by echo in 07/2020 b. EF 55-60% by echo in 11/2020   Arthritis    Asthma    CAD (coronary artery disease)    a. 07/2020: s/p NSTEMI with DES to proxLAD and DES to proxLCx with medical management recommended of the occluded RCA.   Chronic bronchitis (HCC)    COPD (chronic obstructive pulmonary disease) (HCC)    Edema extremities    History of hiatal hernia     Hyperlipidemia LDL goal <70 07/29/2020   Ovarian cyst    Pneumonia    Rheumatoid arthritis ([place])     Family History  Problem Relation Age of Onset   Cancer Paternal Grandfather        colon   Cancer Maternal Grandmother        lung   COPD Father    Non-Hodgkin's lymphoma Mother    Diabetes Mother    Cancer Mother        cervical   Congestive Heart Failure Mother    Hypertension Sister    Other Son        fluid around heart   Past Surgical History:  Procedure Laterality Date   CESAREAN SECTION     CORONARY STENT INTERVENTION N/A 07/29/2020   Procedure: CORONARY STENT INTERVENTION;  Surgeon: Troy Sine, MD;  Location: [facility];  Service: Cardiovascular;  Laterality: N/A;   KNEE ARTHROSCOPY WITH MEDIAL MENISECTOMY Right 05/23/2020   Procedure: KNEE ARTHROSCOPY WITH MEDIAL AND LATERAL MENISECTOMY; 3 COMPARTMENT SYNOVECTOMY;  Surgeon: Carole Civil, MD;  Location: [facility];  Service: Orthopedics;  Laterality: Right;   RIGHT/LEFT HEART CATH AND CORONARY ANGIOGRAPHY N/A 07/29/2020   Procedure: RIGHT/LEFT HEART CATH AND CORONARY ANGIOGRAPHY;  Surgeon: Troy Sine, MD;  Location: [facility];  Service: Cardiovascular;  Laterality: N/A;   TONSILLECTOMY     TUBAL LIGATION     Social History   Social History Narrative   Not on file    There is no immunization history on file for this patient.   Objective: Vital Signs: LMP 06/27/2017 (Within Weeks)    Physical Exam   Musculoskeletal Exam: ***  CDAI Exam: CDAI Score: -- Patient Global: --; Provider Global: -- Swollen: --; Tender: -- Joint Exam 06/24/2022   No joint exam has been documented for this visit   There is currently no information documented on the homunculus. Go to the Rheumatology activity and complete the homunculus joint exam.  Investigation: No additional findings.  Imaging: No results found.  Recent Labs: Lab Results  Component Value Date   WBC 11.2 (H) 04/21/2022   HGB  10.6 (L) 04/21/2022   PLT 306 04/21/2022   NA 142 04/21/2022   K 4.2 04/21/2022   CL 106 04/21/2022   CO2 26 04/21/2022   GLUCOSE 167 (H) 04/21/2022   BUN 22 04/21/2022   CREATININE 0.73 04/21/2022   BILITOT 0.4 04/21/2022   ALKPHOS 89 06/02/2021   AST 11 04/21/2022   ALT 13 04/21/2022   PROT 7.1 04/21/2022   ALBUMIN 2.7 (L) 06/02/2021   CALCIUM 8.8 04/21/2022   GFRAA >60 10/28/2019   QFTBGOLDPLUS INDETERMINATE (A) 04/21/2022    Speciality Comments: No specialty comments available.  Procedures:  No procedures performed Allergies: Patient has no known allergies.   Assessment / Plan:     Visit Diagnoses: No diagnosis found.  ***  Orders: No orders of the defined types were placed in this encounter.  No orders of the defined types were placed in this encounter.    Follow-Up Instructions: No follow-ups on file.   Collier Salina, MD  Note - This record has been created using [employer].  Chart creation errors have been sought, but may not always  have been located. Such creation errors do not reflect on  the standard of medical care.

## 2022-06-24 ENCOUNTER — Emergency Department (HOSPITAL_COMMUNITY): Payer: 59

## 2022-06-24 ENCOUNTER — Emergency Department (HOSPITAL_COMMUNITY)
Admission: EM | Admit: 2022-06-24 | Discharge: 2022-06-24 | Disposition: A | Discharge status: Home / Self Care | Payer: 59 | Attending: Emergency Medicine | Admitting: Emergency Medicine

## 2022-06-24 ENCOUNTER — Ambulatory Visit: Payer: Medicare Other | Admitting: Internal Medicine

## 2022-06-24 ENCOUNTER — Other Ambulatory Visit: Payer: Self-pay

## 2022-06-24 DIAGNOSIS — Y92511 Restaurant or cafe as the place of occurrence of the external cause: Secondary | ICD-10-CM | POA: Insufficient documentation

## 2022-06-24 DIAGNOSIS — W1811XA Fall from or off toilet without subsequent striking against object, initial encounter: Secondary | ICD-10-CM | POA: Diagnosis not present

## 2022-06-24 DIAGNOSIS — S2231XA Fracture of one rib, right side, initial encounter for closed fracture: Secondary | ICD-10-CM | POA: Diagnosis not present

## 2022-06-24 DIAGNOSIS — Z7982 Long term (current) use of aspirin: Secondary | ICD-10-CM | POA: Insufficient documentation

## 2022-06-24 DIAGNOSIS — R0781 Pleurodynia: Secondary | ICD-10-CM | POA: Diagnosis not present

## 2022-06-24 DIAGNOSIS — Z7901 Long term (current) use of anticoagulants: Secondary | ICD-10-CM | POA: Insufficient documentation

## 2022-06-24 DIAGNOSIS — W19XXXA Unspecified fall, initial encounter: Secondary | ICD-10-CM | POA: Diagnosis not present

## 2022-06-24 DIAGNOSIS — S2241XA Multiple fractures of ribs, right side, initial encounter for closed fracture: Secondary | ICD-10-CM | POA: Diagnosis not present

## 2022-06-24 MED ORDER — KETOROLAC TROMETHAMINE 30 MG/ML IJ SOLN
30.0000 mg | Freq: Once | INTRAMUSCULAR | Status: AC
  Administered 2022-06-24: 30 mg via INTRAVENOUS
  Filled 2022-06-24: qty 1

## 2022-06-24 MED ORDER — HYDROCODONE-ACETAMINOPHEN 5-325 MG PO TABS
1.0000 | ORAL_TABLET | Freq: Once | ORAL | Status: AC
  Administered 2022-06-24: 1 via ORAL
  Filled 2022-06-24: qty 1

## 2022-06-24 NOTE — ED Triage Notes (Signed)
Pt tripped and fell in a restaurant today striking a toilet. Hitting her right side and right elbow. No abrasions noted. Pt c/o of pain to those areas. No LOC , denies hitting her head.

## 2022-06-24 NOTE — ED Notes (Signed)
X-ray at bedside

## 2022-06-24 NOTE — ED Notes (Signed)
Patient educated on incentive spirometry use. Teach back method used. Pt verbalized understanding of use.

## 2022-06-24 NOTE — ED Notes (Signed)
PA aware of pts hypotension.

## 2022-06-24 NOTE — ED Provider Notes (Signed)
[facility] Provider Note   CSN: [id number] Arrival date & time: 06/24/22  1314     History Chief Complaint  Patient presents with   Lytle Michaels    Kristine Montgomery is a 57 y.o. female.  Patient presents emergency department following a mechanical fall.  She is complaining of right-sided rib pain.  This pain was not present prior to the fall.  Patient is on Eliquis.  Denies any head strike or loss of consciousness.  Patient denies any new onset of headaches, confusion, nausea, vomiting.  Patient also denies any difficulty breathing or pain with deep inhalation but does report that it is slightly uncomfortable to move due to the pain on the right side.  Patient denies any obvious bony deformities or any cuts or lacerations anywhere on her body.  Denies any pain in right arm, right leg, low back, neck.   Fall Pertinent negatives include no chest pain, no headaches and no shortness of breath.       Home Medications Prior to Admission medications   Medication Sig Start Date End Date Taking? Authorizing Provider  apixaban (ELIQUIS) 5 MG TABS tablet TAKE (1) TABLET BY MOUTH TWICE DAILY. Patient taking differently: Take 5 mg by mouth 2 (two) times daily. 07/22/21  Yes Fay Records, MD  aspirin EC 81 MG tablet Take 81 mg by mouth daily. Swallow whole.   Yes [provider]  atorvastatin (LIPITOR) 80 MG tablet Take 1 tablet (80 mg total) by mouth daily. 04/22/22  Yes Fay Records, MD  dapagliflozin propanediol (FARXIGA) 10 MG TABS tablet TAKE 1 TABLET BY MOUTH ONCE A DAY. 04/22/22  Yes Fay Records, MD  diclofenac Sodium (VOLTAREN) 1 % GEL Apply topically. 04/28/22  Yes [provider]  dronedarone (MULTAQ) 400 MG tablet Take 1 tablet (400 mg total) by mouth 2 (two) times daily with a meal. 10/20/21  Yes Fenton, Clint R, PA  ENTRESTO 24-26 MG TAKE 1 TABLET BY MOUTH TWICE A DAY 12/23/21  Yes Fay Records, MD  famotidine (PEPCID) 40 MG tablet  Take 1 tablet (40 mg total) by mouth every evening. 05/21/21 06/24/22 Yes Evans Lance, MD  folic acid (FOLVITE) 1 MG tablet Take 1 tablet (1 mg total) by mouth daily. 04/26/22  Yes Rice, Resa Miner, MD  furosemide (LASIX) 20 MG tablet TAKE (1) TABLET BY MOUTH ONCE DAILY. TAKE AN ADDITIONAL 1 TABLET FOR 3 POUND WEIGHT GAIN OVERNIGHT OR 5 POUNDS IN A WEEK. Patient taking differently: Take 20 mg by mouth See admin instructions. Take 1 tablet by mouth once daily. Take an additional 1 tablet for 3 pound weight gain overnight or 5 pounds in a week. 03/23/22  Yes Fay Records, MD  HYDROcodone-acetaminophen [facility]) 10-325 MG tablet Take 1-2 tablets by mouth every 6 (six) hours as needed. 10/27/21  Yes [provider]  meloxicam (MOBIC) 7.5 MG tablet Take 7.5 mg by mouth 2 (two) times daily. 06/23/22  Yes [provider]  metoprolol succinate (TOPROL-XL) 25 MG 24 hr tablet TAKE 1/2 TABLET BY MOUTH ONCE DAILY. 06/07/22  Yes Fay Records, MD  nitroGLYCERIN (NITROSTAT) 0.4 MG SL tablet PLACE 1 TABLET (0.4 MG TOTAL) UNDER THE TONGUE EVERY FIVE MINUTES X 3 DOSES AS NEEDED FOR CHEST PAIN. Patient taking differently: Place 0.4 mg under the tongue every 5 (five) minutes as needed for chest pain. 07/31/20 06/24/22 Yes Bhagat, Bhavinkumar, PA  methotrexate (RHEUMATREX) 2.5 MG tablet Take 6  tablets (15 mg total) by mouth once a week. Caution:Chemotherapy. Protect from light. Patient not taking: Reported on 06/24/2022 04/26/22   Collier Salina, MD      Allergies    Patient has no known allergies.    Review of Systems   Review of Systems  Constitutional:  Negative for chills, fatigue and fever.  Respiratory:  Negative for cough, choking, chest tightness and shortness of breath.   Cardiovascular:  Negative for chest pain.  Musculoskeletal:  Positive for back pain. Negative for joint swelling, myalgias and neck pain.  Neurological:  Negative for weakness, numbness and headaches.  All other systems  reviewed and are negative.   Physical Exam Updated Vital Signs BP 106/74   Pulse 62   Temp 98.6 F (37 C) (Oral)   Resp 16   Ht 5' 1"$  (1.549 m)   Wt 107 kg   LMP 06/27/2017 (Within Weeks)   SpO2 100%   BMI 44.59 kg/m  Physical Exam Vitals and nursing note reviewed.  Constitutional:      Appearance: Normal appearance.  HENT:     Head: Normocephalic and atraumatic.  Eyes:     Conjunctiva/sclera: Conjunctivae normal.  Cardiovascular:     Rate and Rhythm: Normal rate and regular rhythm.     Pulses: Normal pulses.     Heart sounds: Normal heart sounds.  Pulmonary:     Effort: Pulmonary effort is normal. No respiratory distress.     Breath sounds: Normal breath sounds. No wheezing.  Abdominal:     General: Abdomen is flat.  Musculoskeletal:        General: Tenderness present. No swelling or deformity.     Comments: Tenderness along right ribs, but no obvious bony deformity noted or ecchymosis  Skin:    General: Skin is warm and dry.     Capillary Refill: Capillary refill takes less than 2 seconds.     Findings: No bruising, erythema or lesion.  Neurological:     General: No focal deficit present.     Mental Status: She is alert.  Psychiatric:        Mood and Affect: Mood normal.     ED Results / Procedures / Treatments   Labs (all labs ordered are listed, but only abnormal results are displayed) Labs Reviewed - No data to display  EKG None  Radiology DG Ribs Unilateral W/Chest Right  Result Date: 06/24/2022 CLINICAL DATA:  Fall EXAM: RIGHT RIBS AND CHEST - 6 VIEW COMPARISON:  None Available. FINDINGS: Minimally displaced lateral right rib fracture. There is no evidence of pneumothorax or pleural effusion. Both lungs are clear. Heart size and mediastinal contours are within normal limits. IMPRESSION: Right seventh rib fracture.  No acute cardiopulmonary process. Electronically Signed   By: Sammie Bench M.D.   On: 06/24/2022 14:20    Procedures Procedures    Medications Ordered in ED Medications  HYDROcodone-acetaminophen (NORCO/VICODIN) 5-325 MG per tablet 1 tablet (1 tablet Oral Given 06/24/22 1629)  ketorolac (TORADOL) 30 MG/ML injection 30 mg (30 mg Intravenous Given 06/24/22 1447)    ED Course/ Medical Decision Making/ A&P                           Medical Decision Making Amount and/or Complexity of Data Reviewed Radiology: ordered.  Risk Prescription drug management.   This patient presents to the ED for concern of fall. Differential diagnosis includes rib fracture, chest wall pain, pneumothorax   Imaging  Studies ordered:  I ordered imaging studies including xray  of right ribs and chest I independently visualized and interpreted imaging which showed right seventh rib fracture I agree with the radiologist interpretation   Medicines ordered and prescription drug management:  I ordered medication including toradol, Norco  for pain  Reevaluation of the patient after these medicines showed that the patient improved I have reviewed the patients home medicines and have made adjustments as needed   Problem List / ED Course:  Patient presented to the ED after a mechanical fall. She was complaining of right sided chest pain. No pain with deep inhalation. Exam was largely reassuring with only tenderness to palpation along the right side chest wall but no bruising or obvious bony deformities appreciated. Xray did show a fracture of the 7th rib on the right side which is likely the cause of her pain. Advised patient of these findings and how to manage symptoms at home. Incentive spirometer given to patient with instructions on how to use this to reduce the risk of complications from rib fractures. Since patient reported that she had doses of Norco available at home for other pain, I did not feel comfortable writing a new prescription for pain medications and instead advised patient to take her home medication for breakthrough pain as  needed. Patient was agreeable with treatment plan and discharge home. All questions answered prior to patient discharge.   Social Determinants of Health:  None  Final Clinical Impression(s) / ED Diagnoses Final diagnoses:  Closed fracture of one rib of right side, initial encounter    Rx / DC Orders ED Discharge Orders     None         Luvenia Heller, PA-C 06/25/22 2305    Sherwood Gambler, MD 06/27/22 1525

## 2022-06-24 NOTE — Discharge Instructions (Addendum)
You were seen in the ER today for a rib fracture after a fall. The biggest complication from a rib fracture is the risk of pulmonary complications so you have been sent home with an incentive spirometer to practice breathing at home. If you develop significant shortness of breath or fevers, please return to the ER for further evaluation.  Manage your pain at home with over the counter medications like Tylenol, Ibuprofen, and Aleve. Since you stated that you still have Hydrocodone available, plan on taking these as needed for breakthrough pain.

## 2022-06-28 ENCOUNTER — Telehealth: Payer: Self-pay | Admitting: Internal Medicine

## 2022-06-28 NOTE — Telephone Encounter (Signed)
  Pt c/o swelling: STAT is pt has developed SOB within 24 hours  If swelling, where is the swelling located? Both feet   How much weight have you gained and in what time span? No   Have you gained 3 pounds in a day or 5 pounds in a week? None   Do you have a log of your daily weights (if so, list)? none  Are you currently taking a fluid pill? Yes   Are you currently SOB? No   Have you traveled recently? No    Pt said, he feet is swollen and been taking fluid pill but its not working. She said, she did not gain any weight

## 2022-06-28 NOTE — Telephone Encounter (Signed)
Patient tripped over feet last week in a restaurant bathroom ,fell and sustained a rib fracture. Her rheumatoid arthritis also flared in her feet causing a burning sensation. She is not ambulating much because of her fractured rib and has not been elevating feet. No weight gain noted. She is going to elevate legs and see if this helps. She said she would call us back with update.

## 2022-07-01 ENCOUNTER — Emergency Department (HOSPITAL_COMMUNITY): Payer: 59

## 2022-07-01 ENCOUNTER — Encounter (HOSPITAL_COMMUNITY): Payer: Self-pay

## 2022-07-01 ENCOUNTER — Emergency Department (HOSPITAL_COMMUNITY)
Admission: EM | Admit: 2022-07-01 | Discharge: 2022-07-01 | Disposition: A | Discharge status: Home / Self Care | Payer: 59 | Attending: Student | Admitting: Student

## 2022-07-01 ENCOUNTER — Other Ambulatory Visit: Payer: Self-pay

## 2022-07-01 DIAGNOSIS — R600 Localized edema: Secondary | ICD-10-CM

## 2022-07-01 DIAGNOSIS — J449 Chronic obstructive pulmonary disease, unspecified: Secondary | ICD-10-CM | POA: Diagnosis not present

## 2022-07-01 DIAGNOSIS — I2510 Atherosclerotic heart disease of native coronary artery without angina pectoris: Secondary | ICD-10-CM | POA: Diagnosis not present

## 2022-07-01 DIAGNOSIS — I509 Heart failure, unspecified: Secondary | ICD-10-CM | POA: Diagnosis not present

## 2022-07-01 DIAGNOSIS — R7989 Other specified abnormal findings of blood chemistry: Secondary | ICD-10-CM | POA: Insufficient documentation

## 2022-07-01 DIAGNOSIS — Z7982 Long term (current) use of aspirin: Secondary | ICD-10-CM | POA: Diagnosis not present

## 2022-07-01 DIAGNOSIS — Z7901 Long term (current) use of anticoagulants: Secondary | ICD-10-CM | POA: Diagnosis not present

## 2022-07-01 DIAGNOSIS — M7989 Other specified soft tissue disorders: Secondary | ICD-10-CM | POA: Diagnosis present

## 2022-07-01 DIAGNOSIS — D649 Anemia, unspecified: Secondary | ICD-10-CM | POA: Diagnosis not present

## 2022-07-01 DIAGNOSIS — I10 Essential (primary) hypertension: Secondary | ICD-10-CM | POA: Diagnosis not present

## 2022-07-01 LAB — BASIC METABOLIC PANEL
Anion gap: 8 (ref 5–15)
BUN: 21 mg/dL — ABNORMAL HIGH (ref 6–20)
CO2: 25 mmol/L (ref 22–32)
Calcium: 8.5 mg/dL — ABNORMAL LOW (ref 8.9–10.3)
Chloride: 104 mmol/L (ref 98–111)
Creatinine, Ser: 0.85 mg/dL (ref 0.44–1.00)
GFR, Estimated: >60 mL/min (ref >60)
Glucose, Bld: 112 mg/dL — ABNORMAL HIGH (ref 70–99)
Potassium: 3.7 mmol/L (ref 3.5–5.1)
Sodium: 137 mmol/L (ref 135–145)

## 2022-07-01 LAB — CBC
HCT: 28.1 % — ABNORMAL LOW (ref 36.0–46.0)
Hemoglobin: 8.4 g/dL — ABNORMAL LOW (ref 12.0–15.0)
MCH: 26.6 pg (ref 26.0–34.0)
MCHC: 29.9 g/dL — ABNORMAL LOW (ref 30.0–36.0)
MCV: 88.9 fL (ref 80.0–100.0)
Platelets: 324 10*3/uL (ref 150–400)
RBC: 3.16 MIL/uL — ABNORMAL LOW (ref 3.87–5.11)
RDW: 17.3 % — ABNORMAL HIGH (ref 11.5–15.5)
WBC: 8.4 10*3/uL (ref 4.0–10.5)
nRBC: 0 % (ref 0.0–0.2)

## 2022-07-01 LAB — BRAIN NATRIURETIC PEPTIDE: B Natriuretic Peptide: 72 pg/mL (ref 0.0–100.0)

## 2022-07-01 MED ORDER — ONDANSETRON 8 MG PO TBDP
8.0000 mg | ORAL_TABLET | Freq: Once | ORAL | Status: AC
  Administered 2022-07-01: 8 mg via ORAL
  Filled 2022-07-01: qty 1

## 2022-07-01 MED ORDER — FUROSEMIDE 10 MG/ML IJ SOLN
20.0000 mg | Freq: Once | INTRAMUSCULAR | Status: AC
  Administered 2022-07-01: 20 mg via INTRAVENOUS
  Filled 2022-07-01: qty 2

## 2022-07-01 MED ORDER — MORPHINE SULFATE (PF) 4 MG/ML IV SOLN
4.0000 mg | Freq: Once | INTRAVENOUS | Status: AC
  Administered 2022-07-01: 4 mg via INTRAVENOUS
  Filled 2022-07-01: qty 1

## 2022-07-01 NOTE — Discharge Instructions (Signed)
Evaluation for your lower extremity edema was overall reassuring.  I am encouraged that you were very responsive to the Lasix.  Recommend that you double your Lasix dose at home for the next 3 days then resume your normal dose.  Also in the meantime please follow-up with your cardiologist as soon as possible.  You may need an adjustment of your diuretic regimen.  If you have new shortness of breath, chest pain, tenderness in your calves, urinary changes or any other concerning symptom please return to the emergency department for further evaluation.

## 2022-07-01 NOTE — ED Provider Notes (Signed)
[facility] Provider Note   CSN: [id number] Arrival date & time: 07/01/22  0957     History  Chief Complaint  Patient presents with   Leg Swelling   HPI PRICILLA Montgomery is a 57 y.o. female with COPD, afib, CAD, CHF, and hyperlipidemia presenting to for bilateral leg swelling.  Started Saturday.  States she does take Lasix for CHF and has been compliant with her medication.  She has been on same dose for the last 2 years.  Both legs are swelling left is slightly greater than right.  No calf tenderness.  Denies shortness of breath or chest pain.  Now hurting to walk and having difficulty bearing weight ultimately prompting her to be evaluated today.  Denies urinary changes.  HPI     Home Medications Prior to Admission medications   Medication Sig Start Date End Date Taking? Authorizing Provider  apixaban (ELIQUIS) 5 MG TABS tablet TAKE (1) TABLET BY MOUTH TWICE DAILY. Patient taking differently: Take 5 mg by mouth 2 (two) times daily. 07/22/21  Yes Fay Records, MD  aspirin EC 81 MG tablet Take 81 mg by mouth daily. Swallow whole.   Yes [provider]  atorvastatin (LIPITOR) 80 MG tablet Take 1 tablet (80 mg total) by mouth daily. 04/22/22  Yes Fay Records, MD  dapagliflozin propanediol (FARXIGA) 10 MG TABS tablet TAKE 1 TABLET BY MOUTH ONCE A DAY. 04/22/22  Yes Fay Records, MD  dronedarone (MULTAQ) 400 MG tablet Take 1 tablet (400 mg total) by mouth 2 (two) times daily with a meal. 10/20/21  Yes Fenton, Clint R, PA  ENTRESTO 24-26 MG TAKE 1 TABLET BY MOUTH TWICE A DAY 12/23/21  Yes Fay Records, MD  famotidine (PEPCID) 40 MG tablet Take 1 tablet (40 mg total) by mouth every evening. 05/21/21 07/01/22 Yes Evans Lance, MD  folic acid (FOLVITE) 1 MG tablet Take 1 tablet (1 mg total) by mouth daily. 04/26/22  Yes Rice, Resa Miner, MD  furosemide (LASIX) 20 MG tablet TAKE (1) TABLET BY MOUTH ONCE DAILY. TAKE AN ADDITIONAL 1 TABLET FOR  3 POUND WEIGHT GAIN OVERNIGHT OR 5 POUNDS IN A WEEK. Patient taking differently: Take 20 mg by mouth See admin instructions. Take 1 tablet by mouth once daily. Take an additional 1 tablet for 3 pound weight gain overnight or 5 pounds in a week. 03/23/22  Yes Fay Records, MD  HYDROcodone-acetaminophen [facility]) 10-325 MG tablet Take 1-2 tablets by mouth every 6 (six) hours as needed. 10/27/21  Yes [provider]  metoprolol succinate (TOPROL-XL) 25 MG 24 hr tablet TAKE 1/2 TABLET BY MOUTH ONCE DAILY. 06/07/22  Yes Fay Records, MD  nitroGLYCERIN (NITROSTAT) 0.4 MG SL tablet PLACE 1 TABLET (0.4 MG TOTAL) UNDER THE TONGUE EVERY FIVE MINUTES X 3 DOSES AS NEEDED FOR CHEST PAIN. Patient taking differently: Place 0.4 mg under the tongue every 5 (five) minutes as needed for chest pain. 07/31/20 07/01/22 Yes Bhagat, Bhavinkumar, PA  diclofenac Sodium (VOLTAREN) 1 % GEL Apply topically. 04/28/22   [provider]  meloxicam (MOBIC) 7.5 MG tablet Take 7.5 mg by mouth 2 (two) times daily. Patient not taking: Reported on 07/01/2022 06/23/22   [provider]  methotrexate (RHEUMATREX) 2.5 MG tablet Take 6 tablets (15 mg total) by mouth once a week. Caution:Chemotherapy. Protect from light. Patient not taking: Reported on 06/24/2022 04/26/22   Collier Salina, MD  Allergies    Patient has no known allergies.    Review of Systems   Review of Systems  Musculoskeletal:        Leg pain and swelling bilaterally    Physical Exam   Vitals:   07/01/22 1500 07/01/22 1600  BP: 127/66 126/72  Pulse: 70 77  Resp:    Temp:    SpO2: 97% 97%    CONSTITUTIONAL:  well-appearing, NAD NEURO:  Alert and oriented x 3, CN 3-12 grossly intact EYES:  eyes equal and reactive ENT/NECK:  Supple, no stridor  CARDIO: regular rate and rhythm, appears well-perfused  PULM:  No respiratory distress, CTAB GI/GU:  non-distended, soft MSK/SPINE: Bilateral pitting edema noted around the ankles  extending to the toes and up to the mid calf, 2+.  Edema grossly symmetric.  DP pulses +1 bilaterally. SKIN:  no rash, atraumatic   *Additional and/or pertinent findings included in MDM below    ED Results / Procedures / Treatments   Labs (all labs ordered are listed, but only abnormal results are displayed) Labs Reviewed  BASIC METABOLIC PANEL - Abnormal; Notable for the following components:      Result Value   Glucose, Bld 112 (*)    BUN 21 (*)    Calcium 8.5 (*)    All other components within normal limits  CBC - Abnormal; Notable for the following components:   RBC 3.16 (*)    Hemoglobin 8.4 (*)    HCT 28.1 (*)    MCHC 29.9 (*)    RDW 17.3 (*)    All other components within normal limits  BRAIN NATRIURETIC PEPTIDE    EKG EKG Interpretation  Date/Time:  Thursday July 01 2022 10:30:16 EST Ventricular Rate:  79 PR Interval:  160 QRS Duration: 74 QT Interval:  384 QTC Calculation: 440 R Axis:   59 Text Interpretation: Normal sinus rhythm Low voltage QRS Nonspecific ST and T wave abnormality Abnormal ECG When compared with ECG of 10-Dec-2021 10:58, No significant change was found No significant change since last tracing Confirmed by Isla Pence [phone]) on 07/01/2022 4:34:58 PM  Radiology DG Chest 2 View  Result Date: 07/01/2022 CLINICAL DATA:  Heart failure. EXAM: CHEST - 2 VIEW COMPARISON:  06/24/2022. FINDINGS: 1052 hours. No focal airspace opacity. Stable mild cardiomegaly and mediastinal contours. No pleural effusion or pneumothorax. IMPRESSION: Mild cardiomegaly without evidence of acute cardiopulmonary disease. Electronically Signed   By: Emmit Alexanders M.D.   On: 07/01/2022 11:11    Procedures Procedures    Medications Ordered in ED Medications  furosemide (LASIX) injection 20 mg (20 mg Intravenous Given 07/01/22 1438)  morphine (PF) 4 MG/ML injection 4 mg (4 mg Intravenous Given 07/01/22 1439)  ondansetron (ZOFRAN-ODT) disintegrating tablet 8 mg (8  mg Oral Given 07/01/22 1440)    ED Course/ Medical Decision Making/ A&P                             Medical Decision Making Amount and/or Complexity of Data Reviewed Labs: ordered. Radiology: ordered.  Risk Prescription drug management.   Initial Impression and Ddx 57 yo female who is well appearing and hemodynamically stable presenting for bilateral leg swelling. Exam notable for bilateral symmetric pitting 2+ edema. Differential diagnosis CHF exacerbation, venous stasis, DVT, PE, ACS and AKI. Patient PMH that increases complexity of ED encounter:  COPD, afib, CAD, CHF, and hyperlipidemia  Interpretation of Diagnostics - I independent reviewed and interpreted the  labs as followed: Anemia, elevated BUN  - I independently visualized the following imaging with scope of interpretation limited to determining acute life threatening conditions related to emergency care: CXR, which revealed mild cardiomegaly with no active cardiopulmonary disease  - I personally reviewed and interpreted EKG which revealed normal sinus rhythm  Patient Reassessment and Ultimate Disposition/Management Given notable lower extremity edema, initial concern was CHF exacerbation but unlikely given no shortness of breath or chest pain, stable x-ray and normal BNP.  Also considered DVT but unlikely given the swelling was symmetric without calf tenderness.  Considered PE but unlikely given no chest pain or shortness of breath.  Also considered ACS but unlikely given reassuring EKG and no chest pain or shortness of breath.  Considered AKI but unlikely given reassuring BMP and no urinary changes.  Treated lower extremity edema with IV Lasix.  Urine output was 1.2 L after reevaluation.  Patient stated that she felt much better.  Treat her pain with morphine.  Treated nausea with Zofran.  Advised her to follow-up with her cardiologist for possible adjustment of her diuretic regimen.  Advised her to double her Lasix dose for the  next 3 days and resume her regular dose.  Patient management required discussion with the following services or consulting groups:  None  Complexity of Problems Addressed Acute complicated illness or Injury  Additional Data Reviewed and Analyzed Further history obtained from: Past medical history and medications listed in the EMR and Recent Consult notes  Patient Encounter Risk Assessment Consideration of hospitalization         Final Clinical Impression(s) / ED Diagnoses Final diagnoses:  Bilateral lower extremity edema    Rx / DC Orders ED Discharge Orders     None         Harriet Pho, PA-C 07/01/22 1658    Teressa Lower, MD 07/01/22 2044

## 2022-07-01 NOTE — ED Triage Notes (Signed)
Complains of bilateral leg swelling.  Hx CHF on lasix.  No doses missed.  Denies chest pain or shorntess of breath.  Did have fall last week so she is complaining of back and right rib pain.

## 2022-07-01 NOTE — ED Provider Triage Note (Signed)
Emergency Medicine Provider Triage Evaluation Note  Kristine Montgomery , a 57 y.o. female  was evaluated in triage.  Pt complains of edema in the lower extremity edema.. hx of same. Takes lasix. Has not weighed. Denies SOB. Less active due to recent rib frx  Review of Systems  Positive: edema Negative: sob  Physical Exam  BP 107/74 (BP Location: Left Arm)   Pulse 76   Temp 98.6 F (37 C) (Oral)   Resp 20   Ht 5' 1"$  (1.549 m)   Wt 107 kg   LMP 06/27/2017 (Within Weeks)   SpO2 96%   BMI 44.59 kg/m  Gen:   Awake, no distress   Resp:  Normal effort  MSK:   Moves extremities without difficulty  Other:  edema  Medical Decision Making  Medically screening exam initiated at 10:49 AM.  Appropriate orders placed.  Kristine Montgomery was informed that the remainder of the evaluation will be completed by another provider, this initial triage assessment does not replace that evaluation, and the importance of remaining in the ED until their evaluation is complete.     Margarita Mail, PA-C 07/01/22 1051

## 2022-07-07 ENCOUNTER — Ambulatory Visit: Payer: 59 | Attending: Physician Assistant | Admitting: Physician Assistant

## 2022-07-07 ENCOUNTER — Encounter: Payer: Self-pay | Admitting: Physician Assistant

## 2022-07-07 VITALS — BP 136/88 | HR 70 | Ht 61.0 in | Wt 238.0 lb

## 2022-07-07 DIAGNOSIS — I480 Paroxysmal atrial fibrillation: Secondary | ICD-10-CM | POA: Diagnosis not present

## 2022-07-07 DIAGNOSIS — I5042 Chronic combined systolic (congestive) and diastolic (congestive) heart failure: Secondary | ICD-10-CM | POA: Diagnosis not present

## 2022-07-07 DIAGNOSIS — R600 Localized edema: Secondary | ICD-10-CM | POA: Diagnosis not present

## 2022-07-07 DIAGNOSIS — Z79899 Other long term (current) drug therapy: Secondary | ICD-10-CM | POA: Diagnosis not present

## 2022-07-07 DIAGNOSIS — I25110 Atherosclerotic heart disease of native coronary artery with unstable angina pectoris: Secondary | ICD-10-CM

## 2022-07-07 DIAGNOSIS — I5030 Unspecified diastolic (congestive) heart failure: Secondary | ICD-10-CM

## 2022-07-07 MED ORDER — FUROSEMIDE 40 MG PO TABS: ORAL_TABLET | ORAL | 0 refills | Status: DC

## 2022-07-07 MED ORDER — POTASSIUM CHLORIDE CRYS ER 20 MEQ PO TBCR: EXTENDED_RELEASE_TABLET | ORAL | 0 refills | Status: DC

## 2022-07-07 NOTE — Progress Notes (Signed)
Office Visit    Patient Name: Kristine Montgomery Date of Encounter: 07/07/2022  PCP:  [facility], [facility]  Cardiologist:  Dorris Carnes, MD  Advanced Practice Provider:  No care team member to display Electrophysiologist:  None   HPI    Kristine Montgomery is a 57 y.o. 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  The patient was seen April 2023.  She was seen by Dr. Lovena Le in August 2023.  She denied chest pain.  No shortness of breath.  Last seen by Dr. Harrington Challenger October 2023 blood pressure was low normal at this time.  She was maintaining normal sinus rhythm on Multaq, remained on Eliquis.  No symptoms of angina at this time.  More recently, the patient was in the ED for bilateral lower extremity edema on 07/01/2022.  She stated it started that Saturday.  She was on Lasix for CHF and was compliant with her medication.  She had been on the same dose for the last 2 years.  Both legs were swelling left slightly greater than right.  No calf tenderness.  Labs revealed anemia and elevated BUN.  Treatment was with IV Lasix and urine output was 1.2 L after reevaluation.  Patient felt much better.  Nausea was treated with Zofran.  She was advised to double her Lasix dose for the next 3 days and then resume her regular dose.  Today, she states that she was in the ED on the 15th for lower extremity edema.  She got some IV Lasix but she felt like it did not really help.  Still had swelling when she left the ED.  Was discharged on her home dose of Lasix which is 20 mg daily.  We discussed several things today to help control her lower extremity edema.  I suggested elevating  her feet when able as well as wearing compression hose, discussed elastic therapy in [place].  We discussed daily weights and increasing her diuretics.  She is encouraged to limit her salt intake. No SOB or CP.  Reports no shortness of breath nor dyspnea on exertion. Reports no chest pain, pressure, or tightness. No orthopnea, PND. Reports no palpitations.   Past Medical History    Past Medical History:  Diagnosis Date   Acute combined systolic and diastolic CHF, NYHA class 4 ([place]) 07/29/2020   a. EF 30% by echo in 07/2020 b. EF 55-60% by echo in 11/2020   Arthritis    Asthma    CAD (coronary artery disease)    a. 07/2020: s/p NSTEMI with DES to proxLAD and DES to proxLCx with medical management recommended of the occluded RCA.   Chronic bronchitis (HCC)    COPD (chronic obstructive pulmonary disease) (HCC)    Edema extremities    History of hiatal hernia    Hyperlipidemia LDL goal <70 07/29/2020   Ovarian cyst    Pneumonia    Rheumatoid arthritis ([place])    Past Surgical History:  Procedure Laterality Date   CESAREAN SECTION     CORONARY STENT INTERVENTION N/A 07/29/2020   Procedure: CORONARY STENT INTERVENTION;  Surgeon: Troy Sine, MD;  Location: [facility];  Service: Cardiovascular;  Laterality: N/A;   KNEE ARTHROSCOPY WITH MEDIAL MENISECTOMY Right 05/23/2020   Procedure: KNEE ARTHROSCOPY WITH MEDIAL AND LATERAL MENISECTOMY; 3 COMPARTMENT SYNOVECTOMY;  Surgeon: Carole Civil, MD;  Location: [facility];  Service: Orthopedics;  Laterality: Right;   RIGHT/LEFT HEART CATH AND CORONARY ANGIOGRAPHY N/A 07/29/2020   Procedure: RIGHT/LEFT HEART CATH AND CORONARY ANGIOGRAPHY;  Surgeon: Troy Sine, MD;  Location: [facility];  Service: Cardiovascular;  Laterality: N/A;   TONSILLECTOMY     TUBAL LIGATION      Allergies  No Known Allergies  EKGs/Labs/Other Studies Reviewed:   The following studies were reviewed today:  Echo 11/14/20  IMPRESSIONS     1.  Limited study.   2. Left ventricular ejection fraction, by estimation, is 55 to 60%. The  left ventricle has normal function. The left ventricle demonstrates  regional wall motion abnormalities (see scoring diagram/findings for  description).   3. Right ventricular systolic function is normal. The right ventricular  size is normal.   4. A small pericardial effusion is present. The pericardial effusion is  circumferential.   5. The inferior vena cava is normal in size with greater than 50%  respiratory variability, suggesting right atrial pressure of 3 mmHg.   FINDINGS   Left Ventricle: Left ventricular ejection fraction, by estimation, is 55  to 60%. The left ventricle has normal function. The left ventricle  demonstrates regional wall motion abnormalities. The left ventricular  internal cavity size was normal in size.  There is no left ventricular hypertrophy.     LV Wall Scoring:  The mid inferoseptal segment and basal inferior segment are hypokinetic.  The  entire anterior wall, entire lateral wall, entire anterior septum, entire  apex, mid and distal inferior wall, and basal inferoseptal segment are  normal.   Right Ventricle: The right ventricular size is normal. No increase in  right ventricular wall thickness. Right ventricular systolic function is  normal.   Pericardium: A small pericardial effusion is present. The pericardial  effusion is circumferential. Presence of pericardial fat pad.   Aorta: The aortic root is normal in size and structure.   Venous: The inferior vena cava is normal in size with greater than 50%  respiratory variability, suggesting right atrial pressure of 3 mmHg.    EKG:  EKG is not ordered today.    Recent Labs: 10/14/2021: Magnesium 2.1 04/21/2022: ALT 13 07/01/2022: B Natriuretic Peptide 72.0; BUN 21; Creatinine, Ser 0.85; Hemoglobin 8.4; Platelets 324; Potassium 3.7; Sodium 137  Recent Lipid Panel    Component Value Date/Time   CHOL 211  (H) 07/29/2020 0236   TRIG 126 08/21/2021 1402   HDL 49 08/21/2021 1402   CHOLHDL 9.6 07/29/2020 0236   VLDL 37 07/29/2020 0236   LDLCALC 152 (H) 07/29/2020 0236    Risk Assessment/Calculations:   CHA2DS2-VASc Score = 3   This indicates a 3.2% annual risk of stroke. The patient's score is based upon: CHF History: 1 HTN History: 0 Diabetes History: 0 Stroke History: 0 Vascular Disease History: 1 Age Score: 0 Gender Score: 1     Home Medications   Current Meds  Medication Sig   apixaban (ELIQUIS) 5 MG TABS tablet TAKE (1) TABLET BY MOUTH TWICE DAILY. (Patient taking differently: Take 5 mg by mouth 2 (two) times daily.)   aspirin EC 81 MG tablet Take 81 mg by mouth daily. Swallow whole.   atorvastatin (LIPITOR) 80 MG tablet Take 1 tablet (80 mg total) by mouth  daily.   dapagliflozin propanediol (FARXIGA) 10 MG TABS tablet TAKE 1 TABLET BY MOUTH ONCE A DAY.   diclofenac Sodium (VOLTAREN) 1 % GEL Apply topically.   dronedarone (MULTAQ) 400 MG tablet Take 1 tablet (400 mg total) by mouth 2 (two) times daily with a meal.   ENTRESTO 24-26 MG TAKE 1 TABLET BY MOUTH TWICE A DAY   folic acid (FOLVITE) 1 MG tablet Take 1 tablet (1 mg total) by mouth daily.   furosemide (LASIX) 40 MG tablet Take 40 mg by mouth twice daily for 5 days then take 40 mg by mouth daily thereafter   HYDROcodone-acetaminophen (NORCO) 10-325 MG tablet Take 1-2 tablets by mouth every 6 (six) hours as needed.   metoprolol succinate (TOPROL-XL) 25 MG 24 hr tablet TAKE 1/2 TABLET BY MOUTH ONCE DAILY.   potassium chloride SA (KLOR-CON M) 20 MEQ tablet Take 20 meq by mouth twice daily for 5 days then take 20 meq by mouth daily thereafter   [DISCONTINUED] furosemide (LASIX) 20 MG tablet TAKE (1) TABLET BY MOUTH ONCE DAILY. TAKE AN ADDITIONAL 1 TABLET FOR 3 POUND WEIGHT GAIN OVERNIGHT OR 5 POUNDS IN A WEEK. (Patient taking differently: Take 20 mg by mouth See admin instructions. Take 1 tablet by mouth once daily. Take an  additional 1 tablet for 3 pound weight gain overnight or 5 pounds in a week.)     Review of Systems      All other systems reviewed and are otherwise negative except as noted above.  Physical Exam    VS:  BP 136/88   Pulse 70   Ht 5' 1"$  (1.549 m)   Wt 238 lb (108 kg)   LMP 06/27/2017 (Within Weeks)   SpO2 96%   BMI 44.97 kg/m  , BMI Body mass index is 44.97 kg/m.  Wt Readings from Last 3 Encounters:  07/07/22 238 lb (108 kg)  07/01/22 256 lb 12.8 oz (116.5 kg)  06/24/22 236 lb (107 kg)     GEN: Well nourished, well developed, in no acute distress. HEENT: normal. Neck: Supple, no JVD, carotid bruits, or masses. Cardiac:RRR, no murmurs, rubs, or gallops. No clubbing, cyanosis, 2+ pitting bilateral edema.  Radials/PT 2+ and equal bilaterally.  Respiratory:  Respirations regular and unlabored, clear to auscultation bilaterally. GI: Soft, nontender, nondistended. MS: No deformity or atrophy. Skin: Warm and dry, no rash. Neuro:  Strength and sensation are intact. Psych: Normal affect.  Assessment & Plan    LE edema/CHF exacerbation -2+ pitting edema today on exam -recent labs with normal renal function -echo 11/2020 with recovered LVEF 55-60% -will repeat echo  -increase lasix to 34m BID x 5 days, then reduce to 455mdaily -repeat BMP and BNP in 1 week -elevate legs when able -[place] elastic therapy for compression -daily weights -low sodium diet  CAD s/p PCI -Continue current medications including Eliquis 5 mg twice a day, aspirin 81 mg daily, Lipitor 80 mg daily, Farxiga 10 mg daily, Multaq 400 mg twice a day, Lasix (increased to 40 mg twice daily x 5 days and then 40 mg daily), metoprolol succinate 12 and half milligrams daily, Entresto 24-26 mg twice a day, added potassium supplementation 20 mEq twice daily and then 20 mEq daily  Afib with RVR -Normal sinus rhythm today on exam -Continue Eliquis and Multaq  Hyperlipidemia -LDL 68 (08/21/2021), at goal          Disposition: Follow up 3 months  with PaDorris CarnesMD or APP.  Signed, TeJohann Capers  Doroteo Glassman, PA-C 07/07/2022, 2:57 PM [facility]

## 2022-07-07 NOTE — Patient Instructions (Signed)
Medication Instructions:  1.Increase lasix to 40 mg twice daily for 5 days then take 40 mg daily thereafter 2.Start potassium to 20 meq twice daily for 5 days then take 20 meq daily thereafter *If you need a refill on your cardiac medications before your next appointment, please call your pharmacy*   Lab Work: BMP and BNP in 2 weeks If you have labs (blood work) drawn today and your tests are completely normal, you will receive your results only by: [place] (if you have MyChart) OR A paper copy in the mail If you have any lab test that is abnormal or we need to change your treatment, we will call you to review the results.   Testing/Procedures: Your physician has requested that you have an echocardiogram. Echocardiography is a painless test that uses sound waves to create images of your heart. It provides your doctor with information about the size and shape of your heart and how well your heart's chambers and valves are working. This procedure takes approximately one hour. There are no restrictions for this procedure. Please do NOT wear cologne, perfume, aftershave, or lotions (deodorant is allowed). Please arrive 15 minutes prior to your appointment time.    Follow-Up: At [facility], you and your health needs are our priority.  As part of our continuing mission to provide you with exceptional heart care, we have created designated Provider Care Teams.  These Care Teams include your primary Cardiologist (physician) and Advanced Practice Providers (APPs -  Physician Assistants and Nurse Practitioners) who all work together to provide you with the care you need, when you need it.   Your next appointment:   3 month(s)  Provider:   Dorris Carnes, MD    Other Instructions Elevate legs when able Weigh every morning after using the restroom before breakfast and call and let us know if you have a weight gain of 2 lbs or more overnight or 5 lbs or more in a week

## 2022-07-08 ENCOUNTER — Ambulatory Visit: Payer: Self-pay | Admitting: Internal Medicine

## 2022-07-12 NOTE — Progress Notes (Deleted)
Office Visit Note  Patient: Kristine Montgomery             Date of Birth: [birth year]           MRN: [id number]             PCP: Redmond School, MD Referring: Redmond School, MD Visit Date: 07/13/2022   Subjective:  No chief complaint on file.   History of Present Illness: Kristine Montgomery is a 57 y.o. female here for follow up ***   Previous HPI 04/21/22 Kristine Montgomery is a 57 y.o. female here for evaluation for rheumatoid arthritis with joint pain and synovitis with highly positive RF and ESR. She has longstanding arthritis in multiple areas worst has been left knee pain with effusions, MRI in 11/2019 with medial meniscus tear and tricompartmental OA worst medially.  The more recent issue is having episodic flareups of pain and swelling involving fingers and wrist of both hands mostly for about the past 4 months.  She has received several prednisone tapers for a week at a time with her PCP Dr. Riley Kill these are very helpful but symptoms return once discontinuing steroids.  She did not recall inflammation or swelling like this until this past year.  She also newly developed inflammation at the foot and ankle that started during hospitalization for her heart issues attributed to gouty arthritis.  She has not been on any longstanding gout medicine and has not been seeing flareups in the foot and ankle along with these hands changes.   Today she also has an injury on the left elbow sustained in the parking lot coming to this clinic appointment.  Before that she has not had any recent falls at least for greater than the past year.  Her walking is significantly impaired with decreased range of motion in her bilateral knees has been attributed to the osteoarthritis and probably some contracture at this point which was a concern for surgical repair from her orthopedist Dr. Aline Brochure.     Labs reviewed 12/2021 ANA neg RF 482.3 ESR >140   No Rheumatology ROS completed.   PMFS History:  Patient Active  Problem List   Diagnosis Date Noted   Seropositive rheumatoid arthritis ([place]) 04/21/2022   High risk medication use 04/21/2022   Idiopathic gout of multiple sites 04/21/2022   Secondary hypercoagulable state ([place]) 10/14/2021   Paroxysmal atrial fibrillation ([place]) 03/23/2021   Hypokalemia 03/23/2021   Leukocytosis 03/23/2021   Thrombocytosis 03/23/2021   Elevated troponin 03/23/2021   Hyperglycemia 03/23/2021   Ischemic cardiomyopathy 07/31/2020   Coronary artery disease involving native coronary artery of native heart with unstable angina pectoris ([place]) 07/31/2020   Nocturnal hypoxemia due to obesity 07/31/2020   Morbid obesity ([place]) 07/31/2020   Tobacco abuse 07/31/2020   Acute combined systolic and diastolic CHF, NYHA class 4 ([place]) 07/29/2020   Mixed hyperlipidemia 07/29/2020   NSTEMI (non-ST elevated myocardial infarction) ([place]) 07/29/2020   Unstable angina ([place]) 07/28/2020   S/P right knee arthroscopy 05/23/20 05/28/2020   Tear of meniscus of knee joint    Synovitis of knee    Primary osteoarthritis of right knee    Encounter for screening fecal occult blood testing 04/09/2020   Encounter for gynecological examination with Papanicolaou smear of cervix 04/09/2020   Urinary tract infection without hematuria 02/27/2020   Dysuria 02/27/2020   Urge incontinence 04/10/2018   OAB (overactive bladder) 04/10/2018   Urinary frequency 04/10/2018   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 EF 30% by echo in 07/2020 b. EF 55-60% by echo in 11/2020   Arthritis    Asthma    CAD (coronary artery disease)    a. 07/2020: s/p NSTEMI with DES to proxLAD and DES to proxLCx with medical management recommended of the occluded RCA.   Chronic bronchitis (HCC)    COPD (chronic obstructive pulmonary disease) (HCC)    Edema extremities    History of hiatal hernia     Hyperlipidemia LDL goal <70 07/29/2020   Ovarian cyst    Pneumonia    Rheumatoid arthritis ([place])     Family History  Problem Relation Age of Onset   Cancer Paternal Grandfather        colon   Cancer Maternal Grandmother        lung   COPD Father    Non-Hodgkin's lymphoma Mother    Diabetes Mother    Cancer Mother        cervical   Congestive Heart Failure Mother    Hypertension Sister    Other Son        fluid around heart   Past Surgical History:  Procedure Laterality Date   CESAREAN SECTION     CORONARY STENT INTERVENTION N/A 07/29/2020   Procedure: CORONARY STENT INTERVENTION;  Surgeon: Troy Sine, MD;  Location: [facility];  Service: Cardiovascular;  Laterality: N/A;   KNEE ARTHROSCOPY WITH MEDIAL MENISECTOMY Right 05/23/2020   Procedure: KNEE ARTHROSCOPY WITH MEDIAL AND LATERAL MENISECTOMY; 3 COMPARTMENT SYNOVECTOMY;  Surgeon: Carole Civil, MD;  Location: [facility];  Service: Orthopedics;  Laterality: Right;   RIGHT/LEFT HEART CATH AND CORONARY ANGIOGRAPHY N/A 07/29/2020   Procedure: RIGHT/LEFT HEART CATH AND CORONARY ANGIOGRAPHY;  Surgeon: Troy Sine, MD;  Location: [facility];  Service: Cardiovascular;  Laterality: N/A;   TONSILLECTOMY     TUBAL LIGATION     Social History   Social History Narrative   Not on file    There is no immunization history on file for this patient.   Objective: Vital Signs: LMP 06/27/2017 (Within Weeks)    Physical Exam   Musculoskeletal Exam: ***  CDAI Exam: CDAI Score: -- Patient Global: --; Provider Global: -- Swollen: --; Tender: -- Joint Exam 07/13/2022   No joint exam has been documented for this visit   There is currently no information documented on the homunculus. Go to the Rheumatology activity and complete the homunculus joint exam.  Investigation: No additional findings.  Imaging: DG Chest 2 View  Result Date: 07/01/2022 CLINICAL DATA:  Heart failure. EXAM: CHEST - 2 VIEW  COMPARISON:  06/24/2022. FINDINGS: 1052 hours. No focal airspace opacity. Stable mild cardiomegaly and mediastinal contours. No pleural effusion or pneumothorax. IMPRESSION: Mild cardiomegaly without evidence of acute cardiopulmonary disease. Electronically Signed   By: Emmit Alexanders M.D.   On: 07/01/2022 11:11   DG Ribs Unilateral W/Chest Right  Result Date: 06/24/2022 CLINICAL DATA:  Fall EXAM: RIGHT RIBS AND CHEST - 6 VIEW COMPARISON:  None Available. FINDINGS: Minimally displaced lateral right rib fracture. There is no evidence of pneumothorax or pleural effusion. Both lungs are clear. Heart size and mediastinal contours are within normal limits. IMPRESSION: Right seventh rib fracture.  No acute cardiopulmonary process. Electronically Signed   By: Sammie Bench M.D.   On: 06/24/2022 14:20    Recent Labs: Lab Results  Component Value Date   WBC 8.4 07/01/2022   HGB 8.4 (L) 07/01/2022   PLT 324 07/01/2022   NA 137 07/01/2022   K 3.7 07/01/2022   CL 104 07/01/2022   CO2 25 07/01/2022   GLUCOSE 112 (H) 07/01/2022   BUN 21 (H) 07/01/2022   CREATININE 0.85 07/01/2022   BILITOT 0.4 04/21/2022   ALKPHOS 89 06/02/2021   AST 11 04/21/2022   ALT 13 04/21/2022   PROT 7.1 04/21/2022   ALBUMIN 2.7 (L) 06/02/2021   CALCIUM 8.5 (L) 07/01/2022   GFRAA >60 10/28/2019   QFTBGOLDPLUS INDETERMINATE (A) 04/21/2022    Speciality Comments: No specialty comments available.  Procedures:  No procedures performed Allergies: Patient has no known allergies.   Assessment / Plan:     Visit Diagnoses: No diagnosis found.  ***  Orders: No orders of the defined types were placed in this encounter.  No orders of the defined types were placed in this encounter.    Follow-Up Instructions: No follow-ups on file.   Collier Salina, MD  Note - This record has been created using [employer].  Chart creation errors have been sought, but may not always  have been located. Such creation  errors do not reflect on  the standard of medical care.

## 2022-07-13 ENCOUNTER — Ambulatory Visit: Payer: Self-pay | Admitting: Internal Medicine

## 2022-07-15 ENCOUNTER — Encounter: Payer: Self-pay | Admitting: Radiology

## 2022-07-16 ENCOUNTER — Telehealth: Payer: Self-pay | Admitting: Internal Medicine

## 2022-07-16 DIAGNOSIS — Z79899 Other long term (current) drug therapy: Secondary | ICD-10-CM

## 2022-07-16 NOTE — Telephone Encounter (Signed)
I spoke with patient.She agrees to take Lasix 40 mg bid although she has to urinate a lot. I suggested she purchase some Depends like products ti help ease her mind with incontinence. She relies on transportation and cannot get labs drawn today.   She plans on coming to [facility] on Monday and get get labs then.   I reviewed with her again the instructions given to her by Patrick Jupiter today

## 2022-07-16 NOTE — Telephone Encounter (Signed)
Patient stated that leg and feet were swollen to the point where she cannot stand/walk on them. On a pain scale of 1-10 for her legs, pt rates pain at a 8. Pt states she has no swelling anywhere else. Pt stated that she was talking Lasix 40 mg tablets BID, but decreased to 1 tablet as she is having trouble making it to the bathroom.

## 2022-07-16 NOTE — Telephone Encounter (Signed)
Patient calling back for update. Please advise

## 2022-07-16 NOTE — Telephone Encounter (Signed)
Pt c/o swelling: STAT is pt has developed SOB within 24 hours  How much weight have you gained and in what time span? N/a  If swelling, where is the swelling located? Legs and feet  Are you currently taking a fluid pill? Yes  Are you currently SOB? No  Do you have a log of your daily weights (if so, list)? No  Have you gained 3 pounds in a day or 5 pounds in a week? N/a  Have you traveled recently? No   Pt states that swelling has continued despite ER visit and well as office visit. She said swelling has gotten to the point to where she can't put on pants or walk to make it to the bathroom. Pt would like a callback regarding this matter. Please advise.

## 2022-07-20 ENCOUNTER — Emergency Department (HOSPITAL_COMMUNITY)
Admission: EM | Admit: 2022-07-20 | Discharge: 2022-07-20 | Disposition: A | Discharge status: Home / Self Care | Payer: 59 | Attending: Emergency Medicine | Admitting: Emergency Medicine

## 2022-07-20 ENCOUNTER — Other Ambulatory Visit: Payer: Self-pay

## 2022-07-20 DIAGNOSIS — J449 Chronic obstructive pulmonary disease, unspecified: Secondary | ICD-10-CM | POA: Diagnosis not present

## 2022-07-20 DIAGNOSIS — Z7982 Long term (current) use of aspirin: Secondary | ICD-10-CM | POA: Insufficient documentation

## 2022-07-20 DIAGNOSIS — I5031 Acute diastolic (congestive) heart failure: Secondary | ICD-10-CM | POA: Insufficient documentation

## 2022-07-20 DIAGNOSIS — I5021 Acute systolic (congestive) heart failure: Secondary | ICD-10-CM | POA: Insufficient documentation

## 2022-07-20 DIAGNOSIS — M7989 Other specified soft tissue disorders: Secondary | ICD-10-CM | POA: Diagnosis present

## 2022-07-20 DIAGNOSIS — Z7901 Long term (current) use of anticoagulants: Secondary | ICD-10-CM | POA: Insufficient documentation

## 2022-07-20 DIAGNOSIS — R7989 Other specified abnormal findings of blood chemistry: Secondary | ICD-10-CM | POA: Diagnosis not present

## 2022-07-20 DIAGNOSIS — R600 Localized edema: Secondary | ICD-10-CM | POA: Diagnosis not present

## 2022-07-20 DIAGNOSIS — I2510 Atherosclerotic heart disease of native coronary artery without angina pectoris: Secondary | ICD-10-CM | POA: Diagnosis not present

## 2022-07-20 DIAGNOSIS — R609 Edema, unspecified: Secondary | ICD-10-CM | POA: Diagnosis not present

## 2022-07-20 DIAGNOSIS — Z743 Need for continuous supervision: Secondary | ICD-10-CM | POA: Diagnosis not present

## 2022-07-20 LAB — CBC
HCT: 25.7 % — ABNORMAL LOW (ref 36.0–46.0)
Hemoglobin: 7.6 g/dL — ABNORMAL LOW (ref 12.0–15.0)
MCH: 25.3 pg — ABNORMAL LOW (ref 26.0–34.0)
MCHC: 29.6 g/dL — ABNORMAL LOW (ref 30.0–36.0)
MCV: 85.7 fL (ref 80.0–100.0)
Platelets: 448 10*3/uL — ABNORMAL HIGH (ref 150–400)
RBC: 3 MIL/uL — ABNORMAL LOW (ref 3.87–5.11)
RDW: 16.9 % — ABNORMAL HIGH (ref 11.5–15.5)
WBC: 9 10*3/uL (ref 4.0–10.5)
nRBC: 0 % (ref 0.0–0.2)

## 2022-07-20 LAB — POC OCCULT BLOOD, ED: Fecal Occult Bld: NEGATIVE

## 2022-07-20 LAB — IRON AND TIBC
Iron: 18 ug/dL — ABNORMAL LOW (ref 28–170)
Saturation Ratios: 9 % — ABNORMAL LOW (ref 10.4–31.8)
TIBC: 202 ug/dL — ABNORMAL LOW (ref 250–450)
UIBC: 184 ug/dL

## 2022-07-20 LAB — BASIC METABOLIC PANEL
Anion gap: 9 (ref 5–15)
BUN: 31 mg/dL — ABNORMAL HIGH (ref 6–20)
CO2: 25 mmol/L (ref 22–32)
Calcium: 8.3 mg/dL — ABNORMAL LOW (ref 8.9–10.3)
Chloride: 103 mmol/L (ref 98–111)
Creatinine, Ser: 1.23 mg/dL — ABNORMAL HIGH (ref 0.44–1.00)
GFR, Estimated: 51 mL/min — ABNORMAL LOW (ref >60)
Glucose, Bld: 124 mg/dL — ABNORMAL HIGH (ref 70–99)
Potassium: 3.3 mmol/L — ABNORMAL LOW (ref 3.5–5.1)
Sodium: 137 mmol/L (ref 135–145)

## 2022-07-20 LAB — FERRITIN: Ferritin: 297 ng/mL (ref 11–307)

## 2022-07-20 LAB — BRAIN NATRIURETIC PEPTIDE: B Natriuretic Peptide: 37 pg/mL (ref 0.0–100.0)

## 2022-07-20 MED ORDER — ZINC OXIDE 20 % EX OINT: 1.0000 | TOPICAL_OINTMENT | CUTANEOUS | 0 refills | Status: DC | PRN

## 2022-07-20 MED ORDER — POTASSIUM CHLORIDE CRYS ER 20 MEQ PO TBCR
40.0000 meq | EXTENDED_RELEASE_TABLET | Freq: Once | ORAL | Status: AC
  Administered 2022-07-20: 40 meq via ORAL
  Filled 2022-07-20: qty 2

## 2022-07-20 MED ORDER — FUROSEMIDE 10 MG/ML IJ SOLN
40.0000 mg | Freq: Once | INTRAMUSCULAR | Status: AC
  Administered 2022-07-20: 40 mg via INTRAVENOUS
  Filled 2022-07-20: qty 4

## 2022-07-20 NOTE — ED Notes (Signed)
Pt states her son will come after her in approx an hour

## 2022-07-20 NOTE — ED Notes (Signed)
Occult card negative. PA notified.

## 2022-07-20 NOTE — Discharge Instructions (Signed)
Return to the ED with any new or worsening signs or symptoms such as chest pain, shortness of breath, lightheadedness, dizziness, weakness Please read attached guide concerning peripheral edema Please begin taking Lasix 40 mg twice daily for the next 5 days.  After 5 days, please reduce your Lasix to 40 mg once a day. Please follow-up with your PCP in the next 3 days for reevaluation/recheck of labs to include BMP, CBC Please follow-up with cardiologist concerning lower extremity swelling.  Please purchase compression stockings and apply these.  Please reduce salt intake. Please pick up zinc oxide cream that I sent to your pharmacy.  Please apply this to your pressure ulcer.  Please purchase donut cushion to relieve pressure on this area. Please read attached guide concerning pressure injuries

## 2022-07-20 NOTE — ED Notes (Signed)
Patient is resting comfortably. No complaints.  Provider aware of urine output

## 2022-07-20 NOTE — ED Provider Notes (Signed)
[facility] Provider Note   CSN: [id number] Arrival date & time: 07/20/22  1354     History  Chief Complaint  Patient presents with   Leg Swelling    Kristine Montgomery is a 57 y.o. 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  Patient presents to ED for evaluation of bilateral lower extremity swelling.  Patient reports she was initially seen for the same complaint on 07/01/2022.  At that time the patient was provided with 20 mg IV Lasix and diuresed to 1.5 L and then discharged with cardiology follow-up.  Patient reports that she followed up with cardiology and was advised to purchase compression stockings, decrease salt intake and increase her Lasix to 40 mg twice daily for 5 days and then back to her regular dose after 5 days of 40 mg daily.  Patient states that she has had no real significant relief of her lower extremity edema ever since being seen on 2/15.  Patient states that she is compliant on Lasix therapy 40 mg once a day.  Patient denies any chest pain, shortness of breath, nausea, vomiting, fevers, headache, sore throat, body aches or chills.  Patient denies any pain in her legs, reports she only has pain when she attempts to stand.  Patient states that she has been getting around using her walker as of late.    HPI     Home Medications Prior to Admission medications   Medication Sig Start Date End Date Taking? Authorizing Provider  apixaban (ELIQUIS) 5 MG TABS tablet TAKE (1) TABLET BY MOUTH TWICE DAILY. Patient taking differently: Take 5 mg by mouth 2 (two) times daily. 07/22/21  Yes Fay Records, MD  aspirin EC 81 MG tablet Take 81 mg by mouth daily. Swallow whole.   Yes [provider]  atorvastatin (LIPITOR) 80 MG tablet Take 1 tablet (80 mg total) by mouth daily. 04/22/22  Yes Fay Records, MD   dapagliflozin propanediol (FARXIGA) 10 MG TABS tablet TAKE 1 TABLET BY MOUTH ONCE A DAY. 04/22/22  Yes Fay Records, MD  diclofenac Sodium (VOLTAREN) 1 % GEL Apply topically. 04/28/22  Yes [provider]  dronedarone (MULTAQ) 400 MG tablet Take 1 tablet (400 mg total) by mouth 2 (two) times daily with a meal. 10/20/21  Yes Fenton, Clint R, PA  ENTRESTO 24-26 MG TAKE 1 TABLET BY MOUTH TWICE A DAY 12/23/21  Yes Fay Records, MD  famotidine (PEPCID) 40 MG tablet Take 1 tablet (40 mg total) by mouth every evening. 05/21/21 07/20/22 Yes Evans Lance, MD  folic acid (FOLVITE) 1 MG tablet Take 1 tablet (1 mg total) by mouth daily. 04/26/22  Yes Rice, Resa Miner, MD  furosemide (LASIX) 40 MG tablet Take 40 mg by mouth twice daily for 5 days then take 40 mg by mouth daily thereafter 07/07/22  Yes Harriet Pho, Tessa N, PA-C  HYDROcodone-acetaminophen (NORCO) 10-325 MG tablet Take 1-2 tablets by mouth every 6 (six) hours as needed. 10/27/21  Yes [provider]  meloxicam (MOBIC) 7.5 MG tablet Take 7.5 mg by mouth 2 (two) times daily. 06/23/22  Yes [provider]  metoprolol succinate (TOPROL-XL) 25 MG 24 hr tablet TAKE 1/2 TABLET BY MOUTH ONCE DAILY. 06/07/22  Yes Fay Records, MD  nitroGLYCERIN (NITROSTAT) 0.4 MG SL tablet PLACE 1 TABLET (0.4 MG TOTAL) UNDER THE TONGUE  EVERY FIVE MINUTES X 3 DOSES AS NEEDED FOR CHEST PAIN. Patient taking differently: Place 0.4 mg under the tongue every 5 (five) minutes as needed for chest pain. 07/31/20 07/20/22 Yes Bhagat, Bhavinkumar, PA  potassium chloride SA (KLOR-CON M) 20 MEQ tablet Take 20 meq by mouth twice daily for 5 days then take 20 meq by mouth daily thereafter 07/07/22  Yes Harriet Pho, Tessa N, PA-C  zinc oxide (MEIJER ZINC OXIDE) 20 % ointment Apply 1 Application topically as needed for irritation. 07/20/22  Yes Azucena Cecil, PA-C  methotrexate (RHEUMATREX) 2.5 MG tablet Take 6 tablets (15 mg total) by mouth once a week. Caution:Chemotherapy.  Protect from light. Patient not taking: Reported on 07/07/2022 04/26/22   Collier Salina, MD      Allergies    Patient has no known allergies.    Review of Systems   Review of Systems  Respiratory:  Negative for shortness of breath.   Cardiovascular:  Positive for leg swelling. Negative for chest pain.  Neurological:  Negative for dizziness, weakness and light-headedness.  All other systems reviewed and are negative.   Physical Exam Updated Vital Signs BP 109/84   Pulse 71   Temp 98.2 F (36.8 C)   Resp (!) 8   Ht '5\' 1"'$  (1.549 m)   Wt 108.9 kg   LMP 06/27/2017 (Within Weeks)   SpO2 99%   BMI 45.35 kg/m  Physical Exam Vitals and nursing note reviewed.  Constitutional:      General: She is not in acute distress.    Appearance: She is well-developed.  HENT:     Head: Normocephalic and atraumatic.     Mouth/Throat:     Mouth: Mucous membranes are moist.     Pharynx: Oropharynx is clear.  Eyes:     Conjunctiva/sclera: Conjunctivae normal.  Cardiovascular:     Rate and Rhythm: Normal rate and regular rhythm.     Heart sounds: No murmur heard. Pulmonary:     Effort: Pulmonary effort is normal. No respiratory distress.     Breath sounds: Normal breath sounds. No wheezing, rhonchi or rales.  Abdominal:     Palpations: Abdomen is soft.     Tenderness: There is no abdominal tenderness.  Musculoskeletal:        General: No swelling.     Cervical back: Neck supple.     Right lower leg: 2+ Pitting Edema present.     Left lower leg: 2+ Pitting Edema present.  Skin:    General: Skin is warm and dry.     Capillary Refill: Capillary refill takes less than 2 seconds.     Comments: Grade 2 pressure ulcer to gluteal cleft  Neurological:     General: No focal deficit present.     Mental Status: She is alert and oriented to person, place, and time.     GCS: GCS eye subscore is 4. GCS verbal subscore is 5. GCS motor subscore is 6.     Cranial Nerves: Cranial nerves 2-12 are  intact. No cranial nerve deficit.     Sensory: Sensation is intact. No sensory deficit.     Motor: Motor function is intact. No weakness.     Coordination: Coordination is intact. Heel to [facility] Test normal.  Psychiatric:        Mood and Affect: Mood normal.        ED Results / Procedures / Treatments   Labs (all labs ordered are listed, but only abnormal results are displayed) Labs  Reviewed  CBC - Abnormal; Notable for the following components:      Result Value   RBC 3.00 (*)    Hemoglobin 7.6 (*)    HCT 25.7 (*)    MCH 25.3 (*)    MCHC 29.6 (*)    RDW 16.9 (*)    Platelets 448 (*)    All other components within normal limits  BASIC METABOLIC PANEL - Abnormal; Notable for the following components:   Potassium 3.3 (*)    Glucose, Bld 124 (*)    BUN 31 (*)    Creatinine, Ser 1.23 (*)    Calcium 8.3 (*)    GFR, Estimated 51 (*)    All other components within normal limits  BRAIN NATRIURETIC PEPTIDE  IRON AND TIBC  FERRITIN  POC OCCULT BLOOD, ED    EKG EKG Interpretation  Date/Time:  Tuesday July 20 2022 15:20:48 EST Ventricular Rate:  68 PR Interval:  161 QRS Duration: 105 QT Interval:  425 QTC Calculation: 452 R Axis:   69 Text Interpretation: Sinus rhythm Low voltage, precordial leads Confirmed by Kommor, Madison ([id number]) on 07/20/2022 5:41:18 PM  Radiology No results found.  Procedures Procedures   Medications Ordered in ED Medications  furosemide (LASIX) injection 40 mg (40 mg Intravenous Given 07/20/22 1540)  potassium chloride SA (KLOR-CON M) CR tablet 40 mEq (40 mEq Oral Given 07/20/22 1831)    ED Course/ Medical Decision Making/ A&P  Medical Decision Making Amount and/or Complexity of Data Reviewed Labs: ordered.  Risk Prescription drug management.   57 year old female presents to ED for evaluation.  Please see HPI for further details.  On examination the patient is afebrile and nontachycardic.  Lung sounds are clear bilaterally, she is not  hypoxic.  Abdomen soft and compressible throughout.  Normal neurological examination.  Patient does have grade 2 pressure ulcer to gluteal cleft.  Patient workup will include CBC, BMP, BNP.  Will also collect EKG.  Will provide patient 40 mg Lasix for diuresis.  Patient CBC with no leukocytosis however does have hemoglobin of 7.6.  Patient denies any blood in stool, fluid loss.  Patient denies lightheadedness, dizziness, weakness, shortness of breath.  Patient hemoglobin 8.4  2 weeks ago.  Patient reports he does not blood thinner.  At this time, fecal occult blood card was ordered.  The patient fecal occult blood card is negative for any blood in stool.  Patient BMP with elevated creatinine 1.23 from a baseline of 0.85 2 weeks ago.  Patient potassium down to 3.3 repleted with 40 mEq of oral potassium.  EKG normal sinus rhythm.  Patient will be sent home with zinc oxide cream for pressure ulcer.  Patient encouraged to purchase donut cushion to relieve pressure on this area and encouraged to ambulate.  Patient advised to follow-up with PCP in 3 days for recheck of labs to ensure improved kidney function and improved hemoglobin.  The patient was advised to return to the ED with any new or worsening signs or symptoms such as chest pain, shortness of breath, lightheadedness, dizziness, weakness.  The patient voiced understanding of my instructions.  Patient will be advised to begin taking Lasix 40 mg twice daily for the next 5 days and then reduce to 40 mg once a day.  Patient reports that she just had her Lasix refilled, reports that she does have oral potassium at home to take.  Patient advised to follow-up with her cardiologist for reevaluation of lower extremity edema.  Patient voiced understanding.  Patient all her questions answered her satisfaction.  Patient stable for discharge.   Final Clinical Impression(s) / ED Diagnoses Final diagnoses:  Leg edema    Rx / DC Orders ED Discharge Orders           Ordered    zinc oxide [facility] ZINC OXIDE) 20 % ointment  As needed        07/20/22 1823              Lawana Chambers 07/20/22 1839    Carmin Muskrat, MD 07/21/22 [phone]

## 2022-07-20 NOTE — ED Triage Notes (Signed)
Increasing swelling on legs last month.  PCP increased lasix to '40mg'$  2 weeks ago but not helping.  Denies any SOB

## 2022-07-20 NOTE — ED Notes (Signed)
Patient transported to CT 

## 2022-07-20 NOTE — ED Notes (Signed)
Purwick applied for diuresis.  Cream applied to buttocks where pt has skin tear on right cheek

## 2022-07-22 ENCOUNTER — Other Ambulatory Visit: Payer: Self-pay | Admitting: Student

## 2022-07-22 NOTE — Telephone Encounter (Addendum)
Prescription refill request for Eliquis received. Indication: afib  Last office visit: 07/07/2022 Scr: 1.23, 07/20/2022 Age: 57 Weight: 108.9 kg   Refill sent.

## 2022-07-23 DIAGNOSIS — B372 Candidiasis of skin and nail: Secondary | ICD-10-CM | POA: Diagnosis not present

## 2022-07-23 DIAGNOSIS — I5041 Acute combined systolic (congestive) and diastolic (congestive) heart failure: Secondary | ICD-10-CM | POA: Diagnosis not present

## 2022-07-23 DIAGNOSIS — I200 Unstable angina: Secondary | ICD-10-CM | POA: Diagnosis not present

## 2022-07-23 DIAGNOSIS — I214 Non-ST elevation (NSTEMI) myocardial infarction: Secondary | ICD-10-CM | POA: Diagnosis not present

## 2022-08-09 ENCOUNTER — Ambulatory Visit (HOSPITAL_COMMUNITY)
Admission: RE | Admit: 2022-08-09 | Discharge: 2022-08-09 | Disposition: A | Discharge status: Home / Self Care | Payer: 59 | Source: Ambulatory Visit | Attending: Physician Assistant | Admitting: Physician Assistant

## 2022-08-09 ENCOUNTER — Other Ambulatory Visit (HOSPITAL_COMMUNITY)
Admission: RE | Admit: 2022-08-09 | Discharge: 2022-08-09 | Disposition: A | Discharge status: Home / Self Care | Payer: 59 | Source: Ambulatory Visit | Attending: Physician Assistant | Admitting: Physician Assistant

## 2022-08-09 DIAGNOSIS — Z79899 Other long term (current) drug therapy: Secondary | ICD-10-CM

## 2022-08-09 DIAGNOSIS — I5042 Chronic combined systolic (congestive) and diastolic (congestive) heart failure: Secondary | ICD-10-CM | POA: Diagnosis not present

## 2022-08-09 DIAGNOSIS — R600 Localized edema: Secondary | ICD-10-CM | POA: Diagnosis not present

## 2022-08-09 DIAGNOSIS — I5030 Unspecified diastolic (congestive) heart failure: Secondary | ICD-10-CM | POA: Diagnosis not present

## 2022-08-09 LAB — BASIC METABOLIC PANEL
Anion gap: 7 (ref 5–15)
BUN: 28 mg/dL — ABNORMAL HIGH (ref 6–20)
CO2: 25 mmol/L (ref 22–32)
Calcium: 8.5 mg/dL — ABNORMAL LOW (ref 8.9–10.3)
Chloride: 106 mmol/L (ref 98–111)
Creatinine, Ser: 0.95 mg/dL (ref 0.44–1.00)
GFR, Estimated: >60 mL/min (ref >60)
Glucose, Bld: 103 mg/dL — ABNORMAL HIGH (ref 70–99)
Potassium: 4.7 mmol/L (ref 3.5–5.1)
Sodium: 138 mmol/L (ref 135–145)

## 2022-08-09 LAB — BRAIN NATRIURETIC PEPTIDE: B Natriuretic Peptide: 172 pg/mL — ABNORMAL HIGH (ref 0.0–100.0)

## 2022-08-09 LAB — ECHOCARDIOGRAM COMPLETE
Area-P 1/2: 2.76 cm2
MV M vel: 4.81 m/s
MV Peak grad: 92.5 mmHg
S' Lateral: 3.5 cm

## 2022-08-09 NOTE — Progress Notes (Signed)
*  PRELIMINARY RESULTS* Echocardiogram 2D Echocardiogram has been performed.  Kristine Montgomery 08/09/2022, 9:27 AM

## 2022-08-13 ENCOUNTER — Ambulatory Visit: Payer: Medicaid Other | Admitting: Internal Medicine

## 2022-08-20 ENCOUNTER — Other Ambulatory Visit: Payer: Self-pay | Admitting: Internal Medicine

## 2022-09-02 ENCOUNTER — Emergency Department (HOSPITAL_COMMUNITY)
Admission: EM | Admit: 2022-09-02 | Discharge: 2022-09-02 | Disposition: A | Discharge status: Home / Self Care | Payer: 59 | Attending: Emergency Medicine | Admitting: Emergency Medicine

## 2022-09-02 ENCOUNTER — Other Ambulatory Visit: Payer: Self-pay

## 2022-09-02 ENCOUNTER — Encounter (HOSPITAL_COMMUNITY): Payer: Self-pay

## 2022-09-02 ENCOUNTER — Emergency Department (HOSPITAL_COMMUNITY): Payer: 59

## 2022-09-02 DIAGNOSIS — I5030 Unspecified diastolic (congestive) heart failure: Secondary | ICD-10-CM | POA: Insufficient documentation

## 2022-09-02 DIAGNOSIS — Z7982 Long term (current) use of aspirin: Secondary | ICD-10-CM | POA: Diagnosis not present

## 2022-09-02 DIAGNOSIS — J45909 Unspecified asthma, uncomplicated: Secondary | ICD-10-CM | POA: Diagnosis not present

## 2022-09-02 DIAGNOSIS — Z7901 Long term (current) use of anticoagulants: Secondary | ICD-10-CM | POA: Diagnosis not present

## 2022-09-02 DIAGNOSIS — R609 Edema, unspecified: Secondary | ICD-10-CM | POA: Diagnosis not present

## 2022-09-02 DIAGNOSIS — Z8719 Personal history of other diseases of the digestive system: Secondary | ICD-10-CM | POA: Diagnosis not present

## 2022-09-02 DIAGNOSIS — M79671 Pain in right foot: Secondary | ICD-10-CM

## 2022-09-02 DIAGNOSIS — R059 Cough, unspecified: Secondary | ICD-10-CM | POA: Diagnosis not present

## 2022-09-02 DIAGNOSIS — R471 Dysarthria and anarthria: Secondary | ICD-10-CM | POA: Diagnosis not present

## 2022-09-02 DIAGNOSIS — I2510 Atherosclerotic heart disease of native coronary artery without angina pectoris: Secondary | ICD-10-CM | POA: Insufficient documentation

## 2022-09-02 DIAGNOSIS — M79672 Pain in left foot: Secondary | ICD-10-CM | POA: Diagnosis not present

## 2022-09-02 DIAGNOSIS — Z8249 Family history of ischemic heart disease and other diseases of the circulatory system: Secondary | ICD-10-CM | POA: Diagnosis not present

## 2022-09-02 DIAGNOSIS — J449 Chronic obstructive pulmonary disease, unspecified: Secondary | ICD-10-CM | POA: Insufficient documentation

## 2022-09-02 DIAGNOSIS — R2243 Localized swelling, mass and lump, lower limb, bilateral: Secondary | ICD-10-CM | POA: Diagnosis not present

## 2022-09-02 DIAGNOSIS — Z8673 Personal history of transient ischemic attack (TIA), and cerebral infarction without residual deficits: Secondary | ICD-10-CM | POA: Diagnosis not present

## 2022-09-02 DIAGNOSIS — R600 Localized edema: Secondary | ICD-10-CM | POA: Insufficient documentation

## 2022-09-02 DIAGNOSIS — J8410 Pulmonary fibrosis, unspecified: Secondary | ICD-10-CM | POA: Diagnosis not present

## 2022-09-02 DIAGNOSIS — R112 Nausea with vomiting, unspecified: Secondary | ICD-10-CM | POA: Diagnosis not present

## 2022-09-02 DIAGNOSIS — Z743 Need for continuous supervision: Secondary | ICD-10-CM | POA: Diagnosis not present

## 2022-09-02 DIAGNOSIS — I4891 Unspecified atrial fibrillation: Secondary | ICD-10-CM | POA: Diagnosis not present

## 2022-09-02 DIAGNOSIS — I509 Heart failure, unspecified: Secondary | ICD-10-CM | POA: Diagnosis not present

## 2022-09-02 LAB — CBC
HCT: 30.3 % — ABNORMAL LOW (ref 36.0–46.0)
Hemoglobin: 9.1 g/dL — ABNORMAL LOW (ref 12.0–15.0)
MCH: 25.6 pg — ABNORMAL LOW (ref 26.0–34.0)
MCHC: 30 g/dL (ref 30.0–36.0)
MCV: 85.1 fL (ref 80.0–100.0)
Platelets: 321 10*3/uL (ref 150–400)
RBC: 3.56 MIL/uL — ABNORMAL LOW (ref 3.87–5.11)
RDW: 18.2 % — ABNORMAL HIGH (ref 11.5–15.5)
WBC: 9.6 10*3/uL (ref 4.0–10.5)
nRBC: 0 % (ref 0.0–0.2)

## 2022-09-02 LAB — BASIC METABOLIC PANEL
Anion gap: 9 (ref 5–15)
BUN: 15 mg/dL (ref 6–20)
CO2: 24 mmol/L (ref 22–32)
Calcium: 8.6 mg/dL — ABNORMAL LOW (ref 8.9–10.3)
Chloride: 102 mmol/L (ref 98–111)
Creatinine, Ser: 0.74 mg/dL (ref 0.44–1.00)
GFR, Estimated: >60 mL/min (ref >60)
Glucose, Bld: 91 mg/dL (ref 70–99)
Potassium: 3.5 mmol/L (ref 3.5–5.1)
Sodium: 135 mmol/L (ref 135–145)

## 2022-09-02 LAB — URINALYSIS, ROUTINE W REFLEX MICROSCOPIC
Bilirubin Urine: NEGATIVE
Glucose, UA: >=500 mg/dL — AB
Ketones, ur: NEGATIVE mg/dL
Leukocytes,Ua: NEGATIVE
Nitrite: NEGATIVE
Protein, ur: NEGATIVE mg/dL
Specific Gravity, Urine: 1.026 (ref 1.005–1.030)
pH: 5 (ref 5.0–8.0)

## 2022-09-02 MED ORDER — HYDROCODONE-ACETAMINOPHEN 10-325 MG PO TABS
1.0000 | ORAL_TABLET | Freq: Once | ORAL | Status: AC
  Administered 2022-09-02: 1 via ORAL
  Filled 2022-09-02: qty 1

## 2022-09-02 MED ORDER — PREDNISONE 20 MG PO TABS: 40.0000 mg | ORAL_TABLET | Freq: Every day | ORAL | 0 refills | Status: DC

## 2022-09-02 MED ORDER — FUROSEMIDE 10 MG/ML IJ SOLN
40.0000 mg | Freq: Once | INTRAMUSCULAR | Status: AC
  Administered 2022-09-02: 40 mg via INTRAVENOUS
  Filled 2022-09-02: qty 4

## 2022-09-02 NOTE — ED Provider Triage Note (Signed)
Emergency Medicine Provider Triage Evaluation Note  Kristine Montgomery , a 57 y.o. female  was evaluated in triage.  Pt complains of leg, swelling, foot swelling, urine discoloration.  Patient states she noticed foot swelling in her bilateral feet a few days ago.  This morning it was much more swollen and it is hard for her to walk.  Patient supposed to be on Lasix but ran out of her medication.  She is not having any difficulty breathing.  Patient states she has a burning discomfort in her feet.  She also has noticed a discoloration in her urine.  Patient states she also have arthritis issues in her hands.  Review of Systems  Positive: Foot swelling Negative: Shortness of breath  Physical Exam  BP 101/80 (BP Location: Right Arm)   Pulse 75   Temp 98.2 F (36.8 C) (Oral)   Resp 18   Ht 1.549 m ( )   Wt 108 kg   LMP 06/27/2017 (Within Weeks)   SpO2 98%   BMI 44.97 kg/m  Gen:   Awake, no distress   Resp:  Normal effort  MSK:   Edema noted bilateral feet,  Other:    Medical Decision Making  Medically screening exam initiated at 2:29 PM.  Appropriate orders placed.  Kristine Montgomery was informed that the remainder of the evaluation will be completed by another provider, this initial triage assessment does not replace that evaluation, and the importance of remaining in the ED until their evaluation is complete.     Kristine Dibbles, MD 09/02/22 1430

## 2022-09-02 NOTE — ED Provider Notes (Signed)
[place] EMERGENCY DEPARTMENT AT [facility] Provider Note   CSN: [id number] Arrival date & time: 09/02/22  1249     History  Chief Complaint  Patient presents with   Leg Swelling    Kristine Montgomery is a 57 y.o. female.  Patient with h/o HFpEF, atrial fibrillation, asthma, CAD, chronic bronchitis, COPD, edema of extremities, rheumatoid arthritis -- presents to the emergency department today for evaluation of bilateral foot swelling and burning pain in her feet.  She has had several visits both in the emergency department and with her cardiologist over the past several months for similar symptoms.  She has been on up to 40 mg of Lasix twice daily.  She states that she has had increasing swelling since running out of furosemide about 3 days ago.  She has a prescription, but has yet to pick it up.  No chest pain or shortness of breath.  The burning pain in her feet has been gradually worsening.  She states that it is worse when her feet are on the floor.  She also reports increased urination with dark urine.  No back pain or vomiting.  Patient had grade 1 diastolic dysfunction on echo performed about 4 weeks ago.  She denies history of neuropathy.  She has been prescribed compression stockings with had these done.       Home Medications Prior to Admission medications   Medication Sig Start Date End Date Taking? Authorizing Provider  apixaban (ELIQUIS) 5 MG TABS tablet TAKE (1) TABLET BY MOUTH TWICE DAILY. 07/22/22   Strader, Lennart Pall, PA-C  aspirin EC 81 MG tablet Take 81 mg by mouth daily. Swallow whole.    [provider]  atorvastatin (LIPITOR) 80 MG tablet TAKE (1) TABLET BY MOUTH ONCE DAILY AT 6 PM 08/20/22   Pricilla Riffle, MD  dapagliflozin propanediol (FARXIGA) 10 MG TABS tablet TAKE 1 TABLET BY MOUTH ONCE A DAY. 04/22/22   Pricilla Riffle, MD  diclofenac Sodium (VOLTAREN) 1 % GEL Apply topically. 04/28/22   [provider]  dronedarone (MULTAQ) 400 MG  tablet Take 1 tablet (400 mg total) by mouth 2 (two) times daily with a meal. 10/20/21   Fenton, Grant R, PA  ENTRESTO 24-26 MG TAKE 1 TABLET BY MOUTH TWICE A DAY 12/23/21   Pricilla Riffle, MD  famotidine (PEPCID) 40 MG tablet TAKE ONE TABLET BY MOUTH IN THE EVENING. 08/20/22   Marinus Maw, MD  folic acid (FOLVITE) 1 MG tablet Take 1 tablet (1 mg total) by mouth daily. 04/26/22   Rice, Jamesetta Orleans, MD  furosemide (LASIX) 40 MG tablet Take 40 mg by mouth twice daily for 5 days then take 40 mg by mouth daily thereafter 07/07/22   Sharlene Dory, PA-C  HYDROcodone-acetaminophen [facility]) 10-325 MG tablet Take 1-2 tablets by mouth every 6 (six) hours as needed. 10/27/21   [provider]  meloxicam (MOBIC) 7.5 MG tablet Take 7.5 mg by mouth 2 (two) times daily. 06/23/22   [provider]  methotrexate (RHEUMATREX) 2.5 MG tablet Take 6 tablets (15 mg total) by mouth once a week. Caution:Chemotherapy. Protect from light. Patient not taking: Reported on 07/07/2022 04/26/22   Fuller Plan, MD  metoprolol succinate (TOPROL-XL) 25 MG 24 hr tablet TAKE 1/2 TABLET BY MOUTH ONCE DAILY. 06/07/22   Pricilla Riffle, MD  nitroGLYCERIN (NITROSTAT) 0.4 MG SL tablet PLACE 1 TABLET (0.4 MG TOTAL) UNDER THE TONGUE EVERY FIVE MINUTES X 3 DOSES  AS NEEDED FOR CHEST PAIN. Patient taking differently: Place 0.4 mg under the tongue every 5 (five) minutes as needed for chest pain. 07/31/20 07/20/22  Manson Passey, PA  potassium chloride SA (KLOR-CON M) 20 MEQ tablet Take 20 meq by mouth twice daily for 5 days then take 20 meq by mouth daily thereafter 07/07/22   Sharlene Dory, PA-C  zinc oxide [facility] ZINC OXIDE) 20 % ointment Apply 1 Application topically as needed for irritation. 07/20/22   Al Decant, PA-C      Allergies    Patient has no known allergies.    Review of Systems   Review of Systems  Physical Exam Updated Vital Signs BP 101/80 (BP Location: Right Arm)   Pulse 75   Temp 98.2 F  (36.8 C) (Oral)   Resp 18   Ht  (1.549 m)   Wt 108 kg   LMP 06/27/2017 (Within Weeks)   SpO2 98%   BMI 44.97 kg/m   Physical Exam Vitals and nursing note reviewed.  Constitutional:      Appearance: She is well-developed. She is not diaphoretic.  HENT:     Head: Normocephalic and atraumatic.     Mouth/Throat:     Mouth: Mucous membranes are not dry.  Eyes:     Conjunctiva/sclera: Conjunctivae normal.  Neck:     Vascular: Normal carotid pulses. No JVD.     Trachea: Trachea normal. No tracheal deviation.  Cardiovascular:     Rate and Rhythm: Normal rate and regular rhythm.     Pulses: No decreased pulses.          Radial pulses are 2+ on the right side and 2+ on the left side.     Heart sounds: Normal heart sounds, S1 normal and S2 normal. No murmur heard. Pulmonary:     Effort: Pulmonary effort is normal. No respiratory distress.     Breath sounds: No wheezing.  Chest:     Chest wall: No tenderness.  Abdominal:     General: Bowel sounds are normal.     Palpations: Abdomen is soft.     Tenderness: There is no abdominal tenderness. There is no guarding or rebound.  Musculoskeletal:        General: Normal range of motion.     Cervical back: Normal range of motion and neck supple. No muscular tenderness.     Right lower leg: Edema present.     Left lower leg: Edema present.     Comments: Patient with bilateral lower extremity edema, pitting confined to her feet.  Distal sensation intact but states that palpation worsens the burning sensation.  Skin:    General: Skin is warm and dry.     Coloration: Skin is not pale.  Neurological:     Mental Status: She is alert.     ED Results / Procedures / Treatments   Labs (all labs ordered are listed, but only abnormal results are displayed) Labs Reviewed  CBC - Abnormal; Notable for the following components:      Result Value   RBC 3.56 (*)    Hemoglobin 9.1 (*)    HCT 30.3 (*)    MCH 25.6 (*)    RDW 18.2 (*)    All  other components within normal limits  BASIC METABOLIC PANEL - Abnormal; Notable for the following components:   Calcium 8.6 (*)    All other components within normal limits  URINALYSIS, ROUTINE W REFLEX MICROSCOPIC - Abnormal; Notable for the  following components:   APPearance HAZY (*)    Glucose, UA >=500 (*)    Hgb urine dipstick SMALL (*)    Bacteria, UA MANY (*)    All other components within normal limits    EKG None  Radiology US Venous Img Lower Bilateral (DVT)  Result Date: 09/02/2022 CLINICAL DATA:  Bilateral lower extremity edema. EXAM: BILATERAL LOWER EXTREMITY VENOUS DOPPLER ULTRASOUND TECHNIQUE: Gray-scale sonography with graded compression, as well as color Doppler and duplex ultrasound were performed to evaluate the lower extremity deep venous systems from the level of the common femoral vein and including the common femoral, femoral, profunda femoral, popliteal and calf veins including the posterior tibial, peroneal and gastrocnemius veins when visible. The superficial great saphenous vein was also interrogated. Spectral Doppler was utilized to evaluate flow at rest and with distal augmentation maneuvers in the common femoral, femoral and popliteal veins. COMPARISON:  12/14/2019 FINDINGS: RIGHT LOWER EXTREMITY Common Femoral Vein: No evidence of thrombus. Normal compressibility, respiratory phasicity and response to augmentation. Saphenofemoral Junction: No evidence of thrombus. Normal compressibility and flow on color Doppler imaging. Profunda Femoral Vein: No evidence of thrombus. Normal compressibility and flow on color Doppler imaging. Femoral Vein: No evidence of thrombus. Normal compressibility, respiratory phasicity and response to augmentation. Popliteal Vein: No evidence of thrombus. Normal compressibility, respiratory phasicity and response to augmentation. Calf Veins: No evidence of thrombus. Normal compressibility and flow on color Doppler imaging. Superficial Great  Saphenous Vein: No evidence of thrombus. Normal compressibility. Venous Reflux:  None. Other Findings: No evidence of superficial thrombophlebitis or abnormal fluid collection. LEFT LOWER EXTREMITY Common Femoral Vein: No evidence of thrombus. Normal compressibility, respiratory phasicity and response to augmentation. Saphenofemoral Junction: No evidence of thrombus. Normal compressibility and flow on color Doppler imaging. Profunda Femoral Vein: No evidence of thrombus. Normal compressibility and flow on color Doppler imaging. Femoral Vein: No evidence of thrombus. Normal compressibility, respiratory phasicity and response to augmentation. Popliteal Vein: No evidence of thrombus. Normal compressibility, respiratory phasicity and response to augmentation. Calf Veins: No evidence of thrombus. Normal compressibility and flow on color Doppler imaging. Superficial Great Saphenous Vein: No evidence of thrombus. Normal compressibility. Venous Reflux:  None. Other Findings: No evidence of superficial thrombophlebitis or abnormal fluid collection. IMPRESSION: No evidence of deep venous thrombosis in either lower extremity. Electronically Signed   By: Irish Lack M.D.   On: 09/02/2022 15:12    Procedures Procedures    Medications Ordered in ED Medications  furosemide (LASIX) injection 40 mg (40 mg Intravenous Given 09/02/22 1616)  HYDROcodone-acetaminophen (NORCO) 10-325 MG per tablet 1 tablet (1 tablet Oral Given 09/02/22 1819)    ED Course/ Medical Decision Making/ A&P    Patient seen and examined. History obtained directly from patient.   Labs/EKG: Ordered CBC, BMP, UA  Imaging: DVT studies ordered in triage, reviewed results which were negative.  Medications/Fluids: Ordered: Furosemide 40 mg IV  Most recent vital signs reviewed and are as follows: BP 101/80 (BP Location: Right Arm)   Pulse 75   Temp 98.2 F (36.8 C) (Oral)   Resp 18   Ht 5\' 1"  (1.549 m)   Wt 108 kg   LMP 06/27/2017  (Within Weeks)   SpO2 98%   BMI 44.97 kg/m   Initial impression: Pedal edema, recent noncompliance with furosemide.  7:30 PM Reassessment performed. Patient appears stable.  She has had very good urinary output.  Her son picked up her Lasix and she has this at bedside.  Patient is  requesting steroids that she states that this has helped with her rheumatoid arthritis and in the past has improved her foot pain.  Lab work appears reasonable including blood sugar.  Will give 5-day burst.  Patient was given a dose of her home hydrocodone 10-325 mg earlier for pain.  Labs personally reviewed and interpreted including: CBC with normal white blood cell count, hemoglobin with chronic anemia at 9.1; BMP glucose 91 and normal kidney function; UA equivocal for infection.  Imaging reviewed results including: DVT study, negative.  Reviewed pertinent lab work and imaging with patient at bedside. Questions answered.   Most current vital signs reviewed and are as follows: BP (!) 131/98 (BP Location: Right Arm)   Pulse 78   Temp 97.7 F (36.5 C) (Oral)   Resp 18   Ht  (1.549 m)   Wt 108 kg   LMP 06/27/2017 (Within Weeks)   SpO2 99%   BMI 44.97 kg/m   Plan: Discharge to home.   Prescriptions written for: Prednisone  Other home care instructions discussed: Consider getting compression stockings when able, elevate legs, compliance with Lasix.  ED return instructions discussed: Return with uncontrolled swelling, color change of the skin, fever.  Follow-up instructions discussed: Patient encouraged to follow-up with their PCP in 7 days.                               Medical Decision Making Risk Prescription drug management.   Patient here with continued edema of the lower extremities.  Likely complicated by recent noncompliance with Lasix.  Patient was evaluated for DVT today which was negative.  No other heart failure symptoms.  Her recent echocardiogram actually looked pretty good.   Considered underlying rheumatoid arthritis as another confounding factor.  There she looks well.  Labs appear to be at baseline.  The patient's vital signs, pertinent lab work and imaging were reviewed and interpreted as discussed in the ED course. Hospitalization was considered for further testing, treatments, or serial exams/observation. However as patient is well-appearing, has a stable exam, and reassuring studies today, I do not feel that they warrant admission at this time. This plan was discussed with the patient who verbalizes agreement and comfort with this plan and seems reliable and able to return to the Emergency Department with worsening or changing symptoms.          Final Clinical Impression(s) / ED Diagnoses Final diagnoses:  Bilateral lower extremity edema  Foot pain, bilateral    Rx / DC Orders ED Discharge Orders          Ordered    predniSONE (DELTASONE) 20 MG tablet  Daily        09/02/22 1929              Renne Crigler, Cordelia Poche 09/02/22 1936    Derwood Kaplan, MD 09/04/22 1551

## 2022-09-02 NOTE — Discharge Instructions (Signed)
Your blood work today shows anemia that is chronic, otherwise no concerning problems regarding your electrolytes or kidney function.  Your ultrasound did not show any signs of blood clot in either leg.  Please continue to take your Lasix as prescribed.  You may try taking prednisone for 5 days to see if this helps with your foot pain.

## 2022-09-02 NOTE — ED Triage Notes (Signed)
Pt brought in from home by [facility] with c/o bilateral leg swelling x 4 days. Pt reported to EMS that she hasn't taken her Lasix in the last 4 days. She also reports the prescription is filled at the pharmacy but she hasn't been to pick it up.

## 2022-09-14 DIAGNOSIS — M159 Polyosteoarthritis, unspecified: Secondary | ICD-10-CM | POA: Diagnosis not present

## 2022-09-14 DIAGNOSIS — I5041 Acute combined systolic (congestive) and diastolic (congestive) heart failure: Secondary | ICD-10-CM | POA: Diagnosis not present

## 2022-09-14 DIAGNOSIS — I255 Ischemic cardiomyopathy: Secondary | ICD-10-CM | POA: Diagnosis not present

## 2022-09-14 DIAGNOSIS — I5023 Acute on chronic systolic (congestive) heart failure: Secondary | ICD-10-CM | POA: Diagnosis not present

## 2022-09-14 DIAGNOSIS — D6869 Other thrombophilia: Secondary | ICD-10-CM | POA: Diagnosis not present

## 2022-09-14 DIAGNOSIS — I4891 Unspecified atrial fibrillation: Secondary | ICD-10-CM | POA: Diagnosis not present

## 2022-09-14 DIAGNOSIS — I200 Unstable angina: Secondary | ICD-10-CM | POA: Diagnosis not present

## 2022-09-14 DIAGNOSIS — I480 Paroxysmal atrial fibrillation: Secondary | ICD-10-CM | POA: Diagnosis not present

## 2022-10-04 ENCOUNTER — Telehealth: Payer: Self-pay | Admitting: Internal Medicine

## 2022-10-04 ENCOUNTER — Ambulatory Visit: Payer: Medicare HMO | Admitting: Internal Medicine

## 2022-10-04 NOTE — Telephone Encounter (Signed)
Patient is calling to get a form filled out for dronedarone (MULTAQ) 400 MG tablet, she states it expires on 5/30.

## 2022-10-04 NOTE — Telephone Encounter (Signed)
Patient lost her assistance form and was told by [facility] to come here.  She needs to bring her SSI statement and any out of pocket pharmacy expenses  since 05/17/22 with her.  She is going to look for form again at home.

## 2022-10-05 NOTE — Telephone Encounter (Signed)
Patient reports that she found her paperwork and will drop off at office for MD portion to be completed.

## 2022-10-12 ENCOUNTER — Encounter: Payer: Self-pay | Admitting: Podiatry

## 2022-10-12 ENCOUNTER — Ambulatory Visit (INDEPENDENT_AMBULATORY_CARE_PROVIDER_SITE_OTHER): Payer: Medicare HMO | Admitting: Podiatry

## 2022-10-12 DIAGNOSIS — M79671 Pain in right foot: Secondary | ICD-10-CM

## 2022-10-12 DIAGNOSIS — M10471 Other secondary gout, right ankle and foot: Secondary | ICD-10-CM

## 2022-10-12 MED ORDER — METHYLPREDNISOLONE 4 MG PO TBPK: ORAL_TABLET | ORAL | 0 refills | Status: DC

## 2022-10-14 DIAGNOSIS — I214 Non-ST elevation (NSTEMI) myocardial infarction: Secondary | ICD-10-CM | POA: Diagnosis not present

## 2022-10-14 DIAGNOSIS — D6869 Other thrombophilia: Secondary | ICD-10-CM | POA: Diagnosis not present

## 2022-10-14 DIAGNOSIS — I5041 Acute combined systolic (congestive) and diastolic (congestive) heart failure: Secondary | ICD-10-CM | POA: Diagnosis not present

## 2022-10-14 NOTE — Progress Notes (Signed)
  Subjective:  Patient ID: Kristine Montgomery, female    DOB: 01-01-1966,  MRN: [id number]  Chief Complaint  Patient presents with   Foot Pain    - bil foot pain / shooting, burning pain( rotates from one to the other currently the right) -history of gout at multiple sites    57 y.o. female presents with the above complaint. History confirmed with patient.  She returns for follow-up similar symptoms are happening again this time and has much more burning and tingling  Objective:  Physical Exam: warm, good capillary refill, no trophic changes or ulcerative lesions, normal DP and PT pulses, normal monofilament exam, normal sensory exam, and pain swelling and edema on the right midfoot and ankle with range of motion.   Assessment:   1. Other secondary gout of right ankle, unspecified chronicity      Plan:  Patient was evaluated and treated and all questions answered.  We discussed her current pain and swelling in her joint.  Current picture appears to be consistent with inflammatory arthritis or gout.  I did recommend checking her uric acid level again an order for this was given.  Some of her symptoms are also consistent with a neuropathic pain.  I recommend checking her B12 and folate level.  Methylprednisolone Rx sent to pharmacy she will get her lab work completed prior to taking the medication and will return to see [place] as needed I will let her know what her lab work shows.  No follow-ups on file.

## 2022-10-15 ENCOUNTER — Telehealth: Payer: Self-pay | Admitting: Internal Medicine

## 2022-10-15 NOTE — Telephone Encounter (Signed)
Pt c/o medication issue:  1. Name of Medication:   dronedarone (MULTAQ) 400 MG tablet    2. How are you currently taking this medication (dosage and times per day)? Take 1 tablet (400 mg total) by mouth 2 (two) times daily with a meal.   3. Are you having a reaction (difficulty breathing--STAT)? No  4. What is your medication issue? Pt called to check on the status of the form she dropped off to the office regarding this above medication last week for the MD portion to be completed. She'd like a callback regarding this matter. Please advise

## 2022-10-15 NOTE — Telephone Encounter (Signed)
I spoke with patient and made her aware that Dr.Ross is out of the office but the forms will be filed out as soon as she returns. She has plenty of medication as she just opened new bottle. She is aware we have samples if this changes.

## 2022-10-18 NOTE — Telephone Encounter (Signed)
Dr.Ross signed assistance form and will be faxed to to [employer] today,patient made aware.

## 2022-10-27 NOTE — Telephone Encounter (Signed)
Called to notify pt that [employer] replied back to application stating that the most current version of the application needs to be completed. Pt states that she will stop by office on tomorrow to complete.

## 2022-10-27 NOTE — Telephone Encounter (Signed)
Pt called in to ask if you all have heard anything back about the paperwork

## 2022-11-01 ENCOUNTER — Telehealth: Payer: Self-pay | Admitting: Internal Medicine

## 2022-11-01 ENCOUNTER — Other Ambulatory Visit: Payer: Self-pay

## 2022-11-01 MED ORDER — MULTAQ 400 MG PO TABS: 400.0000 mg | ORAL_TABLET | Freq: Two times a day (BID) | ORAL | 0 refills | Status: DC

## 2022-11-01 NOTE — Telephone Encounter (Signed)
Patient calling the office for samples of medication:   1.  What medication and dosage are you requesting samples for? dronedarone (MULTAQ) 400 MG tablet   2.  Are you currently out of this medication? 5 days left

## 2022-11-02 MED ORDER — MULTAQ 400 MG PO TABS: 400.0000 mg | ORAL_TABLET | Freq: Two times a day (BID) | ORAL | 0 refills | Status: DC

## 2022-11-02 NOTE — Telephone Encounter (Signed)
Spoke with pt who states that she will have her daughter pick up. Samples and pt assistance form placed at front desk.

## 2022-11-02 NOTE — Telephone Encounter (Signed)
Patient is following up as she is almost out of medication.

## 2022-11-02 NOTE — Telephone Encounter (Signed)
This is a [place] pt.  °

## 2022-11-03 ENCOUNTER — Telehealth: Payer: Self-pay | Admitting: Podiatry

## 2022-11-03 ENCOUNTER — Other Ambulatory Visit: Payer: Self-pay | Admitting: Podiatry

## 2022-11-03 MED ORDER — METHYLPREDNISOLONE 4 MG PO TBPK: ORAL_TABLET | ORAL | 0 refills | Status: DC

## 2022-11-03 NOTE — Telephone Encounter (Signed)
Called pt and let her know Rx was sent in to pharmacy by Dr. Ardelle Anton.

## 2022-11-03 NOTE — Telephone Encounter (Signed)
Dr. Lilian Kapur prescribed prednisone for the burning/aching in my foot/feet. I was calling to see if I can get a refill from [place] because I haven't had my blood work done yet and I cannot find relief

## 2022-11-05 ENCOUNTER — Telehealth: Payer: Self-pay | Admitting: Internal Medicine

## 2022-11-05 NOTE — Telephone Encounter (Signed)
Patient had questions on how to fill out Multaq pt assistance application. Went over instructions with pt- she will drop form off at [place] office on Monday.   Pt had no further questions or concerns at this time.

## 2022-11-05 NOTE — Telephone Encounter (Signed)
Patient called wanting to speak to [facility] about a form.

## 2022-11-10 ENCOUNTER — Telehealth: Payer: Self-pay | Admitting: Internal Medicine

## 2022-11-10 NOTE — Telephone Encounter (Signed)
  Pt is calling to follow up if Dr. Tenny Craw received her pt assistance from. She said, she dropped it off today

## 2022-11-10 NOTE — Telephone Encounter (Signed)
Patient dropped of her signature form to office today. She will stop by tomorrow to get samples.

## 2022-11-12 DIAGNOSIS — I480 Paroxysmal atrial fibrillation: Secondary | ICD-10-CM | POA: Diagnosis not present

## 2022-11-12 DIAGNOSIS — I5041 Acute combined systolic (congestive) and diastolic (congestive) heart failure: Secondary | ICD-10-CM | POA: Diagnosis not present

## 2022-11-12 DIAGNOSIS — G894 Chronic pain syndrome: Secondary | ICD-10-CM | POA: Diagnosis not present

## 2022-11-12 DIAGNOSIS — M19079 Primary osteoarthritis, unspecified ankle and foot: Secondary | ICD-10-CM | POA: Diagnosis not present

## 2022-11-16 ENCOUNTER — Telehealth: Payer: Self-pay | Admitting: Internal Medicine

## 2022-11-16 MED ORDER — DRONEDARONE HCL 400 MG PO TABS: 400.0000 mg | ORAL_TABLET | Freq: Two times a day (BID) | ORAL | 0 refills | Status: DC

## 2022-11-16 NOTE — Telephone Encounter (Signed)
Calling to see if dronedarone (MULTAQ) 400 MG tablet  is ready to be pick up. Please advise

## 2022-11-16 NOTE — Telephone Encounter (Signed)
Samples multaq 400 mg #24 to pt

## 2022-11-24 ENCOUNTER — Other Ambulatory Visit: Payer: Self-pay | Admitting: Internal Medicine

## 2022-11-25 ENCOUNTER — Telehealth: Payer: Self-pay | Admitting: Internal Medicine

## 2022-11-25 NOTE — Telephone Encounter (Signed)
Spoke with [place] at [employer] and she states that Pt has been approved. The office should receive the first shipment within the week.

## 2022-11-25 NOTE — Telephone Encounter (Signed)
Patient is called wanting to speak to [facility]. She said she calling about paperwork for her dronedarone (MULTAQ) 400 MG tablet.

## 2022-11-26 MED ORDER — MULTAQ 400 MG PO TABS: 400.0000 mg | ORAL_TABLET | Freq: Two times a day (BID) | ORAL | 0 refills | Status: DC

## 2022-11-26 NOTE — Telephone Encounter (Signed)
Samples left at front desk for pt Multaq 499 mg

## 2022-11-26 NOTE — Telephone Encounter (Signed)
Patients multaq arrived from the mail,pt will get next week

## 2022-11-26 NOTE — Telephone Encounter (Signed)
Patient will have aide pick up on 11/29/22

## 2022-11-26 NOTE — Telephone Encounter (Signed)
Patient would like to know if office have any samples until her medication gets here. She states she only have 3 left. Please advise

## 2022-12-06 ENCOUNTER — Ambulatory Visit: Payer: Medicare HMO | Attending: Internal Medicine | Admitting: Internal Medicine

## 2022-12-06 ENCOUNTER — Other Ambulatory Visit: Payer: Self-pay | Admitting: Podiatry

## 2022-12-06 ENCOUNTER — Encounter: Payer: Self-pay | Admitting: Internal Medicine

## 2022-12-06 ENCOUNTER — Other Ambulatory Visit (HOSPITAL_COMMUNITY)
Admission: RE | Admit: 2022-12-06 | Discharge: 2022-12-06 | Disposition: A | Discharge status: Home / Self Care | Payer: Medicare HMO | Source: Ambulatory Visit | Attending: Internal Medicine | Admitting: Internal Medicine

## 2022-12-06 ENCOUNTER — Other Ambulatory Visit (HOSPITAL_COMMUNITY)
Admission: RE | Admit: 2022-12-06 | Discharge: 2022-12-06 | Disposition: A | Discharge status: Home / Self Care | Payer: Medicare HMO | Source: Ambulatory Visit | Attending: Podiatry | Admitting: Podiatry

## 2022-12-06 VITALS — BP 136/80 | HR 76 | Ht 61.0 in | Wt 240.0 lb

## 2022-12-06 DIAGNOSIS — R609 Edema, unspecified: Secondary | ICD-10-CM | POA: Diagnosis not present

## 2022-12-06 DIAGNOSIS — I480 Paroxysmal atrial fibrillation: Secondary | ICD-10-CM | POA: Diagnosis not present

## 2022-12-06 DIAGNOSIS — M10471 Other secondary gout, right ankle and foot: Secondary | ICD-10-CM

## 2022-12-06 DIAGNOSIS — R7989 Other specified abnormal findings of blood chemistry: Secondary | ICD-10-CM | POA: Diagnosis not present

## 2022-12-06 LAB — COMPREHENSIVE METABOLIC PANEL
ALT: 14 U/L (ref 0–44)
AST: 12 U/L — ABNORMAL LOW (ref 15–41)
Albumin: 2.9 g/dL — ABNORMAL LOW (ref 3.5–5.0)
Alkaline Phosphatase: 75 U/L (ref 38–126)
Anion gap: 7 (ref 5–15)
BUN: 20 mg/dL (ref 6–20)
CO2: 25 mmol/L (ref 22–32)
Calcium: 8.6 mg/dL — ABNORMAL LOW (ref 8.9–10.3)
Chloride: 103 mmol/L (ref 98–111)
Creatinine, Ser: 0.76 mg/dL (ref 0.44–1.00)
GFR, Estimated: >60 mL/min (ref >60)
Glucose, Bld: 101 mg/dL — ABNORMAL HIGH (ref 70–99)
Potassium: 3.9 mmol/L (ref 3.5–5.1)
Sodium: 135 mmol/L (ref 135–145)
Total Bilirubin: 1 mg/dL (ref 0.3–1.2)
Total Protein: 7.6 g/dL (ref 6.5–8.1)

## 2022-12-06 LAB — LIPID PANEL
Cholesterol: 123 mg/dL (ref 0–200)
HDL: 40 mg/dL — ABNORMAL LOW (ref >40)
LDL Cholesterol: 65 mg/dL (ref 0–99)
Total CHOL/HDL Ratio: 3.1 RATIO
Triglycerides: 88 mg/dL (ref <150)
VLDL: 18 mg/dL (ref 0–40)

## 2022-12-06 LAB — CBC
HCT: 32.8 % — ABNORMAL LOW (ref 36.0–46.0)
Hemoglobin: 10.1 g/dL — ABNORMAL LOW (ref 12.0–15.0)
MCH: 26 pg (ref 26.0–34.0)
MCHC: 30.8 g/dL (ref 30.0–36.0)
MCV: 84.3 fL (ref 80.0–100.0)
Platelets: 299 10*3/uL (ref 150–400)
RBC: 3.89 MIL/uL (ref 3.87–5.11)
RDW: 17.1 % — ABNORMAL HIGH (ref 11.5–15.5)
WBC: 8.7 10*3/uL (ref 4.0–10.5)
nRBC: 0 % (ref 0.0–0.2)

## 2022-12-06 LAB — BRAIN NATRIURETIC PEPTIDE: B Natriuretic Peptide: 65 pg/mL (ref 0.0–100.0)

## 2022-12-06 NOTE — Patient Instructions (Signed)
Medication Instructions:  Your physician recommends that you continue on your current medications as directed. Please refer to the Current Medication list given to you today.  Reduce your salt intake   *If you need a refill on your cardiac medications before your next appointment, please call your pharmacy*   Lab Work: Your physician recommends that you return for lab work to be done by your PCP.   If you have labs (blood work) drawn today and your tests are completely normal, you will receive your results only by: MyChart Message (if you have MyChart) OR A paper copy in the mail If you have any lab test that is abnormal or we need to change your treatment, we will call you to review the results.   Testing/Procedures: NONE    Follow-Up: At [facility], you and your health needs are our priority.  As part of our continuing mission to provide you with exceptional heart care, we have created designated Provider Care Teams.  These Care Teams include your primary Cardiologist (physician) and Advanced Practice Providers (APPs -  Physician Assistants and Nurse Practitioners) who all work together to provide you with the care you need, when you need it.  We recommend signing up for the patient portal called "MyChart".  Sign up information is provided on this After Visit Summary.  MyChart is used to connect with patients for Virtual Visits (Telemedicine).  Patients are able to view lab/test results, encounter notes, upcoming appointments, etc.  Non-urgent messages can be sent to your provider as well.   To learn more about what you can do with MyChart, go to [email or web address].    Your next appointment:   9 month(s)  Provider:   You may see Dietrich Pates, MD or one of the following Advanced Practice Providers on your designated Care Team:   Randall An, PA-C  Jacolyn Reedy, PA-C     Other Instructions Thank you for choosing [facility]!

## 2022-12-06 NOTE — Progress Notes (Signed)
Patient called for lab work today at [facility], order placed for uric acid

## 2022-12-06 NOTE — Progress Notes (Signed)
Cardiology Office Note   Date:  12/06/2022   ID:  Myrtha, Tonkovich Oct 17, 1965, MRN [id number]  PCP:  Elfredia Nevins, MD  Cardiologist:   Dietrich Pates, MD   Pateint presents for f/u of CAD     History of Present Illness: Kristine Montgomery is a 57 y.o. female with a historyof CAD (s/p NSTEMI in 07/2020 with DES to prox-LAD and DES to prox-LCx and medical management recommended of occluded RCA), HFimpEF (EF 30% by echo in 07/2020, at 55-60% by echo in  Hospitalized for afib with RVR  Converted with amiodarone    I saw the pt in clinic in October 2023   She was seen in ER for LE edema in Feb 2024   Given IV lasix with diruesis She was seen by Margaretha Glassing in Feb 2024  Lasix increased to 40 bid x 5 day then reduced to 40 daily     Since seen she denies CP   Breathing is OK though she is not very active    She does note increased swelling in feet   Admits to eating saltier foods      Current Meds  Medication Sig   apixaban (ELIQUIS) 5 MG TABS tablet TAKE (1) TABLET BY MOUTH TWICE DAILY.   aspirin EC 81 MG tablet Take 81 mg by mouth daily. Swallow whole.   atorvastatin (LIPITOR) 80 MG tablet TAKE (1) TABLET BY MOUTH ONCE DAILY AT 6 PM   dapagliflozin propanediol (FARXIGA) 10 MG TABS tablet TAKE 1 TABLET BY MOUTH ONCE A DAY.   diclofenac Sodium (VOLTAREN) 1 % GEL Apply topically.   dronedarone (MULTAQ) 400 MG tablet Take 400 mg by mouth 2 (two) times daily with a meal.   ENTRESTO 24-26 MG TAKE 1 TABLET BY MOUTH TWICE A DAY   famotidine (PEPCID) 40 MG tablet TAKE ONE TABLET BY MOUTH IN THE EVENING.   folic acid (FOLVITE) 1 MG tablet Take 1 tablet (1 mg total) by mouth daily.   furosemide (LASIX) 40 MG tablet Take 40 mg by mouth twice daily for 5 days then take 40 mg by mouth daily thereafter   HYDROcodone-acetaminophen (NORCO) 10-325 MG tablet Take 1-2 tablets by mouth every 6 (six) hours as needed.   meloxicam (MOBIC) 7.5 MG tablet Take 7.5 mg by mouth 2 (two) times daily.   methotrexate  (RHEUMATREX) 2.5 MG tablet Take 6 tablets (15 mg total) by mouth once a week. Caution:Chemotherapy. Protect from light.   methylPREDNISolone (MEDROL DOSEPAK) 4 MG TBPK tablet 6 day dose pack - take as directed   metoprolol succinate (TOPROL-XL) 25 MG 24 hr tablet TAKE 1/2 TABLET BY MOUTH ONCE DAILY.   nitroGLYCERIN (NITROSTAT) 0.4 MG SL tablet PLACE 1 TABLET (0.4 MG TOTAL) UNDER THE TONGUE EVERY FIVE MINUTES X 3 DOSES AS NEEDED FOR CHEST PAIN. (Patient taking differently: Place 0.4 mg under the tongue every 5 (five) minutes as needed for chest pain.)   potassium chloride SA (KLOR-CON M) 20 MEQ tablet Take 20 meq by mouth twice daily for 5 days then take 20 meq by mouth daily thereafter   predniSONE (DELTASONE) 20 MG tablet Take 2 tablets (40 mg total) by mouth daily.   zinc oxide (MEIJER ZINC OXIDE) 20 % ointment Apply 1 Application topically as needed for irritation.     Allergies:   Patient has no known allergies.   Past Medical History:  Diagnosis Date   Acute combined systolic and diastolic CHF, NYHA class 4 (HCC) 07/29/2020  a. EF 30% by echo in 07/2020 b. EF 55-60% by echo in 11/2020   Arthritis    Asthma    CAD (coronary artery disease)    a. 07/2020: s/p NSTEMI with DES to proxLAD and DES to proxLCx with medical management recommended of the occluded RCA.   Chronic bronchitis (HCC)    COPD (chronic obstructive pulmonary disease) (HCC)    Edema extremities    History of hiatal hernia    Hyperlipidemia LDL goal <70 07/29/2020   Ovarian cyst    Pneumonia    Rheumatoid arthritis (HCC)     Past Surgical History:  Procedure Laterality Date   CESAREAN SECTION     CORONARY STENT INTERVENTION N/A 07/29/2020   Procedure: CORONARY STENT INTERVENTION;  Surgeon: Lennette Bihari, MD;  [facility];  Service: Cardiovascular;  Laterality: N/A;   KNEE ARTHROSCOPY WITH MEDIAL MENISECTOMY Right 05/23/2020   Procedure: KNEE ARTHROSCOPY WITH MEDIAL AND LATERAL MENISECTOMY; 3  COMPARTMENT SYNOVECTOMY;  Surgeon: Vickki Hearing, MD;  Location: [facility];  Service: Orthopedics;  Laterality: Right;   RIGHT/LEFT HEART CATH AND CORONARY ANGIOGRAPHY N/A 07/29/2020   Procedure: RIGHT/LEFT HEART CATH AND CORONARY ANGIOGRAPHY;  Surgeon: Lennette Bihari, MD;  Location: [facility];  Service: Cardiovascular;  Laterality: N/A;   TONSILLECTOMY     TUBAL LIGATION       Social History:  The patient  reports that she quit smoking about 2 years ago. Her smoking use included cigarettes. She started smoking about 32 years ago. She has a 15 pack-year smoking history. She has been exposed to tobacco smoke. She has never used smokeless tobacco. She reports that she does not currently use alcohol. She reports that she does not use drugs.   Family History:  The patient's family history includes COPD in her father; Cancer in her maternal grandmother, mother, and paternal grandfather; Congestive Heart Failure in her mother; Diabetes in her mother; Hypertension in her sister; Non-Hodgkin's lymphoma in her mother; Other in her son.    ROS:  Please see the history of present illness. All other systems are reviewed and  Negative to the above problem except as noted.    PHYSICAL EXAM: VS:  BP 136/80 (BP Location: Right Arm, Patient Position: Sitting, Cuff Size: Normal)   Pulse 76   Ht 5\' 1"  (1.549 m)   Wt 240 lb (108.9 kg)   LMP 06/27/2017 (Within Weeks)   SpO2 98%   BMI 45.35 kg/m   GEN: Morbidly obese in no acute distress Examined in chair HEENT: normal  Neck: no JVD, no carotid bruits Cardiac: RRR; no murmur   1+ LE  edema  Respiratory:  clear to auscultation GI: soft, nontender, obese MS: no deformity   EKG:  EKG is not ordered today.  Echo   March 2024 1. Left ventricular ejection fraction, by estimation, is 55 to 60%. The  left ventricle has normal function. The left ventricle demonstrates  regional wall motion abnormalities (see scoring diagram/findings for   description). There is mild left ventricular   hypertrophy. Left ventricular diastolic parameters are consistent with  Grade I diastolic dysfunction (impaired relaxation).   2. Right ventricular systolic function is normal. The right ventricular  size is normal. Tricuspid regurgitation signal is inadequate for assessing  PA pressure.   3. The mitral valve is grossly normal. Trivial mitral valve  regurgitation.   4. The aortic valve is tricuspid. Aortic valve regurgitation is not  visualized.   5.  The inferior vena cava is normal in size with greater than 50%  respiratory variability, suggesting right atrial pressure of 3 mmHg.   Comparison(s): Prior images reviewed side by side. LVEF remains normal  range at 55-60% and wall motion abnormalities are less apparent.    Lipid Panel    Component Value Date/Time   CHOL 211 (H) 07/29/2020 0236   TRIG 126 08/21/2021 1402   HDL 49 08/21/2021 1402   CHOLHDL 9.6 07/29/2020 0236   VLDL 37 07/29/2020 0236   LDLCALC 152 (H) 07/29/2020 0236      Wt Readings from Last 3 Encounters:  12/06/22 240 lb (108.9 kg)  09/02/22 238 lb (108 kg)  07/20/22 240 lb (108.9 kg)      ASSESSMENT AND PLAN:  1  CAD   Last intervention in 2022  Pt  reports no symptoms of angina  Will review use of ASA given that she is on Eliquis       2  HL  Keep on statin   Will check lipids   3  Hx of HFrEF   Seen in ER earlier this year  Diuresed    Echo in March was 55 to 60%   Now breathing is OK but complains of edema  MIld today Will check CMET and BNP Instructed patient  to eat less salt  4  PAF  Regular rate  on Multaq.(Would continue even with mild volume increase.  Will do better in SR  Keep on Eliquis      5  CV dz   Carotid USN this year shows minimal plaquing     COntinue meds   Check CMET, CBC, BNP, lpids     Follow up tentatively in spring 2025 Current medicines are reviewed at length with the patient today.  The patient does not have concerns  regarding medicines.  Signed, Dietrich Pates, MD  12/06/2022 10:49 AM    [facility] [address] [place], [place], [place]  [address] Phone: [phone]; Fax: [phone]

## 2022-12-13 ENCOUNTER — Telehealth: Payer: Self-pay | Admitting: Podiatry

## 2022-12-13 DIAGNOSIS — M25471 Effusion, right ankle: Secondary | ICD-10-CM

## 2022-12-13 DIAGNOSIS — M10471 Other secondary gout, right ankle and foot: Secondary | ICD-10-CM

## 2022-12-13 NOTE — Telephone Encounter (Signed)
Patient requesting call back for lab results. DOV 12/06/2022.  1. Other secondary gout of right ankle, unspecified chronicity   2. Pain and swelling of right ankle

## 2022-12-15 DIAGNOSIS — I480 Paroxysmal atrial fibrillation: Secondary | ICD-10-CM | POA: Diagnosis not present

## 2022-12-15 DIAGNOSIS — I255 Ischemic cardiomyopathy: Secondary | ICD-10-CM | POA: Diagnosis not present

## 2022-12-15 DIAGNOSIS — Z6841 Body Mass Index (BMI) 40.0 and over, adult: Secondary | ICD-10-CM | POA: Diagnosis not present

## 2022-12-15 DIAGNOSIS — M2550 Pain in unspecified joint: Secondary | ICD-10-CM | POA: Diagnosis not present

## 2022-12-15 DIAGNOSIS — G894 Chronic pain syndrome: Secondary | ICD-10-CM | POA: Diagnosis not present

## 2022-12-15 DIAGNOSIS — D6869 Other thrombophilia: Secondary | ICD-10-CM | POA: Diagnosis not present

## 2022-12-27 ENCOUNTER — Ambulatory Visit: Payer: Medicare HMO | Admitting: Podiatry

## 2022-12-27 DIAGNOSIS — M10471 Other secondary gout, right ankle and foot: Secondary | ICD-10-CM

## 2022-12-27 DIAGNOSIS — I739 Peripheral vascular disease, unspecified: Secondary | ICD-10-CM | POA: Diagnosis not present

## 2022-12-27 DIAGNOSIS — G6289 Other specified polyneuropathies: Secondary | ICD-10-CM

## 2022-12-27 MED ORDER — METHYLPREDNISOLONE 4 MG PO TBPK: ORAL_TABLET | ORAL | 0 refills | Status: DC

## 2022-12-27 NOTE — Progress Notes (Signed)
  Subjective:  Patient ID: Kristine Montgomery, female    DOB: Aug 17, 1965,  MRN: [id number]  Chief Complaint  Patient presents with   Foot Pain    Pain in both feet that started last week and It won't get better.    58 y.o. female presents with the above complaint. History confirmed with patient.  She returns for follow-up, there was some confusion and she was not able to get the lab work done.  The prednisone tapers have been helpful  Objective:  Physical Exam: warm, good capillary refill, no trophic changes or ulcerative lesions, normal DP and PT pulses, normal monofilament exam, normal sensory exam, and pain swelling and edema on the today left worse than right midfoot and ankle with range of motion.  Subjective paresthesias noted.   Assessment:   1. Other secondary gout of right ankle, unspecified chronicity   2. Other polyneuropathy   3. Claudication [facility])      Plan:  Patient was evaluated and treated and all questions answered.  There is some confusion with the ordering of the lab work that I think led to a cancellation of the studies due to some overlap with routine lab work from her PCP.  I placed new orders for this to be taken she will do this at [facility] [place].  I also recommend a referral to neurology for further workup.  This was placed to [facility] neurology.  Methylprednisolone taper was ordered as well as noninvasive vascular testing to evaluate if there is claudication type symptoms that could be causing this.  She has a palpable DP pulse but her PT pulse is harder to palpate due to the edema.  Return if symptoms worsen or fail to improve.

## 2022-12-29 ENCOUNTER — Encounter: Payer: Self-pay | Admitting: Neurology

## 2023-01-04 ENCOUNTER — Encounter (HOSPITAL_COMMUNITY): Payer: Medicare HMO

## 2023-01-10 DIAGNOSIS — M19079 Primary osteoarthritis, unspecified ankle and foot: Secondary | ICD-10-CM | POA: Diagnosis not present

## 2023-01-10 DIAGNOSIS — I480 Paroxysmal atrial fibrillation: Secondary | ICD-10-CM | POA: Diagnosis not present

## 2023-01-10 DIAGNOSIS — G894 Chronic pain syndrome: Secondary | ICD-10-CM | POA: Diagnosis not present

## 2023-01-10 DIAGNOSIS — I255 Ischemic cardiomyopathy: Secondary | ICD-10-CM | POA: Diagnosis not present

## 2023-01-18 ENCOUNTER — Other Ambulatory Visit: Payer: Self-pay | Admitting: Internal Medicine

## 2023-01-18 ENCOUNTER — Telehealth: Payer: Self-pay | Admitting: Internal Medicine

## 2023-01-18 ENCOUNTER — Other Ambulatory Visit: Payer: Self-pay | Admitting: Student

## 2023-01-18 MED ORDER — ENTRESTO 24-26 MG PO TABS: 1.0000 | ORAL_TABLET | Freq: Two times a day (BID) | ORAL | 2 refills | Status: DC

## 2023-01-18 MED ORDER — APIXABAN 5 MG PO TABS: 5.0000 mg | ORAL_TABLET | Freq: Two times a day (BID) | ORAL | 5 refills | Status: DC

## 2023-01-18 NOTE — Telephone Encounter (Signed)
*  STAT* If patient is at the pharmacy, call can be transferred to refill team.   1. Which medications need to be refilled? (please list name of each medication and dose if known)   apixaban (ELIQUIS) 5 MG TABS tablet   ENTRESTO 24-26 MG  2. Which pharmacy/location (including street and city if local pharmacy) is medication to be sent to? [facility] - , [place] - [address]    3. Do they need a 30 day or 90 day supply? 90 day

## 2023-01-18 NOTE — Telephone Encounter (Signed)
Entresto refill completed.

## 2023-01-18 NOTE — Telephone Encounter (Signed)
Prescription refill request for Eliquis received. Indication: PAF Last office visit: 12/06/22  Lovina Reach MD Scr: 0.76 on 12/06/22  Epic Age: 57 Weight: 108.9kg  Based on above findings Eliquis 5mg  twice daily is the appropriate dose.  Refill approved.

## 2023-01-19 NOTE — Progress Notes (Signed)
Initial neurology clinic note  Reason for Evaluation: Consultation requested by Edwin Cap, DPM for an opinion regarding bilateral foot pain. My final recommendations will be communicated back to the requesting physician by way of shared medical record or letter to requesting physician via [place] mail.  HPI: This is Ms. Kristine Montgomery, a 57 y.o. right-handed female with a medical history of HFpEF, CAD c/b MI, afib w/ RVR (on eliquis), HTN, COPD, HLD, rheumatoid arthritis, and former smoker who presents to neurology clinic with the chief complaint of bilateral foot pain. The patient is accompanied by daughter.  Patient has burning pain in bilateral feet for the last 4-5 months. Patient has seen podiatry with concern for potential gout, but was told she did not have this. There was concern for neuropathy, so patient was sent to neurology. She has occasional back but this does not radiate into her leg. Symptoms are not present at night when in bed or when laying down. The symptoms occur when she is on her feet. Just putting her feet down on the carpet to go to the bathroom will cause burning pain. When she sits down the burning will go away. She will have pills and needles in her feet that is constant though. She mentions that when her feet are cold, her big will go purple. Patient was given steroids for pain in feet that helped, but never been given neuropathic pain medication.  Patient uses a rollator around the house due to arthritis and pain in her knees. She will use a wheelchair when going longer distances. She does think her legs are weak. She has fallen two times this year due to the knees.  Patient had ABI on 01/25/23. The preliminary results appear to suggest a normal test.  Patient denies autonomic symptoms (impaired sweating, bloating, early satiety, bowel or bladder changes, syncope/near syncope).  She does not report any constitutional symptoms like fever, night sweats, anorexia or  unintentional weight loss.  EtOH use: None  Restrictive diet? No Family history of neuropathy/myopathy/neurologic disease? Father has neuropathy from chemotherapy (lung cancer)   MEDICATIONS:  Outpatient Encounter Medications as of 01/27/2023  Medication Sig   apixaban (ELIQUIS) 5 MG TABS tablet Take 1 tablet (5 mg total) by mouth 2 (two) times daily. TAKE (1) TABLET BY MOUTH TWICE DAILY.   aspirin EC 81 MG tablet Take 81 mg by mouth daily. Swallow whole.   atorvastatin (LIPITOR) 80 MG tablet TAKE (1) TABLET BY MOUTH ONCE DAILY AT 6 PM   dapagliflozin propanediol (FARXIGA) 10 MG TABS tablet TAKE 1 TABLET BY MOUTH ONCE A DAY.   famotidine (PEPCID) 40 MG tablet TAKE ONE TABLET BY MOUTH IN THE EVENING.   furosemide (LASIX) 40 MG tablet Take 40 mg by mouth twice daily for 5 days then take 40 mg by mouth daily thereafter (Patient taking differently: Take 20 mg by mouth daily. Take 40 mg by mouth twice daily for 5 days then take 40 mg by mouth daily thereafter)   HYDROcodone-acetaminophen (NORCO) 10-325 MG tablet Take 1-2 tablets by mouth every 6 (six) hours as needed.   metoprolol succinate (TOPROL-XL) 25 MG 24 hr tablet TAKE 1/2 TABLET BY MOUTH ONCE DAILY.   nitroGLYCERIN (NITROSTAT) 0.4 MG SL tablet PLACE 1 TABLET (0.4 MG TOTAL) UNDER THE TONGUE EVERY FIVE MINUTES X 3 DOSES AS NEEDED FOR CHEST PAIN. (Patient taking differently: Place 0.4 mg under the tongue every 5 (five) minutes as needed for chest pain.)   sacubitril-valsartan (ENTRESTO) 24-26  MG Take 1 tablet by mouth 2 (two) times daily.   diclofenac Sodium (VOLTAREN) 1 % GEL Apply topically. (Patient not taking: Reported on 01/27/2023)   dronedarone (MULTAQ) 400 MG tablet Take 400 mg by mouth 2 (two) times daily with a meal. (Patient not taking: Reported on 01/27/2023)   folic acid (FOLVITE) 1 MG tablet Take 1 tablet (1 mg total) by mouth daily. (Patient not taking: Reported on 01/27/2023)   meloxicam (MOBIC) 7.5 MG tablet Take 7.5 mg by mouth 2  (two) times daily. (Patient not taking: Reported on 01/27/2023)   methotrexate (RHEUMATREX) 2.5 MG tablet Take 6 tablets (15 mg total) by mouth once a week. Caution:Chemotherapy. Protect from light. (Patient not taking: Reported on 01/27/2023)   methylPREDNISolone (MEDROL DOSEPAK) 4 MG TBPK tablet 6 day dose pack - take as directed (Patient not taking: Reported on 01/27/2023)   potassium chloride SA (KLOR-CON M) 20 MEQ tablet Take 20 meq by mouth twice daily for 5 days then take 20 meq by mouth daily thereafter (Patient not taking: Reported on 01/27/2023)   predniSONE (DELTASONE) 20 MG tablet Take 2 tablets (40 mg total) by mouth daily. (Patient not taking: Reported on 01/27/2023)   zinc oxide (MEIJER ZINC OXIDE) 20 % ointment Apply 1 Application topically as needed for irritation. (Patient not taking: Reported on 01/27/2023)   No facility-administered encounter medications on file as of 01/27/2023.    PAST MEDICAL HISTORY: Past Medical History:  Diagnosis Date   Acute combined systolic and diastolic CHF, NYHA class 4 (HCC) 07/29/2020   a. EF 30% by echo in 07/2020 b. EF 55-60% by echo in 11/2020   Arthritis    Asthma    CAD (coronary artery disease)    a. 07/2020: s/p NSTEMI with DES to proxLAD and DES to proxLCx with medical management recommended of the occluded RCA.   Chronic bronchitis (HCC)    COPD (chronic obstructive pulmonary disease) (HCC)    Edema extremities    History of hiatal hernia    Hyperlipidemia LDL goal <70 07/29/2020   Ovarian cyst    Pneumonia    Rheumatoid arthritis (HCC)     PAST SURGICAL HISTORY: Past Surgical History:  Procedure Laterality Date   CESAREAN SECTION     CORONARY STENT INTERVENTION N/A 07/29/2020   Procedure: CORONARY STENT INTERVENTION;  Surgeon: Lennette Bihari, MD;  Location: [facility];  Service: Cardiovascular;  Laterality: N/A;   KNEE ARTHROSCOPY WITH MEDIAL MENISECTOMY Right 05/23/2020   Procedure: KNEE ARTHROSCOPY WITH MEDIAL AND  LATERAL MENISECTOMY; 3 COMPARTMENT SYNOVECTOMY;  Surgeon: Vickki Hearing, MD;  Location: [facility];  Service: Orthopedics;  Laterality: Right;   RIGHT/LEFT HEART CATH AND CORONARY ANGIOGRAPHY N/A 07/29/2020   Procedure: RIGHT/LEFT HEART CATH AND CORONARY ANGIOGRAPHY;  Surgeon: Lennette Bihari, MD;  Location: [facility];  Service: Cardiovascular;  Laterality: N/A;   TONSILLECTOMY     TUBAL LIGATION      ALLERGIES: No Known Allergies  FAMILY HISTORY: Family History  Problem Relation Age of Onset   Cancer Paternal Grandfather        colon   Cancer Maternal Grandmother        lung   COPD Father    Non-Hodgkin's lymphoma Mother    Diabetes Mother    Cancer Mother        cervical   Congestive Heart Failure Mother    Hypertension Sister    Other Son        fluid around  heart    SOCIAL HISTORY: Social History   Tobacco Use   Smoking status: Former    Current packs/day: 0.00    Average packs/day: 0.5 packs/day for 30.0 years (15.0 ttl pk-yrs)    Types: Cigarettes    Start date: 07/1990    Quit date: 07/2020    Years since quitting: 2.5    Passive exposure: Past   Smokeless tobacco: Never  Vaping Use   Vaping status: Never Used  Substance Use Topics   Alcohol use: Not Currently   Drug use: No   Social History   Social History Narrative   Are you right handed or left handed? Right   Are you currently employed ?    What is your current occupation? disabled   Do you live at home alone?yes   Who lives with you?    What type of home do you live in: 1 story or 2 story? one   Caffeine non      OBJECTIVE: PHYSICAL EXAM: BP 130/78   Pulse (!) 58   Ht 5\' 1"  (1.549 m)   Wt 263 lb (119.3 kg)   LMP 06/27/2017 (Within Weeks)   SpO2 95%   BMI 49.69 kg/m   General: General appearance: Awake and alert. No distress. Cooperative with exam.  Skin: No obvious rash or jaundice. HEENT: Atraumatic. Anicteric. Lungs: Non-labored breathing on room air  Extremities:  Peripheral edema in bilateral lower extremities. Arthritic changes in bilateral hands. Psych: Affect appropriate.  Neurological: Mental Status: Alert. Speech fluent. No pseudobulbar affect Cranial Nerves: CNII: No RAPD. Visual fields grossly intact. CNIII, IV, VI: PERRL. No nystagmus. EOMI. CN V: Facial sensation intact bilaterally to fine touch. CN VII: Facial muscles symmetric and strong. No ptosis at rest. CN VIII: Hearing grossly intact bilaterally. CN IX: No hypophonia. CN X: Palate elevates symmetrically. CN XI: Full strength shoulder shrug bilaterally. CN XII: Tongue protrusion full and midline. No atrophy or fasciculations. No significant dysarthria Motor: Tone is normal. No atrophy.  Individual muscle group testing (MRC grade out of 5):  Movement     Neck flexion 5    Neck extension 5     Right Left   Shoulder abduction 5 5   Shoulder adduction 5 5   Elbow flexion 5 5   Elbow extension 5 5   Finger abduction - FDI 5 5   Finger abduction - ADM 5 5   Finger extension 5 5   Finger flexion 5- 5- Limited by arthritic hand pain   Hip flexion 5 5   Hip extension 5 5   Hip adduction 5 5   Hip abduction 5 5   Knee extension 5 5   Knee flexion 5 5   Dorsiflexion 5 5-   Plantarflexion 5 5   Inversion 5 5   Eversion 5 5   Great toe extension 5- 5-   Great toe flexion 4 4     Reflexes:  Right Left   Bicep 2+ 2+   Tricep 2+ 2+   BrRad 2+ 2+   Knee 2+ 2+   Ankle 2+ 2+    Pathological Reflexes: Babinski: flexor response bilaterally Hoffman: absent bilaterally Troemner: present on right, absent on left Sensation: Pinprick: Intact in all extremities Vibration: Intact in all extremities Proprioception: Intact in bilateral great toes Coordination: Intact finger-to- nose-finger bilaterally. Romberg unable to be tested as patient cannot stand without assistance due to knee pain. Gait: Unable to rise from chair with arms crossed unassisted.  Walks with walker.  Antalgic gait.   Lab and Test Review: Internal labs: 12/06/22: CBC: significant for Hb 10.1 (chronic), MCV 84.3 CMP: significant for glucose 101, albumin 2.9 Lipid panel: Component     Latest Ref Rng 12/06/2022  Cholesterol     0 - 200 mg/dL 161   Triglycerides     <150 mg/dL 88   HDL Cholesterol     >40 mg/dL 40 (L)   Total CHOL/HDL Ratio     RATIO 3.1   VLDL     0 - 40 mg/dL 18   LDL (calc)     0 - 99 mg/dL 65     Ferritin (0/9/60): 297 ESR (04/21/22): 101 (> 140 1 year ago) HbA1c (02/18/22): 5.1  01/07/22: RF 482.3 ANA negative  TSH (03/24/21): wnl  Imaging: Bilateral venous US (09/02/22): IMPRESSION: No evidence of deep venous thrombosis in either lower extremity.  Carotid ultrasound (09/02/21): IMPRESSION: Color duplex indicates minimal heterogeneous and calcified plaque, with no hemodynamically significant stenosis by duplex criteria in the extracranial cerebrovascular circulation.   ASSESSMENT: Kristine Montgomery is a 57 y.o. female who presents for evaluation of burning pain in her feet. She has a relevant medical history of HFpEF, CAD c/b MI, afib w/ RVR (on eliquis), HTN, COPD, HLD, rheumatoid arthritis, and former smoker. Her neurological examination is pertinent for poor ambulation due to knee pain, but essentially normal strength, sensation, and reflexes. Available diagnostic data is significant for HbA1c of 5.1, elevated RF. The etiology of patient's symptoms is currently unclear. While burning in feet could be consistent with neuropathy, her normal examination, including reflexes does not clear support this. I will look for treatable causes and get an EMG to clarify.  PLAN: -Blood work: B1, B12, IFE, copper, vit E -EMG: Bilateral LE (L > R) -If EMG is negative, may consider skin biopsy for small fiber neuropathy -Will start gabapentin 100 mg BID  -Return to clinic to be determined after testing is complete  The impression above as well as the plan as  outlined below were extensively discussed with the patient (in the company of daughter) who voiced understanding. All questions were answered to their satisfaction.  The patient was counseled on pertinent fall precautions per the printed material provided today, and as noted under the "Patient Instructions" section below.  When available, results of the above investigations and possible further recommendations will be communicated to the patient via telephone/MyChart. Patient to call office if not contacted after expected testing turnaround time.   Total time spent reviewing records, interview, history/exam, documentation, and coordination of care on day of encounter:  55 min   Thank you for allowing me to participate in patient's care.  If I can answer any additional questions, I would be pleased to do so.  Jacquelyne Balint, MD   CC: Elfredia Nevins, MD [address] [place] [address]  CC: Referring provider: Edwin Cap, DPM [address] [place],  [place] [address]

## 2023-01-25 ENCOUNTER — Ambulatory Visit: Payer: Medicare HMO | Attending: Podiatry

## 2023-01-25 DIAGNOSIS — I739 Peripheral vascular disease, unspecified: Secondary | ICD-10-CM

## 2023-01-27 ENCOUNTER — Encounter: Payer: Self-pay | Admitting: Neurology

## 2023-01-27 ENCOUNTER — Ambulatory Visit (INDEPENDENT_AMBULATORY_CARE_PROVIDER_SITE_OTHER): Payer: Medicare HMO | Admitting: Neurology

## 2023-01-27 ENCOUNTER — Other Ambulatory Visit (INDEPENDENT_AMBULATORY_CARE_PROVIDER_SITE_OTHER): Payer: Medicare HMO

## 2023-01-27 VITALS — BP 130/78 | HR 58 | Ht 61.0 in | Wt 263.0 lb

## 2023-01-27 DIAGNOSIS — M1990 Unspecified osteoarthritis, unspecified site: Secondary | ICD-10-CM | POA: Diagnosis not present

## 2023-01-27 DIAGNOSIS — R209 Unspecified disturbances of skin sensation: Secondary | ICD-10-CM

## 2023-01-27 DIAGNOSIS — M069 Rheumatoid arthritis, unspecified: Secondary | ICD-10-CM | POA: Diagnosis not present

## 2023-01-27 DIAGNOSIS — R208 Other disturbances of skin sensation: Secondary | ICD-10-CM | POA: Diagnosis not present

## 2023-01-27 LAB — VITAMIN B12: Vitamin B-12: 309 pg/mL (ref 211–911)

## 2023-01-27 MED ORDER — GABAPENTIN 100 MG PO CAPS
100.0000 mg | ORAL_CAPSULE | Freq: Two times a day (BID) | ORAL | 3 refills | Status: DC
Start: 2023-01-27 — End: 2023-07-26

## 2023-01-27 NOTE — Patient Instructions (Signed)
I saw you today for burning in your feet. This could be nerve related (neuropathy), but you neurologic exam does not clearly show this. I would like to investigate further with: -Blood work today -Nerve test of your legs called EMG (see below for more information)  I will be in touch when I have the results to discuss next steps.  I am prescribing a nerve pain medication, gabapentin 100 mg twice daily, to see if helps. This is a very low dose. I want to make sure it does not cause you problems. We may have to increase it to help with your pain though. Call or send me a message if you are doing okay on it but would like to try a higher dose.  The physicians and staff at [facility] are committed to providing excellent care. You may receive a survey requesting feedback about your experience at our office. We strive to receive "very good" responses to the survey questions. If you feel that your experience would prevent you from giving the office a "very good " response, please contact our office to try to remedy the situation. We may be reached at [phone]. Thank you for taking the time out of your busy day to complete the survey.  Jacquelyne Balint, MD [facility]  ELECTROMYOGRAM AND NERVE CONDUCTION STUDIES (EMG/NCS) INSTRUCTIONS  How to Prepare The neurologist conducting the EMG will need to know if you have certain medical conditions. Tell the neurologist and other EMG lab personnel if you: Have a pacemaker or any other electrical medical device Take blood-thinning medications Have hemophilia, a blood-clotting disorder that causes prolonged bleeding Bathing Take a shower or bath shortly before your exam in order to remove oils from your skin. Don't apply lotions or creams before the exam.  What to Expect You'll likely be asked to change into a hospital gown for the procedure and lie down on an examination table. The following explanations can help you understand what will happen  during the exam.  Electrodes. The neurologist or a technician places surface electrodes at various locations on your skin depending on where you're experiencing symptoms. Or the neurologist may insert needle electrodes at different sites depending on your symptoms.  Sensations. The electrodes will at times transmit a tiny electrical current that you may feel as a twinge or spasm. The needle electrode may cause discomfort or pain that usually ends shortly after the needle is removed. If you are concerned about discomfort or pain, you may want to talk to the neurologist about taking a short break during the exam.  Instructions. During the needle EMG, the neurologist will assess whether there is any spontaneous electrical activity when the muscle is at rest - activity that isn't present in healthy muscle tissue - and the degree of activity when you slightly contract the muscle.  He or she will give you instructions on resting and contracting a muscle at appropriate times. Depending on what muscles and nerves the neurologist is examining, he or she may ask you to change positions during the exam.  After your EMG You may experience some temporary, minor bruising where the needle electrode was inserted into your muscle. This bruising should fade within several days. If it persists, contact your primary care doctor.   Preventing Falls at [facility] are common, often dreaded events in the lives of older people. Aside from the obvious injuries and even death that may result, fall can cause wide-ranging consequences including loss of independence, mental decline,  decreased activity and mobility. Younger people are also at risk of falling, especially those with chronic illnesses and fatigue.  Ways to reduce risk for falling Examine diet and medications. Warm foods and alcohol dilate blood vessels, which can lead to dizziness when standing. Sleep aids, antidepressants and pain medications can also increase the  likelihood of a fall.  Get a vision exam. Poor vision, cataracts and glaucoma increase the chances of falling.  Check foot gear. Shoes should fit snugly and have a sturdy, nonskid sole and a broad, low heel  Participate in a physician-approved exercise program to build and maintain muscle strength and improve balance and coordination. Programs that use ankle weights or stretch bands are excellent for muscle-strengthening. Water aerobics programs and low-impact Tai Chi programs have also been shown to improve balance and coordination.  Increase vitamin D intake. Vitamin D improves muscle strength and increases the amount of calcium the body is able to absorb and deposit in bones.  How to prevent falls from common hazards Floors - Remove all loose wires, cords, and throw rugs. Minimize clutter. Make sure rugs are anchored and smooth. Keep furniture in its usual place.  Chairs -- Use chairs with straight backs, armrests and firm seats. Add firm cushions to existing pieces to add height.  Bathroom - Install grab bars and non-skid tape in the tub or shower. Use a bathtub transfer bench or a shower chair with a back support Use an elevated toilet seat and/or safety rails to assist standing from a low surface. Do not use towel racks or bathroom tissue holders to help you stand.  Lighting - Make sure halls, stairways, and entrances are well-lit. Install a night light in your bathroom or hallway. Make sure there is a light switch at the top and bottom of the staircase. Turn lights on if you get up in the middle of the night. Make sure lamps or light switches are within reach of the bed if you have to get up during the night.  Kitchen - Install non-skid rubber mats near the sink and stove. Clean spills immediately. Store frequently used utensils, pots, pans between waist and eye level. This helps prevent reaching and bending. Sit when getting things out of lower cupboards.  Living room/ Bedrooms - Place  furniture with wide spaces in between, giving enough room to move around. Establish a route through the living room that gives you something to hold onto as you walk.  Stairs - Make sure treads, rails, and rugs are secure. Install a rail on both sides of the stairs. If stairs are a threat, it might be helpful to arrange most of your activities on the lower level to reduce the number of times you must climb the stairs.  Entrances and doorways - Install metal handles on the walls adjacent to the doorknobs of all doors to make it more secure as you travel through the doorway.  Tips for maintaining balance Keep at least one hand free at all times. Try using a backpack or fanny pack to hold things rather than carrying them in your hands. Never carry objects in both hands when walking as this interferes with keeping your balance.  Attempt to swing both arms from front to back while walking. This might require a conscious effort if Parkinson's disease has diminished your movement. It will, however, help you to maintain balance and posture, and reduce fatigue.  Consciously lift your feet off of the ground when walking. Shuffling and dragging of the feet  is a common culprit in losing your balance.  When trying to navigate turns, use a "U" technique of facing forward and making a wide turn, rather than pivoting sharply.  Try to stand with your feet shoulder-length apart. When your feet are close together for any length of time, you increase your risk of losing your balance and falling.  Do one thing at a time. Don't try to walk and accomplish another task, such as reading or looking around. The decrease in your automatic reflexes complicates motor function, so the less distraction, the better.  Do not wear rubber or gripping soled shoes, they might "catch" on the floor and cause tripping.  Move slowly when changing positions. Use deliberate, concentrated movements and, if needed, use a grab bar or walking  aid. Count 15 seconds between each movement. For example, when rising from a seated position, wait 15 seconds after standing to begin walking.  If balance is a continuous problem, you might want to consider a walking aid such as a cane, walking stick, or walker. Once you've mastered walking with help, you might be ready to try it on your own again.

## 2023-01-30 LAB — VAS US PAD ABI
Left ABI: 1.08
Right ABI: 1.2

## 2023-02-05 ENCOUNTER — Other Ambulatory Visit: Payer: Self-pay | Admitting: Internal Medicine

## 2023-02-05 LAB — VITAMIN E
Gamma-Tocopherol (Vit E): 2.1 mg/L (ref <4.4)
Vitamin E (Alpha Tocopherol): 6.6 mg/L (ref 5.7–19.9)

## 2023-02-05 LAB — IMMUNOFIXATION ELECTROPHORESIS
IgG (Immunoglobin G), Serum: 1305 mg/dL (ref 600–1640)
IgM, Serum: 234 mg/dL (ref 50–300)
Immunoglobulin A: 859 mg/dL — ABNORMAL HIGH (ref 47–310)

## 2023-02-05 LAB — VITAMIN B1: Vitamin B1 (Thiamine): 7 nmol/L — ABNORMAL LOW (ref 8–30)

## 2023-02-05 LAB — COPPER, SERUM: Copper: 121 ug/dL (ref 70–175)

## 2023-02-07 ENCOUNTER — Encounter: Payer: Self-pay | Admitting: Neurology

## 2023-02-07 ENCOUNTER — Telehealth: Payer: Self-pay | Admitting: Internal Medicine

## 2023-02-07 MED ORDER — FUROSEMIDE 40 MG PO TABS: 40.0000 mg | ORAL_TABLET | Freq: Every day | ORAL | 2 refills | Status: DC

## 2023-02-07 NOTE — Telephone Encounter (Signed)
*  STAT* If patient is at the pharmacy, call can be transferred to refill team.   1. Which medications need to be refilled? (please list name of each medication and dose if known) furosemide (LASIX) 40 MG tablet    2. Would you like to learn more about the convenience, safety, & potential cost savings by using the [facility]? No      3. Are you open to using the [facility] (Type [facility]. No ).   4. Which pharmacy/location (including street and city if local pharmacy) is medication to be sent to? [facility] - [place], [place] - [address]    5. Do they need a 30 day or 90 day supply? 90

## 2023-02-07 NOTE — Telephone Encounter (Signed)
Medication refill request completed.  

## 2023-02-07 NOTE — Telephone Encounter (Signed)
Patient called and said said that her neurologist told her that she needs to take vitamin B12 and vitamin E pills. Wants to know if it is ok to take vitamins with medication

## 2023-02-07 NOTE — Telephone Encounter (Signed)
Pt notified that it is ok to start supplements as recommended.

## 2023-02-07 NOTE — Telephone Encounter (Signed)
Ok to take supplements as recommended by neurologist.

## 2023-02-10 DIAGNOSIS — G894 Chronic pain syndrome: Secondary | ICD-10-CM | POA: Diagnosis not present

## 2023-02-10 DIAGNOSIS — I480 Paroxysmal atrial fibrillation: Secondary | ICD-10-CM | POA: Diagnosis not present

## 2023-02-10 DIAGNOSIS — M159 Polyosteoarthritis, unspecified: Secondary | ICD-10-CM | POA: Diagnosis not present

## 2023-02-10 DIAGNOSIS — I255 Ischemic cardiomyopathy: Secondary | ICD-10-CM | POA: Diagnosis not present

## 2023-02-28 ENCOUNTER — Other Ambulatory Visit: Payer: Self-pay

## 2023-02-28 ENCOUNTER — Emergency Department (HOSPITAL_COMMUNITY): Payer: Medicare HMO

## 2023-02-28 ENCOUNTER — Emergency Department (HOSPITAL_COMMUNITY)
Admission: EM | Admit: 2023-02-28 | Discharge: 2023-02-28 | Discharge status: Against Medical Advice | Payer: Medicare HMO | Attending: Emergency Medicine | Admitting: Emergency Medicine

## 2023-02-28 ENCOUNTER — Encounter (HOSPITAL_COMMUNITY): Payer: Self-pay

## 2023-02-28 DIAGNOSIS — M25562 Pain in left knee: Secondary | ICD-10-CM | POA: Diagnosis not present

## 2023-02-28 DIAGNOSIS — R2242 Localized swelling, mass and lump, left lower limb: Secondary | ICD-10-CM | POA: Diagnosis present

## 2023-02-28 DIAGNOSIS — R7989 Other specified abnormal findings of blood chemistry: Secondary | ICD-10-CM | POA: Diagnosis not present

## 2023-02-28 DIAGNOSIS — R609 Edema, unspecified: Secondary | ICD-10-CM | POA: Diagnosis not present

## 2023-02-28 DIAGNOSIS — R2241 Localized swelling, mass and lump, right lower limb: Secondary | ICD-10-CM | POA: Insufficient documentation

## 2023-02-28 DIAGNOSIS — M7989 Other specified soft tissue disorders: Secondary | ICD-10-CM | POA: Diagnosis not present

## 2023-02-28 DIAGNOSIS — Z5321 Procedure and treatment not carried out due to patient leaving prior to being seen by health care provider: Secondary | ICD-10-CM | POA: Insufficient documentation

## 2023-02-28 DIAGNOSIS — Z743 Need for continuous supervision: Secondary | ICD-10-CM | POA: Diagnosis not present

## 2023-02-28 DIAGNOSIS — R918 Other nonspecific abnormal finding of lung field: Secondary | ICD-10-CM | POA: Diagnosis not present

## 2023-02-28 LAB — CBC WITH DIFFERENTIAL/PLATELET
Abs Immature Granulocytes: 0.05 10*3/uL (ref 0.00–0.07)
Basophils Absolute: 0.1 10*3/uL (ref 0.0–0.1)
Basophils Relative: 0 %
Eosinophils Absolute: 0.1 10*3/uL (ref 0.0–0.5)
Eosinophils Relative: 1 %
HCT: 35 % — ABNORMAL LOW (ref 36.0–46.0)
Hemoglobin: 10.2 g/dL — ABNORMAL LOW (ref 12.0–15.0)
Immature Granulocytes: 0 %
Lymphocytes Relative: 9 %
Lymphs Abs: 1.1 10*3/uL (ref 0.7–4.0)
MCH: 25.8 pg — ABNORMAL LOW (ref 26.0–34.0)
MCHC: 29.1 g/dL — ABNORMAL LOW (ref 30.0–36.0)
MCV: 88.4 fL (ref 80.0–100.0)
Monocytes Absolute: 0.9 10*3/uL (ref 0.1–1.0)
Monocytes Relative: 7 %
Neutro Abs: 9.9 10*3/uL — ABNORMAL HIGH (ref 1.7–7.7)
Neutrophils Relative %: 83 %
Platelets: 320 10*3/uL (ref 150–400)
RBC: 3.96 MIL/uL (ref 3.87–5.11)
RDW: 16.3 % — ABNORMAL HIGH (ref 11.5–15.5)
WBC: 12 10*3/uL — ABNORMAL HIGH (ref 4.0–10.5)
nRBC: 0 % (ref 0.0–0.2)

## 2023-02-28 LAB — COMPREHENSIVE METABOLIC PANEL
ALT: 21 U/L (ref 0–44)
AST: 19 U/L (ref 15–41)
Albumin: 2.7 g/dL — ABNORMAL LOW (ref 3.5–5.0)
Alkaline Phosphatase: 86 U/L (ref 38–126)
Anion gap: 12 (ref 5–15)
BUN: 19 mg/dL (ref 6–20)
CO2: 24 mmol/L (ref 22–32)
Calcium: 8.7 mg/dL — ABNORMAL LOW (ref 8.9–10.3)
Chloride: 101 mmol/L (ref 98–111)
Creatinine, Ser: 0.86 mg/dL (ref 0.44–1.00)
GFR, Estimated: >60 mL/min (ref >60)
Glucose, Bld: 125 mg/dL — ABNORMAL HIGH (ref 70–99)
Potassium: 3.9 mmol/L (ref 3.5–5.1)
Sodium: 137 mmol/L (ref 135–145)
Total Bilirubin: 0.8 mg/dL (ref 0.3–1.2)
Total Protein: 7.6 g/dL (ref 6.5–8.1)

## 2023-02-28 LAB — TROPONIN I (HIGH SENSITIVITY)
Troponin I (High Sensitivity): 5 ng/L (ref <18)
Troponin I (High Sensitivity): 5 ng/L (ref <18)

## 2023-02-28 LAB — BRAIN NATRIURETIC PEPTIDE: B Natriuretic Peptide: 45 pg/mL (ref 0.0–100.0)

## 2023-02-28 NOTE — ED Triage Notes (Signed)
Pt brought from home via EMS  Increased B/L LE's swelling  Worse on left  Pt stated she decreased her lasix to 20mg  from 40mg  so she did not have to keep walking to restroom .

## 2023-02-28 NOTE — ED Notes (Signed)
Reassessed Vitals  Pain 7/10 Waiting for provider eval

## 2023-02-28 NOTE — ED Notes (Signed)
Called pt back to [facility] for MSE Pt no longer in lobby, informed that pt left

## 2023-02-28 NOTE — ED Notes (Signed)
Pt brought to restroom.

## 2023-03-07 DIAGNOSIS — I255 Ischemic cardiomyopathy: Secondary | ICD-10-CM | POA: Diagnosis not present

## 2023-03-07 DIAGNOSIS — M2550 Pain in unspecified joint: Secondary | ICD-10-CM | POA: Diagnosis not present

## 2023-03-07 DIAGNOSIS — I110 Hypertensive heart disease with heart failure: Secondary | ICD-10-CM | POA: Diagnosis not present

## 2023-03-07 DIAGNOSIS — G894 Chronic pain syndrome: Secondary | ICD-10-CM | POA: Diagnosis not present

## 2023-03-07 DIAGNOSIS — M19079 Primary osteoarthritis, unspecified ankle and foot: Secondary | ICD-10-CM | POA: Diagnosis not present

## 2023-03-15 ENCOUNTER — Other Ambulatory Visit: Payer: Self-pay

## 2023-03-15 ENCOUNTER — Encounter: Payer: Medicare HMO | Admitting: Neurology

## 2023-03-15 ENCOUNTER — Emergency Department (HOSPITAL_COMMUNITY)
Admission: EM | Admit: 2023-03-15 | Discharge: 2023-03-15 | Disposition: A | Discharge status: Home / Self Care | Payer: Medicare HMO | Attending: Emergency Medicine | Admitting: Emergency Medicine

## 2023-03-15 ENCOUNTER — Emergency Department (HOSPITAL_COMMUNITY): Payer: Medicare HMO

## 2023-03-15 ENCOUNTER — Encounter (HOSPITAL_COMMUNITY): Payer: Self-pay

## 2023-03-15 DIAGNOSIS — J449 Chronic obstructive pulmonary disease, unspecified: Secondary | ICD-10-CM | POA: Insufficient documentation

## 2023-03-15 DIAGNOSIS — R600 Localized edema: Secondary | ICD-10-CM | POA: Diagnosis not present

## 2023-03-15 DIAGNOSIS — Z7901 Long term (current) use of anticoagulants: Secondary | ICD-10-CM | POA: Insufficient documentation

## 2023-03-15 DIAGNOSIS — M79604 Pain in right leg: Secondary | ICD-10-CM | POA: Diagnosis not present

## 2023-03-15 DIAGNOSIS — M25461 Effusion, right knee: Secondary | ICD-10-CM | POA: Diagnosis not present

## 2023-03-15 DIAGNOSIS — M79661 Pain in right lower leg: Secondary | ICD-10-CM | POA: Diagnosis not present

## 2023-03-15 DIAGNOSIS — Z7982 Long term (current) use of aspirin: Secondary | ICD-10-CM | POA: Diagnosis not present

## 2023-03-15 DIAGNOSIS — M79605 Pain in left leg: Secondary | ICD-10-CM | POA: Insufficient documentation

## 2023-03-15 DIAGNOSIS — R6889 Other general symptoms and signs: Secondary | ICD-10-CM | POA: Diagnosis not present

## 2023-03-15 DIAGNOSIS — M1711 Unilateral primary osteoarthritis, right knee: Secondary | ICD-10-CM | POA: Diagnosis not present

## 2023-03-15 DIAGNOSIS — M25561 Pain in right knee: Secondary | ICD-10-CM | POA: Diagnosis not present

## 2023-03-15 DIAGNOSIS — M059 Rheumatoid arthritis with rheumatoid factor, unspecified: Secondary | ICD-10-CM | POA: Diagnosis not present

## 2023-03-15 DIAGNOSIS — I2510 Atherosclerotic heart disease of native coronary artery without angina pectoris: Secondary | ICD-10-CM | POA: Diagnosis not present

## 2023-03-15 DIAGNOSIS — R609 Edema, unspecified: Secondary | ICD-10-CM | POA: Diagnosis not present

## 2023-03-15 DIAGNOSIS — Z743 Need for continuous supervision: Secondary | ICD-10-CM | POA: Diagnosis not present

## 2023-03-15 LAB — CBC WITH DIFFERENTIAL/PLATELET
Abs Immature Granulocytes: 0.03 10*3/uL (ref 0.00–0.07)
Basophils Absolute: 0 10*3/uL (ref 0.0–0.1)
Basophils Relative: 1 %
Eosinophils Absolute: 0.1 10*3/uL (ref 0.0–0.5)
Eosinophils Relative: 2 %
HCT: 32.6 % — ABNORMAL LOW (ref 36.0–46.0)
Hemoglobin: 9.9 g/dL — ABNORMAL LOW (ref 12.0–15.0)
Immature Granulocytes: 0 %
Lymphocytes Relative: 19 %
Lymphs Abs: 1.5 10*3/uL (ref 0.7–4.0)
MCH: 26.3 pg (ref 26.0–34.0)
MCHC: 30.4 g/dL (ref 30.0–36.0)
MCV: 86.5 fL (ref 80.0–100.0)
Monocytes Absolute: 0.8 10*3/uL (ref 0.1–1.0)
Monocytes Relative: 10 %
Neutro Abs: 5.4 10*3/uL (ref 1.7–7.7)
Neutrophils Relative %: 68 %
Platelets: 368 10*3/uL (ref 150–400)
RBC: 3.77 MIL/uL — ABNORMAL LOW (ref 3.87–5.11)
RDW: 16.3 % — ABNORMAL HIGH (ref 11.5–15.5)
WBC: 7.9 10*3/uL (ref 4.0–10.5)
nRBC: 0 % (ref 0.0–0.2)

## 2023-03-15 LAB — COMPREHENSIVE METABOLIC PANEL
ALT: 13 U/L (ref 0–44)
AST: 14 U/L — ABNORMAL LOW (ref 15–41)
Albumin: 2.7 g/dL — ABNORMAL LOW (ref 3.5–5.0)
Alkaline Phosphatase: 78 U/L (ref 38–126)
Anion gap: 11 (ref 5–15)
BUN: 14 mg/dL (ref 6–20)
CO2: 26 mmol/L (ref 22–32)
Calcium: 8.7 mg/dL — ABNORMAL LOW (ref 8.9–10.3)
Chloride: 101 mmol/L (ref 98–111)
Creatinine, Ser: 0.82 mg/dL (ref 0.44–1.00)
GFR, Estimated: >60 mL/min (ref >60)
Glucose, Bld: 126 mg/dL — ABNORMAL HIGH (ref 70–99)
Potassium: 3.6 mmol/L (ref 3.5–5.1)
Sodium: 138 mmol/L (ref 135–145)
Total Bilirubin: 0.8 mg/dL (ref 0.3–1.2)
Total Protein: 7.5 g/dL (ref 6.5–8.1)

## 2023-03-15 LAB — BRAIN NATRIURETIC PEPTIDE: B Natriuretic Peptide: 72 pg/mL (ref 0.0–100.0)

## 2023-03-15 MED ORDER — HYDROMORPHONE HCL 1 MG/ML IJ SOLN
0.5000 mg | Freq: Once | INTRAMUSCULAR | Status: AC
  Administered 2023-03-15: 0.5 mg via INTRAVENOUS
  Filled 2023-03-15: qty 0.5

## 2023-03-15 MED ORDER — LORAZEPAM 2 MG/ML IJ SOLN
0.5000 mg | Freq: Once | INTRAMUSCULAR | Status: AC
  Administered 2023-03-15: 0.5 mg via INTRAVENOUS
  Filled 2023-03-15: qty 1

## 2023-03-15 MED ORDER — FUROSEMIDE 10 MG/ML IJ SOLN
40.0000 mg | Freq: Once | INTRAMUSCULAR | Status: AC
Start: 2023-03-15 — End: 2023-03-15
  Administered 2023-03-15: 40 mg via INTRAVENOUS
  Filled 2023-03-15: qty 4

## 2023-03-15 MED ORDER — OXYCODONE-ACETAMINOPHEN 5-325 MG PO TABS
1.0000 | ORAL_TABLET | Freq: Four times a day (QID) | ORAL | 0 refills | Status: DC | PRN
Start: 2023-03-15 — End: 2023-04-10

## 2023-03-15 MED ORDER — OXYCODONE-ACETAMINOPHEN 5-325 MG PO TABS
1.0000 | ORAL_TABLET | Freq: Once | ORAL | Status: AC
  Administered 2023-03-15: 1 via ORAL
  Filled 2023-03-15: qty 1

## 2023-03-15 NOTE — ED Triage Notes (Signed)
Pt reports bilateral lower leg pain and swelling x 2 weeks.  Reports PCP told her to increase her lasix from 20mg  to 40 mig but she felt like that made her knees hurt worse so she stopped the increase.

## 2023-03-15 NOTE — ED Provider Notes (Signed)
[facility] Provider Note   CSN: [id number] Arrival date & time: 03/15/23  1140     History {Add pertinent medical, surgical, social history, OB history to HPI:1} Chief Complaint  Patient presents with   Leg Pain    Kristine Montgomery is a 57 y.o. female.  Patient has a history of COPD, coronary artery disease,   Leg Pain      Home Medications Prior to Admission medications   Medication Sig Start Date End Date Taking? Authorizing Provider  apixaban (ELIQUIS) 5 MG TABS tablet Take 1 tablet (5 mg total) by mouth 2 (two) times daily. TAKE (1) TABLET BY MOUTH TWICE DAILY. 01/18/23   Pricilla Riffle, MD  aspirin EC 81 MG tablet Take 81 mg by mouth daily. Swallow whole.    [provider]  atorvastatin (LIPITOR) 80 MG tablet TAKE (1) TABLET BY MOUTH ONCE DAILY AT 6 PM 08/20/22   Pricilla Riffle, MD  dapagliflozin propanediol (FARXIGA) 10 MG TABS tablet TAKE 1 TABLET BY MOUTH ONCE A DAY. 04/22/22   Pricilla Riffle, MD  diclofenac Sodium (VOLTAREN) 1 % GEL Apply topically. Patient not taking: Reported on 01/27/2023 04/28/22   [provider]  dronedarone (MULTAQ) 400 MG tablet Take 400 mg by mouth 2 (two) times daily with a meal. Patient not taking: Reported on 01/27/2023    [provider]  famotidine (PEPCID) 40 MG tablet TAKE ONE TABLET BY MOUTH IN THE EVENING. 11/24/22   Pricilla Riffle, MD  folic acid (FOLVITE) 1 MG tablet Take 1 tablet (1 mg total) by mouth daily. Patient not taking: Reported on 01/27/2023 04/26/22   Fuller Plan, MD  furosemide (LASIX) 20 MG tablet TAKE (1) TABLET BY MOUTH ONCE DAILY. TAKE AN ADDITIONAL 1 TABLET FOR 3 POUND WEIGHT GAIN OVERNIGHT OR 5 POUNDS IN A WEEK. 02/07/23   Pricilla Riffle, MD  furosemide (LASIX) 40 MG tablet Take 1 tablet (40 mg total) by mouth daily. 02/07/23   Pricilla Riffle, MD  gabapentin (NEURONTIN) 100 MG capsule Take 1 capsule (100 mg total) by mouth 2 (two) times daily.  01/27/23   Antony Madura, MD  HYDROcodone-acetaminophen (NORCO) 10-325 MG tablet Take 1-2 tablets by mouth every 6 (six) hours as needed. 10/27/21   [provider]  meloxicam (MOBIC) 7.5 MG tablet Take 7.5 mg by mouth 2 (two) times daily. Patient not taking: Reported on 01/27/2023 06/23/22   [provider]  methotrexate (RHEUMATREX) 2.5 MG tablet Take 6 tablets (15 mg total) by mouth once a week. Caution:Chemotherapy. Protect from light. Patient not taking: Reported on 01/27/2023 04/26/22   Fuller Plan, MD  methylPREDNISolone (MEDROL DOSEPAK) 4 MG TBPK tablet 6 day dose pack - take as directed Patient not taking: Reported on 01/27/2023 12/27/22   Edwin Cap, DPM  metoprolol succinate (TOPROL-XL) 25 MG 24 hr tablet TAKE 1/2 TABLET BY MOUTH ONCE DAILY. 06/07/22   Pricilla Riffle, MD  nitroGLYCERIN (NITROSTAT) 0.4 MG SL tablet PLACE 1 TABLET (0.4 MG TOTAL) UNDER THE TONGUE EVERY FIVE MINUTES X 3 DOSES AS NEEDED FOR CHEST PAIN. Patient taking differently: Place 0.4 mg under the tongue every 5 (five) minutes as needed for chest pain. 07/31/20 01/27/23  Bhagat, Sharrell Ku, PA  potassium chloride SA (KLOR-CON M) 20 MEQ tablet Take 20 meq by mouth twice daily for 5 days then take 20 meq by mouth daily thereafter Patient not taking: Reported on 01/27/2023 07/07/22  Sharlene Dory, PA-C  predniSONE (DELTASONE) 20 MG tablet Take 2 tablets (40 mg total) by mouth daily. Patient not taking: Reported on 01/27/2023 09/02/22   Renne Crigler, PA-C  sacubitril-valsartan (ENTRESTO) 24-26 MG Take 1 tablet by mouth 2 (two) times daily. 01/18/23   Pricilla Riffle, MD  zinc oxide [facility] ZINC OXIDE) 20 % ointment Apply 1 Application topically as needed for irritation. Patient not taking: Reported on 01/27/2023 07/20/22   Al Decant, PA-C      Allergies    Patient has no known allergies.    Review of Systems   Review of Systems  Physical Exam Updated Vital Signs BP 122/61 (BP Location:  Right Arm)   Pulse 79   Temp 98.4 F (36.9 C) (Oral)   Resp 18   Ht 5\' 1"  (1.549 m)   Wt 119.7 kg   LMP 06/27/2017 (Within Weeks)   SpO2 97%   BMI 49.88 kg/m  Physical Exam  ED Results / Procedures / Treatments   Labs (all labs ordered are listed, but only abnormal results are displayed) Labs Reviewed  CBC WITH DIFFERENTIAL/PLATELET - Abnormal; Notable for the following components:      Result Value   RBC 3.77 (*)    Hemoglobin 9.9 (*)    HCT 32.6 (*)    RDW 16.3 (*)    All other components within normal limits  COMPREHENSIVE METABOLIC PANEL - Abnormal; Notable for the following components:   Glucose, Bld 126 (*)    Calcium 8.7 (*)    Albumin 2.7 (*)    AST 14 (*)    All other components within normal limits  BRAIN NATRIURETIC PEPTIDE    EKG None  Radiology No results found.  Procedures Procedures  {Document cardiac monitor, telemetry assessment procedure when appropriate:1}  Medications Ordered in ED Medications  oxyCODONE-acetaminophen (PERCOCET/ROXICET) 5-325 MG per tablet 1 tablet (1 tablet Oral Given 03/15/23 1344)  HYDROmorphone (DILAUDID) injection 0.5 mg (0.5 mg Intravenous Given 03/15/23 1426)  LORazepam (ATIVAN) injection 0.5 mg (0.5 mg Intravenous Given 03/15/23 1425)    ED Course/ Medical Decision Making/ A&P   {   Click here for ABCD2, HEART and other calculatorsREFRESH Note before signing :1}                              Medical Decision Making Amount and/or Complexity of Data Reviewed Labs: ordered. Radiology: ordered.  Risk Prescription drug management.   ***  {Document critical care time when appropriate:1} {Document review of labs and clinical decision tools ie heart score, Chads2Vasc2 etc:1}  {Document your independent review of radiology images, and any outside records:1} {Document your discussion with family members, caretakers, and with consultants:1} {Document social determinants of health affecting pt's care:1} {Document  your decision making why or why not admission, treatments were needed:1} Final Clinical Impression(s) / ED Diagnoses Final diagnoses:  None    Rx / DC Orders ED Discharge Orders     None

## 2023-03-15 NOTE — Discharge Instructions (Addendum)
Follow-up with your orthopedic doctor and your heart doctor within the next week.  Increase your Lasix to the 40 mg that your cardiologist wanted you to take

## 2023-03-17 ENCOUNTER — Ambulatory Visit: Payer: Medicare HMO | Admitting: Orthopedic Surgery

## 2023-03-29 ENCOUNTER — Encounter: Payer: Self-pay | Admitting: Internal Medicine

## 2023-03-29 ENCOUNTER — Ambulatory Visit: Payer: Medicare HMO | Attending: Internal Medicine | Admitting: Internal Medicine

## 2023-03-29 VITALS — BP 110/82 | HR 66 | Ht 61.0 in | Wt 276.4 lb

## 2023-03-29 DIAGNOSIS — M059 Rheumatoid arthritis with rheumatoid factor, unspecified: Secondary | ICD-10-CM | POA: Diagnosis not present

## 2023-03-29 DIAGNOSIS — I480 Paroxysmal atrial fibrillation: Secondary | ICD-10-CM | POA: Diagnosis not present

## 2023-03-29 DIAGNOSIS — Z79899 Other long term (current) drug therapy: Secondary | ICD-10-CM | POA: Diagnosis not present

## 2023-03-29 MED ORDER — TORSEMIDE 20 MG PO TABS: 20.0000 mg | ORAL_TABLET | Freq: Every day | ORAL | 3 refills | Status: DC

## 2023-03-29 NOTE — Addendum Note (Signed)
Addended by: Kerney Elbe on: 03/29/2023 02:03 PM   Modules accepted: Orders

## 2023-03-29 NOTE — Progress Notes (Signed)
HPI Ms. Hathway returns for evaluation of atrial fib. She is a morbidly obese woman with a h/o CAD s/p MI, s/P PCI who developed atrial fib and was placed on amiodarone and then switched to Multaq. She has maintained NSR. Her EF was down but most recently has normalized. I saw her last over a year ago. In the interim she notes she has nocturia and peripheral edema. She admits to dietary indiscretion. No chest pain.   No Known Allergies   Current Outpatient Medications  Medication Sig Dispense Refill   apixaban (ELIQUIS) 5 MG TABS tablet Take 1 tablet (5 mg total) by mouth 2 (two) times daily. TAKE (1) TABLET BY MOUTH TWICE DAILY. 60 tablet 5   aspirin EC 81 MG tablet Take 81 mg by mouth daily. Swallow whole.     atorvastatin (LIPITOR) 80 MG tablet TAKE (1) TABLET BY MOUTH ONCE DAILY AT 6 PM (Patient taking differently: Take 80 mg by mouth daily.) 90 tablet 3   dapagliflozin propanediol (FARXIGA) 10 MG TABS tablet TAKE 1 TABLET BY MOUTH ONCE A DAY. 90 tablet 2   dronedarone (MULTAQ) 400 MG tablet Take 400 mg by mouth 2 (two) times daily with a meal.     famotidine (PEPCID) 40 MG tablet TAKE ONE TABLET BY MOUTH IN THE EVENING. 90 tablet 3   furosemide (LASIX) 40 MG tablet Take 1 tablet (40 mg total) by mouth daily. 30 tablet 2   gabapentin (NEURONTIN) 100 MG capsule Take 1 capsule (100 mg total) by mouth 2 (two) times daily. 60 capsule 3   metoprolol succinate (TOPROL-XL) 25 MG 24 hr tablet TAKE 1/2 TABLET BY MOUTH ONCE DAILY. 45 tablet 3   nitroGLYCERIN (NITROSTAT) 0.4 MG SL tablet PLACE 1 TABLET (0.4 MG TOTAL) UNDER THE TONGUE EVERY FIVE MINUTES X 3 DOSES AS NEEDED FOR CHEST PAIN. (Patient taking differently: Place 0.4 mg under the tongue every 5 (five) minutes as needed for chest pain.) 25 tablet 12   oxyCODONE-acetaminophen (PERCOCET/ROXICET) 5-325 MG tablet Take 1 tablet by mouth every 6 (six) hours as needed for severe pain (pain score 7-10). 20 tablet 0   sacubitril-valsartan  (ENTRESTO) 24-26 MG Take 1 tablet by mouth 2 (two) times daily. 180 tablet 2   No current facility-administered medications for this visit.     Past Medical History:  Diagnosis Date   Acute combined systolic and diastolic CHF, NYHA class 4 (HCC) 07/29/2020   a. EF 30% by echo in 07/2020 b. EF 55-60% by echo in 11/2020   Arthritis    Asthma    CAD (coronary artery disease)    a. 07/2020: s/p NSTEMI with DES to proxLAD and DES to proxLCx with medical management recommended of the occluded RCA.   Chronic bronchitis (HCC)    COPD (chronic obstructive pulmonary disease) (HCC)    Edema extremities    History of hiatal hernia    Hyperlipidemia LDL goal <70 07/29/2020   Ovarian cyst    Pneumonia    Rheumatoid arthritis (HCC)     ROS:   All systems reviewed and negative except as noted in the HPI.   Past Surgical History:  Procedure Laterality Date   CESAREAN SECTION     CORONARY STENT INTERVENTION N/A 07/29/2020   Procedure: CORONARY STENT INTERVENTION;  Surgeon: Lennette Bihari, MD;  Location: [facility];  Service: Cardiovascular;  Laterality: N/A;   KNEE ARTHROSCOPY WITH MEDIAL MENISECTOMY Right 05/23/2020   Procedure: KNEE ARTHROSCOPY WITH MEDIAL  AND LATERAL MENISECTOMY; 3 COMPARTMENT SYNOVECTOMY;  Surgeon: Vickki Hearing, MD;  Location: [facility];  Service: Orthopedics;  Laterality: Right;   RIGHT/LEFT HEART CATH AND CORONARY ANGIOGRAPHY N/A 07/29/2020   Procedure: RIGHT/LEFT HEART CATH AND CORONARY ANGIOGRAPHY;  Surgeon: Lennette Bihari, MD;  Location: [facility];  Service: Cardiovascular;  Laterality: N/A;   TONSILLECTOMY     TUBAL LIGATION       Family History  Problem Relation Age of Onset   Cancer Paternal Grandfather        colon   Cancer Maternal Grandmother        lung   COPD Father    Non-Hodgkin's lymphoma Mother    Diabetes Mother    Cancer Mother        cervical   Congestive Heart Failure Mother    Hypertension Sister    Other Son         fluid around heart     Social History   Socioeconomic History   Marital status: Single    Spouse name: Not on file   Number of children: 2   Years of education: 12   Highest education level: Not on file  Occupational History   Not on file  Tobacco Use   Smoking status: Former    Current packs/day: 0.00    Average packs/day: 0.5 packs/day for 30.0 years (15.0 ttl pk-yrs)    Types: Cigarettes    Start date: 07/1990    Quit date: 07/2020    Years since quitting: 2.7    Passive exposure: Past   Smokeless tobacco: Never  Vaping Use   Vaping status: Never Used  Substance and Sexual Activity   Alcohol use: Not Currently   Drug use: No   Sexual activity: Yes    Birth control/protection: Post-menopausal, Surgical    Comment: tubal  Other Topics Concern   Not on file  Social History Narrative   Are you right handed or left handed? Right   Are you currently employed ?    What is your current occupation? disabled   Do you live at home alone?yes   Who lives with you?    What type of home do you live in: 1 story or 2 story? one   Caffeine non    Social Determinants of Health   Financial Resource Strain: Low Risk  (07/30/2020)   Overall Financial Resource Strain (CARDIA)    Difficulty of Paying Living Expenses: Not very hard  Food Insecurity: No Food Insecurity (04/09/2020)   Hunger Vital Sign    Worried About Running Out of Food in the Last Year: Never true    Ran Out of Food in the Last Year: Never true  Transportation Needs: No Transportation Needs (04/09/2020)   PRAPARE - Administrator, Civil Service (Medical): No    Lack of Transportation (Non-Medical): No  Physical Activity: Insufficiently Active (04/09/2020)   Exercise Vital Sign    Days of Exercise per Week: 1 day    Minutes of Exercise per Session: 10 min  Stress: No Stress Concern Present (04/09/2020)   [employer] of Occupational Health - Occupational Stress Questionnaire    Feeling of Stress  : Not at all  Social Connections: Socially Isolated (07/30/2020)   Social Connection and Isolation Panel [NHANES]    Frequency of Communication with Friends and Family: More than three times a week    Frequency of Social Gatherings with Friends and Family: More than three times a  week    Attends Religious Services: Never    Active Member of Clubs or Organizations: No    Attends Banker Meetings: Never    Marital Status: Widowed  Intimate Partner Violence: Not At Risk (04/09/2020)   Humiliation, Afraid, Rape, and Kick questionnaire    Fear of Current or Ex-Partner: No    Emotionally Abused: No    Physically Abused: No    Sexually Abused: No     BP 110/82   Pulse 66   Ht 5\' 1"  (1.549 m)   Wt 276 lb 6.4 oz (125.4 kg)   LMP 06/27/2017 (Within Weeks)   SpO2 99%   BMI 52.23 kg/m   Physical Exam:  Well appearing NAD HEENT: Unremarkable Neck:  No JVD, no thyromegally Lymphatics:  No adenopathy Back:  No CVA tenderness Lungs:  Clear with no wheezes HEART:  Regular rate rhythm, no murmurs, no rubs, no clicks Abd:  soft, positive bowel sounds, no organomegally, no rebound, no guarding Ext:  2 plus pulses, no edema, no cyanosis, no clubbing Skin:  No rashes no nodules Neuro:  CN II through XII intact, motor grossly intact  DEVICE  Normal device function.  See PaceArt for details.   Assess/Plan:  PAF - she is maintaining NSR on multaq. She might be a candidate for an ablation if she can lose some more weight. Obesity - she has gained back the 30 lbs and I have encouraged her to lose weight.  HTN - her bp is controlled. Chronic systolic heart failure - her EF has normalized. She will continue her current meds. I encouraged her to avoid salty food.   Sharlot Gowda Shritha Bresee,MD

## 2023-03-29 NOTE — Patient Instructions (Signed)
Medication Instructions:  Your physician has recommended you make the following change in your medication:   Stop Taking Lasix  Start Taking Torsemide 20 mg Daily   *If you need a refill on your cardiac medications before your next appointment, please call your pharmacy*   Lab Work: Your physician recommends that you return for lab work in: November 25 ( When you go see Dr. Sherwood Gambler )  If you have labs (blood work) drawn today and your tests are completely normal, you will receive your results only by: MyChart Message (if you have MyChart) OR A paper copy in the mail If you have any lab test that is abnormal or we need to change your treatment, we will call you to review the results.   Testing/Procedures: NONE    Follow-Up: At [facility], you and your health needs are our priority.  As part of our continuing mission to provide you with exceptional heart care, we have created designated Provider Care Teams.  These Care Teams include your primary Cardiologist (physician) and Advanced Practice Providers (APPs -  Physician Assistants and Nurse Practitioners) who all work together to provide you with the care you need, when you need it.  We recommend signing up for the patient portal called "MyChart".  Sign up information is provided on this After Visit Summary.  MyChart is used to connect with patients for Virtual Visits (Telemedicine).  Patients are able to view lab/test results, encounter notes, upcoming appointments, etc.  Non-urgent messages can be sent to your provider as well.   To learn more about what you can do with MyChart, go to [email or web address].    Your next appointment:   1 year(s)  Provider:   Lewayne Bunting, MD    Other Instructions Thank you for choosing [facility]!

## 2023-04-10 ENCOUNTER — Encounter (HOSPITAL_COMMUNITY): Payer: Self-pay

## 2023-04-10 ENCOUNTER — Emergency Department (HOSPITAL_COMMUNITY): Payer: Medicare HMO

## 2023-04-10 ENCOUNTER — Emergency Department (HOSPITAL_COMMUNITY)
Admission: EM | Admit: 2023-04-10 | Discharge: 2023-04-10 | Disposition: A | Discharge status: Home / Self Care | Payer: Medicare HMO | Attending: Emergency Medicine | Admitting: Emergency Medicine

## 2023-04-10 ENCOUNTER — Other Ambulatory Visit: Payer: Self-pay

## 2023-04-10 DIAGNOSIS — Z7901 Long term (current) use of anticoagulants: Secondary | ICD-10-CM | POA: Diagnosis not present

## 2023-04-10 DIAGNOSIS — M25562 Pain in left knee: Secondary | ICD-10-CM | POA: Diagnosis not present

## 2023-04-10 DIAGNOSIS — Z7982 Long term (current) use of aspirin: Secondary | ICD-10-CM | POA: Insufficient documentation

## 2023-04-10 DIAGNOSIS — G8929 Other chronic pain: Secondary | ICD-10-CM | POA: Diagnosis not present

## 2023-04-10 DIAGNOSIS — E119 Type 2 diabetes mellitus without complications: Secondary | ICD-10-CM | POA: Diagnosis not present

## 2023-04-10 DIAGNOSIS — I509 Heart failure, unspecified: Secondary | ICD-10-CM | POA: Diagnosis not present

## 2023-04-10 DIAGNOSIS — M25462 Effusion, left knee: Secondary | ICD-10-CM | POA: Diagnosis not present

## 2023-04-10 DIAGNOSIS — Z8739 Personal history of other diseases of the musculoskeletal system and connective tissue: Secondary | ICD-10-CM

## 2023-04-10 DIAGNOSIS — M1712 Unilateral primary osteoarthritis, left knee: Secondary | ICD-10-CM | POA: Diagnosis not present

## 2023-04-10 MED ORDER — PREDNISONE 20 MG PO TABS
40.0000 mg | ORAL_TABLET | Freq: Once | ORAL | Status: AC
Start: 2023-04-10 — End: 2023-04-10
  Administered 2023-04-10: 40 mg via ORAL
  Filled 2023-04-10: qty 2

## 2023-04-10 MED ORDER — OXYCODONE-ACETAMINOPHEN 5-325 MG PO TABS: 2.0000 | ORAL_TABLET | ORAL | 0 refills | Status: DC | PRN

## 2023-04-10 MED ORDER — HYDROMORPHONE HCL 1 MG/ML IJ SOLN
2.0000 mg | Freq: Once | INTRAMUSCULAR | Status: AC
  Administered 2023-04-10: 2 mg via INTRAMUSCULAR
  Filled 2023-04-10: qty 2

## 2023-04-10 MED ORDER — PREDNISONE 10 MG PO TABS: 20.0000 mg | ORAL_TABLET | Freq: Two times a day (BID) | ORAL | 0 refills | Status: DC

## 2023-04-10 NOTE — ED Notes (Signed)
Portable xray at bedside.

## 2023-04-10 NOTE — ED Provider Notes (Signed)
Kristine Montgomery Provider Note   CSN: [id number] Arrival date & time: 04/10/23  0256     History  Chief Complaint  Patient presents with   Knee Pain    Kristine Montgomery is a 57 y.o. female.  Patient is a 57 year old female with past medical history of morbid obesity, diabetes, osteoarthritis of both knees, CHF, hyperlipidemia, paroxysmal A-fib on Eliquis, rheumatoid.  Patient presenting today for evaluation of left knee pain.  She has a history of chronic pain in both legs, but the left knee began worsening 1 week ago.  Her pain is worsened to the point where she is having difficulty walking.  No new injury or trauma.  No fevers or chills.  Patient has hydrocodone she takes at home, however this has not helped.  The history is provided by the patient.       Home Medications Prior to Admission medications   Medication Sig Start Date End Date Taking? Authorizing Provider  apixaban (ELIQUIS) 5 MG TABS tablet Take 1 tablet (5 mg total) by mouth 2 (two) times daily. TAKE (1) TABLET BY MOUTH TWICE DAILY. 01/18/23   Pricilla Riffle, MD  aspirin EC 81 MG tablet Take 81 mg by mouth daily. Swallow whole.    [provider]  atorvastatin (LIPITOR) 80 MG tablet TAKE (1) TABLET BY MOUTH ONCE DAILY AT 6 PM Patient taking differently: Take 80 mg by mouth daily. 08/20/22   Pricilla Riffle, MD  dapagliflozin propanediol (FARXIGA) 10 MG TABS tablet TAKE 1 TABLET BY MOUTH ONCE A DAY. 04/22/22   Pricilla Riffle, MD  dronedarone (MULTAQ) 400 MG tablet Take 400 mg by mouth 2 (two) times daily with a meal.    [provider]  famotidine (PEPCID) 40 MG tablet TAKE ONE TABLET BY MOUTH IN THE EVENING. 11/24/22   Pricilla Riffle, MD  gabapentin (NEURONTIN) 100 MG capsule Take 1 capsule (100 mg total) by mouth 2 (two) times daily. 01/27/23   Antony Madura, MD  metoprolol succinate (TOPROL-XL) 25 MG 24 hr tablet TAKE 1/2 TABLET BY MOUTH ONCE DAILY. 06/07/22   Pricilla Riffle, MD  nitroGLYCERIN (NITROSTAT) 0.4 MG SL tablet PLACE 1 TABLET (0.4 MG TOTAL) UNDER THE TONGUE EVERY FIVE MINUTES X 3 DOSES AS NEEDED FOR CHEST PAIN. Patient taking differently: Place 0.4 mg under the tongue every 5 (five) minutes as needed for chest pain. 07/31/20 03/29/23  Manson Passey, PA  oxyCODONE-acetaminophen (PERCOCET/ROXICET) 5-325 MG tablet Take 1 tablet by mouth every 6 (six) hours as needed for severe pain (pain score 7-10). 03/15/23   Bethann Berkshire, MD  sacubitril-valsartan (ENTRESTO) 24-26 MG Take 1 tablet by mouth 2 (two) times daily. 01/18/23   Pricilla Riffle, MD  torsemide (DEMADEX) 20 MG tablet Take 1 tablet (20 mg total) by mouth daily. 03/29/23   Marinus Maw, MD      Allergies    Patient has no known allergies.    Review of Systems   Review of Systems  All other systems reviewed and are negative.   Physical Exam Updated Vital Signs BP (!) 146/113   Pulse 80   Temp 98 F (36.7 C) (Oral)   Resp 20   Ht 5\' 1"  (1.549 m)   Wt 126 kg   LMP 06/27/2017 (Within Weeks)   SpO2 97%   BMI 52.49 kg/m  Physical Exam Vitals and nursing note reviewed.  Constitutional:      Appearance: Normal  appearance.  Pulmonary:     Effort: Pulmonary effort is normal.  Musculoskeletal:     Comments: The left knee appears grossly normal.  There is no warmth or redness or effusion.  She has pain with range of motion.  DP pulses are palpable distally.  Skin:    General: Skin is warm and dry.  Neurological:     Mental Status: She is alert and oriented to person, place, and time.     ED Results / Procedures / Treatments   Labs (all labs ordered are listed, but only abnormal results are displayed) Labs Reviewed - No data to display  EKG None  Radiology No results found.  Procedures Procedures    Medications Ordered in ED Medications  HYDROmorphone (DILAUDID) injection 2 mg (has no administration in time range)  predniSONE (DELTASONE) tablet 40 mg (has no  administration in time range)    ED Course/ Medical Decision Making/ A&P  Patient is a 57 year old female presenting with left knee pain as described in the HPI.  She has a longstanding history of osteoarthritis with severe pain in the knee.  Patient arrives with stable vital signs and is afebrile.  The knee appears symmetrical with the right.  Exam is limited secondary to pain.  X-rays show severe arthritis, but no acute findings.  Patient will be treated with prednisone, Percocet, and follow-up with her orthopedist.  Final Clinical Impression(s) / ED Diagnoses Final diagnoses:  None    Rx / DC Orders ED Discharge Orders     None         Geoffery Lyons, MD 04/10/23 0505

## 2023-04-10 NOTE — ED Triage Notes (Signed)
Pt arrived via [facility] from home c/o bilateral knee pain, left knee worse than the right. Pt reports pain has been persistent for more than a year. Pt seen here last month for same.

## 2023-04-10 NOTE — Discharge Instructions (Signed)
Begin taking prednisone as prescribed.  Begin taking Percocet as prescribed as needed for pain.    Follow-up with Dr. Romeo Apple as scheduled, sooner if symptoms are not improving.

## 2023-04-11 ENCOUNTER — Telehealth: Payer: Self-pay | Admitting: Neurology

## 2023-04-11 ENCOUNTER — Ambulatory Visit (INDEPENDENT_AMBULATORY_CARE_PROVIDER_SITE_OTHER): Payer: Medicare HMO | Admitting: Neurology

## 2023-04-11 DIAGNOSIS — M79671 Pain in right foot: Secondary | ICD-10-CM | POA: Diagnosis not present

## 2023-04-11 DIAGNOSIS — R209 Unspecified disturbances of skin sensation: Secondary | ICD-10-CM

## 2023-04-11 DIAGNOSIS — M79672 Pain in left foot: Secondary | ICD-10-CM

## 2023-04-11 DIAGNOSIS — M1990 Unspecified osteoarthritis, unspecified site: Secondary | ICD-10-CM

## 2023-04-11 DIAGNOSIS — R208 Other disturbances of skin sensation: Secondary | ICD-10-CM

## 2023-04-11 NOTE — Telephone Encounter (Signed)
Discussed the results of patient's EMG after the procedure today with patient and her daughter. 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  Her labs were significant for B1 and B12 deficiency, which is contributing. Patient has not yet picked up these supplements, but plans to today. She will follow up with me in 3-6 months or sooner if needed.  Kristine Balint, MD [facility]

## 2023-04-11 NOTE — Procedures (Signed)
[facility]  [address], [address]  [place], [place] [address] Tel: [phone] Fax: [phone] Test Date:  04/11/2023  Patient: Kristine Montgomery DOB: July 15, 1965 Physician: Jacquelyne Balint, MD  Sex: Female Height: 5\' 1"  Ref Phys: Jacquelyne Balint, MD  ID#: [id number]   Technician:    History: This is a 57 year old female with bilateral foot pain.  NCV & EMG Findings: Extensive electrodiagnostic evaluation of bilateral lower limbs shows: Bilateral sural and superficial peroneal/fibular sensory responses are absent. Bilateral peroneal/fibular (EDB) motor responses are absent. Left tibial (AH) and left peroneal/fibular (TA) motor responses show reduced amplitude (AH 1.90 mV, TA 2.2 mV). Right tibial (AH) and right peroneal/fibular (TA) motor responses are within normal limits. Bilateral H reflex latencies are within normal limits. There is no evidence of active or chronic motor axon loss changes affecting any of the tested muscles on needle examination. Motor unit configuration and recruitment pattern is within normal limits.  Impression: This is a technically challenging study. The findings are complicated by significant peripheral edema and patient's physiognomy. While there are absent sensory responses and asymmetry, the normal needle examination bilaterally suggests no significant abnormalities. 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    ___________________________ Jacquelyne Balint, MD    Nerve Conduction Studies Motor Nerve Results    Latency Amplitude F-Lat Segment Distance CV Comment  Site (ms) Norm (mV) Norm (ms)  (cm) (m/s) Norm   Left Fibular (EDB) Motor  Ankle *NR  < 6.0 *NR  > 2.5        Bel fib head *NR - *NR -  Bel fib head-Ankle - *NR  > 40   Pop fossa *NR - *NR -  Pop fossa-Bel fib head - *NR -   Right Fibular (EDB) Motor  Ankle *NR  < 6.0 *NR  > 2.5        Bel fib head *NR - *NR -  Bel fib head-Ankle  - *NR  > 40   Pop fossa *NR - *NR -  Pop fossa-Bel fib head - *NR -   Left Fibular (TA) Motor  Fib head 2.6  < 4.5 *2.2  > 3.0        Pop fossa 4.2  < 6.7 1.35 -  Pop fossa-Fib head 9 56  > 40   Right Fibular (TA) Motor  Fib head 2.3  < 4.5 3.7  > 3.0        Pop fossa 4.0  < 6.7 3.2 -  Pop fossa-Fib head 8 47  > 40   Left Tibial (AH) Motor  Ankle 3.7  < 6.0 *1.90  > 4.0        Knee *NR - *NR -  Knee-Ankle - *NR  > 40   Right Tibial (AH) Motor  Ankle 4.3  < 6.0 5.6  > 4.0        Knee 11.8 - 4.0 -  Knee-Ankle 38 51  > 40    Sensory Sites    Neg Peak Lat Amplitude (O-P) Segment Distance Velocity Comment  Site (ms) Norm (V) Norm  (cm) (ms)   Left Superficial Fibular Sensory  14 cm-Ankle *NR  < 4.6 *NR  > 4 14 cm-Ankle 14    Right Superficial Fibular Sensory  14 cm-Ankle *NR  < 4.6 *NR  > 4 14 cm-Ankle 14    Left Sural Sensory  Calf-Lat mall *NR  < 4.6 *NR  > 4 Calf-Lat  mall 14    Right Sural Sensory  Calf-Lat mall *NR  < 4.6 *NR  > 4 Calf-Lat mall 14     H-Reflex Results    M-Lat H Lat H Neg Amp H-M Lat  Site (ms) (ms) Norm (mV) (ms)  Left Tibial H-Reflex  Pop fossa 4.2 26.5  < 35.0 0.56 22.3  Right Tibial H-Reflex  Pop fossa 3.5 29.0  < 35.0 1.15 25.5   Electromyography   Side Muscle Ins.Act Fibs Fasc Recrt Amp Dur Poly Activation Comment  Left Tib ant Nml Nml Nml Nml Nml Nml Nml Nml N/A  Left Gastroc MH Nml Nml Nml Nml Nml Nml Nml Nml N/A  Left Vastus lat Nml Nml Nml Nml Nml Nml Nml Nml N/A  Left Biceps fem SH Nml Nml Nml Nml Nml Nml Nml Nml N/A  Left Gluteus med Nml Nml Nml Nml Nml Nml Nml Nml N/A  Right Tib ant Nml Nml Nml Nml Nml Nml Nml Nml N/A  Right Gastroc MH Nml Nml Nml Nml Nml Nml Nml Nml N/A  Right Rectus fem Nml Nml Nml Nml Nml Nml Nml Nml N/A  Right Biceps fem SH Nml Nml Nml Nml Nml Nml Nml Nml N/A  Right Gluteus med Nml Nml Nml Nml Nml Nml Nml Nml N/A      Waveforms:  Motor               Sensory           H-Reflex

## 2023-04-12 DIAGNOSIS — Z6841 Body Mass Index (BMI) 40.0 and over, adult: Secondary | ICD-10-CM | POA: Diagnosis not present

## 2023-04-12 DIAGNOSIS — M19079 Primary osteoarthritis, unspecified ankle and foot: Secondary | ICD-10-CM | POA: Diagnosis not present

## 2023-04-12 DIAGNOSIS — I480 Paroxysmal atrial fibrillation: Secondary | ICD-10-CM | POA: Diagnosis not present

## 2023-04-12 DIAGNOSIS — G894 Chronic pain syndrome: Secondary | ICD-10-CM | POA: Diagnosis not present

## 2023-04-12 DIAGNOSIS — M2550 Pain in unspecified joint: Secondary | ICD-10-CM | POA: Diagnosis not present

## 2023-04-12 DIAGNOSIS — I110 Hypertensive heart disease with heart failure: Secondary | ICD-10-CM | POA: Diagnosis not present

## 2023-04-12 DIAGNOSIS — M159 Polyosteoarthritis, unspecified: Secondary | ICD-10-CM | POA: Diagnosis not present

## 2023-04-12 DIAGNOSIS — I255 Ischemic cardiomyopathy: Secondary | ICD-10-CM | POA: Diagnosis not present

## 2023-04-16 DIAGNOSIS — D6869 Other thrombophilia: Secondary | ICD-10-CM | POA: Diagnosis not present

## 2023-04-16 DIAGNOSIS — I480 Paroxysmal atrial fibrillation: Secondary | ICD-10-CM | POA: Diagnosis not present

## 2023-04-16 DIAGNOSIS — M19079 Primary osteoarthritis, unspecified ankle and foot: Secondary | ICD-10-CM | POA: Diagnosis not present

## 2023-04-16 DIAGNOSIS — I110 Hypertensive heart disease with heart failure: Secondary | ICD-10-CM | POA: Diagnosis not present

## 2023-04-22 ENCOUNTER — Ambulatory Visit: Payer: Medicare HMO | Admitting: Orthopedic Surgery

## 2023-04-27 ENCOUNTER — Other Ambulatory Visit: Payer: Self-pay

## 2023-04-27 ENCOUNTER — Emergency Department (HOSPITAL_COMMUNITY)
Admission: EM | Admit: 2023-04-27 | Discharge: 2023-04-27 | Disposition: A | Discharge status: Home / Self Care | Payer: Medicare HMO | Attending: Emergency Medicine | Admitting: Emergency Medicine

## 2023-04-27 ENCOUNTER — Encounter (HOSPITAL_COMMUNITY): Payer: Self-pay | Admitting: Emergency Medicine

## 2023-04-27 DIAGNOSIS — I2510 Atherosclerotic heart disease of native coronary artery without angina pectoris: Secondary | ICD-10-CM | POA: Insufficient documentation

## 2023-04-27 DIAGNOSIS — R609 Edema, unspecified: Secondary | ICD-10-CM | POA: Diagnosis not present

## 2023-04-27 DIAGNOSIS — Z7901 Long term (current) use of anticoagulants: Secondary | ICD-10-CM | POA: Diagnosis not present

## 2023-04-27 DIAGNOSIS — R509 Fever, unspecified: Secondary | ICD-10-CM | POA: Diagnosis not present

## 2023-04-27 DIAGNOSIS — Z7982 Long term (current) use of aspirin: Secondary | ICD-10-CM | POA: Diagnosis not present

## 2023-04-27 DIAGNOSIS — L03115 Cellulitis of right lower limb: Secondary | ICD-10-CM | POA: Insufficient documentation

## 2023-04-27 DIAGNOSIS — L03116 Cellulitis of left lower limb: Secondary | ICD-10-CM | POA: Insufficient documentation

## 2023-04-27 DIAGNOSIS — I4891 Unspecified atrial fibrillation: Secondary | ICD-10-CM | POA: Diagnosis not present

## 2023-04-27 DIAGNOSIS — L03119 Cellulitis of unspecified part of limb: Secondary | ICD-10-CM

## 2023-04-27 DIAGNOSIS — I509 Heart failure, unspecified: Secondary | ICD-10-CM | POA: Diagnosis not present

## 2023-04-27 LAB — CBC
HCT: 31.9 % — ABNORMAL LOW (ref 36.0–46.0)
Hemoglobin: 9.4 g/dL — ABNORMAL LOW (ref 12.0–15.0)
MCH: 25.3 pg — ABNORMAL LOW (ref 26.0–34.0)
MCHC: 29.5 g/dL — ABNORMAL LOW (ref 30.0–36.0)
MCV: 86 fL (ref 80.0–100.0)
Platelets: 260 10*3/uL (ref 150–400)
RBC: 3.71 MIL/uL — ABNORMAL LOW (ref 3.87–5.11)
RDW: 16.9 % — ABNORMAL HIGH (ref 11.5–15.5)
WBC: 8.2 10*3/uL (ref 4.0–10.5)
nRBC: 0 % (ref 0.0–0.2)

## 2023-04-27 LAB — BASIC METABOLIC PANEL
Anion gap: 8 (ref 5–15)
BUN: 18 mg/dL (ref 6–20)
CO2: 25 mmol/L (ref 22–32)
Calcium: 8.9 mg/dL (ref 8.9–10.3)
Chloride: 105 mmol/L (ref 98–111)
Creatinine, Ser: 0.73 mg/dL (ref 0.44–1.00)
GFR, Estimated: >60 mL/min (ref >60)
Glucose, Bld: 93 mg/dL (ref 70–99)
Potassium: 3.8 mmol/L (ref 3.5–5.1)
Sodium: 138 mmol/L (ref 135–145)

## 2023-04-27 MED ORDER — CEFADROXIL 500 MG PO CAPS: 500.0000 mg | ORAL_CAPSULE | Freq: Two times a day (BID) | ORAL | 0 refills | Status: DC

## 2023-04-27 MED ORDER — ONDANSETRON 4 MG PO TBDP: 4.0000 mg | ORAL_TABLET | Freq: Three times a day (TID) | ORAL | 0 refills | Status: DC | PRN

## 2023-04-27 MED ORDER — SODIUM CHLORIDE 0.9 % IV SOLN
2.0000 g | Freq: Once | INTRAVENOUS | Status: AC
  Administered 2023-04-27: 2 g via INTRAVENOUS
  Filled 2023-04-27: qty 20

## 2023-04-27 NOTE — ED Provider Notes (Signed)
[facility] Provider Note   CSN: [id number] Arrival date & time: 04/27/23  1044     History  Chief Complaint  Patient presents with   Leg Pain    Kristine Montgomery is a 57 y.o. female.  HPI   57 year old female presents emergency department with complaints of lower extremity redness, pain.  Patient reports waking up this morning and noticed redness of her lower extremities.  States areas are tender to the touch.  Denies any known cuts, scrapes on her lower extremities.  Reports baseline swelling due to history of CHF.  States that swelling is no different than her normal but did not have a chance to elevate her legs this morning.  Denies any associated chest pain, shortness of breath, fever, chills.  Has been on no recent antibiotics.  Past medical history significant for NSTEMI, CHF, obesity, ischemic cardiomyopathy, CAD, paroxysmal atrial fibrillation on Eliquis, gout  Home Medications Prior to Admission medications   Medication Sig Start Date End Date Taking? Authorizing Provider  cefadroxil (DURICEF) 500 MG capsule Take 1 capsule (500 mg total) by mouth 2 (two) times daily. 04/28/23  Yes Sherian Maroon A, PA  apixaban (ELIQUIS) 5 MG TABS tablet Take 1 tablet (5 mg total) by mouth 2 (two) times daily. TAKE (1) TABLET BY MOUTH TWICE DAILY. 01/18/23   Pricilla Riffle, MD  aspirin EC 81 MG tablet Take 81 mg by mouth daily. Swallow whole.    [provider]  atorvastatin (LIPITOR) 80 MG tablet TAKE (1) TABLET BY MOUTH ONCE DAILY AT 6 PM Patient taking differently: Take 80 mg by mouth daily. 08/20/22   Pricilla Riffle, MD  dapagliflozin propanediol (FARXIGA) 10 MG TABS tablet TAKE 1 TABLET BY MOUTH ONCE A DAY. 04/22/22   Pricilla Riffle, MD  dronedarone (MULTAQ) 400 MG tablet Take 400 mg by mouth 2 (two) times daily with a meal.    [provider]  famotidine (PEPCID) 40 MG tablet TAKE ONE TABLET BY MOUTH IN THE EVENING. 11/24/22    Pricilla Riffle, MD  gabapentin (NEURONTIN) 100 MG capsule Take 1 capsule (100 mg total) by mouth 2 (two) times daily. 01/27/23   Antony Madura, MD  metoprolol succinate (TOPROL-XL) 25 MG 24 hr tablet TAKE 1/2 TABLET BY MOUTH ONCE DAILY. 06/07/22   Pricilla Riffle, MD  nitroGLYCERIN (NITROSTAT) 0.4 MG SL tablet PLACE 1 TABLET (0.4 MG TOTAL) UNDER THE TONGUE EVERY FIVE MINUTES X 3 DOSES AS NEEDED FOR CHEST PAIN. Patient taking differently: Place 0.4 mg under the tongue every 5 (five) minutes as needed for chest pain. 07/31/20 03/29/23  Manson Passey, PA  oxyCODONE-acetaminophen (PERCOCET) 5-325 MG tablet Take 2 tablets by mouth every 4 (four) hours as needed. 04/10/23   Geoffery Lyons, MD  predniSONE (DELTASONE) 10 MG tablet Take 2 tablets (20 mg total) by mouth 2 (two) times daily. 04/10/23   Geoffery Lyons, MD  sacubitril-valsartan (ENTRESTO) 24-26 MG Take 1 tablet by mouth 2 (two) times daily. 01/18/23   Pricilla Riffle, MD  torsemide (DEMADEX) 20 MG tablet Take 1 tablet (20 mg total) by mouth daily. 03/29/23   Marinus Maw, MD      Allergies    Patient has no known allergies.    Review of Systems   Review of Systems  All other systems reviewed and are negative.   Physical Exam Updated Vital Signs BP 125/83   Pulse 63   Temp 98.2 F (  36.8 C) (Oral)   Resp 20   Ht 5\' 1"  (1.549 m)   Wt 125.6 kg   LMP 06/27/2017 (Within Weeks)   SpO2 97%   BMI 52.34 kg/m  Physical Exam Vitals and nursing note reviewed.  Constitutional:      General: She is not in acute distress.    Appearance: She is well-developed.  HENT:     Head: Normocephalic and atraumatic.  Eyes:     Conjunctiva/sclera: Conjunctivae normal.  Cardiovascular:     Rate and Rhythm: Normal rate and regular rhythm.     Heart sounds: No murmur heard. Pulmonary:     Effort: Pulmonary effort is normal. No respiratory distress.     Breath sounds: Normal breath sounds. No wheezing, rhonchi or rales.  Abdominal:     Palpations:  Abdomen is soft.     Tenderness: There is no abdominal tenderness.  Musculoskeletal:        General: No swelling.     Cervical back: Neck supple.     Right lower leg: Edema present.     Left lower leg: Edema present.     Comments: Patient with erythema appreciated on anterior aspect of lower legs.  See media below.  Overlying areas warm to palpation as well as tender.  Pedal pulses 2+ bilaterally.  Full range of motion of lower extremities at hips, knees, ankles, digits.  No palpable crepitus.  Skin:    General: Skin is warm and dry.     Capillary Refill: Capillary refill takes less than 2 seconds.  Neurological:     Mental Status: She is alert.  Psychiatric:        Mood and Affect: Mood normal.     ED Results / Procedures / Treatments   Labs (all labs ordered are listed, but only abnormal results are displayed) Labs Reviewed  CBC - Abnormal; Notable for the following components:      Result Value   RBC 3.71 (*)    Hemoglobin 9.4 (*)    HCT 31.9 (*)    MCH 25.3 (*)    MCHC 29.5 (*)    RDW 16.9 (*)    All other components within normal limits  BASIC METABOLIC PANEL    EKG None  Radiology No results found.  Procedures Procedures    Medications Ordered in ED Medications  cefTRIAXone (ROCEPHIN) 2 g in sodium chloride 0.9 % 100 mL IVPB (2 g Intravenous New Bag/Given 04/27/23 1159)    ED Course/ Medical Decision Making/ A&P                                 Medical Decision Making Amount and/or Complexity of Data Reviewed Labs: ordered.  Risk Prescription drug management.   This patient presents to the ED for concern of lower extremity redness, pain, this involves an extensive number of treatment options, and is a complaint that carries with it a high risk of complications and morbidity.  The differential diagnosis includes cellulitis, DVT, fracture, dislocation, septic arthritis, necrotizing infection, other   Co morbidities that complicate the patient  evaluation  See HPI   Additional history obtained:  Additional history obtained from EMR External records from outside source obtained and reviewed including hospital records   Lab Tests:  I Ordered, and personally interpreted labs.  The pertinent results include: No leukocytosis.  Evidence of anemia with hemoglobin 9.4.  Platelets within range.  No Electra abnormalities.  No renal dysfunction.   Imaging Studies ordered:  na   Cardiac Monitoring: / EKG:  The patient was maintained on a cardiac monitor.  I personally viewed and interpreted the cardiac monitored which showed an underlying rhythm of: Sinus rhythm   Consultations Obtained:  N/a   Problem List / ED Course / Critical interventions / Medication management  Cellulitis I ordered medication including Rocephin   Reevaluation of the patient after these medicines showed that the patient improved I have reviewed the patients home medicines and have made adjustments as needed   Social Determinants of Health:  Denies tobacco, illicit drug use   Test / Admission - Considered:  Cellulitis Vitals signs within normal range and stable throughout visit. Laboratory/imaging studies significant for: See above 57 year old female presents emergency department with complaints of lower extremity redness, tenderness that began this morning.  On exam, patient with obvious erythema anterior lower extremities with palpable warmth but without palpable crepitus.  No pulse deficits to suggest ischemic limb.  Patient with lower extremity edema but states this is baseline and no worsening from baseline.  Did not have the ability to elevate her legs above level of heart this morning due to ED visit.  Without accompanying shortness of breath/chest pain.  Suspect patient's symptoms are likely secondary to cellulitis.  Vitals within normal range and stable; no leukocytosis.  Given 1 dose of antibiotics while in the ED.  Will send in oral  antibiotics at home for treatment of cellulitis.  Strict return precautions discussed at length and will recommend follow-up with primary care for reassessment.  Treatment plan discussed length with patient and she acknowledged understanding was agreeable to said plan.  Patient overall well-appearing, afebrile in no acute distress. Worrisome signs and symptoms were discussed with the patient and the patient knowledge understanding to return to emergency department if notice.  Patient stable upon discharge.        Final Clinical Impression(s) / ED Diagnoses Final diagnoses:  Cellulitis of lower extremity, unspecified laterality    Rx / DC Orders ED Discharge Orders          Ordered    cefadroxil (DURICEF) 500 MG capsule  2 times daily        04/27/23 1211              Peter Garter, [place] 04/27/23 1225    Vanetta Mulders, MD 04/28/23 [phone]

## 2023-04-27 NOTE — ED Triage Notes (Signed)
Pt bib ems from home. Pt c/o bilateral leg pain and swelling, hot to touch and red. A/o. Nad.

## 2023-04-27 NOTE — ED Notes (Signed)
Pitting edema noted to ble with redness noted. No warmth noted Pedal pulses heard with doppler. Abd distended but soft. Denies shob.

## 2023-04-27 NOTE — ED Notes (Signed)
ED Provider at bedside. 

## 2023-04-27 NOTE — Discharge Instructions (Addendum)
As discussed, concern for cellulitis of lower extremities.  Will place you on antibiotics for treatment of skin infection.  If you develop worsening redness, pain, fever, please return to the emergency department.  Otherwise, oral antibiotics should treat current episode of cellulitis.

## 2023-05-06 DIAGNOSIS — I5032 Chronic diastolic (congestive) heart failure: Secondary | ICD-10-CM | POA: Diagnosis not present

## 2023-05-06 DIAGNOSIS — J042 Acute laryngotracheitis: Secondary | ICD-10-CM | POA: Diagnosis not present

## 2023-05-06 DIAGNOSIS — I480 Paroxysmal atrial fibrillation: Secondary | ICD-10-CM | POA: Diagnosis not present

## 2023-05-06 DIAGNOSIS — L03119 Cellulitis of unspecified part of limb: Secondary | ICD-10-CM | POA: Diagnosis not present

## 2023-05-09 ENCOUNTER — Other Ambulatory Visit: Payer: Self-pay

## 2023-05-09 MED ORDER — DAPAGLIFLOZIN PROPANEDIOL 10 MG PO TABS: 10.0000 mg | ORAL_TABLET | Freq: Every day | ORAL | 2 refills | Status: DC

## 2023-05-17 ENCOUNTER — Telehealth: Payer: Self-pay | Admitting: Internal Medicine

## 2023-05-17 MED ORDER — DRONEDARONE HCL 400 MG PO TABS: 400.0000 mg | ORAL_TABLET | Freq: Two times a day (BID) | ORAL | 0 refills | Status: DC

## 2023-05-17 NOTE — Telephone Encounter (Signed)
Spoke with [employer] patient connection who states that the pt will need to reapply for assistance. Pt notified and will stop by office on Thur. For application and samples.

## 2023-05-17 NOTE — Telephone Encounter (Signed)
*  STAT* If patient is at the pharmacy, call can be transferred to refill team.   1. Which medications need to be refilled? (please list name of each medication and dose if known) dronedarone  (MULTAQ ) 400 MG tablet   2. Which pharmacy/location (including street and city if local pharmacy) is medication to be sent to? States they get it at the [facility] office   3. Do they need a 30 day or 90 day supply? 90 day

## 2023-05-18 DIAGNOSIS — G894 Chronic pain syndrome: Secondary | ICD-10-CM | POA: Insufficient documentation

## 2023-05-20 ENCOUNTER — Ambulatory Visit (INDEPENDENT_AMBULATORY_CARE_PROVIDER_SITE_OTHER): Payer: Medicare HMO | Admitting: Orthopedic Surgery

## 2023-05-20 ENCOUNTER — Encounter: Payer: Self-pay | Admitting: Orthopedic Surgery

## 2023-05-20 DIAGNOSIS — M1712 Unilateral primary osteoarthritis, left knee: Secondary | ICD-10-CM | POA: Diagnosis not present

## 2023-05-20 DIAGNOSIS — Z6841 Body Mass Index (BMI) 40.0 and over, adult: Secondary | ICD-10-CM | POA: Diagnosis not present

## 2023-05-20 MED ORDER — METHYLPREDNISOLONE ACETATE 40 MG/ML IJ SUSP
40.0000 mg | Freq: Once | INTRAMUSCULAR | Status: AC
  Administered 2023-05-20: 40 mg via INTRA_ARTICULAR

## 2023-05-20 NOTE — Progress Notes (Signed)
 Chief Complaint  Patient presents with   Knee Pain    Left states woke up last week and can't weight bear on the left leg is scooting  on her walker    58 year old female with morbid obesity chronic peripheral edema woke up with left knee pain and inability to bear weight comes in with stiff knee pain and swelling  She is anticoagulated currently on Eliquis   She is not diabetic  She is on opioid medication  Her left knee has a flexion contracture of 30 degrees her flexion arc is from 30 to 115 degrees  She has tenderness and swelling with tenderness to palpation joint effusion  Assessment and plan  X-ray was done at [facility] I will render a reading   diffuse osteopenia grade 4 arthritis medial lateral compartments  Encounter Diagnoses  Name Primary?   Primary osteoarthritis of left knee Yes   Body mass index 50.0-59.9, adult (HCC)    Morbid obesity (HCC)     Procedure note left knee injection   verbal consent was obtained to inject left knee joint  Timeout was completed to confirm the site of injection  The medications used were depomedrol 40 mg and 1% lidocaine  3 cc Anesthesia was provided by ethyl chloride and the skin was prepped with alcohol.  After cleaning the skin with alcohol a 20-gauge needle was used to inject the left knee joint. There were no complications. A sterile bandage was applied.    Not much I can do in the situation  Recommend steroid injection and oral prednisone  which she will pick up from the pharmacy already ordered  No return planned

## 2023-05-26 DIAGNOSIS — H9193 Unspecified hearing loss, bilateral: Secondary | ICD-10-CM | POA: Insufficient documentation

## 2023-05-26 DIAGNOSIS — H903 Sensorineural hearing loss, bilateral: Secondary | ICD-10-CM | POA: Diagnosis not present

## 2023-05-26 DIAGNOSIS — H73893 Other specified disorders of tympanic membrane, bilateral: Secondary | ICD-10-CM | POA: Diagnosis not present

## 2023-06-02 ENCOUNTER — Other Ambulatory Visit (HOSPITAL_COMMUNITY)
Admission: RE | Admit: 2023-06-02 | Discharge: 2023-06-02 | Disposition: A | Discharge status: Home / Self Care | Payer: Medicare HMO | Source: Ambulatory Visit | Attending: Internal Medicine | Admitting: Internal Medicine

## 2023-06-02 DIAGNOSIS — L821 Other seborrheic keratosis: Secondary | ICD-10-CM | POA: Diagnosis not present

## 2023-06-02 DIAGNOSIS — I480 Paroxysmal atrial fibrillation: Secondary | ICD-10-CM | POA: Diagnosis not present

## 2023-06-02 DIAGNOSIS — M159 Polyosteoarthritis, unspecified: Secondary | ICD-10-CM | POA: Diagnosis not present

## 2023-06-02 DIAGNOSIS — I5032 Chronic diastolic (congestive) heart failure: Secondary | ICD-10-CM | POA: Diagnosis not present

## 2023-06-02 DIAGNOSIS — R350 Frequency of micturition: Secondary | ICD-10-CM | POA: Diagnosis not present

## 2023-06-02 DIAGNOSIS — Z029 Encounter for administrative examinations, unspecified: Secondary | ICD-10-CM | POA: Diagnosis present

## 2023-06-02 DIAGNOSIS — Z79899 Other long term (current) drug therapy: Secondary | ICD-10-CM | POA: Insufficient documentation

## 2023-06-02 DIAGNOSIS — L03119 Cellulitis of unspecified part of limb: Secondary | ICD-10-CM | POA: Diagnosis not present

## 2023-06-02 DIAGNOSIS — Z0001 Encounter for general adult medical examination with abnormal findings: Secondary | ICD-10-CM | POA: Diagnosis not present

## 2023-06-02 DIAGNOSIS — D6869 Other thrombophilia: Secondary | ICD-10-CM | POA: Diagnosis not present

## 2023-06-02 DIAGNOSIS — J042 Acute laryngotracheitis: Secondary | ICD-10-CM | POA: Diagnosis not present

## 2023-06-02 LAB — COMPREHENSIVE METABOLIC PANEL
ALT: 10 U/L (ref 0–44)
AST: 12 U/L — ABNORMAL LOW (ref 15–41)
Albumin: 2.9 g/dL — ABNORMAL LOW (ref 3.5–5.0)
Alkaline Phosphatase: 84 U/L (ref 38–126)
Anion gap: 8 (ref 5–15)
BUN: 14 mg/dL (ref 6–20)
CO2: 24 mmol/L (ref 22–32)
Calcium: 8.5 mg/dL — ABNORMAL LOW (ref 8.9–10.3)
Chloride: 106 mmol/L (ref 98–111)
Creatinine, Ser: 0.78 mg/dL (ref 0.44–1.00)
GFR, Estimated: >60 mL/min (ref >60)
Glucose, Bld: 107 mg/dL — ABNORMAL HIGH (ref 70–99)
Potassium: 3.6 mmol/L (ref 3.5–5.1)
Sodium: 138 mmol/L (ref 135–145)
Total Bilirubin: 0.7 mg/dL (ref 0.0–1.2)
Total Protein: 7.1 g/dL (ref 6.5–8.1)

## 2023-06-02 LAB — CBC WITH DIFFERENTIAL/PLATELET
Abs Immature Granulocytes: 0.03 10*3/uL (ref 0.00–0.07)
Basophils Absolute: 0.1 10*3/uL (ref 0.0–0.1)
Basophils Relative: 1 %
Eosinophils Absolute: 0.2 10*3/uL (ref 0.0–0.5)
Eosinophils Relative: 2 %
HCT: 34.5 % — ABNORMAL LOW (ref 36.0–46.0)
Hemoglobin: 10.6 g/dL — ABNORMAL LOW (ref 12.0–15.0)
Immature Granulocytes: 0 %
Lymphocytes Relative: 19 %
Lymphs Abs: 1.8 10*3/uL (ref 0.7–4.0)
MCH: 25.9 pg — ABNORMAL LOW (ref 26.0–34.0)
MCHC: 30.7 g/dL (ref 30.0–36.0)
MCV: 84.4 fL (ref 80.0–100.0)
Monocytes Absolute: 0.8 10*3/uL (ref 0.1–1.0)
Monocytes Relative: 8 %
Neutro Abs: 6.6 10*3/uL (ref 1.7–7.7)
Neutrophils Relative %: 70 %
Platelets: 311 10*3/uL (ref 150–400)
RBC: 4.09 MIL/uL (ref 3.87–5.11)
RDW: 17 % — ABNORMAL HIGH (ref 11.5–15.5)
WBC: 9.5 10*3/uL (ref 4.0–10.5)
nRBC: 0 % (ref 0.0–0.2)

## 2023-06-02 LAB — BASIC METABOLIC PANEL
Anion gap: 8 (ref 5–15)
BUN: 13 mg/dL (ref 6–20)
CO2: 24 mmol/L (ref 22–32)
Calcium: 8.5 mg/dL — ABNORMAL LOW (ref 8.9–10.3)
Chloride: 106 mmol/L (ref 98–111)
Creatinine, Ser: 0.79 mg/dL (ref 0.44–1.00)
GFR, Estimated: >60 mL/min (ref >60)
Glucose, Bld: 107 mg/dL — ABNORMAL HIGH (ref 70–99)
Potassium: 3.6 mmol/L (ref 3.5–5.1)
Sodium: 138 mmol/L (ref 135–145)

## 2023-06-02 LAB — TSH: TSH: 1.683 u[IU]/mL (ref 0.350–4.500)

## 2023-06-02 LAB — LIPID PANEL
Cholesterol: 112 mg/dL (ref 0–200)
HDL: 41 mg/dL (ref >40)
LDL Cholesterol: 53 mg/dL (ref 0–99)
Total CHOL/HDL Ratio: 2.7 {ratio}
Triglycerides: 88 mg/dL (ref <150)
VLDL: 18 mg/dL (ref 0–40)

## 2023-06-02 LAB — VITAMIN B12: Vitamin B-12: 880 pg/mL (ref 180–914)

## 2023-06-02 LAB — VITAMIN D 25 HYDROXY (VIT D DEFICIENCY, FRACTURES): Vit D, 25-Hydroxy: 13 ng/mL — ABNORMAL LOW (ref 30–100)

## 2023-06-14 ENCOUNTER — Telehealth: Payer: Self-pay | Admitting: Internal Medicine

## 2023-06-14 ENCOUNTER — Telehealth: Payer: Self-pay | Admitting: Pharmacy Technician

## 2023-06-14 MED ORDER — DRONEDARONE HCL 400 MG PO TABS: 400.0000 mg | ORAL_TABLET | Freq: Two times a day (BID) | ORAL | 0 refills | Status: AC

## 2023-06-14 NOTE — Telephone Encounter (Signed)
Spoke with [place] with [employer] ( [employer]). She states application has be received and she will have it processed this week.  Pt notified and requested samples placed at front desk.

## 2023-06-14 NOTE — Telephone Encounter (Signed)
Pt is calling back checking on her pt assistance. It was dropped off on 06/10/23. Pt would like a c/b to know if it has been approved.

## 2023-06-14 NOTE — Addendum Note (Signed)
Addended by: Kerney Elbe on: 06/14/2023 10:26 AM   Modules accepted: Orders

## 2023-06-14 NOTE — Telephone Encounter (Signed)
Hello, we do not have any forms for patient assistance. I'm guessing its referencing the encounter from 05/17/23?

## 2023-06-16 ENCOUNTER — Emergency Department (HOSPITAL_COMMUNITY)
Admission: EM | Admit: 2023-06-16 | Discharge: 2023-06-16 | Disposition: A | Discharge status: Home / Self Care | Payer: Medicare HMO | Attending: Emergency Medicine | Admitting: Emergency Medicine

## 2023-06-16 ENCOUNTER — Encounter (HOSPITAL_COMMUNITY): Payer: Self-pay | Admitting: *Deleted

## 2023-06-16 ENCOUNTER — Other Ambulatory Visit: Payer: Self-pay

## 2023-06-16 ENCOUNTER — Emergency Department (HOSPITAL_COMMUNITY): Payer: Medicare HMO

## 2023-06-16 DIAGNOSIS — J45909 Unspecified asthma, uncomplicated: Secondary | ICD-10-CM | POA: Insufficient documentation

## 2023-06-16 DIAGNOSIS — Z87891 Personal history of nicotine dependence: Secondary | ICD-10-CM | POA: Insufficient documentation

## 2023-06-16 DIAGNOSIS — J449 Chronic obstructive pulmonary disease, unspecified: Secondary | ICD-10-CM | POA: Insufficient documentation

## 2023-06-16 DIAGNOSIS — I2510 Atherosclerotic heart disease of native coronary artery without angina pectoris: Secondary | ICD-10-CM | POA: Diagnosis not present

## 2023-06-16 DIAGNOSIS — M79601 Pain in right arm: Secondary | ICD-10-CM | POA: Insufficient documentation

## 2023-06-16 DIAGNOSIS — M25511 Pain in right shoulder: Secondary | ICD-10-CM | POA: Diagnosis not present

## 2023-06-16 DIAGNOSIS — M79605 Pain in left leg: Secondary | ICD-10-CM | POA: Diagnosis not present

## 2023-06-16 MED ORDER — LIDOCAINE 5 % EX PTCH: 1.0000 | MEDICATED_PATCH | CUTANEOUS | 0 refills | Status: DC

## 2023-06-16 MED ORDER — DEXAMETHASONE SODIUM PHOSPHATE 10 MG/ML IJ SOLN
10.0000 mg | Freq: Once | INTRAMUSCULAR | Status: AC
  Administered 2023-06-16: 10 mg via INTRAMUSCULAR
  Filled 2023-06-16: qty 1

## 2023-06-16 MED ORDER — HYDROMORPHONE HCL 1 MG/ML IJ SOLN
1.0000 mg | Freq: Once | INTRAMUSCULAR | Status: AC
  Administered 2023-06-16: 1 mg via INTRAMUSCULAR
  Filled 2023-06-16: qty 1

## 2023-06-16 NOTE — Discharge Instructions (Addendum)
You were evaluated in the Emergency Department and after careful evaluation, we did not find any emergent condition requiring admission or further testing in the hospital.  Your exam/testing today was overall reassuring.  X-rays did not show any abnormalities.  Suspect your pain is related to underlying arthritis.  Continue your home pain medicine, can use the numbing patches as needed.  Follow-up with your regular doctors and/or orthopedic specialist.  Please return to the Emergency Department if you experience any worsening of your condition.  Thank you for allowing [place] to be a part of your care.

## 2023-06-16 NOTE — ED Provider Notes (Signed)
[facility]-EMERGENCY DEPT [facility] Emergency Department Provider Note MRN:  [id number]  Arrival date & time: 06/16/23     Chief Complaint   Arm Pain and Leg Pain   History of Present Illness   Kristine Montgomery is a 58 y.o. year-old female with a history of CAD, COPD, rheumatoid arthritis presenting to the ED with chief complaint of arm pain and knee pain.  Chronic pain to the left knee that is a bit worse than normal recently.  Hurts to move.  New pain to the right humerus and right shoulder for the past 2 weeks.  Hurts to move it, hurts to touch it.  Had a steroid shot last week.  Still hurting.  Denies fever, no chest pain or shortness of breath.  Review of Systems  A thorough review of systems was obtained and all systems are negative except as noted in the HPI and PMH.   Patient's Health History    Past Medical History:  Diagnosis Date   Acute combined systolic and diastolic CHF, NYHA class 4 (HCC) 07/29/2020   a. EF 30% by echo in 07/2020 b. EF 55-60% by echo in 11/2020   Arthritis    Asthma    CAD (coronary artery disease)    a. 07/2020: s/p NSTEMI with DES to proxLAD and DES to proxLCx with medical management recommended of the occluded RCA.   Chronic bronchitis (HCC)    COPD (chronic obstructive pulmonary disease) (HCC)    Edema extremities    History of hiatal hernia    Hyperlipidemia LDL goal <70 07/29/2020   Ovarian cyst    Pneumonia    Rheumatoid arthritis (HCC)     Past Surgical History:  Procedure Laterality Date   CESAREAN SECTION     CORONARY STENT INTERVENTION N/A 07/29/2020   Procedure: CORONARY STENT INTERVENTION;  Surgeon: Lennette Bihari, MD;  Location: [facility] INVASIVE CV LAB;  Service: Cardiovascular;  Laterality: N/A;   KNEE ARTHROSCOPY WITH MEDIAL MENISECTOMY Right 05/23/2020   Procedure: KNEE ARTHROSCOPY WITH MEDIAL AND LATERAL MENISECTOMY; 3 COMPARTMENT SYNOVECTOMY;  Surgeon: Vickki Hearing, MD;  Location: [facility] ORS;  Service: Orthopedics;  Laterality:  Right;   RIGHT/LEFT HEART CATH AND CORONARY ANGIOGRAPHY N/A 07/29/2020   Procedure: RIGHT/LEFT HEART CATH AND CORONARY ANGIOGRAPHY;  Surgeon: Lennette Bihari, MD;  Location: [facility] INVASIVE CV LAB;  Service: Cardiovascular;  Laterality: N/A;   TONSILLECTOMY     TUBAL LIGATION      Family History  Problem Relation Age of Onset   Cancer Paternal Grandfather        colon   Cancer Maternal Grandmother        lung   COPD Father    Non-Hodgkin's lymphoma Mother    Diabetes Mother    Cancer Mother        cervical   Congestive Heart Failure Mother    Hypertension Sister    Other Son        fluid around heart    Social History   Socioeconomic History   Marital status: Single    Spouse name: Not on file   Number of children: 2   Years of education: 12   Highest education level: Not on file  Occupational History   Not on file  Tobacco Use   Smoking status: Former    Current packs/day: 0.00    Average packs/day: 0.5 packs/day for 30.0 years (15.0 ttl pk-yrs)    Types: Cigarettes    Start date: 07/1990  Quit date: 07/2020    Years since quitting: 2.9    Passive exposure: Past   Smokeless tobacco: Never  Vaping Use   Vaping status: Never Used  Substance and Sexual Activity   Alcohol use: Not Currently   Drug use: No   Sexual activity: Yes    Birth control/protection: Post-menopausal, Surgical    Comment: tubal  Other Topics Concern   Not on file  Social History Narrative   Are you right handed or left handed? Right   Are you currently employed ?    What is your current occupation? disabled   Do you live at home alone?yes   Who lives with you?    What type of home do you live in: 1 story or 2 story? one   Caffeine non    Social Drivers of Corporate investment banker Strain: Low Risk  (07/30/2020)   Overall Financial Resource Strain (CARDIA)    Difficulty of Paying Living Expenses: Not very hard  Food Insecurity: No Food Insecurity (04/09/2020)   Hunger Vital Sign     Worried About Running Out of Food in the Last Year: Never true    Ran Out of Food in the Last Year: Never true  Transportation Needs: No Transportation Needs (04/09/2020)   PRAPARE - Administrator, Civil Service (Medical): No    Lack of Transportation (Non-Medical): No  Physical Activity: Insufficiently Active (04/09/2020)   Exercise Vital Sign    Days of Exercise per Week: 1 day    Minutes of Exercise per Session: 10 min  Stress: No Stress Concern Present (04/09/2020)   [employer] of Occupational Health - Occupational Stress Questionnaire    Feeling of Stress : Not at all  Social Connections: Socially Isolated (07/30/2020)   Social Connection and Isolation Panel [NHANES]    Frequency of Communication with Friends and Family: More than three times a week    Frequency of Social Gatherings with Friends and Family: More than three times a week    Attends Religious Services: Never    Database administrator or Organizations: No    Attends Banker Meetings: Never    Marital Status: Widowed  Intimate Partner Violence: Not At Risk (04/09/2020)   Humiliation, Afraid, Rape, and Kick questionnaire    Fear of Current or Ex-Partner: No    Emotionally Abused: No    Physically Abused: No    Sexually Abused: No     Physical Exam   Vitals:   06/16/23 0550  BP: (!) 142/122  Pulse: 78  Resp: 20  Temp: 98.3 F (36.8 C)  SpO2: 97%    CONSTITUTIONAL: Chronically ill-appearing, NAD NEURO/PSYCH:  Alert and oriented x 3, no focal deficits EYES:  eyes equal and reactive ENT/NECK:  no LAD, no JVD CARDIO: Regular rate, well-perfused, normal S1 and S2 PULM:  CTAB no wheezing or rhonchi GI/GU:  non-distended, non-tender MSK/SPINE:  No gross deformities, no edema SKIN:  no rash, atraumatic   *Additional and/or pertinent findings included in MDM below  Diagnostic and Interventional Summary    EKG Interpretation Date/Time:    Ventricular Rate:    PR Interval:     QRS Duration:    QT Interval:    QTC Calculation:   R Axis:      Text Interpretation:         Labs Reviewed - No data to display  DG Shoulder Right  Final Result    DG Humerus Right  Final Result      Medications  dexamethasone (DECADRON) injection 10 mg (has no administration in time range)  HYDROmorphone (DILAUDID) injection 1 mg (1 mg Intramuscular Given 06/16/23 4098)     Procedures  /  Critical Care Procedures  ED Course and Medical Decision Making  Initial Impression and Ddx Patient is uncomfortable appearing.  She has pain with minimal palpation to the right arm.  Pain with movement however range of motion seems generally intact.  No increased warmth or erythema, no fever.  Highly doubt septic joint.  Suspect RA flare, also considering pathologic fracture.  No recent trauma.  No chest pain and the exam is very consistent with MSK related pain and so highly doubt referred cardiac pain.  There is no swelling or erythema to the knee.  No signs of infection, all limbs are neurovascularly intact, nothing to suggest vascular emergency.  DVT felt to be very unlikely given the patient is already anticoagulated.  Past medical/surgical history that increases complexity of ED encounter: RA  Interpretation of Diagnostics I personally reviewed the humerus x-ray and my interpretation is as follows: No fracture    Patient Reassessment and Ultimate Disposition/Management     On reassessment patient is more comfortable and range of motion is improved.  No indication for further testing or admission, appropriate for discharge.  Patient management required discussion with the following services or consulting groups:  None  Complexity of Problems Addressed Acute illness or injury that poses threat of life of bodily function  Additional Data Reviewed and Analyzed Further history obtained from: Further history from spouse/family member  Additional Factors Impacting ED Encounter  Risk Prescriptions  Elmer Sow. Pilar Plate, MD [facility] Emergency Medicine [facility] [email or web address]  Final Clinical Impressions(s) / ED Diagnoses     ICD-10-CM   1. Pain of right upper extremity  M79.601       ED Discharge Orders          Ordered    lidocaine (LIDODERM) 5 %  Every 24 hours        06/16/23 0712             Discharge Instructions Discussed with and Provided to Patient:    Discharge Instructions      You were evaluated in the Emergency Department and after careful evaluation, we did not find any emergent condition requiring admission or further testing in the hospital.  Your exam/testing today was overall reassuring.  X-rays did not show any abnormalities.  Suspect your pain is related to underlying arthritis.  Continue your home pain medicine, can use the numbing patches as needed.  Follow-up with your regular doctors and/or orthopedic specialist.  Please return to the Emergency Department if you experience any worsening of your condition.  Thank you for allowing [place] to be a part of your care.       Sabas Sous, MD 06/16/23 [phone]

## 2023-06-16 NOTE — ED Triage Notes (Signed)
Pt c/o right arm pain and left knee pain; pt states the pain has been going on for a couple of weeks and states she went to her PCP x 2 weeks ago and received a steroid shot to her right arm  Pt denies any injury to arm or knee

## 2023-06-22 DIAGNOSIS — I5032 Chronic diastolic (congestive) heart failure: Secondary | ICD-10-CM | POA: Diagnosis not present

## 2023-06-22 DIAGNOSIS — I255 Ischemic cardiomyopathy: Secondary | ICD-10-CM | POA: Diagnosis not present

## 2023-06-22 DIAGNOSIS — I480 Paroxysmal atrial fibrillation: Secondary | ICD-10-CM | POA: Diagnosis not present

## 2023-06-22 DIAGNOSIS — M159 Polyosteoarthritis, unspecified: Secondary | ICD-10-CM | POA: Diagnosis not present

## 2023-06-22 DIAGNOSIS — M19079 Primary osteoarthritis, unspecified ankle and foot: Secondary | ICD-10-CM | POA: Diagnosis not present

## 2023-06-22 DIAGNOSIS — G894 Chronic pain syndrome: Secondary | ICD-10-CM | POA: Diagnosis not present

## 2023-07-06 ENCOUNTER — Other Ambulatory Visit: Payer: Self-pay | Admitting: Internal Medicine

## 2023-07-06 NOTE — Telephone Encounter (Signed)
Prescription refill request for Eliquis received. Indication: PAF Last office visit: 03/29/23  Rosette Reveal MD Scr: 0.79 on 06/02/23  Epic Age: 58 Weight: 125.4kg  Based on above findings Eliquis 5mg  twice daily is the appropriate dose.  Refill approved.

## 2023-07-16 ENCOUNTER — Encounter (HOSPITAL_COMMUNITY): Payer: Self-pay | Admitting: Pharmacy Technician

## 2023-07-16 ENCOUNTER — Emergency Department (HOSPITAL_COMMUNITY)
Admission: EM | Admit: 2023-07-16 | Discharge: 2023-07-16 | Disposition: A | Discharge status: Home / Self Care | Attending: Emergency Medicine | Admitting: Emergency Medicine

## 2023-07-16 ENCOUNTER — Emergency Department (HOSPITAL_COMMUNITY)

## 2023-07-16 ENCOUNTER — Other Ambulatory Visit: Payer: Self-pay

## 2023-07-16 DIAGNOSIS — M25562 Pain in left knee: Secondary | ICD-10-CM | POA: Insufficient documentation

## 2023-07-16 DIAGNOSIS — M5412 Radiculopathy, cervical region: Secondary | ICD-10-CM | POA: Insufficient documentation

## 2023-07-16 DIAGNOSIS — I509 Heart failure, unspecified: Secondary | ICD-10-CM | POA: Insufficient documentation

## 2023-07-16 DIAGNOSIS — J449 Chronic obstructive pulmonary disease, unspecified: Secondary | ICD-10-CM | POA: Insufficient documentation

## 2023-07-16 DIAGNOSIS — Z7901 Long term (current) use of anticoagulants: Secondary | ICD-10-CM | POA: Insufficient documentation

## 2023-07-16 DIAGNOSIS — M25569 Pain in unspecified knee: Secondary | ICD-10-CM | POA: Diagnosis not present

## 2023-07-16 DIAGNOSIS — M9931 Osseous stenosis of neural canal of cervical region: Secondary | ICD-10-CM | POA: Diagnosis not present

## 2023-07-16 DIAGNOSIS — Z7982 Long term (current) use of aspirin: Secondary | ICD-10-CM | POA: Insufficient documentation

## 2023-07-16 DIAGNOSIS — I2510 Atherosclerotic heart disease of native coronary artery without angina pectoris: Secondary | ICD-10-CM | POA: Insufficient documentation

## 2023-07-16 DIAGNOSIS — M542 Cervicalgia: Secondary | ICD-10-CM | POA: Diagnosis not present

## 2023-07-16 DIAGNOSIS — M25511 Pain in right shoulder: Secondary | ICD-10-CM | POA: Insufficient documentation

## 2023-07-16 DIAGNOSIS — G8929 Other chronic pain: Secondary | ICD-10-CM | POA: Diagnosis not present

## 2023-07-16 DIAGNOSIS — M5011 Cervical disc disorder with radiculopathy,  high cervical region: Secondary | ICD-10-CM | POA: Diagnosis not present

## 2023-07-16 LAB — BASIC METABOLIC PANEL
Anion gap: 11 (ref 5–15)
BUN: 14 mg/dL (ref 6–20)
CO2: 25 mmol/L (ref 22–32)
Calcium: 8.9 mg/dL (ref 8.9–10.3)
Chloride: 103 mmol/L (ref 98–111)
Creatinine, Ser: 0.69 mg/dL (ref 0.44–1.00)
GFR, Estimated: >60 mL/min (ref >60)
Glucose, Bld: 86 mg/dL (ref 70–99)
Potassium: 3.9 mmol/L (ref 3.5–5.1)
Sodium: 139 mmol/L (ref 135–145)

## 2023-07-16 LAB — CBC
HCT: 34.6 % — ABNORMAL LOW (ref 36.0–46.0)
Hemoglobin: 10.3 g/dL — ABNORMAL LOW (ref 12.0–15.0)
MCH: 25.5 pg — ABNORMAL LOW (ref 26.0–34.0)
MCHC: 29.8 g/dL — ABNORMAL LOW (ref 30.0–36.0)
MCV: 85.6 fL (ref 80.0–100.0)
Platelets: 230 10*3/uL (ref 150–400)
RBC: 4.04 MIL/uL (ref 3.87–5.11)
RDW: 16.9 % — ABNORMAL HIGH (ref 11.5–15.5)
WBC: 9.1 10*3/uL (ref 4.0–10.5)
nRBC: 0 % (ref 0.0–0.2)

## 2023-07-16 LAB — URIC ACID: Uric Acid, Serum: 4.2 mg/dL (ref 2.5–7.1)

## 2023-07-16 MED ORDER — HYDROMORPHONE HCL 1 MG/ML IJ SOLN
1.0000 mg | Freq: Once | INTRAMUSCULAR | Status: AC
  Administered 2023-07-16: 1 mg via INTRAMUSCULAR
  Filled 2023-07-16: qty 1

## 2023-07-16 MED ORDER — LIDOCAINE 5 % EX PTCH: 1.0000 | MEDICATED_PATCH | CUTANEOUS | 0 refills | Status: DC

## 2023-07-16 MED ORDER — DEXAMETHASONE SODIUM PHOSPHATE 10 MG/ML IJ SOLN
10.0000 mg | Freq: Once | INTRAMUSCULAR | Status: AC
Start: 2023-07-16 — End: 2023-07-16
  Administered 2023-07-16: 10 mg via INTRAMUSCULAR
  Filled 2023-07-16: qty 1

## 2023-07-16 MED ORDER — PREDNISONE 10 MG PO TABS: ORAL_TABLET | ORAL | 0 refills | Status: DC

## 2023-07-16 MED ORDER — LIDOCAINE 5 % EX PTCH
1.0000 | MEDICATED_PATCH | CUTANEOUS | Status: DC
Start: 2023-07-16 — End: 2023-07-17
  Administered 2023-07-16: 1 via TRANSDERMAL
  Filled 2023-07-16: qty 1

## 2023-07-16 NOTE — ED Provider Notes (Signed)
 [facility] EMERG[facility] DEPARTMENT AT [facility] Provider Note   CSN: [id number] Arrival date & time: 07/16/23  1302     History  No chief complaint on file.   Kristine Montgomery is a 58 y.o. female with a history including COPD, CHF, CAD, history of arthritis, rheumatoid arthritis, also has documentation of gout on her chart although patient denies knowing of this diagnosis.  However she presents with ongoing complaint of severe pain in her right shoulder and also her left knee.  Pain is sharp, constant worsened with dependency of the shoulder, better when she supports her arm but never resolves.  She denies any new injuries.  Denies fevers or chills, swelling, skin changes, erythema.  No weakness or numbness in her hands or fingertips but has pain that radiates into her hand intermittently.  She was seen for the same complaint here  last month, she received steroids and pain medicine and she did get some improvement for a while but her symptom has gradually worsened once again.  She has not sought care of her primary provider for this complaint.  She denies neck pain or injury, but does endorse tenderness palpation of the muscles of her right posterior shoulder and scapular region.  She denies shortness of breath, cough, there is no pleuritic component to her symptoms.  The history is provided by the patient.       Home Medications Prior to Admission medications   Medication Sig Start Date End Date Taking? Authorizing Provider  aspirin EC 81 MG tablet Take 81 mg by mouth daily. Swallow whole.   Yes [provider]  atorvastatin (LIPITOR) 80 MG tablet TAKE (1) TABLET BY MOUTH ONCE DAILY AT 6 PM Patient taking differently: Take 80 mg by mouth daily. 08/20/22  Yes Pricilla Riffle, MD  dapagliflozin propanediol (FARXIGA) 10 MG TABS tablet Take 1 tablet (10 mg total) by mouth daily. 05/09/23  Yes Pricilla Riffle, MD  dronedarone (MULTAQ) 400 MG tablet Take 1 tablet (400 mg total) by  mouth 2 (two) times daily with a meal. 06/14/23  Yes Pricilla Riffle, MD  ELIQUIS 5 MG TABS tablet TAKE ONE TABLET BY MOUTH 2 TIMES A DAY 07/06/23  Yes Pricilla Riffle, MD  famotidine (PEPCID) 40 MG tablet TAKE ONE TABLET BY MOUTH IN THE EVENING. 11/24/22  Yes Pricilla Riffle, MD  gabapentin (NEURONTIN) 100 MG capsule Take 1 capsule (100 mg total) by mouth 2 (two) times daily. 01/27/23  Yes Antony Madura, MD  HYDROcodone-acetaminophen (NORCO) 10-325 MG tablet Take 1 tablet by mouth every 6 (six) hours as needed for moderate pain (pain score 4-6) or severe pain (pain score 7-10).   Yes [provider]  metoprolol succinate (TOPROL-XL) 25 MG 24 hr tablet TAKE 1/2 TABLET BY MOUTH ONCE DAILY. 06/07/22  Yes Pricilla Riffle, MD  nitroGLYCERIN (NITROSTAT) 0.4 MG SL tablet PLACE 1 TABLET (0.4 MG TOTAL) UNDER THE TONGUE EVERY FIVE MINUTES X 3 DOSES AS NEEDED FOR CHEST PAIN. Patient taking differently: Place 0.4 mg under the tongue every 5 (five) minutes as needed for chest pain. 07/31/20 07/16/23 Yes Bhagat, Bhavinkumar, PA  predniSONE (DELTASONE) 10 MG tablet 6, 5, 4, 3, 2 then 1 tablet by mouth daily for 6 days total. 07/16/23  Yes Hailley Byers, Raynelle Fanning, PA-C  sacubitril-valsartan (ENTRESTO) 24-26 MG Take 1 tablet by mouth 2 (two) times daily. 01/18/23  Yes Pricilla Riffle, MD  torsemide (DEMADEX) 20 MG tablet Take 1 tablet (20 mg  total) by mouth daily. 03/29/23  Yes Marinus Maw, MD  lidocaine (LIDODERM) 5 % Place 1 patch onto the skin daily. Remove & Discard patch within 12 hours or as directed by MD.  Apply to your right shoulder at site of maximum pain 07/16/23  Yes Marleta Lapierre, Raynelle Fanning, PA-C      Allergies    Patient has no known allergies.    Review of Systems   Review of Systems  Constitutional:  Negative for chills and fever.  Musculoskeletal:  Positive for arthralgias. Negative for joint swelling and myalgias.  Neurological:  Negative for weakness and numbness.    Physical Exam Updated Vital Signs BP 105/63   Pulse  85   Temp 98.2 F (36.8 C) (Oral)   Resp 20   LMP 06/27/2017 (Within Weeks)   SpO2 98%  Physical Exam Constitutional:      Appearance: She is well-developed.  HENT:     Head: Atraumatic.  Cardiovascular:     Comments: Pulses equal bilaterally Musculoskeletal:        General: Tenderness present. No signs of injury.     Right shoulder: Tenderness present. Decreased range of motion.     Cervical back: Normal range of motion.     Comments: Generalized tenderness around the right shoulder and the right trapezius musculature without localizing pain.  No erythema or rash.  She has no pain with passive range of motion, severe pain with attempts at active extension above shoulder level.  Skin:    General: Skin is warm and dry.  Neurological:     Mental Status: She is alert.     Sensory: No sensory deficit.     Motor: No weakness.     Deep Tendon Reflexes: Reflexes normal.     ED Results / Procedures / Treatments   Labs (all labs ordered are listed, but only abnormal results are displayed) Labs Reviewed  CBC - Abnormal; Notable for the following components:      Result Value   Hemoglobin 10.3 (*)    HCT 34.6 (*)    MCH 25.5 (*)    MCHC 29.8 (*)    RDW 16.9 (*)    All other components within normal limits  URIC ACID  BASIC METABOLIC PANEL    EKG None  Radiology CT Cervical Spine Wo Contrast Result Date: 07/16/2023 CLINICAL DATA:  Cervical radiculopathy, no red flags EXAM: CT CERVICAL SPINE WITHOUT CONTRAST TECHNIQUE: Multidetector CT imaging of the cervical spine was performed without intravenous contrast. Multiplanar CT image reconstructions were also generated. RADIATION DOSE REDUCTION: This exam was performed according to the departmental dose-optimization program which includes automated exposure control, adjustment of the mA and/or kV according to patient size and/or use of iterative reconstruction technique. COMPARISON:  None Available. FINDINGS: Alignment: Normal. Skull  base and vertebrae: No acute fracture. No primary bone lesion or focal pathologic process. Soft tissues and spinal canal: No prevertebral fluid or swelling. No visible canal hematoma. Disc levels: C2-C3 mild disc bulge.  Mild right neural foraminal stenosis. C3-C4: Minimal disc bulge. No spinal canal or neural foraminal stenosis. C4-C5: Broad-based disc bulge. Posterior disc osteophyte complex causes mild spinal canal stenosis. There is mild right neural foraminal stenosis. C5-C6: Posterior disc osteophyte complex causes narrowing of the spinal canal. Suspected right neural foraminal stenosis. C6-C7: Posterior disc osteophyte complex causes spinal canal narrowing. No definite neural foraminal narrowing. C7-T1: No spinal canal or neural foraminal narrowing. Upper chest: Negative. Other: None. IMPRESSION: 1. Multilevel degenerative disc disease causing  varying degrees of spinal canal and right neural foraminal stenosis. Consider MRI for further assessment as clinically indicated. 2. No fracture or acute osseous findings. Electronically Signed   By: Narda Rutherford M.D.   On: 07/16/2023 19:43    Procedures Procedures    Medications Ordered in ED Medications  lidocaine (LIDODERM) 5 % 1 patch (1 patch Transdermal Patch Applied 07/16/23 1954)  HYDROmorphone (DILAUDID) injection 1 mg (1 mg Intramuscular Given 07/16/23 1523)  dexamethasone (DECADRON) injection 10 mg (10 mg Intramuscular Given 07/16/23 1837)    ED Course/ Medical Decision Making/ A&P                                 Medical Decision Making Pt with acute right shoulder pain,  present now for several weeks but now worsened.  Reproducible with active ROM and palpation of trapezius musculature right shoulder and neck,  no pain with passive ROM suggesting MSK source,  possible muscle spasm, could also have rotator problem not discernable with plain film imaging.  CT c spine obtained given intermittent radiation to right hand and fingertips. Offered  sling,  pt states she cannot tolerate, but has one.    CT with sig cervical ddd with right foraminal stenosis, MRI recommended by not available here tonight.  She will need neurosurgical consultation , referred to Dr Wynetta Emery.  In interim,  prednisone taper provided.  She is already prescribed chronic hydrocodone.  No neuro deficit on exam at this time,  but return precautions discussed.  Amount and/or Complexity of Data Reviewed External Data Reviewed: radiology.    Details: Imaging including right shoulder and humerus from visit January 30 was reviewed, no acute findings, no new injuries, repeat joint imaging not indicated.  With pain rated into her hand and tenderness along her right trapezius and soft tissue neck, CT C-spine ordered to assess for cervical radiculopathy.  Patient with severe right shoulder pain which is reproducible with patient with right shoulder pain patient Labs: ordered.    Details: Be met, CBC and uric acid levels obtained and normal range.  This does not appear to be a gout flare. Radiology: ordered.    Details: CT c spine ordered to rule out cervical radiculopathy  Risk Prescription drug management.           Final Clinical Impression(s) / ED Diagnoses Final diagnoses:  Acute pain of right shoulder  Chronic pain of left knee  Cervical radiculopathy    Rx / DC Orders ED Discharge Orders          Ordered    lidocaine (LIDODERM) 5 %  Every 24 hours        07/16/23 1941    predniSONE (DELTASONE) 10 MG tablet        07/16/23 1955              Victoriano Lain 07/16/23 Thayer Dallas, MD 07/19/23 [id number]

## 2023-07-16 NOTE — Discharge Instructions (Addendum)
 Your lab tests are normal, with a normal uric acid,  this is less likely to be a flare of your gout.  You may have a rotator injury in your shoulder and may need additional tests .  However,  your CT scan does give evidence that this may be a nerve problem as you do have significant findings in your cervical neck which could also be the source of your symptoms.  Call Dr. Wynetta Emery for an office visit for further evaluation of this.  In the interim, you may try the pain patch provided - if helpful,  get the prescription filled - this can be placed every 24 hours.  Continue taking your hydrocodone.

## 2023-07-16 NOTE — ED Notes (Signed)
 ED PA at bedside reassessing pt

## 2023-07-16 NOTE — ED Triage Notes (Signed)
 Pt bib ems from with R neck, arm and knee pain. Pain is worse with lifting her arm up. Seen here 3 weeks ago for her knee and was told it was arthritis.  140/71 HR 81 98% RA CBG 120

## 2023-07-26 ENCOUNTER — Telehealth: Payer: Self-pay | Admitting: Neurology

## 2023-07-26 ENCOUNTER — Other Ambulatory Visit: Payer: Self-pay

## 2023-07-26 DIAGNOSIS — R208 Other disturbances of skin sensation: Secondary | ICD-10-CM

## 2023-07-26 DIAGNOSIS — R209 Unspecified disturbances of skin sensation: Secondary | ICD-10-CM

## 2023-07-26 MED ORDER — GABAPENTIN 100 MG PO CAPS: 100.0000 mg | ORAL_CAPSULE | Freq: Two times a day (BID) | ORAL | 2 refills | Status: DC

## 2023-07-26 NOTE — Telephone Encounter (Signed)
 Pt needs a refill on her gabapentin 100mg .  She takes twice daily.  Pharmacy-[facility]

## 2023-08-01 DIAGNOSIS — I480 Paroxysmal atrial fibrillation: Secondary | ICD-10-CM | POA: Diagnosis not present

## 2023-08-01 DIAGNOSIS — G894 Chronic pain syndrome: Secondary | ICD-10-CM | POA: Diagnosis not present

## 2023-08-01 DIAGNOSIS — I255 Ischemic cardiomyopathy: Secondary | ICD-10-CM | POA: Diagnosis not present

## 2023-08-01 DIAGNOSIS — M159 Polyosteoarthritis, unspecified: Secondary | ICD-10-CM | POA: Diagnosis not present

## 2023-08-01 DIAGNOSIS — L03119 Cellulitis of unspecified part of limb: Secondary | ICD-10-CM | POA: Diagnosis not present

## 2023-08-01 DIAGNOSIS — I5032 Chronic diastolic (congestive) heart failure: Secondary | ICD-10-CM | POA: Diagnosis not present

## 2023-08-01 DIAGNOSIS — I110 Hypertensive heart disease with heart failure: Secondary | ICD-10-CM | POA: Diagnosis not present

## 2023-08-11 ENCOUNTER — Telehealth: Payer: Self-pay | Admitting: Internal Medicine

## 2023-08-11 MED ORDER — ATORVASTATIN CALCIUM 80 MG PO TABS: 80.0000 mg | ORAL_TABLET | Freq: Every day | ORAL | 3 refills | Status: DC

## 2023-08-11 MED ORDER — METOPROLOL SUCCINATE ER 25 MG PO TB24: 12.5000 mg | ORAL_TABLET | Freq: Every day | ORAL | 3 refills | Status: DC

## 2023-08-11 NOTE — Telephone Encounter (Signed)
*  STAT* If patient is at the pharmacy, call can be transferred to refill team.   1. Which medications need to be refilled? (please list name of each medication and dose if known)   metoprolol succinate (TOPROL-XL) 25 MG 24 hr tablet    atorvastatin (LIPITOR) 80 MG tablet    4. Which pharmacy/location (including street and city if local pharmacy) is medication to be sent to?  [facility] - [place], [place] - [address]     5. Do they need a 30 day or 90 day supply? 90

## 2023-08-11 NOTE — Telephone Encounter (Signed)
 Medication refill completed

## 2023-08-25 DIAGNOSIS — G894 Chronic pain syndrome: Secondary | ICD-10-CM | POA: Diagnosis not present

## 2023-08-25 DIAGNOSIS — I480 Paroxysmal atrial fibrillation: Secondary | ICD-10-CM | POA: Diagnosis not present

## 2023-08-25 DIAGNOSIS — Z6841 Body Mass Index (BMI) 40.0 and over, adult: Secondary | ICD-10-CM | POA: Diagnosis not present

## 2023-08-25 DIAGNOSIS — I255 Ischemic cardiomyopathy: Secondary | ICD-10-CM | POA: Diagnosis not present

## 2023-08-25 DIAGNOSIS — M159 Polyosteoarthritis, unspecified: Secondary | ICD-10-CM | POA: Diagnosis not present

## 2023-08-25 DIAGNOSIS — I5032 Chronic diastolic (congestive) heart failure: Secondary | ICD-10-CM | POA: Diagnosis not present

## 2023-08-25 DIAGNOSIS — M2550 Pain in unspecified joint: Secondary | ICD-10-CM | POA: Diagnosis not present

## 2023-09-01 ENCOUNTER — Telehealth: Payer: Self-pay | Admitting: Internal Medicine

## 2023-09-01 ENCOUNTER — Telehealth: Payer: Self-pay | Admitting: Pharmacy Technician

## 2023-09-01 ENCOUNTER — Encounter: Admitting: Orthopedic Surgery

## 2023-09-01 NOTE — Telephone Encounter (Signed)
 Patient is approved with [employer] for multaq until 05/16/24. It was mailed out today per [employer]. They said every 84 days it mails out. I called the patient and made her aware

## 2023-09-01 NOTE — Telephone Encounter (Signed)
 Pt c/o medication issue:  1. Name of Medication: dronedarone (MULTAQ) 400 MG tablet   2. How are you currently taking this medication (dosage and times per day)? Take 1 tablet (400 mg total) by mouth 2 (two) times daily with a meal.   3. Are you having a reaction (difficulty breathing--STAT)? no   4. What is your medication issue? Patient was told to call when she only has one bottle remaining

## 2023-09-01 NOTE — Telephone Encounter (Signed)
 Patient is approved with [employer] for multaq until 05/16/24. It was mailed out today. They said every 84 days it mails out. I called the patient and made her aware.

## 2023-09-01 NOTE — Telephone Encounter (Signed)
 Patient gets Multaq thru patient assistance.

## 2023-09-03 DIAGNOSIS — I110 Hypertensive heart disease with heart failure: Secondary | ICD-10-CM | POA: Insufficient documentation

## 2023-09-03 DIAGNOSIS — G894 Chronic pain syndrome: Secondary | ICD-10-CM | POA: Diagnosis not present

## 2023-09-03 DIAGNOSIS — Z6841 Body Mass Index (BMI) 40.0 and over, adult: Secondary | ICD-10-CM | POA: Diagnosis not present

## 2023-09-03 DIAGNOSIS — I255 Ischemic cardiomyopathy: Secondary | ICD-10-CM | POA: Diagnosis not present

## 2023-09-03 DIAGNOSIS — I252 Old myocardial infarction: Secondary | ICD-10-CM | POA: Diagnosis not present

## 2023-09-03 DIAGNOSIS — M159 Polyosteoarthritis, unspecified: Secondary | ICD-10-CM | POA: Diagnosis not present

## 2023-09-03 DIAGNOSIS — I480 Paroxysmal atrial fibrillation: Secondary | ICD-10-CM | POA: Diagnosis not present

## 2023-09-03 DIAGNOSIS — I5032 Chronic diastolic (congestive) heart failure: Secondary | ICD-10-CM | POA: Insufficient documentation

## 2023-09-05 ENCOUNTER — Telehealth: Payer: Self-pay | Admitting: *Deleted

## 2023-09-05 NOTE — Telephone Encounter (Signed)
 Called and notified pt that her Multaq  has arrived in office. Pt voiced understanding and states that she will pick it up tomorrow.

## 2023-09-06 DIAGNOSIS — M159 Polyosteoarthritis, unspecified: Secondary | ICD-10-CM | POA: Diagnosis not present

## 2023-09-06 DIAGNOSIS — I5032 Chronic diastolic (congestive) heart failure: Secondary | ICD-10-CM | POA: Diagnosis not present

## 2023-09-06 DIAGNOSIS — Z6841 Body Mass Index (BMI) 40.0 and over, adult: Secondary | ICD-10-CM | POA: Diagnosis not present

## 2023-09-06 DIAGNOSIS — I110 Hypertensive heart disease with heart failure: Secondary | ICD-10-CM | POA: Diagnosis not present

## 2023-09-06 DIAGNOSIS — I255 Ischemic cardiomyopathy: Secondary | ICD-10-CM | POA: Diagnosis not present

## 2023-09-06 DIAGNOSIS — G894 Chronic pain syndrome: Secondary | ICD-10-CM | POA: Diagnosis not present

## 2023-09-06 DIAGNOSIS — I252 Old myocardial infarction: Secondary | ICD-10-CM | POA: Diagnosis not present

## 2023-09-06 DIAGNOSIS — I480 Paroxysmal atrial fibrillation: Secondary | ICD-10-CM | POA: Diagnosis not present

## 2023-09-09 DIAGNOSIS — I255 Ischemic cardiomyopathy: Secondary | ICD-10-CM | POA: Diagnosis not present

## 2023-09-09 DIAGNOSIS — I480 Paroxysmal atrial fibrillation: Secondary | ICD-10-CM | POA: Diagnosis not present

## 2023-09-09 DIAGNOSIS — I110 Hypertensive heart disease with heart failure: Secondary | ICD-10-CM | POA: Diagnosis not present

## 2023-09-09 DIAGNOSIS — M159 Polyosteoarthritis, unspecified: Secondary | ICD-10-CM | POA: Diagnosis not present

## 2023-09-09 DIAGNOSIS — Z6841 Body Mass Index (BMI) 40.0 and over, adult: Secondary | ICD-10-CM | POA: Diagnosis not present

## 2023-09-09 DIAGNOSIS — I5032 Chronic diastolic (congestive) heart failure: Secondary | ICD-10-CM | POA: Diagnosis not present

## 2023-09-09 DIAGNOSIS — G894 Chronic pain syndrome: Secondary | ICD-10-CM | POA: Diagnosis not present

## 2023-09-09 DIAGNOSIS — I252 Old myocardial infarction: Secondary | ICD-10-CM | POA: Diagnosis not present

## 2023-09-13 DIAGNOSIS — M159 Polyosteoarthritis, unspecified: Secondary | ICD-10-CM | POA: Diagnosis not present

## 2023-09-13 DIAGNOSIS — I480 Paroxysmal atrial fibrillation: Secondary | ICD-10-CM | POA: Diagnosis not present

## 2023-09-13 DIAGNOSIS — G894 Chronic pain syndrome: Secondary | ICD-10-CM | POA: Diagnosis not present

## 2023-09-13 DIAGNOSIS — I252 Old myocardial infarction: Secondary | ICD-10-CM | POA: Diagnosis not present

## 2023-09-13 DIAGNOSIS — Z6841 Body Mass Index (BMI) 40.0 and over, adult: Secondary | ICD-10-CM | POA: Diagnosis not present

## 2023-09-13 DIAGNOSIS — I255 Ischemic cardiomyopathy: Secondary | ICD-10-CM | POA: Diagnosis not present

## 2023-09-13 DIAGNOSIS — I110 Hypertensive heart disease with heart failure: Secondary | ICD-10-CM | POA: Diagnosis not present

## 2023-09-13 DIAGNOSIS — I5032 Chronic diastolic (congestive) heart failure: Secondary | ICD-10-CM | POA: Diagnosis not present

## 2023-09-14 DIAGNOSIS — I5032 Chronic diastolic (congestive) heart failure: Secondary | ICD-10-CM | POA: Diagnosis not present

## 2023-09-14 DIAGNOSIS — I110 Hypertensive heart disease with heart failure: Secondary | ICD-10-CM | POA: Diagnosis not present

## 2023-09-14 DIAGNOSIS — G894 Chronic pain syndrome: Secondary | ICD-10-CM | POA: Diagnosis not present

## 2023-09-14 DIAGNOSIS — M159 Polyosteoarthritis, unspecified: Secondary | ICD-10-CM | POA: Diagnosis not present

## 2023-09-15 ENCOUNTER — Encounter: Admitting: Orthopedic Surgery

## 2023-09-19 NOTE — Progress Notes (Signed)
 I saw Kristine Montgomery in neurology clinic on 09/28/23 in follow up for burning pain in feet.  HPI: Kristine Montgomery is a 58 y.o. year old female with a history of HFpEF, CAD c/b MI, afib w/ RVR (on eliquis ), HTN, COPD, HLD, rheumatoid arthritis, and former smoker who we last saw on 01/27/23.  To briefly review: 01/27/23: Patient has burning pain in bilateral feet for the last 4-5 months. Patient has seen podiatry with concern for potential gout, but was told she did not have this. There was concern for neuropathy, so patient was sent to neurology. She has occasional back but this does not radiate into her leg. Symptoms are not present at night when in bed or when laying down. The symptoms occur when she is on her feet. Just putting her feet down on the carpet to go to the bathroom will cause burning pain. When she sits down the burning will go away. She will have pills and needles in her feet that is constant though. She mentions that when her feet are cold, her big will go purple. Patient was given steroids for pain in feet that helped, but never been given neuropathic pain medication.   Patient uses a rollator around the house due to arthritis and pain in her knees. She will use a wheelchair when going longer distances. She does think her legs are weak. She has fallen two times this year due to the knees.   Patient had ABI on 01/25/23. The preliminary results appear to suggest a normal test.   Patient denies autonomic symptoms (impaired sweating, bloating, early satiety, bowel or bladder changes, syncope/near syncope).   She does not report any constitutional symptoms like fever, night sweats, anorexia or unintentional weight loss.   EtOH use: None  Restrictive diet? No Family history of neuropathy/myopathy/neurologic disease? Father has neuropathy from chemotherapy (lung cancer)  Most recent Assessment and Plan (01/27/23): Kristine Montgomery is a 58 y.o. female who presents for evaluation of burning  pain in her feet. She has a relevant medical history of HFpEF, CAD c/b MI, afib w/ RVR (on eliquis ), HTN, COPD, HLD, rheumatoid arthritis, and former smoker. Her neurological examination is pertinent for poor ambulation due to knee pain, but essentially normal strength, sensation, and reflexes. Available diagnostic data is significant for HbA1c of 5.1, elevated RF. The etiology of patient's symptoms is currently unclear. While burning in feet could be consistent with neuropathy, her normal examination, including reflexes does not clear support this. I will look for treatable causes and get an EMG to clarify.   PLAN: -Blood work: B1, B12, IFE, copper , vit E -EMG: Bilateral LE (L > R) -If EMG is negative, may consider skin biopsy for small fiber neuropathy -Will start gabapentin  100 mg BID  Since their last visit: Labs were significant for low B1 and borderline low B12. I recommended supplementation of both. At EMG on 04/11/23, patient had still not gotten these but planned to do so. She is now taking both. She is also taking gabpentin 100 mg BID. With these, she no longer has the burning in her feet.  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  She still uses a walker or wheelchair for longer distances due to arthritis and knee pain. She sees ortho for this. She has had repair on the right but not the left. Due to her heart issues, she is not wanting  further surgery.  She has PT come to house to help with strength and balance.  She has no new complaints.   MEDICATIONS:  Outpatient Encounter Medications as of 09/28/2023  Medication Sig Note   aspirin  EC 81 MG tablet Take 81 mg by mouth daily. Swallow whole.    atorvastatin  (LIPITOR ) 80 MG tablet Take 1 tablet (80 mg total) by mouth daily.    dapagliflozin  propanediol (FARXIGA ) 10 MG TABS tablet Take 1 tablet (10 mg total) by mouth daily.     dronedarone  (MULTAQ ) 400 MG tablet Take 1 tablet (400 mg total) by mouth 2 (two) times daily with a meal.    ELIQUIS  5 MG TABS tablet TAKE ONE TABLET BY MOUTH 2 TIMES A DAY    famotidine  (PEPCID ) 40 MG tablet TAKE ONE TABLET BY MOUTH IN THE EVENING.    gabapentin  (NEURONTIN ) 100 MG capsule Take 1 capsule (100 mg total) by mouth 2 (two) times daily.    HYDROcodone -acetaminophen  (NORCO) 10-325 MG tablet Take 1 tablet by mouth every 6 (six) hours as needed for moderate pain (pain score 4-6) or severe pain (pain score 7-10).    lidocaine  (LIDODERM ) 5 % Place 1 patch onto the skin daily. Remove & Discard patch within 12 hours or as directed by MD.  Apply to your right shoulder at site of maximum pain    metoprolol  succinate (TOPROL -XL) 25 MG 24 hr tablet Take 0.5 tablets (12.5 mg total) by mouth daily.    nitroGLYCERIN  (NITROSTAT ) 0.4 MG SL tablet PLACE 1 TABLET (0.4 MG TOTAL) UNDER THE TONGUE EVERY FIVE MINUTES X 3 DOSES AS NEEDED FOR CHEST PAIN. (Patient taking differently: Place 0.4 mg under the tongue every 5 (five) minutes as needed for chest pain.) 03/15/2023: Pt needs a refill   predniSONE  (DELTASONE ) 10 MG tablet 6, 5, 4, 3, 2 then 1 tablet by mouth daily for 6 days total.    sacubitril -valsartan  (ENTRESTO ) 24-26 MG Take 1 tablet by mouth 2 (two) times daily.    torsemide  (DEMADEX ) 20 MG tablet Take 1 tablet (20 mg total) by mouth daily.    No facility-administered encounter medications on file as of 09/28/2023.    PAST MEDICAL HISTORY: Past Medical History:  Diagnosis Date   Acute combined systolic and diastolic CHF, NYHA class 4 (HCC) 07/29/2020   a. EF 30% by echo in 07/2020 b. EF 55-60% by echo in 11/2020   Arthritis    Asthma    CAD (coronary artery disease)    a. 07/2020: s/p NSTEMI with DES to proxLAD and DES to proxLCx with medical management recommended of the occluded RCA.   Chronic bronchitis (HCC)    COPD (chronic obstructive pulmonary disease) (HCC)    Edema extremities     History of hiatal hernia    Hyperlipidemia LDL goal <70 07/29/2020   Ovarian cyst    Pneumonia    Rheumatoid arthritis (HCC)     PAST SURGICAL HISTORY: Past Surgical History:  Procedure Laterality Date   CESAREAN SECTION     CORONARY STENT INTERVENTION N/A 07/29/2020   Procedure: CORONARY STENT INTERVENTION;  Surgeon: Millicent Ally, MD;  Location: [facility] INVASIVE CV [facility];  Service: Cardiovascular;  Laterality: N/A;   KNEE ARTHROSCOPY WITH MEDIAL MENISECTOMY Right 05/23/2020   Procedure: KNEE ARTHROSCOPY WITH MEDIAL AND LATERAL MENISECTOMY; 3 COMPARTMENT SYNOVECTOMY;  Surgeon: Darrin Emerald, MD;  Location: [facility];  Service: Orthopedics;  Laterality: Right;   RIGHT/LEFT HEART CATH AND CORONARY ANGIOGRAPHY N/A 07/29/2020   Procedure: RIGHT/LEFT  HEART CATH AND CORONARY ANGIOGRAPHY;  Surgeon: Millicent Ally, MD;  Location: [facility] IN[facility]IVE CV LAB;  Service: Cardiovascular;  Laterality: N/A;   TONSILLECTOMY     TUBAL LIGATION      ALLERGIES: No Known Allergies  FAMILY HISTORY: Family History  Problem Relation Age of Onset   Cancer Paternal Grandfather        colon   Cancer Maternal Grandmother        lung   COPD Father    Non-Hodgkin's lymphoma Mother    Diabetes Mother    Cancer Mother        cervical   Congestive Heart Failure Mother    Hypertension Sister    Other Son        fluid around heart    SOCIAL HISTORY: Social History   Tobacco Use   Smoking status: Former    Current packs/day: 0.00    Average packs/day: 0.5 packs/day for 30.0 years (15.0 ttl pk-yrs)    Types: Cigarettes    Start date: 07/1990    Quit date: 07/2020    Years since quitting: 3.2    Passive exposure: Past   Smokeless tobacco: Never  Vaping Use   Vaping status: Never Used  Substance Use Topics   Alcohol use: Not Currently   Drug use: No   Social History   Social History Narrative   Are you right handed or left handed? Right   Are you currently employed ?    What is your current  occupation? disabled   Do you live at home alone?yes   Who lives with you?    What type of home do you live in: 1 story or 2 story? one   Caffeine non     Objective:  Vital Signs:  BP 135/65   Pulse (!) 52   Ht 5\' 1"  (1.549 m)   Wt 281 lb (127.5 kg)   LMP 06/27/2017 (Within Weeks)   SpO2 96%   BMI 53.09 kg/m   General: No acute distress.  Patient appears well-groomed.   Head:  Normocephalic/atraumatic Neck: supple Lungs: Non-labored breathing on room air   Neurological Exam: Mental status: alert and oriented, speech fluent and not dysarthric, language intact.  Cranial nerves: CN I: not tested CN II: pupils equal, round and reactive to light, visual fields intact CN III, IV, VI:  full range of motion, no nystagmus, no ptosis CN V: facial sensation intact. CN VII: upper and lower face symmetric CN VIII: hearing intact CN IX, X: uvula midline CN XI: sternocleidomastoid and trapezius muscles intact CN XII: tongue midline  Bulk & Tone: normal. Motor:  muscle strength 5/5 throughout Deep Tendon Reflexes:  2+ throughout.   Sensation:  Pinprick sensation intact. Finger to nose testing:  Without dysmetria.   Gait:  Antalgic gait   Lab and Test Review: New results: 06/02/23: TSH wnl B12: 880 Vit D low at 13.00  01/27/23: B1 low at 7 B12: 309 IFE: no M protein Copper  wnl Vit E wnl  EMG (04/11/23): NCV & EMG Findings: Extensive electrodiagnostic evaluation of bilateral lower limbs shows: Bilateral sural and superficial peroneal/fibular sensory responses are absent. Bilateral peroneal/fibular (EDB) motor responses are absent. Left tibial (AH) and left peroneal/fibular (TA) motor responses show reduced amplitude (AH 1.90 mV, TA 2.2 mV). Right tibial (AH) and right peroneal/fibular (TA) motor responses are within normal limits. Bilateral H reflex latencies are within normal limits. There is no evidence of active or chronic motor axon loss changes affecting  any of the  tested muscles on needle examination. Motor unit configuration and recruitment pattern is within normal limits.   Impression: This is a technically challenging study. The findings are complicated by significant peripheral edema and patient's physiognomy. While there are absent sensory responses and asymmetry, the normal needle examination bilaterally suggests no significant abnormalities. 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  CT cervical spine wo contrast (07/16/23): FINDINGS: Alignment: Normal.   Skull base and vertebrae: No acute fracture. No primary bone lesion or focal pathologic process.   Soft tissues and spinal canal: No prevertebral fluid or swelling. No visible canal hematoma.   Disc levels:   C2-C3 mild disc bulge.  Mild right neural foraminal stenosis.   C3-C4: Minimal disc bulge. No spinal canal or neural foraminal stenosis.   C4-C5: Broad-based disc bulge. Posterior disc osteophyte complex causes mild spinal canal stenosis. There is mild right neural foraminal stenosis.   C5-C6: Posterior disc osteophyte complex causes narrowing of the spinal canal. Suspected right neural foraminal stenosis.   C6-C7: Posterior disc osteophyte complex causes spinal canal narrowing. No definite neural foraminal narrowing.   C7-T1: No spinal canal or neural foraminal narrowing.   Upper chest: Negative.   Other: None.   IMPRESSION: 1. Multilevel degenerative disc disease causing varying degrees of spinal canal and right neural foraminal stenosis. Consider MRI for further assessment as clinically indicated. 2. No fracture or acute osseous findings.  Previously reviewed results: 12/06/22: CBC: significant for Hb 10.1 (chronic), MCV 84.3 CMP: significant for glucose 101, albumin 2.9 Lipid panel: Component     Latest Ref Rng 12/06/2022  Cholesterol     0 - 200 mg/dL 295   Triglycerides     <150 mg/dL 88   HDL  Cholesterol     >40 mg/dL 40 (L)   Total CHOL/HDL Ratio     RATIO 3.1   VLDL     0 - 40 mg/dL 18   LDL (calc)     0 - 99 mg/dL 65     Ferritin (10/17/11): 297 ESR (04/21/22): 101 (> 140 1 year ago) HbA1c (02/18/22): 5.1   01/07/22: RF 482.3 ANA negative   TSH (03/24/21): wnl   Imaging: Bilateral venous US  (09/02/22): IMPRESSION: No evidence of deep venous thrombosis in either lower extremity.   Carotid ultrasound (09/02/21): IMPRESSION: Color duplex indicates minimal heterogeneous and calcified plaque, with no hemodynamically significant stenosis by duplex criteria in the extracranial cerebrovascular circulation.  ASSESSMENT: This is Kristine Montgomery, a 58 y.o. 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 There may also be contributions from arthritis. B1 deficiency B12 deficiency  Plan: -Continue B1 100 mg daily -Continue B12 1000 mcg daily -Can continue gabapentin  100 mg BID or can wean and taper if she would like -Fall precautions discussed  Return to clinic as needed  Rommie Coats, MD

## 2023-09-21 DIAGNOSIS — H524 Presbyopia: Secondary | ICD-10-CM | POA: Diagnosis not present

## 2023-09-21 DIAGNOSIS — H5203 Hypermetropia, bilateral: Secondary | ICD-10-CM | POA: Diagnosis not present

## 2023-09-21 DIAGNOSIS — H52223 Regular astigmatism, bilateral: Secondary | ICD-10-CM | POA: Diagnosis not present

## 2023-09-22 DIAGNOSIS — I110 Hypertensive heart disease with heart failure: Secondary | ICD-10-CM | POA: Diagnosis not present

## 2023-09-22 DIAGNOSIS — I255 Ischemic cardiomyopathy: Secondary | ICD-10-CM | POA: Diagnosis not present

## 2023-09-22 DIAGNOSIS — I5032 Chronic diastolic (congestive) heart failure: Secondary | ICD-10-CM | POA: Diagnosis not present

## 2023-09-22 DIAGNOSIS — I252 Old myocardial infarction: Secondary | ICD-10-CM | POA: Diagnosis not present

## 2023-09-22 DIAGNOSIS — M159 Polyosteoarthritis, unspecified: Secondary | ICD-10-CM | POA: Diagnosis not present

## 2023-09-22 DIAGNOSIS — I480 Paroxysmal atrial fibrillation: Secondary | ICD-10-CM | POA: Diagnosis not present

## 2023-09-22 DIAGNOSIS — Z6841 Body Mass Index (BMI) 40.0 and over, adult: Secondary | ICD-10-CM | POA: Diagnosis not present

## 2023-09-22 DIAGNOSIS — G894 Chronic pain syndrome: Secondary | ICD-10-CM | POA: Diagnosis not present

## 2023-09-23 DIAGNOSIS — M159 Polyosteoarthritis, unspecified: Secondary | ICD-10-CM | POA: Diagnosis not present

## 2023-09-23 DIAGNOSIS — I5032 Chronic diastolic (congestive) heart failure: Secondary | ICD-10-CM | POA: Diagnosis not present

## 2023-09-23 DIAGNOSIS — I480 Paroxysmal atrial fibrillation: Secondary | ICD-10-CM | POA: Diagnosis not present

## 2023-09-23 DIAGNOSIS — G894 Chronic pain syndrome: Secondary | ICD-10-CM | POA: Diagnosis not present

## 2023-09-23 DIAGNOSIS — I255 Ischemic cardiomyopathy: Secondary | ICD-10-CM | POA: Diagnosis not present

## 2023-09-27 ENCOUNTER — Ambulatory Visit: Payer: Medicare HMO | Admitting: Internal Medicine

## 2023-09-27 DIAGNOSIS — I5032 Chronic diastolic (congestive) heart failure: Secondary | ICD-10-CM | POA: Diagnosis not present

## 2023-09-27 DIAGNOSIS — G894 Chronic pain syndrome: Secondary | ICD-10-CM | POA: Diagnosis not present

## 2023-09-27 DIAGNOSIS — Z6841 Body Mass Index (BMI) 40.0 and over, adult: Secondary | ICD-10-CM | POA: Diagnosis not present

## 2023-09-27 DIAGNOSIS — M159 Polyosteoarthritis, unspecified: Secondary | ICD-10-CM | POA: Diagnosis not present

## 2023-09-27 DIAGNOSIS — I255 Ischemic cardiomyopathy: Secondary | ICD-10-CM | POA: Diagnosis not present

## 2023-09-27 DIAGNOSIS — I480 Paroxysmal atrial fibrillation: Secondary | ICD-10-CM | POA: Diagnosis not present

## 2023-09-27 DIAGNOSIS — I252 Old myocardial infarction: Secondary | ICD-10-CM | POA: Diagnosis not present

## 2023-09-27 DIAGNOSIS — I110 Hypertensive heart disease with heart failure: Secondary | ICD-10-CM | POA: Diagnosis not present

## 2023-09-28 ENCOUNTER — Ambulatory Visit (INDEPENDENT_AMBULATORY_CARE_PROVIDER_SITE_OTHER): Payer: Medicare HMO | Admitting: Neurology

## 2023-09-28 ENCOUNTER — Encounter: Payer: Self-pay | Admitting: Neurology

## 2023-09-28 VITALS — BP 135/65 | HR 52 | Ht 61.0 in | Wt 281.0 lb

## 2023-09-28 DIAGNOSIS — E519 Thiamine deficiency, unspecified: Secondary | ICD-10-CM

## 2023-09-28 DIAGNOSIS — M1990 Unspecified osteoarthritis, unspecified site: Secondary | ICD-10-CM | POA: Diagnosis not present

## 2023-09-28 DIAGNOSIS — E538 Deficiency of other specified B group vitamins: Secondary | ICD-10-CM | POA: Diagnosis not present

## 2023-09-28 DIAGNOSIS — R208 Other disturbances of skin sensation: Secondary | ICD-10-CM | POA: Diagnosis not present

## 2023-09-28 DIAGNOSIS — R269 Unspecified abnormalities of gait and mobility: Secondary | ICD-10-CM | POA: Diagnosis not present

## 2023-09-28 DIAGNOSIS — M069 Rheumatoid arthritis, unspecified: Secondary | ICD-10-CM | POA: Diagnosis not present

## 2023-09-28 NOTE — Patient Instructions (Signed)
 Continue B1 (also known as thiamine ) 100 mg daily.  Continue B12 1000 mcg daily.  You can continue gabapentin  100 mg twice daily, but you can stop it if you want to see if you still need it. It is not a problem to continue it if it helps with your pain though.  Follow up with me as needed.  The physicians and staff at [facility] are committed to providing excellent care. You may receive a survey requesting feedback about your experience at our office. We strive to receive "very good" responses to the survey questions. If you feel that your experience would prevent you from giving the office a "very good " response, please contact our office to try to remedy the situation. We may be reached at [phone]. Thank you for taking the time out of your busy day to complete the survey.  Rommie Coats, MD [facility]  Preventing Falls at [facility] are common, often dreaded events in the lives of older people. Aside from the obvious injuries and even death that may result, fall can cause wide-ranging consequences including loss of independence, mental decline, decreased activity and mobility. Younger people are also at risk of falling, especially those with chronic illnesses and fatigue.  Ways to reduce risk for falling Examine diet and medications. Warm foods and alcohol dilate blood vessels, which can lead to dizziness when standing. Sleep aids, antidepressants and pain medications can also increase the likelihood of a fall.  Get a vision exam. Poor vision, cataracts and glaucoma increase the chances of falling.  Check foot gear. Shoes should fit snugly and have a sturdy, nonskid sole and a broad, low heel  Participate in a physician-approved exercise program to build and maintain muscle strength and improve balance and coordination. Programs that use ankle weights or stretch bands are excellent for muscle-strengthening. Water aerobics programs and low-impact Tai Chi programs have also  been shown to improve balance and coordination.  Increase vitamin D  intake. Vitamin D  improves muscle strength and increases the amount of calcium  the body is able to absorb and deposit in bones.  How to prevent falls from common hazards Floors - Remove all loose wires, cords, and throw rugs. Minimize clutter. Make sure rugs are anchored and smooth. Keep furniture in its usual place.  Chairs -- Use chairs with straight backs, armrests and firm seats. Add firm cushions to existing pieces to add height.  Bathroom - Install grab bars and non-skid tape in the tub or shower. Use a bathtub transfer bench or a shower chair with a back support Use an elevated toilet seat and/or safety rails to assist standing from a low surface. Do not use towel racks or bathroom tissue holders to help you stand.  Lighting - Make sure halls, stairways, and entrances are well-lit. Install a night light in your bathroom or hallway. Make sure there is a light switch at the top and bottom of the staircase. Turn lights on if you get up in the middle of the night. Make sure lamps or light switches are within reach of the bed if you have to get up during the night.  Kitchen - Install non-skid rubber mats near the sink and stove. Clean spills immediately. Store frequently used utensils, pots, pans between waist and eye level. This helps prevent reaching and bending. Sit when getting things out of lower cupboards.  Living room/ Bedrooms - Place furniture with wide spaces in between, giving enough room to move around. Establish a route through  the living room that gives you something to hold onto as you walk.  Stairs - Make sure treads, rails, and rugs are secure. Install a rail on both sides of the stairs. If stairs are a threat, it might be helpful to arrange most of your activities on the lower level to reduce the number of times you must climb the stairs.  Entrances and doorways - Install metal handles on the walls adjacent to  the doorknobs of all doors to make it more secure as you travel through the doorway.  Tips for maintaining balance Keep at least one hand free at all times. Try using a backpack or fanny pack to hold things rather than carrying them in your hands. Never carry objects in both hands when walking as this interferes with keeping your balance.  Attempt to swing both arms from front to back while walking. This might require a conscious effort if Parkinson's disease has diminished your movement. It will, however, help you to maintain balance and posture, and reduce fatigue.  Consciously lift your feet off of the ground when walking. Shuffling and dragging of the feet is a common culprit in losing your balance.  When trying to navigate turns, use a "U" technique of facing forward and making a wide turn, rather than pivoting sharply.  Try to stand with your feet shoulder-length apart. When your feet are close together for any length of time, you increase your risk of losing your balance and falling.  Do one thing at a time. Don't try to walk and accomplish another task, such as reading or looking around. The decrease in your automatic reflexes complicates motor function, so the less distraction, the better.  Do not wear rubber or gripping soled shoes, they might "catch" on the floor and cause tripping.  Move slowly when changing positions. Use deliberate, concentrated movements and, if needed, use a grab bar or walking aid. Count 15 seconds between each movement. For example, when rising from a seated position, wait 15 seconds after standing to begin walking.  If balance is a continuous problem, you might want to consider a walking aid such as a cane, walking stick, or walker. Once you've mastered walking with help, you might be ready to try it on your own again.

## 2023-09-29 DIAGNOSIS — G894 Chronic pain syndrome: Secondary | ICD-10-CM | POA: Diagnosis not present

## 2023-09-29 DIAGNOSIS — I255 Ischemic cardiomyopathy: Secondary | ICD-10-CM | POA: Diagnosis not present

## 2023-09-29 DIAGNOSIS — I252 Old myocardial infarction: Secondary | ICD-10-CM | POA: Diagnosis not present

## 2023-09-29 DIAGNOSIS — M159 Polyosteoarthritis, unspecified: Secondary | ICD-10-CM | POA: Diagnosis not present

## 2023-09-29 DIAGNOSIS — I5032 Chronic diastolic (congestive) heart failure: Secondary | ICD-10-CM | POA: Diagnosis not present

## 2023-09-29 DIAGNOSIS — Z6841 Body Mass Index (BMI) 40.0 and over, adult: Secondary | ICD-10-CM | POA: Diagnosis not present

## 2023-09-29 DIAGNOSIS — I480 Paroxysmal atrial fibrillation: Secondary | ICD-10-CM | POA: Diagnosis not present

## 2023-09-29 DIAGNOSIS — I110 Hypertensive heart disease with heart failure: Secondary | ICD-10-CM | POA: Diagnosis not present

## 2023-10-04 DIAGNOSIS — Z6841 Body Mass Index (BMI) 40.0 and over, adult: Secondary | ICD-10-CM | POA: Diagnosis not present

## 2023-10-04 DIAGNOSIS — I480 Paroxysmal atrial fibrillation: Secondary | ICD-10-CM | POA: Diagnosis not present

## 2023-10-04 DIAGNOSIS — I255 Ischemic cardiomyopathy: Secondary | ICD-10-CM | POA: Diagnosis not present

## 2023-10-04 DIAGNOSIS — I110 Hypertensive heart disease with heart failure: Secondary | ICD-10-CM | POA: Diagnosis not present

## 2023-10-04 DIAGNOSIS — I252 Old myocardial infarction: Secondary | ICD-10-CM | POA: Diagnosis not present

## 2023-10-04 DIAGNOSIS — G894 Chronic pain syndrome: Secondary | ICD-10-CM | POA: Diagnosis not present

## 2023-10-04 DIAGNOSIS — I5032 Chronic diastolic (congestive) heart failure: Secondary | ICD-10-CM | POA: Diagnosis not present

## 2023-10-04 DIAGNOSIS — M159 Polyosteoarthritis, unspecified: Secondary | ICD-10-CM | POA: Diagnosis not present

## 2023-10-05 ENCOUNTER — Other Ambulatory Visit: Payer: Self-pay | Admitting: Internal Medicine

## 2023-10-06 ENCOUNTER — Encounter (HOSPITAL_COMMUNITY): Payer: Self-pay

## 2023-10-06 ENCOUNTER — Emergency Department (HOSPITAL_COMMUNITY)
Admission: EM | Admit: 2023-10-06 | Discharge: 2023-10-06 | Disposition: A | Discharge status: Home / Self Care | Attending: Student | Admitting: Student

## 2023-10-06 ENCOUNTER — Emergency Department (HOSPITAL_COMMUNITY)

## 2023-10-06 ENCOUNTER — Other Ambulatory Visit: Payer: Self-pay

## 2023-10-06 DIAGNOSIS — Z7982 Long term (current) use of aspirin: Secondary | ICD-10-CM | POA: Insufficient documentation

## 2023-10-06 DIAGNOSIS — J4489 Other specified chronic obstructive pulmonary disease: Secondary | ICD-10-CM | POA: Insufficient documentation

## 2023-10-06 DIAGNOSIS — I5041 Acute combined systolic (congestive) and diastolic (congestive) heart failure: Secondary | ICD-10-CM | POA: Insufficient documentation

## 2023-10-06 DIAGNOSIS — G8929 Other chronic pain: Secondary | ICD-10-CM | POA: Diagnosis not present

## 2023-10-06 DIAGNOSIS — M79643 Pain in unspecified hand: Secondary | ICD-10-CM | POA: Diagnosis not present

## 2023-10-06 DIAGNOSIS — Z7901 Long term (current) use of anticoagulants: Secondary | ICD-10-CM | POA: Insufficient documentation

## 2023-10-06 DIAGNOSIS — I2510 Atherosclerotic heart disease of native coronary artery without angina pectoris: Secondary | ICD-10-CM | POA: Diagnosis not present

## 2023-10-06 DIAGNOSIS — M5020 Other cervical disc displacement, unspecified cervical region: Secondary | ICD-10-CM | POA: Diagnosis not present

## 2023-10-06 DIAGNOSIS — Z7951 Long term (current) use of inhaled steroids: Secondary | ICD-10-CM | POA: Insufficient documentation

## 2023-10-06 DIAGNOSIS — Z7952 Long term (current) use of systemic steroids: Secondary | ICD-10-CM | POA: Insufficient documentation

## 2023-10-06 DIAGNOSIS — M50322 Other cervical disc degeneration at C5-C6 level: Secondary | ICD-10-CM | POA: Diagnosis not present

## 2023-10-06 DIAGNOSIS — M47812 Spondylosis without myelopathy or radiculopathy, cervical region: Secondary | ICD-10-CM | POA: Diagnosis not present

## 2023-10-06 DIAGNOSIS — M542 Cervicalgia: Secondary | ICD-10-CM | POA: Diagnosis present

## 2023-10-06 DIAGNOSIS — M4802 Spinal stenosis, cervical region: Secondary | ICD-10-CM | POA: Insufficient documentation

## 2023-10-06 DIAGNOSIS — G4489 Other headache syndrome: Secondary | ICD-10-CM | POA: Diagnosis not present

## 2023-10-06 MED ORDER — PROCHLORPERAZINE EDISYLATE 10 MG/2ML IJ SOLN
10.0000 mg | Freq: Once | INTRAMUSCULAR | Status: AC
  Administered 2023-10-06: 10 mg via INTRAVENOUS
  Filled 2023-10-06: qty 2

## 2023-10-06 MED ORDER — HYDROCODONE-ACETAMINOPHEN 5-325 MG PO TABS: 2.0000 | ORAL_TABLET | ORAL | 0 refills | Status: DC | PRN

## 2023-10-06 MED ORDER — MORPHINE SULFATE (PF) 4 MG/ML IV SOLN
4.0000 mg | Freq: Once | INTRAVENOUS | Status: AC
  Administered 2023-10-06: 4 mg via INTRAVENOUS
  Filled 2023-10-06: qty 1

## 2023-10-06 MED ORDER — ONDANSETRON HCL 4 MG/2ML IJ SOLN
4.0000 mg | Freq: Once | INTRAMUSCULAR | Status: AC
  Administered 2023-10-06: 4 mg via INTRAVENOUS
  Filled 2023-10-06: qty 2

## 2023-10-06 MED ORDER — DICLOFENAC SODIUM 1 % EX GEL: 4.0000 g | Freq: Four times a day (QID) | CUTANEOUS | 0 refills | Status: DC

## 2023-10-06 MED ORDER — DIPHENHYDRAMINE HCL 50 MG/ML IJ SOLN
25.0000 mg | Freq: Once | INTRAMUSCULAR | Status: AC
  Administered 2023-10-06: 25 mg via INTRAVENOUS
  Filled 2023-10-06: qty 1

## 2023-10-06 MED ORDER — DEXAMETHASONE SODIUM PHOSPHATE 10 MG/ML IJ SOLN
10.0000 mg | Freq: Once | INTRAMUSCULAR | Status: AC
  Administered 2023-10-06: 10 mg via INTRAVENOUS
  Filled 2023-10-06: qty 1

## 2023-10-06 NOTE — ED Provider Notes (Signed)
 [place] EMERGENCY DEPARTMENT AT [facility] Provider Note  CSN: [id number] Arrival date & time: 10/06/23 1215  Chief Complaint(s) Headache, Shoulder Pain, Back Pain, Leg Pain, Leg Swelling, and Neck Pain  HPI Kristine Montgomery is a 58 y.o. female with PMH CHF, CAD, COPD, RA, paroxysmal A-fib on Eliquis  who presents emergency room for evaluation of upper extremity and neck pain with headache.  Patient been seen multiple times in the emergency department since January 2025 for similar complaints and received CT in March 2025 showing spinal stenosis and neuroforaminal stenosis at multiple levels.  States that she has been trying to follow-up outpatient with neurosurgery but has had significant difficulty with outpatient follow-up.  Recently completed a Medrol  Dosepak 2 weeks ago with mild symptomatic improvement but her symptoms have since returned.  States that pain is in the neck and she is having debilitating bilateral upper extremity pain.  Endorses paresthesias.  Denies chest pain, shortness of breath, abdominal pain, nausea, vomiting or other systemic symptoms   Past Medical History Past Medical History:  Diagnosis Date   Acute combined systolic and diastolic CHF, NYHA class 4 (HCC) 07/29/2020   a. EF 30% by echo in 07/2020 b. EF 55-60% by echo in 11/2020   Arthritis    Asthma    CAD (coronary artery disease)    a. 07/2020: s/p NSTEMI with DES to proxLAD and DES to proxLCx with medical management recommended of the occluded RCA.   Chronic bronchitis (HCC)    COPD (chronic obstructive pulmonary disease) (HCC)    Edema extremities    History of hiatal hernia    Hyperlipidemia LDL goal <70 07/29/2020   Ovarian cyst    Pneumonia    Rheumatoid arthritis [facility])    Patient Active Problem List   Diagnosis Date Noted   Bilateral hearing loss 05/26/2023   Seropositive rheumatoid arthritis (HCC) 04/21/2022   High risk medication use 04/21/2022   Idiopathic gout of multiple sites  04/21/2022   Secondary hypercoagulable state (HCC) 10/14/2021   Paroxysmal atrial fibrillation (HCC) 03/23/2021   Hypokalemia 03/23/2021   Leukocytosis 03/23/2021   Thrombocytosis 03/23/2021   Elevated troponin 03/23/2021   Hyperglycemia 03/23/2021   Ischemic cardiomyopathy 07/31/2020   Coronary artery disease involving native coronary artery of native heart with unstable angina pectoris (HCC) 07/31/2020   Nocturnal hypoxemia due to obesity 07/31/2020   Morbid obesity (HCC) 07/31/2020   Tobacco abuse 07/31/2020   Acute combined systolic and diastolic CHF, NYHA class 4 (HCC) 07/29/2020   Mixed hyperlipidemia 07/29/2020   NSTEMI (non-ST elevated myocardial infarction) (HCC) 07/29/2020   Unstable angina (HCC) 07/28/2020   S/P right knee arthroscopy 05/23/20 05/28/2020   Tear of meniscus of knee joint    Synovitis of knee    Primary osteoarthritis of right knee    Encounter for screening fecal occult blood testing 04/09/2020   Encounter for gynecological examination with Papanicolaou smear of cervix 04/09/2020   Urinary tract infection without hematuria 02/27/2020   Dysuria 02/27/2020   Urge incontinence 04/10/2018   OAB (overactive bladder) 04/10/2018   Urinary frequency 04/10/2018   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 Authorizing Provider  aspirin  EC 81 MG tablet Take 81 mg by mouth daily. Swallow whole.    [provider]  atorvastatin  (LIPITOR ) 80 MG tablet Take 1 tablet (80 mg total) by mouth  daily. 08/11/23   Elmyra Haggard, MD  dapagliflozin  propanediol (FARXIGA ) 10 MG TABS tablet Take 1 tablet (10 mg total) by mouth daily. 05/09/23   Elmyra Haggard, MD  dronedarone  (MULTAQ ) 400 MG tablet Take 1 tablet (400 mg total) by mouth 2 (two) times daily with a meal. 06/14/23   Elmyra Haggard, MD  ELIQUIS  5 MG TABS tablet TAKE  ONE TABLET BY MOUTH 2 TIMES A DAY 07/06/23   Elmyra Haggard, MD  famotidine  (PEPCID ) 40 MG tablet TAKE ONE TABLET BY MOUTH IN THE EVENING. 11/24/22   Elmyra Haggard, MD  gabapentin  (NEURONTIN ) 100 MG capsule Take 1 capsule (100 mg total) by mouth 2 (two) times daily. 07/26/23   Ellene Gustin, MD  HYDROcodone -acetaminophen  (NORCO) 10-325 MG tablet Take 1 tablet by mouth every 6 (six) hours as needed for moderate pain (pain score 4-6) or severe pain (pain score 7-10).    [provider]  lidocaine  (LIDODERM ) 5 % Place 1 patch onto the skin daily. Remove & Discard patch within 12 hours or as directed by MD.  Apply to your right shoulder at site of maximum pain 07/16/23   Idol, Concha Deed, PA-C  metoprolol  succinate (TOPROL -XL) 25 MG 24 hr tablet Take 0.5 tablets (12.5 mg total) by mouth daily. 08/11/23   Elmyra Haggard, MD  nitroGLYCERIN  (NITROSTAT ) 0.4 MG SL tablet PLACE 1 TABLET (0.4 MG TOTAL) UNDER THE TONGUE EVERY FIVE MINUTES X 3 DOSES AS NEEDED FOR CHEST PAIN. Patient taking differently: Place 0.4 mg under the tongue every 5 (five) minutes as needed for chest pain. 07/31/20 09/28/23  Narvis Bad, PA  predniSONE  (DELTASONE ) 10 MG tablet 6, 5, 4, 3, 2 then 1 tablet by mouth daily for 6 days total. 07/16/23   Idol, Julie, PA-C  sacubitril -valsartan  (ENTRESTO ) 24-26 MG Take 1 tablet by mouth 2 (two) times daily. 10/05/23   Elmyra Haggard, MD  torsemide  (DEMADEX ) 20 MG tablet Take 1 tablet (20 mg total) by mouth daily. 03/29/23   Tammie Fall, MD                                                                                                                                    Past Surgical History Past Surgical History:  Procedure Laterality Date   CESAREAN SECTION     CORONARY STENT INTERVENTION N/A 07/29/2020   Procedure: CORONARY STENT INTERVENTION;  Surgeon: Millicent Ally, MD;  Location: [facility];  Service: Cardiovascular;  Laterality: N/A;   KNEE ARTHROSCOPY WITH MEDIAL MENISECTOMY  Right 05/23/2020   Procedure: KNEE ARTHROSCOPY WITH MEDIAL AND LATERAL MENISECTOMY; 3 COMPARTMENT SYNOVECTOMY;  Surgeon: Darrin Emerald, MD;  Location: [facility];  Service: Orthopedics;  Laterality: Right;   RIGHT/LEFT HEART CATH AND CORONARY ANGIOGRAPHY N/A 07/29/2020   Procedure: RIGHT/LEFT HEART CATH AND CORONARY ANGIOGRAPHY;  Surgeon: Millicent Ally, MD;  Location: [facility] INVASIVE CV [facility];  Service: Cardiovascular;  Laterality: N/A;   TONSILLECTOMY     TUBAL LIGATION     Family History Family History  Problem Relation Age of Onset   Cancer Paternal Grandfather        colon   Cancer Maternal Grandmother        lung   COPD Father    Non-Hodgkin's lymphoma Mother    Diabetes Mother    Cancer Mother        cervical   Congestive Heart Failure Mother    Hypertension Sister    Other Son        fluid around heart    Social History Social History   Tobacco Use   Smoking status: Former    Current packs/day: 0.00    Average packs/day: 0.5 packs/day for 30.0 years (15.0 ttl pk-yrs)    Types: Cigarettes    Start date: 07/1990    Quit date: 07/2020    Years since quitting: 3.2    Passive exposure: Past   Smokeless tobacco: Never  Vaping Use   Vaping status: Never Used  Substance Use Topics   Alcohol use: Not Currently   Drug use: No   Allergies Patient has no known allergies.  Review of Systems Review of Systems  Neurological:  Positive for numbness and headaches.    Physical Exam Vital Signs  I have reviewed the triage vital signs BP (!) 106/58 (BP Location: Left Arm)   Pulse 72   Temp 97.8 F (36.6 C) (Oral)   Resp 20   Ht 5\' 1"  (1.549 m)   Wt 125.2 kg   LMP 06/27/2017 (Within Weeks)   SpO2 99%   BMI 52.15 kg/m   Physical Exam Vitals and nursing note reviewed.  Constitutional:      General: She is not in acute distress.    Appearance: She is well-developed.  HENT:     Head: Normocephalic and atraumatic.  Eyes:     Conjunctiva/sclera: Conjunctivae normal.   Cardiovascular:     Rate and Rhythm: Normal rate and regular rhythm.     Heart sounds: No murmur heard. Pulmonary:     Effort: Pulmonary effort is normal. No respiratory distress.     Breath sounds: Normal breath sounds.  Abdominal:     Palpations: Abdomen is soft.     Tenderness: There is no abdominal tenderness.  Musculoskeletal:        General: No swelling.     Cervical back: Neck supple.  Skin:    General: Skin is warm and dry.     Capillary Refill: Capillary refill takes less than 2 seconds.  Neurological:     Mental Status: She is alert.     Sensory: Sensory deficit present.  Psychiatric:        Mood and Affect: Mood normal.     ED Results and Treatments Labs (all labs ordered are listed, but only abnormal results are displayed) Labs Reviewed - No data to display  Radiology No results found.  Pertinent labs & imaging results that were available during my care of the patient were reviewed by me and considered in my medical decision making (see MDM for details).  Medications Ordered in ED Medications  prochlorperazine (COMPAZINE) injection 10 mg (10 mg Intravenous Given 10/06/23 1337)  diphenhydrAMINE (BENADRYL) injection 25 mg (25 mg Intravenous Given 10/06/23 1337)  dexamethasone  (DECADRON ) injection 10 mg (10 mg Intravenous Given 10/06/23 1337)                                                                                                                                     Procedures Procedures  (including critical care time)  Medical Decision Making / ED Course   This patient presents to the ED for concern of arm pain, neck pain, this involves an extensive number of treatment options, and is a complaint that carries with it a high risk of complications and morbidity.  The differential diagnosis includes cervical radiculopathy, cervical stenosis,  neuropathy, musculoskeletal pain  MDM: Patient seen emerged part for evaluation of arm and neck pain with a headache.  Physical exam with some mild tenderness in the C-spine, subjective sensory deficits in the C6 distribution but no significant weakness.  With previous CT showing cervical stenosis and symptoms reportedly worsening, an MRI of the C-spine was obtained that shows severe spinal stenosis at C5-C6 with mild cord signal abnormality, moderate to severe spinal stenosis at C4-5 and C6-7.  Spoke with the neurosurgeon on-call Dr. Michale Age who will evaluate the patient in his office given otherwise reassuring neurologic exam here in the emergency room.  She is already used Medrol  Dosepak twice and likely would not benefit from a third at this time and thus we will defer additional steroid therapy.  At this time she does not meet inpatient criteria for admission but strict return precautions were given which she voiced understanding.  Patient will call [place] [facility] for close follow-up..   Additional history obtained:  -External records from outside source obtained and reviewed including: Chart review including previous notes, labs, imaging, consultation notes Imaging Studies ordered: I ordered imaging studies including MRI C-spine I independently visualized and interpreted imaging. I agree with the radiologist interpretation   Medicines ordered and prescription drug management: Meds ordered this encounter  Medications   prochlorperazine (COMPAZINE) injection 10 mg   diphenhydrAMINE (BENADRYL) injection 25 mg   dexamethasone  (DECADRON ) injection 10 mg    -I have reviewed the patients home medicines and have made adjustments as needed  Critical interventions none  Consultations Obtained: I requested consultation with the neurosurgeon on-call Dr. Michale Age,  and discussed lab and imaging findings as well as pertinent plan - they recommend: Close outpatient follow-up   Cardiac  Monitoring: The patient was maintained on a cardiac monitor.  I personally viewed and interpreted the cardiac monitored which showed an underlying rhythm of: NSR  Social Determinants of Health:  Factors  impacting patients care include: none   Reevaluation: After the interventions noted above, I reevaluated the patient and found that they have :improved  Co morbidities that complicate the patient evaluation  Past Medical History:  Diagnosis Date   Acute combined systolic and diastolic CHF, NYHA class 4 (HCC) 07/29/2020   a. EF 30% by echo in 07/2020 b. EF 55-60% by echo in 11/2020   Arthritis    Asthma    CAD (coronary artery disease)    a. 07/2020: s/p NSTEMI with DES to proxLAD and DES to proxLCx with medical management recommended of the occluded RCA.   Chronic bronchitis (HCC)    COPD (chronic obstructive pulmonary disease) (HCC)    Edema extremities    History of hiatal hernia    Hyperlipidemia LDL goal <70 07/29/2020   Ovarian cyst    Pneumonia    Rheumatoid arthritis (HCC)       Dispostion: I considered admission for this patient, but at this time she does not meet inpatient criteria for admission and will be discharged with outpatient follow-up     Final Clinical Impression(s) / ED Diagnoses Final diagnoses:  None     @PCDICTATION @    Karlyn Overman, MD 10/06/23 [phone]

## 2023-10-06 NOTE — ED Triage Notes (Signed)
 Per EMS  Headache Started this morning Shoulder pain 2 days ago Neck pain Started last week Back pain Started last week Given steroid with little relief Leg pain Started last week Redness in legs ongoing

## 2023-10-11 DIAGNOSIS — I110 Hypertensive heart disease with heart failure: Secondary | ICD-10-CM | POA: Diagnosis not present

## 2023-10-11 DIAGNOSIS — M159 Polyosteoarthritis, unspecified: Secondary | ICD-10-CM | POA: Diagnosis not present

## 2023-10-11 DIAGNOSIS — Z6841 Body Mass Index (BMI) 40.0 and over, adult: Secondary | ICD-10-CM | POA: Diagnosis not present

## 2023-10-11 DIAGNOSIS — I255 Ischemic cardiomyopathy: Secondary | ICD-10-CM | POA: Diagnosis not present

## 2023-10-11 DIAGNOSIS — I480 Paroxysmal atrial fibrillation: Secondary | ICD-10-CM | POA: Diagnosis not present

## 2023-10-11 DIAGNOSIS — G894 Chronic pain syndrome: Secondary | ICD-10-CM | POA: Diagnosis not present

## 2023-10-11 DIAGNOSIS — I252 Old myocardial infarction: Secondary | ICD-10-CM | POA: Diagnosis not present

## 2023-10-11 DIAGNOSIS — I5032 Chronic diastolic (congestive) heart failure: Secondary | ICD-10-CM | POA: Diagnosis not present

## 2023-10-17 DIAGNOSIS — M19079 Primary osteoarthritis, unspecified ankle and foot: Secondary | ICD-10-CM | POA: Diagnosis not present

## 2023-10-17 DIAGNOSIS — M47816 Spondylosis without myelopathy or radiculopathy, lumbar region: Secondary | ICD-10-CM | POA: Diagnosis not present

## 2023-10-17 DIAGNOSIS — I5032 Chronic diastolic (congestive) heart failure: Secondary | ICD-10-CM | POA: Diagnosis not present

## 2023-10-17 DIAGNOSIS — I480 Paroxysmal atrial fibrillation: Secondary | ICD-10-CM | POA: Diagnosis not present

## 2023-10-17 DIAGNOSIS — I110 Hypertensive heart disease with heart failure: Secondary | ICD-10-CM | POA: Diagnosis not present

## 2023-10-17 DIAGNOSIS — M5412 Radiculopathy, cervical region: Secondary | ICD-10-CM | POA: Diagnosis not present

## 2023-10-17 DIAGNOSIS — G894 Chronic pain syndrome: Secondary | ICD-10-CM | POA: Diagnosis not present

## 2023-10-19 DIAGNOSIS — I5032 Chronic diastolic (congestive) heart failure: Secondary | ICD-10-CM | POA: Diagnosis not present

## 2023-10-19 DIAGNOSIS — I480 Paroxysmal atrial fibrillation: Secondary | ICD-10-CM | POA: Diagnosis not present

## 2023-10-19 DIAGNOSIS — Z6841 Body Mass Index (BMI) 40.0 and over, adult: Secondary | ICD-10-CM | POA: Diagnosis not present

## 2023-10-19 DIAGNOSIS — I255 Ischemic cardiomyopathy: Secondary | ICD-10-CM | POA: Diagnosis not present

## 2023-10-19 DIAGNOSIS — M159 Polyosteoarthritis, unspecified: Secondary | ICD-10-CM | POA: Diagnosis not present

## 2023-10-19 DIAGNOSIS — I110 Hypertensive heart disease with heart failure: Secondary | ICD-10-CM | POA: Diagnosis not present

## 2023-10-19 DIAGNOSIS — I252 Old myocardial infarction: Secondary | ICD-10-CM | POA: Diagnosis not present

## 2023-10-19 DIAGNOSIS — G894 Chronic pain syndrome: Secondary | ICD-10-CM | POA: Diagnosis not present

## 2023-10-25 DIAGNOSIS — I255 Ischemic cardiomyopathy: Secondary | ICD-10-CM | POA: Diagnosis not present

## 2023-10-25 DIAGNOSIS — I252 Old myocardial infarction: Secondary | ICD-10-CM | POA: Diagnosis not present

## 2023-10-25 DIAGNOSIS — I110 Hypertensive heart disease with heart failure: Secondary | ICD-10-CM | POA: Diagnosis not present

## 2023-10-25 DIAGNOSIS — I480 Paroxysmal atrial fibrillation: Secondary | ICD-10-CM | POA: Diagnosis not present

## 2023-10-25 DIAGNOSIS — G894 Chronic pain syndrome: Secondary | ICD-10-CM | POA: Diagnosis not present

## 2023-10-25 DIAGNOSIS — I5032 Chronic diastolic (congestive) heart failure: Secondary | ICD-10-CM | POA: Diagnosis not present

## 2023-10-25 DIAGNOSIS — Z6841 Body Mass Index (BMI) 40.0 and over, adult: Secondary | ICD-10-CM | POA: Diagnosis not present

## 2023-10-25 DIAGNOSIS — M159 Polyosteoarthritis, unspecified: Secondary | ICD-10-CM | POA: Diagnosis not present

## 2023-10-27 DIAGNOSIS — M4802 Spinal stenosis, cervical region: Secondary | ICD-10-CM | POA: Diagnosis not present

## 2023-10-27 DIAGNOSIS — Z6841 Body Mass Index (BMI) 40.0 and over, adult: Secondary | ICD-10-CM | POA: Diagnosis not present

## 2023-10-27 DIAGNOSIS — M4722 Other spondylosis with radiculopathy, cervical region: Secondary | ICD-10-CM | POA: Diagnosis not present

## 2023-11-02 ENCOUNTER — Emergency Department (HOSPITAL_COMMUNITY)
Admission: EM | Admit: 2023-11-02 | Discharge: 2023-11-02 | Disposition: A | Discharge status: Home / Self Care | Source: Home / Self Care | Attending: Emergency Medicine | Admitting: Emergency Medicine

## 2023-11-02 ENCOUNTER — Encounter (HOSPITAL_COMMUNITY): Payer: Self-pay

## 2023-11-02 ENCOUNTER — Other Ambulatory Visit: Payer: Self-pay

## 2023-11-02 ENCOUNTER — Emergency Department (HOSPITAL_COMMUNITY)

## 2023-11-02 DIAGNOSIS — Z87891 Personal history of nicotine dependence: Secondary | ICD-10-CM | POA: Diagnosis not present

## 2023-11-02 DIAGNOSIS — I255 Ischemic cardiomyopathy: Secondary | ICD-10-CM | POA: Diagnosis not present

## 2023-11-02 DIAGNOSIS — I959 Hypotension, unspecified: Secondary | ICD-10-CM | POA: Diagnosis not present

## 2023-11-02 DIAGNOSIS — E66813 Obesity, class 3: Secondary | ICD-10-CM | POA: Diagnosis not present

## 2023-11-02 DIAGNOSIS — X501XXA Overexertion from prolonged static or awkward postures, initial encounter: Secondary | ICD-10-CM | POA: Insufficient documentation

## 2023-11-02 DIAGNOSIS — L03115 Cellulitis of right lower limb: Secondary | ICD-10-CM | POA: Diagnosis not present

## 2023-11-02 DIAGNOSIS — R208 Other disturbances of skin sensation: Secondary | ICD-10-CM

## 2023-11-02 DIAGNOSIS — R609 Edema, unspecified: Secondary | ICD-10-CM | POA: Diagnosis not present

## 2023-11-02 DIAGNOSIS — R531 Weakness: Secondary | ICD-10-CM | POA: Diagnosis not present

## 2023-11-02 DIAGNOSIS — Z7901 Long term (current) use of anticoagulants: Secondary | ICD-10-CM | POA: Insufficient documentation

## 2023-11-02 DIAGNOSIS — Z7982 Long term (current) use of aspirin: Secondary | ICD-10-CM | POA: Diagnosis not present

## 2023-11-02 DIAGNOSIS — R209 Unspecified disturbances of skin sensation: Secondary | ICD-10-CM

## 2023-11-02 DIAGNOSIS — Z833 Family history of diabetes mellitus: Secondary | ICD-10-CM | POA: Diagnosis not present

## 2023-11-02 DIAGNOSIS — I2510 Atherosclerotic heart disease of native coronary artery without angina pectoris: Secondary | ICD-10-CM | POA: Insufficient documentation

## 2023-11-02 DIAGNOSIS — I25110 Atherosclerotic heart disease of native coronary artery with unstable angina pectoris: Secondary | ICD-10-CM | POA: Diagnosis not present

## 2023-11-02 DIAGNOSIS — R600 Localized edema: Secondary | ICD-10-CM | POA: Insufficient documentation

## 2023-11-02 DIAGNOSIS — M1990 Unspecified osteoarthritis, unspecified site: Secondary | ICD-10-CM | POA: Diagnosis present

## 2023-11-02 DIAGNOSIS — Z8249 Family history of ischemic heart disease and other diseases of the circulatory system: Secondary | ICD-10-CM | POA: Diagnosis not present

## 2023-11-02 DIAGNOSIS — I5042 Chronic combined systolic (congestive) and diastolic (congestive) heart failure: Secondary | ICD-10-CM | POA: Insufficient documentation

## 2023-11-02 DIAGNOSIS — Z79899 Other long term (current) drug therapy: Secondary | ICD-10-CM | POA: Diagnosis not present

## 2023-11-02 DIAGNOSIS — I872 Venous insufficiency (chronic) (peripheral): Secondary | ICD-10-CM | POA: Diagnosis present

## 2023-11-02 DIAGNOSIS — I252 Old myocardial infarction: Secondary | ICD-10-CM | POA: Diagnosis not present

## 2023-11-02 DIAGNOSIS — H9193 Unspecified hearing loss, bilateral: Secondary | ICD-10-CM | POA: Diagnosis not present

## 2023-11-02 DIAGNOSIS — G629 Polyneuropathy, unspecified: Secondary | ICD-10-CM | POA: Diagnosis present

## 2023-11-02 DIAGNOSIS — L03119 Cellulitis of unspecified part of limb: Secondary | ICD-10-CM | POA: Diagnosis not present

## 2023-11-02 DIAGNOSIS — Z6841 Body Mass Index (BMI) 40.0 and over, adult: Secondary | ICD-10-CM | POA: Diagnosis not present

## 2023-11-02 DIAGNOSIS — J4489 Other specified chronic obstructive pulmonary disease: Secondary | ICD-10-CM | POA: Diagnosis present

## 2023-11-02 DIAGNOSIS — I10 Essential (primary) hypertension: Secondary | ICD-10-CM | POA: Diagnosis not present

## 2023-11-02 DIAGNOSIS — S86911A Strain of unspecified muscle(s) and tendon(s) at lower leg level, right leg, initial encounter: Secondary | ICD-10-CM | POA: Diagnosis not present

## 2023-11-02 DIAGNOSIS — M7989 Other specified soft tissue disorders: Secondary | ICD-10-CM | POA: Diagnosis not present

## 2023-11-02 DIAGNOSIS — J449 Chronic obstructive pulmonary disease, unspecified: Secondary | ICD-10-CM | POA: Insufficient documentation

## 2023-11-02 DIAGNOSIS — R0902 Hypoxemia: Secondary | ICD-10-CM | POA: Diagnosis present

## 2023-11-02 DIAGNOSIS — M069 Rheumatoid arthritis, unspecified: Secondary | ICD-10-CM | POA: Diagnosis present

## 2023-11-02 DIAGNOSIS — E876 Hypokalemia: Secondary | ICD-10-CM | POA: Diagnosis not present

## 2023-11-02 DIAGNOSIS — M059 Rheumatoid arthritis with rheumatoid factor, unspecified: Secondary | ICD-10-CM | POA: Diagnosis not present

## 2023-11-02 DIAGNOSIS — M79661 Pain in right lower leg: Secondary | ICD-10-CM | POA: Diagnosis not present

## 2023-11-02 DIAGNOSIS — E785 Hyperlipidemia, unspecified: Secondary | ICD-10-CM | POA: Diagnosis present

## 2023-11-02 DIAGNOSIS — I480 Paroxysmal atrial fibrillation: Secondary | ICD-10-CM | POA: Diagnosis not present

## 2023-11-02 DIAGNOSIS — R079 Chest pain, unspecified: Secondary | ICD-10-CM | POA: Diagnosis not present

## 2023-11-02 DIAGNOSIS — L0390 Cellulitis, unspecified: Secondary | ICD-10-CM | POA: Diagnosis not present

## 2023-11-02 DIAGNOSIS — L03116 Cellulitis of left lower limb: Secondary | ICD-10-CM | POA: Diagnosis present

## 2023-11-02 HISTORY — DX: Unspecified atrial fibrillation: I48.91

## 2023-11-02 LAB — CBC WITH DIFFERENTIAL/PLATELET
Abs Immature Granulocytes: 0.02 10*3/uL (ref 0.00–0.07)
Basophils Absolute: 0 10*3/uL (ref 0.0–0.1)
Basophils Relative: 0 %
Eosinophils Absolute: 0.2 10*3/uL (ref 0.0–0.5)
Eosinophils Relative: 2 %
HCT: 31.1 % — ABNORMAL LOW (ref 36.0–46.0)
Hemoglobin: 9.4 g/dL — ABNORMAL LOW (ref 12.0–15.0)
Immature Granulocytes: 0 %
Lymphocytes Relative: 20 %
Lymphs Abs: 1.8 10*3/uL (ref 0.7–4.0)
MCH: 25.5 pg — ABNORMAL LOW (ref 26.0–34.0)
MCHC: 30.2 g/dL (ref 30.0–36.0)
MCV: 84.5 fL (ref 80.0–100.0)
Monocytes Absolute: 0.9 10*3/uL (ref 0.1–1.0)
Monocytes Relative: 10 %
Neutro Abs: 6 10*3/uL (ref 1.7–7.7)
Neutrophils Relative %: 68 %
Platelets: 245 10*3/uL (ref 150–400)
RBC: 3.68 MIL/uL — ABNORMAL LOW (ref 3.87–5.11)
RDW: 17.4 % — ABNORMAL HIGH (ref 11.5–15.5)
WBC: 8.9 10*3/uL (ref 4.0–10.5)
nRBC: 0 % (ref 0.0–0.2)

## 2023-11-02 LAB — COMPREHENSIVE METABOLIC PANEL WITH GFR
ALT: 15 U/L (ref 0–44)
AST: 16 U/L (ref 15–41)
Albumin: 2.6 g/dL — ABNORMAL LOW (ref 3.5–5.0)
Alkaline Phosphatase: 78 U/L (ref 38–126)
Anion gap: 11 (ref 5–15)
BUN: 15 mg/dL (ref 6–20)
CO2: 24 mmol/L (ref 22–32)
Calcium: 8.7 mg/dL — ABNORMAL LOW (ref 8.9–10.3)
Chloride: 106 mmol/L (ref 98–111)
Creatinine, Ser: 0.78 mg/dL (ref 0.44–1.00)
GFR, Estimated: >60 mL/min (ref >60)
Glucose, Bld: 100 mg/dL — ABNORMAL HIGH (ref 70–99)
Potassium: 3.8 mmol/L (ref 3.5–5.1)
Sodium: 141 mmol/L (ref 135–145)
Total Bilirubin: 0.7 mg/dL (ref 0.0–1.2)
Total Protein: 6.8 g/dL (ref 6.5–8.1)

## 2023-11-02 LAB — BRAIN NATRIURETIC PEPTIDE: B Natriuretic Peptide: 49 pg/mL (ref 0.0–100.0)

## 2023-11-02 LAB — LACTIC ACID, PLASMA: Lactic Acid, Venous: 1.5 mmol/L (ref 0.5–1.9)

## 2023-11-02 MED ORDER — OXYCODONE HCL 10 MG PO TABS: 10.0000 mg | ORAL_TABLET | Freq: Four times a day (QID) | ORAL | 0 refills | Status: DC

## 2023-11-02 MED ORDER — HYDROCODONE-ACETAMINOPHEN 5-325 MG PO TABS
1.0000 | ORAL_TABLET | Freq: Once | ORAL | Status: DC
  Filled 2023-11-02: qty 1

## 2023-11-02 MED ORDER — OXYCODONE-ACETAMINOPHEN 5-325 MG PO TABS
2.0000 | ORAL_TABLET | Freq: Once | ORAL | Status: AC
  Administered 2023-11-02: 2 via ORAL
  Filled 2023-11-02: qty 2

## 2023-11-02 MED ORDER — GABAPENTIN 100 MG PO CAPS: 200.0000 mg | ORAL_CAPSULE | Freq: Two times a day (BID) | ORAL | 2 refills | Status: DC

## 2023-11-02 MED ORDER — SODIUM CHLORIDE 0.9 % IV BOLUS
500.0000 mL | Freq: Once | INTRAVENOUS | Status: AC
  Administered 2023-11-02: 500 mL via INTRAVENOUS

## 2023-11-02 NOTE — ED Triage Notes (Signed)
 Pt arrived via [facility] from home c/o bilateral lower extremity swelling and reports pain on her lateral right knee and reports feeling a bump there that is painful. Pt denies injury.

## 2023-11-02 NOTE — ED Notes (Signed)
 Pt provided discharge instructions and prescription information. Pt was given the opportunity to ask questions and questions were answered.

## 2023-11-02 NOTE — Discharge Instructions (Addendum)
 Plan to follow up with outpatient physicial therapy/rehab as recommended for treatment and better control of the swelling in your legs.  They should call you for an appointment - if you have not heard from them within the net 2 days, call them to arrange your first appointment.   I have increased your pain medication to at stronger medicine called oxycodone , use this for the next 5 days in place of your hydrocodone .  I also recommend increasing your gabapentin  taking 200 mg twice daily for better pain relief in your legs.

## 2023-11-02 NOTE — ED Provider Notes (Signed)
 [place] EMERGENCY DEPARTMENT AT [facility] Provider Note   CSN: [id number] Arrival date & time: 11/02/23  1323     Patient presents with: Leg Pain (edema)   Kristine Montgomery is a 58 y.o. female with history of CHF, afib on Eliquis , copd, combined CHF on furosemide  and demedex presenting with increased peripheral edema and localizing pain at her right lateral knee.  She describes planting her foot and twisting several days ago when transferring to her wheelchair and felt a popping sensation in her lateral lower leg.  Since this event she has had worsening pain at her lateral out knee joint and feels a knot at this site.  Past hx also includes CAD, pneumonia.  She endorses sob but baseline today.     The history is provided by the patient.       Prior to Admission medications   Medication Sig Start Date End Date Taking? Authorizing Provider  aspirin  EC 81 MG tablet Take 81 mg by mouth daily. Swallow whole.   Yes [provider]  atorvastatin  (LIPITOR ) 80 MG tablet Take 1 tablet (80 mg total) by mouth daily. 08/11/23  Yes Elmyra Haggard, MD  dapagliflozin  propanediol (FARXIGA ) 10 MG TABS tablet Take 1 tablet (10 mg total) by mouth daily. 05/09/23  Yes Elmyra Haggard, MD  dronedarone  (MULTAQ ) 400 MG tablet Take 1 tablet (400 mg total) by mouth 2 (two) times daily with a meal. 06/14/23  Yes Elmyra Haggard, MD  ELIQUIS  5 MG TABS tablet TAKE ONE TABLET BY MOUTH 2 TIMES A DAY 07/06/23  Yes Elmyra Haggard, MD  famotidine  (PEPCID ) 40 MG tablet TAKE ONE TABLET BY MOUTH IN THE EVENING. 11/24/22  Yes Elmyra Haggard, MD  furosemide  (LASIX ) 20 MG tablet Take 20 mg by mouth daily. 10/06/23  Yes [provider]  gabapentin  (NEURONTIN ) 100 MG capsule Take 1 capsule (100 mg total) by mouth 2 (two) times daily. 07/26/23  Yes Ellene Gustin, MD  HYDROcodone -acetaminophen  (NORCO) 10-325 MG tablet Take 1 tablet by mouth every 6 (six) hours as needed for moderate pain (pain score 4-6).    Yes [provider]  metoprolol  succinate (TOPROL -XL) 25 MG 24 hr tablet Take 0.5 tablets (12.5 mg total) by mouth daily. 08/11/23  Yes Elmyra Haggard, MD  nitroGLYCERIN  (NITROSTAT ) 0.4 MG SL tablet Place 0.4 mg under the tongue every 5 (five) minutes as needed for chest pain.   Yes [provider]  sacubitril -valsartan  (ENTRESTO ) 24-26 MG Take 1 tablet by mouth 2 (two) times daily. 10/05/23  Yes Elmyra Haggard, MD    Allergies: Patient has no known allergies.    Review of Systems  Constitutional:  Negative for chills and fever.  HENT:  Negative for congestion and sore throat.   Eyes: Negative.   Respiratory:  Positive for shortness of breath. Negative for chest tightness.   Cardiovascular:  Positive for leg swelling. Negative for chest pain.  Gastrointestinal:  Negative for abdominal pain and nausea.  Genitourinary: Negative.   Musculoskeletal:  Positive for arthralgias. Negative for joint swelling and neck pain.  Skin: Negative.  Negative for rash and wound.  Neurological:  Negative for dizziness, weakness, light-headedness, numbness and headaches.  Psychiatric/Behavioral: Negative.    All other systems reviewed and are negative.   Updated Vital Signs BP (!) 119/55   Pulse 73   Temp (!) 97.5 F (36.4 C) (Temporal)   Resp 13   Ht 5' 1 (1.549 m)  Wt 125.2 kg   LMP 06/27/2017 (Within Weeks)   SpO2 94%   BMI 52.15 kg/m   Physical Exam Vitals and nursing note reviewed.  Constitutional:      Appearance: She is well-developed.  HENT:     Head: Normocephalic and atraumatic.   Eyes:     Conjunctiva/sclera: Conjunctivae normal.    Cardiovascular:     Rate and Rhythm: Normal rate and regular rhythm.     Heart sounds: Normal heart sounds.  Pulmonary:     Effort: Pulmonary effort is normal.     Breath sounds: Normal breath sounds. No wheezing.  Abdominal:     General: Bowel sounds are normal.     Palpations: Abdomen is soft.     Tenderness: There is no  abdominal tenderness.   Musculoskeletal:        General: Normal range of motion.     Cervical back: Normal range of motion.     Right lower leg: Edema present.     Left lower leg: Edema present.     Comments: Bilateral lower extremity pitting edema, no increased warmth,  mild bilateral anterior erythema.    Skin:    General: Skin is warm and dry.   Neurological:     Mental Status: She is alert.     (all labs ordered are listed, but only abnormal results are displayed) Labs Reviewed  CBC WITH DIFFERENTIAL/PLATELET - Abnormal; Notable for the following components:      Result Value   RBC 3.68 (*)    Hemoglobin 9.4 (*)    HCT 31.1 (*)    MCH 25.5 (*)    RDW 17.4 (*)    All other components within normal limits  COMPREHENSIVE METABOLIC PANEL WITH GFR - Abnormal; Notable for the following components:   Glucose, Bld 100 (*)    Calcium  8.7 (*)    Albumin 2.6 (*)    All other components within normal limits  CULTURE, BLOOD (ROUTINE X 2)  CULTURE, BLOOD (ROUTINE X 2)  LACTIC ACID, PLASMA  BRAIN NATRIURETIC PEPTIDE    EKG: None  Radiology: DG Chest Portable 1 View Result Date: 11/02/2023 CLINICAL DATA:  Peripheral edema, pain EXAM: PORTABLE CHEST 1 VIEW COMPARISON:  02/28/2023 FINDINGS: Single frontal view of the chest demonstrates a stable enlarged cardiac silhouette. No airspace disease, effusion, or pneumothorax. No acute bony abnormalities. IMPRESSION: 1. Stable enlarged cardiac silhouette. 2. No acute airspace disease. Electronically Signed   By: Bobbye Burrow M.D.   On: 11/02/2023 16:57   DG Tibia/Fibula Right Result Date: 11/02/2023 CLINICAL DATA:  Lower extremity edema, pain EXAM: RIGHT TIBIA AND FIBULA - 2 VIEW COMPARISON:  03/15/2023 FINDINGS: Frontal and lateral views of the right tibia and fibula are obtained. The dorsal soft tissues are excluded on the lateral views by collimation. There are no acute or destructive bony abnormalities. Alignment of the knee and ankle  is anatomic. Stable osteoarthritis. There is diffuse subcutaneous edema throughout the entirety of the right lower leg, greatest distally and at the ankle. No subcutaneous gas or radiopaque foreign body. IMPRESSION: 1. Diffuse soft tissue swelling. 2. No acute or destructive bony abnormality. Electronically Signed   By: Bobbye Burrow M.D.   On: 11/02/2023 16:56     Procedures   Medications Ordered in the ED  sodium chloride  0.9 % bolus 500 mL (0 mLs Intravenous Stopped 11/02/23 1813)  Medical Decision Making Pt presenting with increasing bilateral lower extremity edema with hx of chf,  but endorses sob is baseline. Also with right lateral knee pain after plant and twist injury, pain localized to proximal fibula, concern for prox fib fx, but imaging negative.  While here she had several very soft bp's, 79/36,  improved to 106/65 after IV fluids.  Denies fevers.  Blood cx obtained,  initial lactic acid negative. No sx to suggest infection/sepsis.   Has been compliant with her furosemide ,  not currently on demedex despite medicine list - she states that was temp med given by pcp.  She does have a very low albumin, denies anorexia,  no known liver or renal disease.  Asked Dr. Osborne Blazer to see pt for admission given low bp's and worsened lower extremity edema.  Please refer to his consult note.  He endorses her sx are generally chronic and has been referred to venous stasis clinic in the past but has not been compliant due difficulty with transportation (was referred to a clinic in [place]).  Will refer to PT in [place] for evaluation and tx of edema.     Amount and/or Complexity of Data Reviewed Labs: ordered.    Details: Sig labs including albumin of 2.6, creatinine normal at 0.78, hgb 9.4, initial lactic acid 1.5,  bnp normal at 49. Radiology: ordered and independent interpretation performed.    Details: No fibula fx.  Chest xray stable cardiomegaly. Agree with  reads ECG/medicine tests: ordered.    Details: Nsr, rate 71 Discussion of management or test interpretation with external provider(s): Discussed with Dr Osborne Blazer who consulted pt.  Recommends outpt f/u.   Risk Prescription drug management. Decision regarding hospitalization.        Final diagnoses:  Peripheral edema  Knee strain, right, initial encounter    ED Discharge Orders          Ordered    Ambulatory referral to Physical Therapy       Comments: Lower ext edema due to veno stasis / lymphedema   11/02/23 1815               Katherine Pancake, PA-C 11/02/23 1826    Cheyenne Cotta, MD 11/04/23 1056

## 2023-11-02 NOTE — Consult Note (Signed)
 Patient Demographics  Kristine Montgomery, is a 58 y.o. female   MRN: [id number]   DOB - [birth year]  Admit Date - 11/02/2023    Outpatient Primary MD for the patient is Kathyleen Parkins, MD  Consult requested in the Hospital by Cheyenne Cotta, MD, On 11/02/2023    Reason for consult : Chronic bilateral lower extremity edema with pain and blood pressure   With History of -  Past Medical History:  Diagnosis Date   Acute combined systolic and diastolic CHF, NYHA class 4 (HCC) 07/29/2020   a. EF 30% by echo in 07/2020 b. EF 55-60% by echo in 11/2020   Arthritis    Asthma    Atrial fibrillation (HCC)    CAD (coronary artery disease)    a. 07/2020: s/p NSTEMI with DES to proxLAD and DES to proxLCx with medical management recommended of the occluded RCA.   Chronic bronchitis (HCC)    COPD (chronic obstructive pulmonary disease) (HCC)    Edema extremities    History of hiatal hernia    Hyperlipidemia LDL goal <70 07/29/2020   Ovarian cyst    Pneumonia    Rheumatoid arthritis (HCC)       Past Surgical History:  Procedure Laterality Date   CESAREAN SECTION     CORONARY STENT INTERVENTION N/A 07/29/2020   Procedure: CORONARY STENT INTERVENTION;  Surgeon: Millicent Ally, MD;  [facility]: [facility];  Service: Cardiovascular;  Laterality: N/A;   KNEE ARTHROSCOPY WITH MEDIAL MENISECTOMY Right 05/23/2020   Procedure: KNEE ARTHROSCOPY WITH MEDIAL AND LATERAL MENISECTOMY; 3 COMPARTMENT SYNOVECTOMY;  Surgeon: Darrin Emerald, MD;  Location: [facility];  Service: Orthopedics;  Laterality: Right;   RIGHT/LEFT HEART CATH AND CORONARY ANGIOGRAPHY N/A 07/29/2020   Procedure: RIGHT/LEFT HEART CATH AND CORONARY ANGIOGRAPHY;  Surgeon: Millicent Ally, MD;  Location: [facility];  Service: Cardiovascular;  Laterality: N/A;   TONSILLECTOMY     TUBAL LIGATION      in for   Chief Complaint  Patient presents  with   Leg Pain    edema     HPI  Kristine Montgomery  is a 58 y.o. female, for evaluation of atrial fib.  Morbid obesity,  h/o CAD s/p MI, s/P PCI, chronic systolic CHF. - Presents to ED secondary to extremity pain, and chronic lower extremity edema, patient reports pain, in the right knee, after she twisted it when transferring from her wheelchair, while in ED her blood pressure reading has been noted to be low where she required fluid bolus, her workup in ED significant for normal creatinine, BNP within normal limit, white blood cell count within normal limits, afebrile, x-ray significant for stable cardiac silhouette with no active disease in her lungs, and right tibia-fibula x-ray with no acute osseous findings, had low blood pressure reading, she received fluid bolus, upon my evaluation I adjusted her to medium cuff, however I had couple readings within normal limits, she had the self denies any dizziness, lightheadedness, shortness of breath  or any complaints other than pain in her right knee if.   Review of Systems      A full 10 point Review of Systems was done, except as stated above, all other Review of Systems were negative.   Social History Social History   Tobacco Use   Smoking status: Former    Current packs/day: 0.00    Average packs/day: 0.5 packs/day for 30.0 years (15.0 ttl pk-yrs)    Types: Cigarettes    Start date: 07/1990    Quit date: 07/2020    Years since quitting: 3.3    Passive exposure: Past   Smokeless tobacco: Never  Substance Use Topics   Alcohol use: Not Currently   *  Family History Family History  Problem Relation Age of Onset   Cancer Paternal Grandfather        colon   Cancer Maternal Grandmother        lung   COPD Father    Non-Hodgkin's lymphoma Mother    Diabetes Mother    Cancer Mother        cervical   Congestive Heart Failure Mother    Hypertension Sister    Other Son        fluid around heart     Prior to Admission medications    Medication Sig Start Date End Date Taking? Authorizing Provider  aspirin  EC 81 MG tablet Take 81 mg by mouth daily. Swallow whole.   Yes [provider]  atorvastatin  (LIPITOR ) 80 MG tablet Take 1 tablet (80 mg total) by mouth daily. 08/11/23  Yes Elmyra Haggard, MD  dapagliflozin  propanediol (FARXIGA ) 10 MG TABS tablet Take 1 tablet (10 mg total) by mouth daily. 05/09/23  Yes Elmyra Haggard, MD  dronedarone  (MULTAQ ) 400 MG tablet Take 1 tablet (400 mg total) by mouth 2 (two) times daily with a meal. 06/14/23  Yes Elmyra Haggard, MD  ELIQUIS  5 MG TABS tablet TAKE ONE TABLET BY MOUTH 2 TIMES A DAY 07/06/23  Yes Elmyra Haggard, MD  famotidine  (PEPCID ) 40 MG tablet TAKE ONE TABLET BY MOUTH IN THE EVENING. 11/24/22  Yes Elmyra Haggard, MD  furosemide  (LASIX ) 20 MG tablet Take 20 mg by mouth daily. 10/06/23  Yes [provider]  gabapentin  (NEURONTIN ) 100 MG capsule Take 1 capsule (100 mg total) by mouth 2 (two) times daily. 07/26/23  Yes Ellene Gustin, MD  HYDROcodone -acetaminophen  (NORCO) 10-325 MG tablet Take 1 tablet by mouth every 6 (six) hours as needed for moderate pain (pain score 4-6).   Yes [provider]  metoprolol  succinate (TOPROL -XL) 25 MG 24 hr tablet Take 0.5 tablets (12.5 mg total) by mouth daily. 08/11/23  Yes Elmyra Haggard, MD  nitroGLYCERIN  (NITROSTAT ) 0.4 MG SL tablet Place 0.4 mg under the tongue every 5 (five) minutes as needed for chest pain.   Yes [provider]  sacubitril -valsartan  (ENTRESTO ) 24-26 MG Take 1 tablet by mouth 2 (two) times daily. 10/05/23  Yes Elmyra Haggard, MD    Anti-infectives (From admission, onward)    None       Scheduled Meds: Continuous Infusions: PRN Meds:.  No Known Allergies  Physical Exam  Vitals  Blood pressure (!) 119/55, pulse 73, temperature (!) 97.5 F (36.4 C), temperature source Temporal, resp. rate 13, height 5' 1 (1.549 m), weight 125.2 kg, last menstrual period 06/27/2017, SpO2 94%.   1.  General Alert female, laying in bed, no apparent distress  2. Normal affect and  insight, Not Suicidal or Homicidal, Awake Alert, Oriented X 3.  3. No F.N deficits, ALL C.Nerves Intact, Strength 5/5 all 4 extremities, Sensation intact all 4 extremities, Plantars down going.  4. Ears and Eyes appear Normal, Conjunctivae clear, PERRLA. Moist Oral Mucosa.  5. Supple Neck, No JVD, No cervical lymphadenopathy appriciated, No Carotid Bruits.  6. Symmetrical Chest wall movement, Good air movement bilaterally, CTAB.  7. RRR, No Gallops, Rubs or Murmurs, No Parasternal Heave.  8. Positive Bowel Sounds, Abdomen Soft, No tenderness, No organomegaly appriciated,No rebound -guarding or rigidity.  9.  No Cyanosis, Normal Skin Turgor, bilateral lower extremity skin edema, nonpitting, including the feet, warm to palpation bilaterally, at baseline  10. Good muscle tone,  joints appear normal , no effusions, Normal ROM.   Data Review  CBC Recent Labs  Lab 11/02/23 1601  WBC 8.9  HGB 9.4*  HCT 31.1*  PLT 245  MCV 84.5  MCH 25.5*  MCHC 30.2  RDW 17.4*  LYMPHSABS 1.8  MONOABS 0.9  EOSABS 0.2  BASOSABS 0.0   ------------------------------------------------------------------------------------------------------------------  Chemistries  Recent Labs  Lab 11/02/23 1601  NA 141  K 3.8  CL 106  CO2 24  GLUCOSE 100*  BUN 15  CREATININE 0.78  CALCIUM  8.7*  AST 16  ALT 15  ALKPHOS 78  BILITOT 0.7   ------------------------------------------------------------------------------------------------------------------ estimated creatinine clearance is 95.4 mL/min (by C-G formula based on SCr of 0.78 mg/dL). ------------------------------------------------------------------------------------------------------------------ No results for input(s): TSH, T4TOTAL, T3FREE, THYROIDAB in the last 72 hours.  Invalid input(s): FREET3   Coagulation profile No results for input(s): INR,  PROTIME in the last 168 hours. ------------------------------------------------------------------------------------------------------------------- No results for input(s): DDIMER in the last 72 hours. -------------------------------------------------------------------------------------------------------------------  Cardiac Enzymes No results for input(s): CKMB, TROPONINI, MYOGLOBIN in the last 168 hours.  Invalid input(s): CK ------------------------------------------------------------------------------------------------------------------ Invalid input(s): POCBNP   ---------------------------------------------------------------------------------------------------------------  Urinalysis    Component Value Date/Time   COLORURINE YELLOW 09/02/2022 1549   APPEARANCEUR HAZY (A) 09/02/2022 1549   APPEARANCEUR Clear 02/27/2020 1530   LABSPEC 1.026 09/02/2022 1549   PHURINE 5.0 09/02/2022 1549   GLUCOSEU >=500 (A) 09/02/2022 1549   HGBUR SMALL (A) 09/02/2022 1549   BILIRUBINUR NEGATIVE 09/02/2022 1549   BILIRUBINUR Negative 02/27/2020 1530   KETONESUR NEGATIVE 09/02/2022 1549   PROTEINUR NEGATIVE 09/02/2022 1549   NITRITE NEGATIVE 09/02/2022 1549   LEUKOCYTESUR NEGATIVE 09/02/2022 1549     Imaging results:   DG Chest Portable 1 View Result Date: 11/02/2023 CLINICAL DATA:  Peripheral edema, pain EXAM: PORTABLE CHEST 1 VIEW COMPARISON:  02/28/2023 FINDINGS: Single frontal view of the chest demonstrates a stable enlarged cardiac silhouette. No airspace disease, effusion, or pneumothorax. No acute bony abnormalities. IMPRESSION: 1. Stable enlarged cardiac silhouette. 2. No acute airspace disease. Electronically Signed   By: Bobbye Burrow M.D.   On: 11/02/2023 16:57   DG Tibia/Fibula Right Result Date: 11/02/2023 CLINICAL DATA:  Lower extremity edema, pain EXAM: RIGHT TIBIA AND FIBULA - 2 VIEW COMPARISON:  03/15/2023 FINDINGS: Frontal and lateral views of the right tibia  and fibula are obtained. The dorsal soft tissues are excluded on the lateral views by collimation. There are no acute or destructive bony abnormalities. Alignment of the knee and ankle is anatomic. Stable osteoarthritis. There is diffuse subcutaneous edema throughout the entirety of the right lower leg, greatest distally and at the ankle. No subcutaneous gas or radiopaque foreign body. IMPRESSION: 1. Diffuse soft tissue swelling. 2. No acute or destructive bony abnormality. Electronically Signed   By: Bambi Lever  Bevin Bucks M.D.   On: 11/02/2023 16:56    Assessment & Plan  Active Problems:   Lower extremity edema   Chronic bilateral lower extremity edema -Presentation to ED secondary to lower extremity edema, by reviewing previous records, and upon examining patient, she does report this is chronic has been ongoing for a while now, but she is having recently worsening pain as she twisted her foot few days ago, but as advised for her edema they appear no significant change. - X-ray is reassuring no evidence of fracture. - Lower extremity edema, is chronic, likely chronic venous insufficiency edema, as it does appear to be symmetrical, bilaterally, and progressive, does include the feet which is more frequent for lymphedema, but regardless, she will need further workup and evaluation by lymphedema/chronic venous insufficiency edema specialist, so referral has been made to antipain physical therapy center for further management of her edema (as management of this is edema usually does not require increased Lasix , she will need local management like a new lymphedema drainage, and compression bandaging, exercise and education) -She would benefit from referral to weight management clinic as certainly improving her morbid obesity would result in improvement of her chronic lower extremity edema. -No concern of DVT as she is on Eliquis , and this is chronic process - There was a concern of hypotension, but once blood  pressure recheck and appropriate medium size blood pressure cuff, it is at baseline, she denies any symptoms of dizziness, lightheadedness, so he was instructed to continue her cardiac meds with no changes, and started to keep her cardiology follow-up appointment on 11/10/2023 with Dr. Avanell Bob.  - Patient can be discharged home from ED    Seena Dadds M.D on 11/02/2023 at 6:19 PM     Thank you for the consult, we will follow the patient with you in the Hospital.   [facility]   Office  [phone]

## 2023-11-03 LAB — BLOOD CULTURE ID PANEL (REFLEXED) - BCID2

## 2023-11-03 NOTE — ED Notes (Signed)
 Date and time results received: 11/03/23 1724   Test: Blood Culture Critical Value: Staph Epi w/[place] A Resistance + gram positive cocci  Name of Provider Notified: Bryna Car, MD  Orders Received? Or Actions Taken?: Dr. Bryna Car notified the [facility] called to report results. EDP did not feel Pt needed to be notified or return to the hospital for further evaluation.

## 2023-11-04 LAB — CULTURE, BLOOD (ROUTINE X 2)

## 2023-11-05 ENCOUNTER — Inpatient Hospital Stay (HOSPITAL_COMMUNITY)
Admission: EM | Admit: 2023-11-05 | Discharge: 2023-11-10 | DRG: 603 | Disposition: A | Discharge status: Skilled Nursing Facility | Attending: Internal Medicine | Admitting: Internal Medicine

## 2023-11-05 ENCOUNTER — Other Ambulatory Visit: Payer: Self-pay | Admitting: Internal Medicine

## 2023-11-05 ENCOUNTER — Other Ambulatory Visit: Payer: Self-pay

## 2023-11-05 ENCOUNTER — Emergency Department (HOSPITAL_COMMUNITY)

## 2023-11-05 DIAGNOSIS — Z87891 Personal history of nicotine dependence: Secondary | ICD-10-CM

## 2023-11-05 DIAGNOSIS — Z955 Presence of coronary angioplasty implant and graft: Secondary | ICD-10-CM

## 2023-11-05 DIAGNOSIS — M1990 Unspecified osteoarthritis, unspecified site: Secondary | ICD-10-CM | POA: Diagnosis present

## 2023-11-05 DIAGNOSIS — R531 Weakness: Secondary | ICD-10-CM

## 2023-11-05 DIAGNOSIS — I252 Old myocardial infarction: Secondary | ICD-10-CM

## 2023-11-05 DIAGNOSIS — L0390 Cellulitis, unspecified: Secondary | ICD-10-CM | POA: Diagnosis present

## 2023-11-05 DIAGNOSIS — Z79899 Other long term (current) drug therapy: Secondary | ICD-10-CM | POA: Diagnosis not present

## 2023-11-05 DIAGNOSIS — Z7982 Long term (current) use of aspirin: Secondary | ICD-10-CM | POA: Diagnosis not present

## 2023-11-05 DIAGNOSIS — I959 Hypotension, unspecified: Principal | ICD-10-CM | POA: Diagnosis present

## 2023-11-05 DIAGNOSIS — G629 Polyneuropathy, unspecified: Secondary | ICD-10-CM | POA: Diagnosis present

## 2023-11-05 DIAGNOSIS — R0902 Hypoxemia: Secondary | ICD-10-CM | POA: Diagnosis present

## 2023-11-05 DIAGNOSIS — R209 Unspecified disturbances of skin sensation: Secondary | ICD-10-CM

## 2023-11-05 DIAGNOSIS — E876 Hypokalemia: Secondary | ICD-10-CM | POA: Diagnosis not present

## 2023-11-05 DIAGNOSIS — Z801 Family history of malignant neoplasm of trachea, bronchus and lung: Secondary | ICD-10-CM

## 2023-11-05 DIAGNOSIS — I255 Ischemic cardiomyopathy: Secondary | ICD-10-CM | POA: Diagnosis present

## 2023-11-05 DIAGNOSIS — M069 Rheumatoid arthritis, unspecified: Secondary | ICD-10-CM | POA: Diagnosis present

## 2023-11-05 DIAGNOSIS — Z6841 Body Mass Index (BMI) 40.0 and over, adult: Secondary | ICD-10-CM

## 2023-11-05 DIAGNOSIS — Z8049 Family history of malignant neoplasm of other genital organs: Secondary | ICD-10-CM

## 2023-11-05 DIAGNOSIS — E785 Hyperlipidemia, unspecified: Secondary | ICD-10-CM | POA: Diagnosis present

## 2023-11-05 DIAGNOSIS — Z8249 Family history of ischemic heart disease and other diseases of the circulatory system: Secondary | ICD-10-CM | POA: Diagnosis not present

## 2023-11-05 DIAGNOSIS — L03115 Cellulitis of right lower limb: Principal | ICD-10-CM | POA: Diagnosis present

## 2023-11-05 DIAGNOSIS — L03119 Cellulitis of unspecified part of limb: Secondary | ICD-10-CM | POA: Diagnosis not present

## 2023-11-05 DIAGNOSIS — L03116 Cellulitis of left lower limb: Secondary | ICD-10-CM | POA: Diagnosis present

## 2023-11-05 DIAGNOSIS — Z7984 Long term (current) use of oral hypoglycemic drugs: Secondary | ICD-10-CM

## 2023-11-05 DIAGNOSIS — I25110 Atherosclerotic heart disease of native coronary artery with unstable angina pectoris: Secondary | ICD-10-CM | POA: Diagnosis present

## 2023-11-05 DIAGNOSIS — I872 Venous insufficiency (chronic) (peripheral): Secondary | ICD-10-CM | POA: Diagnosis present

## 2023-11-05 DIAGNOSIS — Z8701 Personal history of pneumonia (recurrent): Secondary | ICD-10-CM

## 2023-11-05 DIAGNOSIS — Z7401 Bed confinement status: Secondary | ICD-10-CM | POA: Diagnosis not present

## 2023-11-05 DIAGNOSIS — R600 Localized edema: Secondary | ICD-10-CM | POA: Diagnosis not present

## 2023-11-05 DIAGNOSIS — Z800 Family history of malignant neoplasm of digestive organs: Secondary | ICD-10-CM

## 2023-11-05 DIAGNOSIS — I480 Paroxysmal atrial fibrillation: Secondary | ICD-10-CM | POA: Diagnosis present

## 2023-11-05 DIAGNOSIS — J4489 Other specified chronic obstructive pulmonary disease: Secondary | ICD-10-CM | POA: Diagnosis present

## 2023-11-05 DIAGNOSIS — I5042 Chronic combined systolic (congestive) and diastolic (congestive) heart failure: Secondary | ICD-10-CM | POA: Diagnosis present

## 2023-11-05 DIAGNOSIS — Z5982 Transportation insecurity: Secondary | ICD-10-CM

## 2023-11-05 DIAGNOSIS — Z807 Family history of other malignant neoplasms of lymphoid, hematopoietic and related tissues: Secondary | ICD-10-CM

## 2023-11-05 DIAGNOSIS — I5043 Acute on chronic combined systolic (congestive) and diastolic (congestive) heart failure: Secondary | ICD-10-CM | POA: Diagnosis present

## 2023-11-05 DIAGNOSIS — Z825 Family history of asthma and other chronic lower respiratory diseases: Secondary | ICD-10-CM

## 2023-11-05 DIAGNOSIS — I2510 Atherosclerotic heart disease of native coronary artery without angina pectoris: Secondary | ICD-10-CM | POA: Diagnosis present

## 2023-11-05 DIAGNOSIS — M7989 Other specified soft tissue disorders: Secondary | ICD-10-CM | POA: Diagnosis present

## 2023-11-05 DIAGNOSIS — R001 Bradycardia, unspecified: Secondary | ICD-10-CM | POA: Diagnosis not present

## 2023-11-05 DIAGNOSIS — E66813 Obesity, class 3: Secondary | ICD-10-CM | POA: Diagnosis not present

## 2023-11-05 DIAGNOSIS — Z7901 Long term (current) use of anticoagulants: Secondary | ICD-10-CM | POA: Diagnosis not present

## 2023-11-05 DIAGNOSIS — Z833 Family history of diabetes mellitus: Secondary | ICD-10-CM

## 2023-11-05 DIAGNOSIS — R208 Other disturbances of skin sensation: Secondary | ICD-10-CM

## 2023-11-05 LAB — CBC WITH DIFFERENTIAL/PLATELET
Abs Immature Granulocytes: 0.04 10*3/uL (ref 0.00–0.07)
Basophils Absolute: 0 10*3/uL (ref 0.0–0.1)
Basophils Relative: 0 %
Eosinophils Absolute: 0.2 10*3/uL (ref 0.0–0.5)
Eosinophils Relative: 3 %
HCT: 32.3 % — ABNORMAL LOW (ref 36.0–46.0)
Hemoglobin: 9.3 g/dL — ABNORMAL LOW (ref 12.0–15.0)
Immature Granulocytes: 1 %
Lymphocytes Relative: 19 %
Lymphs Abs: 1.6 10*3/uL (ref 0.7–4.0)
MCH: 24.4 pg — ABNORMAL LOW (ref 26.0–34.0)
MCHC: 28.8 g/dL — ABNORMAL LOW (ref 30.0–36.0)
MCV: 84.8 fL (ref 80.0–100.0)
Monocytes Absolute: 0.9 10*3/uL (ref 0.1–1.0)
Monocytes Relative: 10 %
Neutro Abs: 5.9 10*3/uL (ref 1.7–7.7)
Neutrophils Relative %: 67 %
Platelets: 264 10*3/uL (ref 150–400)
RBC: 3.81 MIL/uL — ABNORMAL LOW (ref 3.87–5.11)
RDW: 17.6 % — ABNORMAL HIGH (ref 11.5–15.5)
WBC: 8.8 10*3/uL (ref 4.0–10.5)
nRBC: 0 % (ref 0.0–0.2)

## 2023-11-05 LAB — LACTIC ACID, PLASMA
Lactic Acid, Venous: 1.2 mmol/L (ref 0.5–1.9)
Lactic Acid, Venous: 1.1 mmol/L (ref 0.5–1.9)

## 2023-11-05 LAB — COMPREHENSIVE METABOLIC PANEL WITH GFR
ALT: 11 U/L (ref 0–44)
AST: 17 U/L (ref 15–41)
Albumin: 2.6 g/dL — ABNORMAL LOW (ref 3.5–5.0)
Alkaline Phosphatase: 71 U/L (ref 38–126)
Anion gap: 10 (ref 5–15)
BUN: 16 mg/dL (ref 6–20)
CO2: 24 mmol/L (ref 22–32)
Calcium: 8.4 mg/dL — ABNORMAL LOW (ref 8.9–10.3)
Chloride: 107 mmol/L (ref 98–111)
Creatinine, Ser: 1.01 mg/dL — ABNORMAL HIGH (ref 0.44–1.00)
GFR, Estimated: >60 mL/min (ref >60)
Glucose, Bld: 118 mg/dL — ABNORMAL HIGH (ref 70–99)
Potassium: 3.6 mmol/L (ref 3.5–5.1)
Sodium: 141 mmol/L (ref 135–145)
Total Bilirubin: 1 mg/dL (ref 0.0–1.2)
Total Protein: 7.2 g/dL (ref 6.5–8.1)

## 2023-11-05 LAB — BRAIN NATRIURETIC PEPTIDE: B Natriuretic Peptide: 91 pg/mL (ref 0.0–100.0)

## 2023-11-05 LAB — MRSA NEXT GEN BY PCR, NASAL: MRSA by PCR Next Gen: NOT DETECTED

## 2023-11-05 MED ORDER — HYDROCODONE-ACETAMINOPHEN 10-325 MG PO TABS
1.0000 | ORAL_TABLET | Freq: Four times a day (QID) | ORAL | Status: DC | PRN
  Administered 2023-11-05 – 2023-11-10 (×11): 1 via ORAL
  Filled 2023-11-05 (×11): qty 1

## 2023-11-05 MED ORDER — APIXABAN 5 MG PO TABS
5.0000 mg | ORAL_TABLET | Freq: Two times a day (BID) | ORAL | Status: DC
  Administered 2023-11-05 – 2023-11-10 (×11): 5 mg via ORAL
  Filled 2023-11-05 (×11): qty 1

## 2023-11-05 MED ORDER — SODIUM CHLORIDE 0.9 % IV SOLN
2.0000 g | INTRAVENOUS | Status: DC
  Administered 2023-11-06 – 2023-11-07 (×2): 2 g via INTRAVENOUS
  Filled 2023-11-05 (×2): qty 20

## 2023-11-05 MED ORDER — POLYETHYLENE GLYCOL 3350 17 G PO PACK: 17.0000 g | PACK | Freq: Every day | ORAL | Status: DC | PRN

## 2023-11-05 MED ORDER — ASPIRIN 81 MG PO TBEC
81.0000 mg | DELAYED_RELEASE_TABLET | Freq: Every day | ORAL | Status: DC
  Administered 2023-11-06 – 2023-11-10 (×5): 81 mg via ORAL
  Filled 2023-11-05 (×5): qty 1

## 2023-11-05 MED ORDER — ONDANSETRON HCL 4 MG/2ML IJ SOLN
4.0000 mg | Freq: Four times a day (QID) | INTRAMUSCULAR | Status: DC | PRN
  Administered 2023-11-06: 4 mg via INTRAVENOUS
  Filled 2023-11-05: qty 2

## 2023-11-05 MED ORDER — SODIUM CHLORIDE 0.9 % IV SOLN
1.0000 g | Freq: Once | INTRAVENOUS | Status: AC
  Administered 2023-11-05: 1 g via INTRAVENOUS
  Filled 2023-11-05: qty 10

## 2023-11-05 MED ORDER — DRONEDARONE HCL 400 MG PO TABS
400.0000 mg | ORAL_TABLET | Freq: Two times a day (BID) | ORAL | Status: DC
  Administered 2023-11-06 – 2023-11-10 (×10): 400 mg via ORAL
  Filled 2023-11-05 (×12): qty 1

## 2023-11-05 MED ORDER — ALBUMIN HUMAN 25 % IV SOLN
25.0000 g | Freq: Once | INTRAVENOUS | Status: AC
  Administered 2023-11-05: 25 g via INTRAVENOUS
  Filled 2023-11-05: qty 100

## 2023-11-05 MED ORDER — ACETAMINOPHEN 650 MG RE SUPP
650.0000 mg | Freq: Four times a day (QID) | RECTAL | Status: DC | PRN
Start: 2023-11-05 — End: 2023-11-11

## 2023-11-05 MED ORDER — ATORVASTATIN CALCIUM 40 MG PO TABS
80.0000 mg | ORAL_TABLET | Freq: Every day | ORAL | Status: DC
  Administered 2023-11-05 – 2023-11-08 (×4): 80 mg via ORAL
  Filled 2023-11-05 (×4): qty 2

## 2023-11-05 MED ORDER — ACETAMINOPHEN 325 MG PO TABS
650.0000 mg | ORAL_TABLET | Freq: Four times a day (QID) | ORAL | Status: DC | PRN
  Administered 2023-11-07 – 2023-11-08 (×2): 650 mg via ORAL
  Filled 2023-11-05 (×3): qty 2

## 2023-11-05 MED ORDER — CHLORHEXIDINE GLUCONATE CLOTH 2 % EX PADS
6.0000 | MEDICATED_PAD | Freq: Every day | CUTANEOUS | Status: DC
  Administered 2023-11-06 – 2023-11-08 (×2): 6 via TOPICAL

## 2023-11-05 MED ORDER — ONDANSETRON HCL 4 MG PO TABS: 4.0000 mg | ORAL_TABLET | Freq: Four times a day (QID) | ORAL | Status: DC | PRN

## 2023-11-05 MED ORDER — GABAPENTIN 100 MG PO CAPS
200.0000 mg | ORAL_CAPSULE | Freq: Two times a day (BID) | ORAL | Status: DC
  Administered 2023-11-05 – 2023-11-10 (×11): 200 mg via ORAL
  Filled 2023-11-05 (×11): qty 2

## 2023-11-05 NOTE — Progress Notes (Deleted)
 Cardiology Office Note   Date:  11/05/2023   ID:  Kristine Montgomery, DOB 06-14-1965, MRN [id number]  PCP:  Bertell Satterfield, MD  Cardiologist:   Vina Gull, MD   Pateint presents for f/u of CAD     History of Present Illness: Kristine Montgomery is a 58 y.o. female with a historyof CAD (s/p NSTEMI in 07/2020 with DES to prox-LAD and DES to prox-LCx and medical management recommended of occluded RCA), HFimpEF (EF 30% by echo in 07/2020, at 55-60% by echo in  Hospitalized for afib with RVR  Converted with amiodarone     I saw the pt in clinic in October 2023   She was seen in ER for LE edema in Feb 2024   Given IV lasix  with diruesis She was seen by ONEIDA Fabry in Feb 2024  Lasix  increased to 40 bid x 5 day then reduced to 40 daily     Since seen she denies CP   Breathing is OK though she is not very active    She does note increased swelling in feet   Admits to eating saltier foods     I saw the pt in clnic in July 2024   She was seen by KANDICE Birmingham in the interval  No outpatient medications have been marked as taking for the 11/10/23 encounter (Appointment) with Gull Vina GAILS, MD.     Allergies:   Patient has no known allergies.   Past Medical History:  Diagnosis Date   Acute combined systolic and diastolic CHF, NYHA class 4 (HCC) 07/29/2020   a. EF 30% by echo in 07/2020 b. EF 55-60% by echo in 11/2020   Arthritis    Asthma    Atrial fibrillation (HCC)    CAD (coronary artery disease)    a. 07/2020: s/p NSTEMI with DES to proxLAD and DES to proxLCx with medical management recommended of the occluded RCA.   Chronic bronchitis (HCC)    COPD (chronic obstructive pulmonary disease) (HCC)    Edema extremities    History of hiatal hernia    Hyperlipidemia LDL goal <70 07/29/2020   Ovarian cyst    Pneumonia    Rheumatoid arthritis (HCC)     Past Surgical History:  Procedure Laterality Date   CESAREAN SECTION     CORONARY STENT INTERVENTION N/A 07/29/2020   Procedure: CORONARY STENT  INTERVENTION;  Surgeon: Burnard Debby LABOR, MD;  [facility]: [facility];  Service: Cardiovascular;  Laterality: N/A;   KNEE ARTHROSCOPY WITH MEDIAL MENISECTOMY Right 05/23/2020   Procedure: KNEE ARTHROSCOPY WITH MEDIAL AND LATERAL MENISECTOMY; 3 COMPARTMENT SYNOVECTOMY;  Surgeon: Margrette Taft BRAVO, MD;  Location: [facility];  Service: Orthopedics;  Laterality: Right;   RIGHT/LEFT HEART CATH AND CORONARY ANGIOGRAPHY N/A 07/29/2020   Procedure: RIGHT/LEFT HEART CATH AND CORONARY ANGIOGRAPHY;  Surgeon: Burnard Debby LABOR, MD;  Location: [facility];  Service: Cardiovascular;  Laterality: N/A;   TONSILLECTOMY     TUBAL LIGATION       Social History:  The patient  reports that she quit smoking about 3 years ago. Her smoking use included cigarettes. She started smoking about 33 years ago. She has a 15 pack-year smoking history. She has been exposed to tobacco smoke. She has never used smokeless tobacco. She reports that she does not currently use alcohol. She reports that she does not use drugs.   Family History:  The patient's family history includes COPD in her father; Cancer in her maternal grandmother, mother,  and paternal grandfather; Congestive Heart Failure in her mother; Diabetes in her mother; Hypertension in her sister; Non-Hodgkin's lymphoma in her mother; Other in her son.    ROS:  Please see the history of present illness. All other systems are reviewed and  Negative to the above problem except as noted.    PHYSICAL EXAM: VS:  LMP 06/27/2017 (Within Weeks)   GEN: Morbidly obese in no acute distress Examined in chair HEENT: normal  Neck: no JVD, no carotid bruits Cardiac: RRR; no murmur   1+ LE  edema  Respiratory:  clear to auscultation GI: soft, nontender, obese MS: no deformity   EKG:  EKG is not ordered today.  Echo   March 2024 1. Left ventricular ejection fraction, by estimation, is 55 to 60%. The  left ventricle has normal function. The left ventricle demonstrates   regional wall motion abnormalities (see scoring diagram/findings for  description). There is mild left ventricular   hypertrophy. Left ventricular diastolic parameters are consistent with  Grade I diastolic dysfunction (impaired relaxation).   2. Right ventricular systolic function is normal. The right ventricular  size is normal. Tricuspid regurgitation signal is inadequate for assessing  PA pressure.   3. The mitral valve is grossly normal. Trivial mitral valve  regurgitation.   4. The aortic valve is tricuspid. Aortic valve regurgitation is not  visualized.   5. The inferior vena cava is normal in size with greater than 50%  respiratory variability, suggesting right atrial pressure of 3 mmHg.   Comparison(s): Prior images reviewed side by side. LVEF remains normal  range at 55-60% and wall motion abnormalities are less apparent.    Lipid Panel    Component Value Date/Time   CHOL 112 06/02/2023 1154   TRIG 88 06/02/2023 1154   TRIG 126 08/21/2021 1402   HDL 41 06/02/2023 1154   HDL 49 08/21/2021 1402   CHOLHDL 2.7 06/02/2023 1154   VLDL 18 06/02/2023 1154   LDLCALC 53 06/02/2023 1154      Wt Readings from Last 3 Encounters:  11/05/23 275 lb (124.7 kg)  11/02/23 276 lb (125.2 kg)  10/06/23 276 lb (125.2 kg)      ASSESSMENT AND PLAN:  1  CAD   Last intervention in 2022  Pt  reports no symptoms of angina  Will review use of ASA given that she is on Eliquis        2  HL  Keep on statin   Will check lipids   3  Hx of HFrEF   Seen in ER earlier this year  Diuresed    Echo in March was 55 to 60%   Now breathing is OK but complains of edema  MIld today Will check CMET and BNP Instructed patient  to eat less salt  4  PAF  Regular rate  on Multaq .(Would continue even with mild volume increase.  Will do better in SR  Keep on Eliquis       5  CV dz   Carotid USN this year shows minimal plaquing     COntinue meds   Check CMET, CBC, BNP, lpids     Follow up tentatively  in spring 2025 Current medicines are reviewed at length with the patient today.  The patient does not have concerns regarding medicines.  Signed, Vina Gull, MD  11/05/2023 12:22 PM    [facility] [address] [place], [place], [place]  [address] Phone: [phone]; Fax: [phone]

## 2023-11-05 NOTE — Assessment & Plan Note (Signed)
 Rate controlled and on anticoagulation with Eliquis . - Resume Eliquis  - Hole metoprolol  for hypotension

## 2023-11-05 NOTE — Assessment & Plan Note (Addendum)
 Bilateral lower extremity edema, tenderness, erythema, differential warmth.  Rules out for sepsis.  Afebrile without leukocytosis.  WBC 8.8.  Normal lactic acid 1.2.  Reports compliance with Eliquis  and Lasix . -IV ceftriaxone  2 g daily -Blood cultures 6/18- 1/2 bottles growing Staph epidermidis- likely contaminant.  Follow-up repeat blood cultures drawn in ED today -Bilateral lower extremity venous Dopplers

## 2023-11-05 NOTE — Assessment & Plan Note (Addendum)
 Systolic down to 27/46 in the ED. Asymptomatic without dizziness.  Denies GI losses, and has maintained good oral intake.  She is on low dose- Lasix  20 mg daily.  Due to difficulty getting patient's blood pressure, a small adult blood pressure cuff was used and was placed on patient's forearm. Hence ??accuracy.  25 g of 25% albumin  solution already given in ED with improvement in blood pressure. -Hold further fluids for now -Hold metoprolol , Entresto , Lasix  20 mg for now

## 2023-11-05 NOTE — ED Notes (Signed)
 This RN spoke with pt about positive blood culture results and recommendation to come back in to be evaluated

## 2023-11-05 NOTE — ED Triage Notes (Signed)
 Had blood cultures drawn earlier this week.Was seen here for swelling in her lower legs. Called this morning, because her blood cultures were positive.

## 2023-11-05 NOTE — ED Notes (Signed)
 ED TO INPATIENT HANDOFF REPORT  ED Nurse Name and Phone #: Warren BECKER RN [phone]  S Name/Age/Gender Kristine Montgomery 58 y.o. female Room/Bed: APA12/APA12  Code Status   Code Status: Prior  Home/SNF/Other Home Patient oriented to: self, place, time, and situation Is this baseline? Yes   Triage Complete: Triage complete  Chief Complaint Hypotension [I95.9]  Triage Note Had blood cultures drawn earlier this week.Was seen here for swelling in her lower legs. Called this morning, because her blood cultures were positive.    Allergies No Known Allergies  Level of Care/Admitting Diagnosis ED Disposition     ED Disposition  Admit   Condition  --   Comment  Hospital Area: [facility] [[id number]]  Level of Care: Stepdown [14]  Covid Evaluation: Asymptomatic - no recent exposure (last 10 days) testing not required  Diagnosis: Hypotension [255160]  Admitting Physician: EMOKPAE, EJIROGHENE E [[id number]  Attending Physician: EMOKPAE, EJIROGHENE E [phone]  Certification:: I certify this patient will need inpatient services for at least 2 midnights  Expected Medical Readiness: 11/07/2023          B Medical/Surgery History Past Medical History:  Diagnosis Date   Acute combined systolic and diastolic CHF, NYHA class 4 (HCC) 07/29/2020   a. EF 30% by echo in 07/2020 b. EF 55-60% by echo in 11/2020   Arthritis    Asthma    Atrial fibrillation (HCC)    CAD (coronary artery disease)    a. 07/2020: s/p NSTEMI with DES to proxLAD and DES to proxLCx with medical management recommended of the occluded RCA.   Chronic bronchitis (HCC)    COPD (chronic obstructive pulmonary disease) (HCC)    Edema extremities    History of hiatal hernia    Hyperlipidemia LDL goal <70 07/29/2020   Ovarian cyst    Pneumonia    Rheumatoid arthritis (HCC)    Past Surgical History:  Procedure Laterality Date   CESAREAN SECTION     CORONARY STENT INTERVENTION N/A 07/29/2020   Procedure:  CORONARY STENT INTERVENTION;  Surgeon: Burnard Debby LABOR, MD;  Location: [facility];  Service: Cardiovascular;  Laterality: N/A;   KNEE ARTHROSCOPY WITH MEDIAL MENISECTOMY Right 05/23/2020   Procedure: KNEE ARTHROSCOPY WITH MEDIAL AND LATERAL MENISECTOMY; 3 COMPARTMENT SYNOVECTOMY;  Surgeon: Margrette Taft BRAVO, MD;  Location: [facility];  Service: Orthopedics;  Laterality: Right;   RIGHT/LEFT HEART CATH AND CORONARY ANGIOGRAPHY N/A 07/29/2020   Procedure: RIGHT/LEFT HEART CATH AND CORONARY ANGIOGRAPHY;  Surgeon: Burnard Debby LABOR, MD;  Location: [facility] CV [facility];  Service: Cardiovascular;  Laterality: N/A;   TONSILLECTOMY     TUBAL LIGATION       A IV Location/Drains/Wounds Patient Lines/Drains/Airways Status     Active Line/Drains/Airways     Name Placement date Placement time Site Days   Peripheral IV 11/05/23 20 G 1 Right Antecubital 11/05/23  1536  Antecubital  less than 1            Intake/Output Last 24 hours No intake or output data in the 24 hours ending 11/05/23 1734  Labs/Imaging Results for orders placed or performed during the hospital encounter of 11/05/23 (from the past 48 hours)  CBC with Differential     Status: Abnormal   Collection Time: 11/05/23  1:04 PM  Result Value Ref Range   WBC 8.8 4.0 - 10.5 K/uL   RBC 3.81 (L) 3.87 - 5.11 MIL/uL   Hemoglobin 9.3 (L) 12.0 - 15.0 g/dL   HCT 67.6 (  L) 36.0 - 46.0 %   MCV 84.8 80.0 - 100.0 fL   MCH 24.4 (L) 26.0 - 34.0 pg   MCHC 28.8 (L) 30.0 - 36.0 g/dL   RDW 82.3 (H) 88.4 - 84.4 %   Platelets 264 150 - 400 K/uL   nRBC 0.0 0.0 - 0.2 %   Neutrophils Relative % 67 %   Neutro Abs 5.9 1.7 - 7.7 K/uL   Lymphocytes Relative 19 %   Lymphs Abs 1.6 0.7 - 4.0 K/uL   Monocytes Relative 10 %   Monocytes Absolute 0.9 0.1 - 1.0 K/uL   Eosinophils Relative 3 %   Eosinophils Absolute 0.2 0.0 - 0.5 K/uL   Basophils Relative 0 %   Basophils Absolute 0.0 0.0 - 0.1 K/uL   Immature Granulocytes 1 %   Abs Immature Granulocytes  0.04 0.00 - 0.07 K/uL    Comment: Performed at [facility], [address] [place], [place] [address]  Comprehensive metabolic panel     Status: Abnormal   Collection Time: 11/05/23  1:04 PM  Result Value Ref Range   Sodium 141 135 - 145 mmol/L   Potassium 3.6 3.5 - 5.1 mmol/L   Chloride 107 98 - 111 mmol/L   CO2 24 22 - 32 mmol/L   Glucose, Bld 118 (H) 70 - 99 mg/dL    Comment: Glucose reference range applies only to samples taken after fasting for at least 8 hours.   BUN 16 6 - 20 mg/dL   Creatinine, Ser 8.98 (H) 0.44 - 1.00 mg/dL   Calcium  8.4 (L) 8.9 - 10.3 mg/dL   Total Protein 7.2 6.5 - 8.1 g/dL   Albumin  2.6 (L) 3.5 - 5.0 g/dL   AST 17 15 - 41 U/L   ALT 11 0 - 44 U/L   Alkaline Phosphatase 71 38 - 126 U/L   Total Bilirubin 1.0 0.0 - 1.2 mg/dL   GFR, Estimated >39 >39 mL/min    Comment: (NOTE) Calculated using the CKD-EPI Creatinine Equation (2021)    Anion gap 10 5 - 15    Comment: Performed at [facility], [address] [place], [place] [address]  Brain natriuretic peptide     Status: None   Collection Time: 11/05/23  1:04 PM  Result Value Ref Range   B Natriuretic Peptide 91.0 0.0 - 100.0 pg/mL    Comment: Performed at [facility], [address] [place], [place] [address]  Blood culture (routine x 2)     Status: None (Preliminary result)   Collection Time: 11/05/23  3:27 PM   Specimen: BLOOD LEFT ARM  Result Value Ref Range   Specimen Description BLOOD LEFT ARM BOTTLES DRAWN AEROBIC AND ANAEROBIC    Special Requests      Blood Culture adequate volume Performed at [facility], [address], [place], [place] [address]    Culture PENDING    Report Status PENDING   Blood culture (routine x 2)     Status: None (Preliminary result)   Collection Time: 11/05/23  3:27 PM   Specimen: BLOOD RIGHT ARM  Result Value Ref Range   Specimen Description      BLOOD RIGHT ARM BOTTLES DRAWN AEROBIC AND ANAEROBIC   Special Requests      Blood Culture adequate  volume Performed at [facility], [address], [place], [place] [address]    Culture PENDING    Report Status PENDING   Lactic acid, plasma     Status: None   Collection  Time: 11/05/23  3:27 PM  Result Value Ref Range   Lactic Acid, Venous 1.2 0.5 - 1.9 mmol/L    Comment: Performed at [facility], [address] [place], [place] [address]  Lactic acid, plasma     Status: None   Collection Time: 11/05/23  4:39 PM  Result Value Ref Range   Lactic Acid, Venous 1.1 0.5 - 1.9 mmol/L    Comment: Performed at [facility], [address], [place], [place] [address]   DG Chest 2 View Result Date: 11/05/2023 CLINICAL DATA:  Lower extremity edema EXAM: CHEST - 2 VIEW COMPARISON:  None Available. FINDINGS: Normal mediastinum and cardiac silhouette. Normal pulmonary vasculature. No evidence of effusion, infiltrate, or pneumothorax. No acute bony abnormality. IMPRESSION: No acute cardiopulmonary process. Electronically Signed   By: Jackquline Boxer M.D.   On: 11/05/2023 12:34    Pending Labs Unresulted Labs (From admission, onward)    None       Vitals/Pain Today's Vitals   11/05/23 1630 11/05/23 1645 11/05/23 1700 11/05/23 1715  BP: (!) 93/54 (!) 94/51 (!) 100/50 (!) 105/52  Pulse: 64 66 66 66  Resp:      Temp:      TempSrc:      SpO2: 99% 97% 95% 97%  Weight:      Height:      PainSc:        Isolation Precautions No active isolations  Medications Medications  cefTRIAXone  (ROCEPHIN ) 1 g in sodium chloride  0.9 % 100 mL IVPB (1 g Intravenous New Bag/Given 11/05/23 1731)  albumin  human 25 % solution 25 g (25 g Intravenous New Bag/Given 11/05/23 1537)    Mobility non-ambulatory     Focused Assessments Cardiac Assessment Handoff:    No results found for: CKTOTAL, CKMB, CKMBINDEX, TROPONINI No results found for: DDIMER Does the Patient currently have chest pain? No    R Recommendations: See Admitting Provider Note  Report given to:   Additional Notes: Pain  to bilateral legs, along with warmth and swelling.

## 2023-11-05 NOTE — ED Provider Notes (Signed)
 Pt signed out by Dr. Jakie pending additional labs and albumin  infusion.  Pt's lactic acids are normal, but BP remains low.  Pt said she has also noticed pinkness to both legs and the legs are hurting more than usual.  Both legs are developing cellulitis, so she's given rocephin .    Pt d/w Dr. Pearlean (triad) who will admit.   Dean Clarity, MD 11/05/23 [phone]

## 2023-11-05 NOTE — ED Provider Notes (Signed)
 [place] EMERGENCY DEPARTMENT AT [facility] Provider Note   CSN: [id number] Arrival date & time: 11/05/23  1020     Patient presents with: Abnormal Lab (Positive blood cultures)   Kristine Montgomery is a 58 y.o. female.   58 year old female with history of CHF, atrial fibrillation on Eliquis , and COPD who presents emergency department because of positive blood cultures.  Patient reports that she was called this morning due to positive blood cultures.  She is growing Staph epidermidis in 1 of 2 bottles after 3 days.  Denies any fevers or chills.  No rashes.  Says that she is having bilateral lower extremity pain but no focal joint pain for me.  Was seen here yesterday and was discharged home after being found to have lower extremity edema.  Says she is not on any diuretics.       Prior to Admission medications   Medication Sig Start Date End Date Taking? Authorizing Provider  aspirin  EC 81 MG tablet Take 81 mg by mouth daily. Swallow whole.   Yes [provider]  atorvastatin  (LIPITOR ) 80 MG tablet Take 1 tablet (80 mg total) by mouth daily. 08/11/23  Yes Okey Vina GAILS, MD  dapagliflozin  propanediol (FARXIGA ) 10 MG TABS tablet Take 1 tablet (10 mg total) by mouth daily. 05/09/23  Yes Okey Vina GAILS, MD  dronedarone  (MULTAQ ) 400 MG tablet Take 1 tablet (400 mg total) by mouth 2 (two) times daily with a meal. 06/14/23  Yes Okey Vina GAILS, MD  ELIQUIS  5 MG TABS tablet TAKE ONE TABLET BY MOUTH 2 TIMES A DAY 07/06/23  Yes Okey Vina GAILS, MD  famotidine  (PEPCID ) 40 MG tablet TAKE ONE TABLET BY MOUTH IN THE EVENING. 11/24/22  Yes Okey Vina GAILS, MD  furosemide  (LASIX ) 20 MG tablet Take 20 mg by mouth daily. 10/06/23  Yes [provider]  gabapentin  (NEURONTIN ) 100 MG capsule Take 2 capsules (200 mg total) by mouth 2 (two) times daily. 11/02/23  Yes Idol, Julie, PA-C  HYDROcodone -acetaminophen  (NORCO) 10-325 MG tablet Take 1 tablet by mouth every 6 (six) hours as needed for  moderate pain (pain score 4-6).   Yes [provider]  metoprolol  succinate (TOPROL -XL) 25 MG 24 hr tablet Take 0.5 tablets (12.5 mg total) by mouth daily. 08/11/23  Yes Okey Vina GAILS, MD  nitroGLYCERIN  (NITROSTAT ) 0.4 MG SL tablet Place 0.4 mg under the tongue every 5 (five) minutes as needed for chest pain.   Yes [provider]  Oxycodone  HCl 10 MG TABS Take 1 tablet (10 mg total) by mouth 4 (four) times daily. 11/02/23  Yes Idol, Mliss, PA-C  sacubitril -valsartan  (ENTRESTO ) 24-26 MG Take 1 tablet by mouth 2 (two) times daily. 10/05/23  Yes Okey Vina GAILS, MD    Allergies: Patient has no known allergies.    Review of Systems  Updated Vital Signs BP (!) 112/50   Pulse 66   Temp 99.2 F (37.3 C) (Oral)   Resp 11   Ht 5' 1 (1.549 m)   Wt 129.8 kg   LMP 06/27/2017 (Within Weeks)   SpO2 94%   BMI 54.07 kg/m   Physical Exam Vitals and nursing note reviewed.  Constitutional:      General: She is not in acute distress.    Appearance: She is well-developed.  HENT:     Head: Normocephalic and atraumatic.     Right Ear: External ear normal.     Left Ear: External ear normal.  Nose: Nose normal.   Eyes:     Extraocular Movements: Extraocular movements intact.     Conjunctiva/sclera: Conjunctivae normal.     Pupils: Pupils are equal, round, and reactive to light.    Cardiovascular:     Rate and Rhythm: Normal rate and regular rhythm.     Heart sounds: No murmur heard.    Comments: DP pulses dopplerable bilaterally Pulmonary:     Effort: Pulmonary effort is normal. No respiratory distress.     Breath sounds: Normal breath sounds.   Musculoskeletal:     Cervical back: Normal range of motion and neck supple.     Right lower leg: Edema present.     Left lower leg: Edema present.     Comments: No joint effusion of the knees or ankles.  Full range of motion of the bilateral knees.  No warmth or erythema bilateral knees   Skin:    General: Skin is warm and  dry.   Neurological:     Mental Status: She is alert and oriented to person, place, and time. Mental status is at baseline.   Psychiatric:        Mood and Affect: Mood normal.     (all labs ordered are listed, but only abnormal results are displayed) Labs Reviewed  CBC WITH DIFFERENTIAL/PLATELET - Abnormal; Notable for the following components:      Result Value   RBC 3.81 (*)    Hemoglobin 9.3 (*)    HCT 32.3 (*)    MCH 24.4 (*)    MCHC 28.8 (*)    RDW 17.6 (*)    All other components within normal limits  COMPREHENSIVE METABOLIC PANEL WITH GFR - Abnormal; Notable for the following components:   Glucose, Bld 118 (*)    Creatinine, Ser 1.01 (*)    Calcium  8.4 (*)    Albumin  2.6 (*)    All other components within normal limits  CULTURE, BLOOD (ROUTINE X 2)  CULTURE, BLOOD (ROUTINE X 2)  MRSA NEXT GEN BY PCR, NASAL  BRAIN NATRIURETIC PEPTIDE  LACTIC ACID, PLASMA  LACTIC ACID, PLASMA  BASIC METABOLIC PANEL WITH GFR  CBC  HIV ANTIBODY (ROUTINE TESTING W REFLEX)  URINALYSIS, ROUTINE W REFLEX MICROSCOPIC    EKG: None  Radiology: DG Chest 2 View Result Date: 11/05/2023 CLINICAL DATA:  Lower extremity edema EXAM: CHEST - 2 VIEW COMPARISON:  None Available. FINDINGS: Normal mediastinum and cardiac silhouette. Normal pulmonary vasculature. No evidence of effusion, infiltrate, or pneumothorax. No acute bony abnormality. IMPRESSION: No acute cardiopulmonary process. Electronically Signed   By: Jackquline Boxer M.D.   On: 11/05/2023 12:34     Procedures    EMERGENCY DEPARTMENT US  CARDIAC EXAM Study: Limited Ultrasound of the Heart and Pericardium  INDICATIONS:Abnormal vital signs Multiple views of the heart and pericardium were obtained in real-time with a multi-frequency probe.  PERFORMED AB:Fbdzoq IMAGES ARCHIVED?: No LIMITATIONS:  Body habitus VIEWS USED: Parasternal long axis and Parasternal short axis INTERPRETATION: Cardiac activity present, Pericardial effusion  present, Amount of pericardial effusion small, and Normal contractility   Medications Ordered in the ED  Chlorhexidine  Gluconate Cloth 2 % PADS 6 each (has no administration in time range)  aspirin  EC tablet 81 mg (has no administration in time range)  HYDROcodone -acetaminophen  (NORCO) 10-325 MG per tablet 1 tablet (has no administration in time range)  atorvastatin  (LIPITOR ) tablet 80 mg (80 mg Oral Given 11/05/23 1848)  dronedarone  (MULTAQ ) tablet 400 mg (has no administration in time range)  apixaban  (ELIQUIS ) tablet 5 mg (has no administration in time range)  gabapentin  (NEURONTIN ) capsule 200 mg (has no administration in time range)  acetaminophen  (TYLENOL ) tablet 650 mg (has no administration in time range)    Or  acetaminophen  (TYLENOL ) suppository 650 mg (has no administration in time range)  ondansetron  (ZOFRAN ) tablet 4 mg (has no administration in time range)    Or  ondansetron  (ZOFRAN ) injection 4 mg (has no administration in time range)  polyethylene glycol (MIRALAX / GLYCOLAX) packet 17 g (has no administration in time range)  cefTRIAXone  (ROCEPHIN ) 2 g in sodium chloride  0.9 % 100 mL IVPB (has no administration in time range)  albumin  human 25 % solution 25 g (0 g Intravenous Stopped 11/05/23 1813)  cefTRIAXone  (ROCEPHIN ) 1 g in sodium chloride  0.9 % 100 mL IVPB (1 g Intravenous New Bag/Given 11/05/23 1731)  cefTRIAXone  (ROCEPHIN ) 1 g in sodium chloride  0.9 % 100 mL IVPB (1 g Intravenous New Bag/Given 11/05/23 1856)                                    Medical Decision Making Amount and/or Complexity of Data Reviewed Labs: ordered. Radiology: ordered.  Risk Prescription drug management. Decision regarding hospitalization.   58 year old female with history of CHF, atrial fibrillation on Eliquis , and COPD who presents emergency department because of positive blood cultures and leg pain and swelling  Initial Ddx:  Contaminant, bacteremia, CHF, liver failure, ESRD, venous  stasis   MDM/Course:  Patient presents emergency department after being called about positive blood cultures.  Appears that she is growing Staph epidermidis in 1 of 2 bottles.  Does not have any cellulitis.  No other reasons that she would be bacteremic so I feel this is likely a contaminant.  On exam does appear to have significant lower extremity edema.  Had soft blood pressures that appear to be getting lower.  Of note, there was some difficulty obtaining accurate blood pressure on her due to habitus and they actually had to use a small cuff on her forearm.  Does have low albumin .  Will hold off on excessive crystalloids at this time to support her blood pressures.  Was given an albumin  bolus with some improvement of her blood pressures upon re-evaluation.  Lab work including CBC, chemistry, and BNP unremarkable.  Initial lactic acid WNL.  Chest x-ray without significant pulmonary edema.  Given her persistent hypotension we will admit to hospitalist for observation.  May benefit from formal echo as well.  This patient presents to the ED for concern of complaints listed in HPI, this involves an extensive number of treatment options, and is a complaint that carries with it a high risk of complications and morbidity. Disposition including potential need for admission considered.   Dispo: Admit  Records reviewed Outpatient Clinic Notes The following labs were independently interpreted: Chemistry and show no acute abnormality I independently reviewed the following imaging with scope of interpretation limited to determining acute life threatening conditions related to emergency care: Chest x-ray and agree with the radiologist interpretation with the following exceptions: none I personally reviewed and interpreted cardiac monitoring: normal sinus rhythm  I personally reviewed and interpreted the pt's EKG: see above for interpretation  I have reviewed the patients home medications and made adjustments as  needed Consults: Hospitalist  Portions of this note were generated with Scientist, clinical (histocompatibility and immunogenetics). Dictation errors may occur despite best attempts at proofreading.  Final diagnoses:  Hypotension, unspecified hypotension type  Lower extremity edema    ED Discharge Orders     None          Yolande Lamar BROCKS, MD 11/05/23 2008

## 2023-11-05 NOTE — Assessment & Plan Note (Signed)
 Stable.  No chest pain.  She is on aspirin  and Eliquis . -Resume aspirin , Eliquis , atorvastatin . - Hold metoprolol  with questionable blood pressure

## 2023-11-05 NOTE — Assessment & Plan Note (Signed)
 Stable and compensated.  Presenting with hypotension and cellulitis.  Last echo 2024 EF 55 to 60%, G1DD. - Hold Lasix , metoprolol , Entresto  for now

## 2023-11-05 NOTE — H&P (Signed)
 History and Physical    Kristine Montgomery FMW:[id number] DOB: 02-03-1966 DOA: 11/05/2023  PCP: Bertell Satterfield, MD   Patient coming from: Home  I have personally briefly reviewed patient's old medical records in [facility]  Chief Complaint: Positive blood cultures, leg swelling  HPI: Kristine Montgomery is a 58 y.o. female with medical history significant for congestive heart failure, coronary artery disease, COPD, rheumatoid arthritis.  Patient was in the ED 6/18 with complaints of bilateral lower extremity swelling, and low blood pressure.  Blood pressure cuff was adjusted, with improvement in blood pressure, she also received a fluid bolus.  Blood cultures were obtained, she was called to come to the ED today due for abnormal results.  Culture results today- 1 out of 2 bottles growing Staph epidermidis.  She reports ongoing bilateral lower extremity swelling that has persisted, but over the past week she has developed pain to bilateral lower extremities and pinkness to both legs.  Also reports intermittent pain with urination over the past 2 to 3 days.  No trauma, no wounds. No dizziness, no vomiting, no diarrhea, she has maintained good oral intake.  She has been compliant with her medications which include Eliquis , Lasix  20 mg daily.  No difficulty breathing, no cough, no fevers no chills, she is not on home O2.  She reports prior similar lower extremity symptoms a few months ago that was treated with ~5 days of antibiotics and resolved- ?? Name.   ED Course: Temperature 98.3.  Heart rate 60s to 81.  Respiratory 23.  Blood pressure systolic down to 26/46- ??  Accuracy as ED staff were having a problem with the blood pressure cuff in the ED. O2 sats down to 74% on room air, placed on 2 L. Lactic acid 1.2 >>1.1. WBC 8.8. Chest x-ray clear. BNP 91. 1 g IV ceftriaxone  given.  25 g albumin  solution given. Hospitalist to admit for hypotension and cellulitis.  Review of Systems: As per HPI  all other systems reviewed and negative.  Past Medical History:  Diagnosis Date   Acute combined systolic and diastolic CHF, NYHA class 4 (HCC) 07/29/2020   a. EF 30% by echo in 07/2020 b. EF 55-60% by echo in 11/2020   Arthritis    Asthma    Atrial fibrillation (HCC)    CAD (coronary artery disease)    a. 07/2020: s/p NSTEMI with DES to proxLAD and DES to proxLCx with medical management recommended of the occluded RCA.   Chronic bronchitis (HCC)    COPD (chronic obstructive pulmonary disease) (HCC)    Edema extremities    History of hiatal hernia    Hyperlipidemia LDL goal <70 07/29/2020   Ovarian cyst    Pneumonia    Rheumatoid arthritis (HCC)     Past Surgical History:  Procedure Laterality Date   CESAREAN SECTION     CORONARY STENT INTERVENTION N/A 07/29/2020   Procedure: CORONARY STENT INTERVENTION;  Surgeon: Burnard Debby LABOR, MD;  Location: [facility];  Service: Cardiovascular;  Laterality: N/A;   KNEE ARTHROSCOPY WITH MEDIAL MENISECTOMY Right 05/23/2020   Procedure: KNEE ARTHROSCOPY WITH MEDIAL AND LATERAL MENISECTOMY; 3 COMPARTMENT SYNOVECTOMY;  Surgeon: Margrette Taft BRAVO, MD;  Location: [facility];  Service: Orthopedics;  Laterality: Right;   RIGHT/LEFT HEART CATH AND CORONARY ANGIOGRAPHY N/A 07/29/2020   Procedure: RIGHT/LEFT HEART CATH AND CORONARY ANGIOGRAPHY;  Surgeon: Burnard Debby LABOR, MD;  Location: [facility];  Service: Cardiovascular;  Laterality: N/A;   TONSILLECTOMY  TUBAL LIGATION       reports that she quit smoking about 3 years ago. Her smoking use included cigarettes. She started smoking about 33 years ago. She has a 15 pack-year smoking history. She has been exposed to tobacco smoke. She has never used smokeless tobacco. She reports that she does not currently use alcohol. She reports that she does not use drugs.  No Known Allergies  Family History  Problem Relation Age of Onset   Cancer Paternal Grandfather        colon   Cancer Maternal  Grandmother        lung   COPD Father    Non-Hodgkin's lymphoma Mother    Diabetes Mother    Cancer Mother        cervical   Congestive Heart Failure Mother    Hypertension Sister    Other Son        fluid around heart    Prior to Admission medications   Medication Sig Start Date End Date Taking? Authorizing Provider  atorvastatin  (LIPITOR ) 80 MG tablet Take 1 tablet (80 mg total) by mouth daily. 08/11/23  Yes Okey Vina GAILS, MD  dapagliflozin  propanediol (FARXIGA ) 10 MG TABS tablet Take 1 tablet (10 mg total) by mouth daily. 05/09/23  Yes Okey Vina GAILS, MD  ELIQUIS  5 MG TABS tablet TAKE ONE TABLET BY MOUTH 2 TIMES A DAY 07/06/23  Yes Okey Vina GAILS, MD  famotidine  (PEPCID ) 40 MG tablet TAKE ONE TABLET BY MOUTH IN THE EVENING. 11/24/22  Yes Okey Vina GAILS, MD  furosemide  (LASIX ) 20 MG tablet Take 20 mg by mouth daily. 10/06/23  Yes [provider]  gabapentin  (NEURONTIN ) 100 MG capsule Take 2 capsules (200 mg total) by mouth 2 (two) times daily. 11/02/23  Yes Idol, Julie, PA-C  HYDROcodone -acetaminophen  (NORCO) 10-325 MG tablet Take 1 tablet by mouth every 6 (six) hours as needed for moderate pain (pain score 4-6).   Yes [provider]  metoprolol  succinate (TOPROL -XL) 25 MG 24 hr tablet Take 0.5 tablets (12.5 mg total) by mouth daily. 08/11/23  Yes Okey Vina GAILS, MD  Oxycodone  HCl 10 MG TABS Take 1 tablet (10 mg total) by mouth 4 (four) times daily. 11/02/23  Yes Idol, Mliss, PA-C  sacubitril -valsartan  (ENTRESTO ) 24-26 MG Take 1 tablet by mouth 2 (two) times daily. 10/05/23  Yes Okey Vina GAILS, MD  aspirin  EC 81 MG tablet Take 81 mg by mouth daily. Swallow whole.    [provider]  dronedarone  (MULTAQ ) 400 MG tablet Take 1 tablet (400 mg total) by mouth 2 (two) times daily with a meal. 06/14/23   Okey Vina GAILS, MD  nitroGLYCERIN  (NITROSTAT ) 0.4 MG SL tablet Place 0.4 mg under the tongue every 5 (five) minutes as needed for chest pain.    [provider]     Physical Exam: Vitals:   11/05/23 1615 11/05/23 1630 11/05/23 1645 11/05/23 1700  BP: (!) 97/55 (!) 93/54 (!) 94/51 (!) 100/50  Pulse: 68 64 66 66  Resp:      Temp:      TempSrc:      SpO2: 96% 99% 97% 95%  Weight:      Height:        Constitutional: Small adult sized blood pressure cuff on patient's forearm, NAD, calm, comfortable Vitals:   11/05/23 1615 11/05/23 1630 11/05/23 1645 11/05/23 1700  BP: (!) 97/55 (!) 93/54 (!) 94/51 (!) 100/50  Pulse: 68 64 66 66  Resp:  Temp:      TempSrc:      SpO2: 96% 99% 97% 95%  Weight:      Height:       Eyes: PERRL, lids and conjunctivae normal ENMT: Mucous membranes are moist.  Neck: normal, supple, no masses, no thyromegaly Respiratory: clear to auscultation bilaterally, no wheezing, no crackles. Normal respiratory effort. No accessory muscle use.  Cardiovascular: Regular rate and rhythm, no murmurs / rubs / gallops.  Trace to 1+ pitting bilateral lower extremity edema, with erythema, and tenderness with differential warmth.   Abdomen: no tenderness, no masses palpated. No hepatosplenomegaly. Bowel sounds positive.  Musculoskeletal: no clubbing / cyanosis. No joint deformity upper and lower extremities. Good ROM, no contractures. Normal muscle tone.  Skin: Erythema, tenderness and differential warmth to bilateral lower extremities, no rashes, lesions, ulcers. No induration Neurologic: No facial asymmetry, moving extremities spontaneously speech fluent Psychiatric: Normal judgment and insight. Alert and oriented x 3. Normal mood.     Labs on Admission: I have personally reviewed following labs and imaging studies  CBC: Recent Labs  Lab 11/02/23 1601 11/05/23 1304  WBC 8.9 8.8  NEUTROABS 6.0 5.9  HGB 9.4* 9.3*  HCT 31.1* 32.3*  MCV 84.5 84.8  PLT 245 264   Basic Metabolic Panel: Recent Labs  Lab 11/02/23 1601 11/05/23 1304  NA 141 141  K 3.8 3.6  CL 106 107  CO2 24 24  GLUCOSE 100* 118*  BUN 15 16   CREATININE 0.78 1.01*  CALCIUM  8.7* 8.4*   GFR: Estimated Creatinine Clearance: 75.5 mL/min (A) (by C-G formula based on SCr of 1.01 mg/dL (H)). Liver Function Tests: Recent Labs  Lab 11/02/23 1601 11/05/23 1304  AST 16 17  ALT 15 11  ALKPHOS 78 71  BILITOT 0.7 1.0  PROT 6.8 7.2  ALBUMIN  2.6* 2.6*   Radiological Exams on Admission: DG Chest 2 View Result Date: 11/05/2023 CLINICAL DATA:  Lower extremity edema EXAM: CHEST - 2 VIEW COMPARISON:  None Available. FINDINGS: Normal mediastinum and cardiac silhouette. Normal pulmonary vasculature. No evidence of effusion, infiltrate, or pneumothorax. No acute bony abnormality. IMPRESSION: No acute cardiopulmonary process. Electronically Signed   By: Jackquline Boxer M.D.   On: 11/05/2023 12:34   EKG: None  Assessment/Plan Principal Problem:   Hypotension Active Problems:   Coronary artery disease involving native coronary artery of native heart with unstable angina pectoris (HCC)   Ischemic cardiomyopathy   Paroxysmal atrial fibrillation (HCC)   Chronic combined systolic and diastolic CHF (congestive heart failure) (HCC)   Assessment and Plan: * Hypotension Systolic down to 27/46 in the ED. Asymptomatic without dizziness.  Denies GI losses, and has maintained good oral intake.  She is on low dose- Lasix  20 mg daily.  Due to difficulty getting patient's blood pressure, a small adult blood pressure cuff was used and was placed on patient's forearm. Hence ??accuracy.  25 g of 25% albumin  solution already given in ED with improvement in blood pressure. -Hold further fluids for now -Hold metoprolol , Entresto , Lasix  20 mg for now  Cellulitis Bilateral lower extremity edema, tenderness, erythema, differential warmth.  Rules out for sepsis.  Afebrile without leukocytosis.  WBC 8.8.  Normal lactic acid 1.2.  Reports compliance with Eliquis  and Lasix . -IV ceftriaxone  2 g daily -Blood cultures 6/18- 1/2 bottles growing Staph epidermidis-  likely contaminant.  Follow-up repeat blood cultures drawn in ED today -Bilateral lower extremity venous Dopplers  Coronary artery disease involving native coronary artery of native heart with  unstable angina pectoris (HCC) Stable.  No chest pain.  She is on aspirin  and Eliquis . -Resume aspirin , Eliquis , atorvastatin . - Hold metoprolol  with questionable blood pressure  Chronic combined systolic and diastolic CHF (congestive heart failure) (HCC) Stable and compensated.  Presenting with hypotension and cellulitis.  Last echo 2024 EF 55 to 60%, G1DD. - Hold Lasix , metoprolol , Entresto  for now  Paroxysmal atrial fibrillation (HCC) Rate controlled and on anticoagulation with Eliquis . - Resume Eliquis  - Hole metoprolol  for hypotension   Hypoxia-O2 sats down to 74% on room air.  Improved on 2 L sats greater than 92%.  Patient denies respiratory symptoms, no chest pain.  Not on CPAP or BiPAP at home.  Quit smoking cigarettes about 5 years ago.  Lung exam unremarkable. Compliant with Eliquis .  Chest x-ray unremarkable. -Monitor for now  DVT prophylaxis: Eliquis  Code Status: FULL Code Family Communication: None at bedside Disposition Plan: ~/> 2 days Consults called: None Admission status: Inpt Stepdown I certify that at the point of admission it is my clinical judgment that the patient will require inpatient hospital care spanning beyond 2 midnights from the point of admission due to high intensity of service, high risk for further deterioration and high frequency of surveillance required.   Author: Tully FORBES Carwin, MD 11/05/2023 6:33 PM  For on call review [email or web address].

## 2023-11-06 ENCOUNTER — Inpatient Hospital Stay (HOSPITAL_COMMUNITY)

## 2023-11-06 DIAGNOSIS — I480 Paroxysmal atrial fibrillation: Secondary | ICD-10-CM | POA: Diagnosis not present

## 2023-11-06 DIAGNOSIS — E66813 Obesity, class 3: Secondary | ICD-10-CM

## 2023-11-06 DIAGNOSIS — I25110 Atherosclerotic heart disease of native coronary artery with unstable angina pectoris: Secondary | ICD-10-CM | POA: Diagnosis not present

## 2023-11-06 DIAGNOSIS — I255 Ischemic cardiomyopathy: Secondary | ICD-10-CM

## 2023-11-06 DIAGNOSIS — I5042 Chronic combined systolic (congestive) and diastolic (congestive) heart failure: Secondary | ICD-10-CM

## 2023-11-06 DIAGNOSIS — I959 Hypotension, unspecified: Secondary | ICD-10-CM

## 2023-11-06 DIAGNOSIS — R600 Localized edema: Secondary | ICD-10-CM

## 2023-11-06 LAB — CBC
HCT: 25.6 % — ABNORMAL LOW (ref 36.0–46.0)
Hemoglobin: 7.4 g/dL — ABNORMAL LOW (ref 12.0–15.0)
MCH: 24.4 pg — ABNORMAL LOW (ref 26.0–34.0)
MCHC: 28.9 g/dL — ABNORMAL LOW (ref 30.0–36.0)
MCV: 84.5 fL (ref 80.0–100.0)
Platelets: 218 10*3/uL (ref 150–400)
RBC: 3.03 MIL/uL — ABNORMAL LOW (ref 3.87–5.11)
RDW: 17.5 % — ABNORMAL HIGH (ref 11.5–15.5)
WBC: 7.5 10*3/uL (ref 4.0–10.5)
nRBC: 0 % (ref 0.0–0.2)

## 2023-11-06 LAB — URINALYSIS, ROUTINE W REFLEX MICROSCOPIC
Bilirubin Urine: NEGATIVE
Glucose, UA: >=500 mg/dL — AB
Hgb urine dipstick: NEGATIVE
Ketones, ur: NEGATIVE mg/dL
Nitrite: NEGATIVE
Protein, ur: NEGATIVE mg/dL
Specific Gravity, Urine: 1.027 (ref 1.005–1.030)
WBC, UA: >50 WBC/hpf (ref 0–5)
pH: 5 (ref 5.0–8.0)

## 2023-11-06 LAB — BASIC METABOLIC PANEL WITH GFR
Anion gap: 7 (ref 5–15)
BUN: 14 mg/dL (ref 6–20)
CO2: 26 mmol/L (ref 22–32)
Calcium: 8 mg/dL — ABNORMAL LOW (ref 8.9–10.3)
Chloride: 108 mmol/L (ref 98–111)
Creatinine, Ser: 0.8 mg/dL (ref 0.44–1.00)
GFR, Estimated: >60 mL/min (ref >60)
Glucose, Bld: 126 mg/dL — ABNORMAL HIGH (ref 70–99)
Potassium: 3.2 mmol/L — ABNORMAL LOW (ref 3.5–5.1)
Sodium: 141 mmol/L (ref 135–145)

## 2023-11-06 LAB — HIV ANTIBODY (ROUTINE TESTING W REFLEX): HIV Screen 4th Generation wRfx: NONREACTIVE

## 2023-11-06 MED ORDER — MAGNESIUM SULFATE IN D5W 1-5 GM/100ML-% IV SOLN
1.0000 g | Freq: Once | INTRAVENOUS | Status: AC
  Administered 2023-11-06: 1 g via INTRAVENOUS
  Filled 2023-11-06: qty 100

## 2023-11-06 MED ORDER — POTASSIUM CHLORIDE CRYS ER 20 MEQ PO TBCR
40.0000 meq | EXTENDED_RELEASE_TABLET | Freq: Once | ORAL | Status: AC
  Administered 2023-11-06: 40 meq via ORAL
  Filled 2023-11-06: qty 2

## 2023-11-06 MED ORDER — HYDROXYZINE HCL 25 MG PO TABS
25.0000 mg | ORAL_TABLET | Freq: Three times a day (TID) | ORAL | Status: DC | PRN
  Administered 2023-11-06 – 2023-11-10 (×3): 25 mg via ORAL
  Filled 2023-11-06 (×3): qty 1

## 2023-11-06 NOTE — Progress Notes (Signed)
   11/06/23 1829  TOC Brief Assessment  Insurance and Status Reviewed  Patient has primary care physician Yes  Home environment has been reviewed From home  Prior level of function: Assisted  Prior/Current Home Services Current home services (aide)  Social Drivers of Health Review SDOH reviewed no interventions necessary  Readmission risk has been reviewed Yes  Transition of care needs no transition of care needs at this time   Transition of Care Department [facility]) has reviewed patient and no TOC needs have been identified at this time. We will continue to monitor patient advancement through interdisciplinary progression rounds. If new patient transition needs arise, please place a TOC consult.

## 2023-11-06 NOTE — Progress Notes (Signed)
 Progress Note   Patient: Kristine Montgomery FMW:[id number] DOB: 1965/06/16 DOA: 11/05/2023     1 DOS: the patient was seen and examined on 11/06/2023   Brief hospital admission narrative: As per H&P written by Dr. Pearlean on 11/05/2023 Kristine Montgomery is a 58 y.o. female with medical history significant for congestive heart failure, coronary artery disease, COPD, rheumatoid arthritis.   Patient was in the ED 6/18 with complaints of bilateral lower extremity swelling, and low blood pressure.  Blood pressure cuff was adjusted, with improvement in blood pressure, she also received a fluid bolus.  Blood cultures were obtained, she was called to come to the ED today due for abnormal results.   Culture results today- 1 out of 2 bottles growing Staph epidermidis.  She reports ongoing bilateral lower extremity swelling that has persisted, but over the past week she has developed pain to bilateral lower extremities and pinkness to both legs.  Also reports intermittent pain with urination over the past 2 to 3 days.  No trauma, no wounds. No dizziness, no vomiting, no diarrhea, she has maintained good oral intake.  She has been compliant with her medications which include Eliquis , Lasix  20 mg daily.  No difficulty breathing, no cough, no fevers no chills, she is not on home O2.  She reports prior similar lower extremity symptoms a few months ago that was treated with ~5 days of antibiotics and resolved- ?? Name.    ED Course: Temperature 98.3.  Heart rate 60s to 81.  Respiratory 23.  Blood pressure systolic down to 26/46- ??  Accuracy as ED staff were having a problem with the blood pressure cuff in the ED. O2 sats down to 74% on room air, placed on 2 L. Lactic acid 1.2 >>1.1. WBC 8.8. Chest x-ray clear. BNP 91. 1 g IV ceftriaxone  given.  25 g albumin  solution given. Hospitalist to admit for hypotension and cellulitis.  Assessment and plan Chronic combined systolic and diastolic CHF (congestive heart  failure) (HCC) Stable and compensated.  Presenting with hypotension and cellulitis.  Last echo 2024 EF 55 to 60%, G1DD. - In the setting of the blood pressure will continue holding diuretics and GDMT medications - Follow daily weights, strict I's and O's, low-sodium diet  Coronary artery disease involving native coronary artery of native heart with unstable angina pectoris (HCC) Stable.  No chest pain.   - Continue aspirin , statin and Eliquis  - Anticipating resumption of metoprolol  on 11/07/2023 if he remains stable.  Paroxysmal atrial fibrillation (HCC) Continue Eliquis  for secondary prevention - Continue telemetry monitoring - Follow electrolytes maintain potassium above 4 magnesium above 2 as much as possible.  Hypokalemia - Continue to follow ultralights and replete as needed.  Cellulitis -Bilateral lower extremity edema, tenderness, erythema, differential warmth.  Rules out for sepsis.   - No fever or normal WBCs appreciated - Lower extremity Dopplers ruling out the presence of blood clots - Most likely component of venous insufficiency - Discussed with patient's the importance of low-sodium diet, leg elevation and the use of compression stockings. - Continue current antibiotics and follow clinical response.  Hypotension Systolic down to 27/46 in the ED. Asymptomatic without dizziness.  Denies GI losses, and has maintained good oral intake.  She is on low dose- Lasix  20 mg daily.  Due to difficulty getting patient's blood pressure, a small adult blood pressure cuff was used and was placed on patient's forearm. Hence ??accuracy.  25 g of 25% albumin  solution already given in ED with  improvement in blood pressure. - Continue to hold fluid resuscitation - Overall vital signs stable - For the most part is stable MAP and no complaints of lightheadedness or dizziness - Will continue holding for another 24 hours GDMT meds and diuretics.  Morbid obesity -Body mass index is 54.07  kg/m. -Low-calorie diet and portion control discussed with patient.  Decreased hemoglobin - Appears to be hemodilution - No overt bleeding appreciated - Continue to follow hemoglobin trend.   Subjective:  Afebrile, no chest pain, no nausea vomiting.  Complaining of lower extremity pain.  Physical Exam: Vitals:   11/06/23 0600 11/06/23 0700 11/06/23 1100 11/06/23 1602  BP: (!) 101/55 (!) 102/53 125/64   Pulse: 64 66 69   Resp: 15 16 14    Temp:  98.7 F (37.1 C) 99.9 F (37.7 C) 98.2 F (36.8 C)  TempSrc:   Axillary Oral  SpO2: 100% 97% 98%   Weight:      Height:       General exam: Alert, awake, oriented x 3; in no major distress. Respiratory system: No crackles, no wheezing.  2 L supplementation in place for comfort.  Patient demonstrating good saturation. Cardiovascular system: Rate controlled, no rubs, no gallops, no JVD on exam. Gastrointestinal system: Abdomen is obese, nondistended, soft and nontender. No organomegaly or masses felt. Normal bowel sounds heard. Central nervous system: Moving 4 limbs spontaneously.  No focal neurological deficits. Extremities: No cyanosis or clubbing; improved erythematous changes appreciated bilaterally.  Trace to 1+ edema seen on exam.  Patient expressed tenderness on palpation. Skin: No AKI or open wounds. Psychiatry: Judgement and insight appear normal. Mood & affect appropriate.    Data Reviewed: Basic metabolic panel: Sodium 141, potassium 3.2, chloride 108, bicarb 26, BUN 14, creatinine 0.80 and GFR >60 CBC: WBC 7.5, hemoglobin 7.4 and platelet counts 218K  Family Communication: No family at bedside.  Disposition: Status is: Inpatient Remains inpatient appropriate because: Continue IV antibiotics.  Anticipating discharge back home once medically stable.  Time spent: 50 minutes  Author: Eric Nunnery, MD 11/06/2023 4:36 PM  For on call review [email or web address].

## 2023-11-07 DIAGNOSIS — I25110 Atherosclerotic heart disease of native coronary artery with unstable angina pectoris: Secondary | ICD-10-CM | POA: Diagnosis not present

## 2023-11-07 DIAGNOSIS — I480 Paroxysmal atrial fibrillation: Secondary | ICD-10-CM | POA: Diagnosis not present

## 2023-11-07 DIAGNOSIS — I959 Hypotension, unspecified: Secondary | ICD-10-CM | POA: Diagnosis not present

## 2023-11-07 DIAGNOSIS — I5042 Chronic combined systolic (congestive) and diastolic (congestive) heart failure: Secondary | ICD-10-CM | POA: Diagnosis not present

## 2023-11-07 LAB — CBC
HCT: 27.6 % — ABNORMAL LOW (ref 36.0–46.0)
Hemoglobin: 8 g/dL — ABNORMAL LOW (ref 12.0–15.0)
MCH: 25.3 pg — ABNORMAL LOW (ref 26.0–34.0)
MCHC: 29 g/dL — ABNORMAL LOW (ref 30.0–36.0)
MCV: 87.3 fL (ref 80.0–100.0)
Platelets: 273 10*3/uL (ref 150–400)
RBC: 3.16 MIL/uL — ABNORMAL LOW (ref 3.87–5.11)
RDW: 17.6 % — ABNORMAL HIGH (ref 11.5–15.5)
WBC: 6.7 10*3/uL (ref 4.0–10.5)
nRBC: 0 % (ref 0.0–0.2)

## 2023-11-07 LAB — CULTURE, BLOOD (ROUTINE X 2)
Culture: NO GROWTH
Special Requests: ADEQUATE

## 2023-11-07 LAB — BASIC METABOLIC PANEL WITH GFR
Anion gap: 7 (ref 5–15)
BUN: 14 mg/dL (ref 6–20)
CO2: 26 mmol/L (ref 22–32)
Calcium: 8.2 mg/dL — ABNORMAL LOW (ref 8.9–10.3)
Chloride: 107 mmol/L (ref 98–111)
Creatinine, Ser: 0.7 mg/dL (ref 0.44–1.00)
GFR, Estimated: >60 mL/min (ref >60)
Glucose, Bld: 102 mg/dL — ABNORMAL HIGH (ref 70–99)
Potassium: 3.9 mmol/L (ref 3.5–5.1)
Sodium: 140 mmol/L (ref 135–145)

## 2023-11-07 LAB — MAGNESIUM: Magnesium: 2.4 mg/dL (ref 1.7–2.4)

## 2023-11-07 LAB — URIC ACID: Uric Acid, Serum: 5.1 mg/dL (ref 2.5–7.1)

## 2023-11-07 MED ORDER — MIDODRINE HCL 5 MG PO TABS
2.5000 mg | ORAL_TABLET | Freq: Three times a day (TID) | ORAL | Status: DC
  Administered 2023-11-07 – 2023-11-10 (×10): 2.5 mg via ORAL
  Filled 2023-11-07 (×10): qty 1

## 2023-11-07 NOTE — Progress Notes (Addendum)
 Nurse at bedside,Patient alert and oriented times four.Blood pressure was 95/56,heart rate 81,Dr Madera notified. Plan of care on going.

## 2023-11-07 NOTE — Progress Notes (Signed)
 Progress Note   Patient: [name] FMW:[id number] DOB: [birth year] DOA: 11/05/2023     2 DOS: the patient was seen and examined on 11/07/2023   Brief hospital admission narrative: As per H&P written by Dr. Pearlean on 11/05/2023 Kristine Montgomery is a 58 y.o. female with medical history significant for congestive heart failure, coronary artery disease, COPD, rheumatoid arthritis.   Patient was in the ED 6/18 with complaints of bilateral lower extremity swelling, and low blood pressure.  Blood pressure cuff was adjusted, with improvement in blood pressure, she also received a fluid bolus.  Blood cultures were obtained, she was called to come to the ED today due for abnormal results.   Culture results today- 1 out of 2 bottles growing Staph epidermidis.  She reports ongoing bilateral lower extremity swelling that has persisted, but over the past week she has developed pain to bilateral lower extremities and pinkness to both legs.  Also reports intermittent pain with urination over the past 2 to 3 days.  No trauma, no wounds. No dizziness, no vomiting, no diarrhea, she has maintained good oral intake.  She has been compliant with her medications which include Eliquis , Lasix  20 mg daily.  No difficulty breathing, no cough, no fevers no chills, she is not on home O2.  She reports prior similar lower extremity symptoms a few months ago that was treated with ~5 days of antibiotics and resolved- ?? Name.    ED Course: Temperature 98.3.  Heart rate 60s to 81.  Respiratory 23.  Blood pressure systolic down to 26/46- ??  Accuracy as ED staff were having a problem with the blood pressure cuff in the ED. O2 sats down to 74% on room air, placed on 2 L. Lactic acid 1.2 >>1.1. WBC 8.8. Chest x-ray clear. BNP 91. 1 g IV ceftriaxone  given.  25 g albumin  solution given. Hospitalist to admit for hypotension and cellulitis.  Assessment and plan Chronic combined systolic and diastolic CHF (congestive heart  failure) (HCC) Stable and compensated.  Presenting with hypotension and cellulitis.  Last echo 2024 EF 55 to 60%, G1DD. - In the setting of the blood pressure will continue holding diuretics and GDMT medications for another 24 hours - Low-dose midodrine 3 times a day has been started. - Continue to follow daily weights, strict I's and O's, low-sodium diet  Coronary artery disease involving native coronary artery of native heart with unstable angina pectoris (HCC) Stable.  No chest pain.   - Continue aspirin , statin and Eliquis  - Anticipating resumption of metoprolol  on 11/08/2023 as blood pressure is still soft.  Paroxysmal atrial fibrillation (HCC) Continue Eliquis  for secondary prevention - Continue telemetry monitoring - Follow electrolytes maintain potassium above 4 magnesium above 2 as much as possible.  Hypokalemia - Electrolytes repleted and currently stable - Continue to follow trend.  Cellulitis -Bilateral lower extremity edema, tenderness, erythema, differential warmth.  Rules out for sepsis.   - No fever or normal WBCs appreciated - Lower extremity Dopplers ruling out the presence of blood clots - Most likely component of venous insufficiency - Discussed with patient's the importance of low-sodium diet, leg elevation and the use of compression stockings.  TED hoses has been initiated. - Continue current antibiotics and follow clinical response.  Hypotension Systolic down to 27/46 in the ED. Asymptomatic without dizziness.  Denies GI losses, and has maintained good oral intake.  She is on low dose- Lasix  20 mg daily.  Due to difficulty getting patient's blood pressure, a small  adult blood pressure cuff was used and was placed on patient's forearm. Hence ??accuracy.  25 g of 25% albumin  solution already given in ED with improvement in blood pressure. - Continue to hold fluid resuscitation - Overall vital signs stable - For the most part is stable MAP and no complaints of  lightheadedness or dizziness - Will start low-dose midodrine 3 times a day with anticipation of resuming home GDMT management on 11/08/2023.  Morbid obesity -Body mass index is 54.07 kg/m. -Low-calorie diet and portion control discussed with patient.  Decreased hemoglobin - Appears to be hemodilution - No overt bleeding appreciated - Hemoglobin trending up - Continue to follow trend intermittently.   Subjective:  No fever, no chest pain, no complaints of shortness of breath.  Blood pressure remains soft but stable.  Patient complaining of pain in her legs bilaterally.  Physical Exam: Vitals:   11/07/23 0151 11/07/23 0408 11/07/23 0929 11/07/23 1319  BP: (!) 110/59 104/60 (!) 95/56 (!) 98/45  Pulse: 77 79 81 90  Resp: 19 18 18    Temp: 98 F (36.7 C) 98.6 F (37 C) 99.6 F (37.6 C) 99 F (37.2 C)  TempSrc: Oral Oral Oral Oral  SpO2: 92% 94% 93% 90%  Weight:      Height:       General exam: Alert, awake, oriented x 3; denying chest pain and shortness of breath.  Complaining of bilateral lower extremity pain. Respiratory system: No wheezing or crackles on exam; no using accessory muscles. Cardiovascular system: Rate controlled, no rubs, no gallops, no JVD. Gastrointestinal system: Abdomen is obese, nondistended, soft and nontender. No organomegaly or masses felt. Normal bowel sounds heard. Central nervous system: No focal neurological deficits. Extremities: No cyanosis or clubbing; 1+ edema appreciated bilaterally.  Patient reporting bilateral pain affecting posterior aspect of her calf and ankles. Skin: Lower extremities erythematous changes and cellulitic process almost completely resolved.  No petechiae or open wounds appreciated. Psychiatry: Judgement and insight appear normal. Mood & affect appropriate.   Data Reviewed: Uric acid 5.1 Magnesium: 2.4 CBC: WBC 6.7, hemoglobin 9.0 and platelet count 273K Basic metabolic panel: Sodium 140, potassium 3.9, chloride 107, bicarb  26, BUN 14, creatinine 0.70 and GFR >60   Family Communication: No family at bedside.  Disposition: Status is: Inpatient Remains inpatient appropriate because: Continue IV antibiotics.  Anticipating discharge back home once medically stable.  Time spent: 50 minutes  Author: Eric Nunnery, MD 11/07/2023 4:34 PM  For on call review [email or web address].

## 2023-11-07 NOTE — Progress Notes (Addendum)
 Nurse at bedside,New IV started to left hand 22 gauge times two attempts,patient tolerated procedure well.Plan of care on going.

## 2023-11-07 NOTE — Progress Notes (Signed)
 Mobility Specialist Progress Note:    11/07/23 1440  Mobility  Activity Stood at bedside;Dangled on edge of bed  Level of Assistance Maximum assist, patient does 25-49%  Assistive Device Front wheel walker  Range of Motion/Exercises Active;All extremities  Activity Response Tolerated well  Mobility Referral Yes  Mobility visit 1 Mobility  Mobility Specialist Start Time (ACUTE ONLY) 1410  Mobility Specialist Stop Time (ACUTE ONLY) 1438  Mobility Specialist Time Calculation (min) (ACUTE ONLY) 28 min   Pt received in bed, agreeable to mobility. Required MaxA to complete a partial STS with RW. Tolerated well, c/o left foot pain. Left pt sitting EOB with NT, alarm on. All needs met.   Sherrilee Ditty Mobility Specialist Please contact via Special educational needs teacher or  Rehab office at [phone]

## 2023-11-07 NOTE — Progress Notes (Signed)
 Nurse at bedside,patient c/o severe pain to left ankle when standing with front wheel walker,rated a 10,sharp and constant, Hydrocodone -acetaminophen  one tablet give by mouth for pain per MAR prn.Dr Ricky notified.Plan of care on going.

## 2023-11-07 NOTE — Plan of Care (Signed)
  Problem: Education: Goal: Knowledge of General Education information will improve Description: Including pain rating scale, medication(s)/side effects and non-pharmacologic comfort measures Outcome: Progressing   Problem: Health Behavior/Discharge Planning: Goal: Ability to manage health-related needs will improve Outcome: Progressing   Problem: Clinical Measurements: Goal: Ability to maintain clinical measurements within normal limits will improve Outcome: Progressing Goal: Will remain free from infection Outcome: Progressing   Problem: Activity: Goal: Risk for activity intolerance will decrease Outcome: Progressing   Problem: Pain Managment: Goal: General experience of comfort will improve and/or be controlled Outcome: Progressing   Problem: Safety: Goal: Ability to remain free from injury will improve Outcome: Progressing   Problem: Skin Integrity: Goal: Risk for impaired skin integrity will decrease Outcome: Progressing

## 2023-11-07 NOTE — Plan of Care (Signed)
   Problem: Education: Goal: Knowledge of General Education information will improve Description Including pain rating scale, medication(s)/side effects and non-pharmacologic comfort measures Outcome: Progressing   Problem: Education: Goal: Knowledge of General Education information will improve Description Including pain rating scale, medication(s)/side effects and non-pharmacologic comfort measures Outcome: Progressing

## 2023-11-08 ENCOUNTER — Telehealth: Payer: Self-pay | Admitting: Pharmacy Technician

## 2023-11-08 DIAGNOSIS — I480 Paroxysmal atrial fibrillation: Secondary | ICD-10-CM | POA: Diagnosis not present

## 2023-11-08 DIAGNOSIS — I25110 Atherosclerotic heart disease of native coronary artery with unstable angina pectoris: Secondary | ICD-10-CM | POA: Diagnosis not present

## 2023-11-08 DIAGNOSIS — I959 Hypotension, unspecified: Secondary | ICD-10-CM | POA: Diagnosis not present

## 2023-11-08 DIAGNOSIS — I5042 Chronic combined systolic (congestive) and diastolic (congestive) heart failure: Secondary | ICD-10-CM | POA: Diagnosis not present

## 2023-11-08 MED ORDER — METOPROLOL SUCCINATE ER 25 MG PO TB24
12.5000 mg | ORAL_TABLET | Freq: Every day | ORAL | Status: DC
  Administered 2023-11-08 – 2023-11-10 (×3): 12.5 mg via ORAL
  Filled 2023-11-08 (×3): qty 1

## 2023-11-08 MED ORDER — DOXYCYCLINE HYCLATE 100 MG PO TABS
100.0000 mg | ORAL_TABLET | Freq: Two times a day (BID) | ORAL | Status: DC
  Administered 2023-11-08 – 2023-11-10 (×5): 100 mg via ORAL
  Filled 2023-11-08 (×5): qty 1

## 2023-11-08 MED ORDER — FUROSEMIDE 20 MG PO TABS
20.0000 mg | ORAL_TABLET | Freq: Every day | ORAL | Status: DC
  Administered 2023-11-08 – 2023-11-10 (×3): 20 mg via ORAL
  Filled 2023-11-08 (×3): qty 1

## 2023-11-08 NOTE — Telephone Encounter (Signed)
 Received notification from [employer] that patient is due a shipment and the shipment will arrive within 10 business day. Notification attached in media

## 2023-11-08 NOTE — Evaluation (Signed)
 Physical Therapy Evaluation Patient Details Name: Kristine Montgomery MRN: [id number] DOB: [birth year] Today's Date: 11/08/2023  History of Present Illness  Kristine Montgomery is a 58 y.o. female with medical history significant for congestive heart failure, coronary artery disease, COPD, rheumatoid arthritis.     Patient was in the ED 6/18 with complaints of bilateral lower extremity swelling, and low blood pressure.  Blood pressure cuff was adjusted, with improvement in blood pressure, she also received a fluid bolus.  Blood cultures were obtained, she was called to come to the ED today due for abnormal results.     Culture results today- 1 out of 2 bottles growing Staph epidermidis.  She reports ongoing bilateral lower extremity swelling that has persisted, but over the past week she has developed pain to bilateral lower extremities and pinkness to both legs.  Also reports intermittent pain with urination over the past 2 to 3 days.  No trauma, no wounds.  No dizziness, no vomiting, no diarrhea, she has maintained good oral intake.  She has been compliant with her medications which include Eliquis , Lasix  20 mg daily.  No difficulty breathing, no cough, no fevers no chills, she is not on home O2.  She reports prior similar lower extremity symptoms a few months ago that was treated with ~5 days of antibiotics and resolved- ?? Name.   Clinical Impression  Patient limited this date due to pain on plantar surface of feet bilaterally. Patient was received supine in bed and agreeable to PT session. BP reading: 108/62. Mod/Max A for supine<>sit. BP reading: 142/75. Unable to fully stand this date due to pain, attempted with assist with gait belt, elevating bed height, and using sheet under patient. Patient brought to tears due to pain. Returned patient to bed. Once supine, patient able to boost up in bed with assist of bed railing and trendelenburg bed positioning. Bed alarm set and nurse notified afterwards. Patient will  benefit from continued skilled physical therapy acutely and in recommended venue in order to address her deficits and improve function/QOL.        If plan is discharge home, recommend the following: A lot of help with walking and/or transfers;A lot of help with bathing/dressing/bathroom;Assist for transportation;Help with stairs or ramp for entrance   Can travel by private vehicle   No    Equipment Recommendations None recommended by PT  Recommendations for Other Services       Functional Status Assessment Patient has had a recent decline in their functional status and demonstrates the ability to make significant improvements in function in a reasonable and predictable amount of time.     Precautions / Restrictions Precautions Precautions: Fall Recall of Precautions/Restrictions: Intact Restrictions Weight Bearing Restrictions Per Provider Order: No      Mobility  Bed Mobility Overal bed mobility: Needs Assistance Bed Mobility: Supine to Sit, Sit to Supine     Supine to sit: Mod assist Sit to supine: Max assist   General bed mobility comments: HOB slightly elevated, req mod assist for trunk elevation supine>sit, assist with LE for sit to supine    Transfers Overall transfer level: Needs assistance Equipment used: Rolling walker (2 wheels) Transfers: Sit to/from Stand Sit to Stand: Max assist           General transfer comment: Attempts at STS limited due to pain on plantar surface of feet bilaterlly but L>R, pt brought to tears. Unable to achieve full stand    Ambulation/Gait  General Gait Details: Unable this date  Stairs        Wheelchair Mobility     Tilt Bed    Modified Rankin (Stroke Patients Only)       Balance Overall balance assessment: Needs assistance Sitting-balance support: Feet supported, Bilateral upper extremity supported, No upper extremity supported Sitting balance-Leahy Scale: Fair Sitting balance - Comments: Seated  EOB, posterior lean at times Postural control: Posterior lean     Standing balance comment: Unable to fully assess this date.             Pertinent Vitals/Pain Pain Assessment Pain Assessment: 0-10 Pain Score: 5  Pain Location: Feet Pain Descriptors / Indicators: Constant Pain Intervention(s): Limited activity within patient's tolerance, Monitored during session, Repositioned    Home Living Family/patient expects to be discharged to:: Private residence Living Arrangements: Alone Available Help at Discharge: Family;Available PRN/intermittently Type of Home: Apartment Home Access: Level entry       Home Layout: One level Home Equipment: Agricultural consultant (2 wheels);Cane - single point;Wheelchair - manual;Cane - quad;BSC/3in1 Additional Comments: Reports no change since last visit    Prior Function Prior Level of Function : Needs assist             Mobility Comments: Pt reports use of RW in household, limited to household per last note ADLs Comments: Per last note, prn assist from daughter for iADLs     Extremity/Trunk Assessment   Upper Extremity Assessment Upper Extremity Assessment: Generalized weakness    Lower Extremity Assessment Lower Extremity Assessment: Generalized weakness    Cervical / Trunk Assessment Cervical / Trunk Assessment: Kyphotic  Communication   Communication Communication: No apparent difficulties    Cognition Arousal: Alert Behavior During Therapy: WFL for tasks assessed/performed   PT - Cognitive impairments: No apparent impairments           Following commands: Intact       Cueing Cueing Techniques: Verbal cues     General Comments      Exercises     Assessment/Plan    PT Assessment Patient needs continued PT services;All further PT needs can be met in the next venue of care  PT Problem List Decreased range of motion;Decreased strength;Decreased activity tolerance;Decreased balance;Decreased mobility;Pain        PT Treatment Interventions DME instruction;Gait training;Functional mobility training;Therapeutic activities;Therapeutic exercise;Balance training;Patient/family education    PT Goals (Current goals can be found in the Care Plan section)  Acute Rehab PT Goals Patient Stated Goal: Return home after rehab stay PT Goal Formulation: With patient Time For Goal Achievement: 11/22/23 Potential to Achieve Goals: Good    Frequency Min 3X/week     Co-evaluation               AM-PAC PT 6 Clicks Mobility  Outcome Measure Help needed turning from your back to your side while in a flat bed without using bedrails?: A Lot Help needed moving from lying on your back to sitting on the side of a flat bed without using bedrails?: A Lot Help needed moving to and from a bed to a chair (including a wheelchair)?: Total Help needed standing up from a chair using your arms (e.g., wheelchair or bedside chair)?: A Lot Help needed to walk in hospital room?: A Lot Help needed climbing 3-5 steps with a railing? : Total 6 Click Score: 10    End of Session Equipment Utilized During Treatment: Gait belt Activity Tolerance: Patient limited by pain Patient left: in bed;with call  bell/phone within reach;with bed alarm set Nurse Communication: Mobility status PT Visit Diagnosis: Muscle weakness (generalized) (M62.81);Other abnormalities of gait and mobility (R26.89);Pain Pain - Right/Left: Left (R and L) Pain - part of body: Ankle and joints of foot    Time: 8660-8590 PT Time Calculation (min) (ACUTE ONLY): 30 min   Charges:   PT Evaluation $PT Eval Moderate Complexity: 1 Mod PT Treatments $Therapeutic Activity: 23-37 mins PT General Charges $$ ACUTE PT VISIT: 1 Visit         3:59 PM, 11/08/23 Siobahn Worsley Powell-Butler, PT, DPT [place] with [facility]

## 2023-11-08 NOTE — Progress Notes (Signed)
 Progress Note   Patient: Kristine Montgomery FMW:[id number] DOB: 1965/06/01 DOA: 11/05/2023     3 DOS: the patient was seen and examined on 11/08/2023   Brief hospital admission narrative: As per H&P written by Dr. Pearlean on 11/05/2023 Kristine Montgomery is a 58 y.o. female with medical history significant for congestive heart failure, coronary artery disease, COPD, rheumatoid arthritis.   Patient was in the ED 6/18 with complaints of bilateral lower extremity swelling, and low blood pressure.  Blood pressure cuff was adjusted, with improvement in blood pressure, she also received a fluid bolus.  Blood cultures were obtained, she was called to come to the ED today due for abnormal results.   Culture results today- 1 out of 2 bottles growing Staph epidermidis.  She reports ongoing bilateral lower extremity swelling that has persisted, but over the past week she has developed pain to bilateral lower extremities and pinkness to both legs.  Also reports intermittent pain with urination over the past 2 to 3 days.  No trauma, no wounds. No dizziness, no vomiting, no diarrhea, she has maintained good oral intake.  She has been compliant with her medications which include Eliquis , Lasix  20 mg daily.  No difficulty breathing, no cough, no fevers no chills, she is not on home O2.  She reports prior similar lower extremity symptoms a few months ago that was treated with ~5 days of antibiotics and resolved- ?? Name.    ED Course: Temperature 98.3.  Heart rate 60s to 81.  Respiratory 23.  Blood pressure systolic down to 26/46- ??  Accuracy as ED staff were having a problem with the blood pressure cuff in the ED. O2 sats down to 74% on room air, placed on 2 L. Lactic acid 1.2 >>1.1. WBC 8.8. Chest x-ray clear. BNP 91. 1 g IV ceftriaxone  given.  25 g albumin  solution given. Hospitalist to admit for hypotension and cellulitis.  Assessment and plan Chronic combined systolic and diastolic CHF (congestive heart  failure) (HCC) Stable and compensated.  Presenting with hypotension and cellulitis.  Last echo 2024 EF 55 to 60%, G1DD. - Resuming metoprolol  and Lasix ; neck step would be restarting Farxiga  and Entresto . - Low-dose midodrine 3 times a day has been started. - Continue to follow daily weights, strict I's and O's, low-sodium diet  Coronary artery disease involving native coronary artery of native heart with unstable angina pectoris (HCC) -Stable.   - Continue aspirin , statin and Eliquis  - Metoprolol  will be resumed - No chest pain.  Paroxysmal atrial fibrillation (HCC) Continue Eliquis  for secondary prevention - Continue telemetry monitoring - Follow electrolytes maintain potassium above 4 magnesium above 2 as much as possible. - Resume Toprol   Hypokalemia - Electrolytes repleted and currently stable - Continue to follow trend.  Cellulitis -Bilateral lower extremity edema, tenderness, erythema, differential warmth.  Rules out for sepsis.   - No fever or normal WBCs appreciated - Lower extremity Dopplers ruling out the presence of blood clots - Most likely component of venous insufficiency - Discussed with patient's the importance of low-sodium diet, leg elevation and the use of compression stockings.   - Continue the use of TED hoses  - Antibiotics will be transitioned to oral route using doxycycline  Hypotension Systolic down to 27/46 in the ED. Asymptomatic without dizziness.  Denies GI losses, and has maintained good oral intake.  She is on low dose- Lasix  20 mg daily.  Due to difficulty getting patient's blood pressure, a small adult blood pressure cuff was  used and was placed on patient's forearm. Hence ??accuracy.  25 g of 25% albumin  solution already given in ED with improvement in blood pressure. - Continue to hold fluid resuscitation - Overall vital signs stable - For the most part stable MAP and no complaints of lightheadedness or dizziness - After midodrine initiation  patient blood pressure has further stabilized - Continue to follow vital signs now that we are resuming Toprol  and Lasix   Morbid obesity -Body mass index is 54.07 kg/m. -Low-calorie diet and portion control discussed with patient.  Decreased hemoglobin - Appears to be hemodilution - No overt bleeding appreciated - Hemoglobin trending up - Continue to follow trend intermittently.   Subjective:  Afebrile, no chest pain, no nausea, no vomiting.  Complaining of pain in her legs bilaterally.  Physical Exam: Vitals:   11/07/23 1319 11/07/23 1923 11/08/23 0352 11/08/23 1247  BP: (!) 98/45 116/63 (!) 117/58 125/69  Pulse: 90 78 77 81  Resp:  17 20 18   Temp: 99 F (37.2 C) 98.6 F (37 C) 98.3 F (36.8 C) (!) 100.7 F (38.2 C)  TempSrc: Oral Oral Oral Oral  SpO2: 90% 95% 93% 91%  Weight:      Height:       General exam: Alert, awake, oriented x 3; denying shortness of breath and no chest pain.  Complaining of bilateral lower extremity pain. Respiratory system: No using accessory muscles; good saturation on room air. Cardiovascular system: Rate controlled, no rubs, no gallops, unable to assess JVD with body habitus. Gastrointestinal system: Abdomen is obese, nondistended, soft and nontender. No organomegaly or masses felt. Normal bowel sounds heard. Central nervous system: No focal neurological deficits. Extremities: No cyanosis or clubbing; 1+ edema appreciated bilaterally. Skin: No petechiae; erythematous changes from cellulitic process appears to be completely resolved on today's examination. Psychiatry: Judgement and insight appear normal. Mood & affect appropriate.   Latest data Reviewed: Uric acid 5.1 Magnesium: 2.4 CBC: WBC 6.7, hemoglobin 9.0 and platelet count 273K Basic metabolic panel: Sodium 140, potassium 3.9, chloride 107, bicarb 26, BUN 14, creatinine 0.70 and GFR >60   Family Communication: No family at bedside.  Disposition: Status is: Inpatient Remains  inpatient appropriate because: Continue IV antibiotics.  Anticipating discharge back home once medically stable.  Time spent: 50 minutes  Author: Eric Nunnery, MD 11/08/2023 4:24 PM  For on call review [email or web address].

## 2023-11-08 NOTE — Plan of Care (Signed)

## 2023-11-08 NOTE — Plan of Care (Signed)
  Problem: Acute Rehab PT Goals(only PT should resolve) Goal: Pt Will Go Supine/Side To Sit Outcome: Progressing Flowsheets (Taken 11/08/2023 1601) Pt will go Supine/Side to Sit: with minimal assist Goal: Patient Will Transfer Sit To/From Stand Outcome: Progressing Flowsheets (Taken 11/08/2023 1601) Patient will transfer sit to/from stand: with moderate assist Goal: Pt Will Transfer Bed To Chair/Chair To Bed Outcome: Progressing Flowsheets (Taken 11/08/2023 1601) Pt will Transfer Bed to Chair/Chair to Bed: with min assist Goal: Pt Will Ambulate Outcome: Progressing Flowsheets (Taken 11/08/2023 1601) Pt will Ambulate:  10 feet  with minimal assist  with rolling walker    4:02 PM, 11/08/23 Mylinh Cragg Powell-Butler, PT, DPT [place] with [facility]

## 2023-11-08 NOTE — Progress Notes (Signed)
 Mobility Specialist Progress Note:    11/08/23 1200  Mobility  Activity Dangled on edge of bed  Level of Assistance Maximum assist, patient does 25-49%  Assistive Device None  Range of Motion/Exercises Active;All extremities  Activity Response Tolerated well  Mobility visit 1 Mobility  Mobility Specialist Start Time (ACUTE ONLY) 1145  Mobility Specialist Stop Time (ACUTE ONLY) 1159  Mobility Specialist Time Calculation (min) (ACUTE ONLY) 14 min   Pt received requesting assistance to EOB. Required MaxA for bed mobility. Tolerated well, asx throughout. Left sitting EOB, alarm on. All needs met.   Sherrilee Ditty Mobility Specialist Please contact via Special educational needs teacher or  Rehab office at [phone]

## 2023-11-09 ENCOUNTER — Telehealth: Payer: Self-pay | Admitting: Internal Medicine

## 2023-11-09 DIAGNOSIS — I959 Hypotension, unspecified: Secondary | ICD-10-CM | POA: Diagnosis not present

## 2023-11-09 DIAGNOSIS — I25110 Atherosclerotic heart disease of native coronary artery with unstable angina pectoris: Secondary | ICD-10-CM | POA: Diagnosis not present

## 2023-11-09 DIAGNOSIS — I480 Paroxysmal atrial fibrillation: Secondary | ICD-10-CM | POA: Diagnosis not present

## 2023-11-09 DIAGNOSIS — I5042 Chronic combined systolic (congestive) and diastolic (congestive) heart failure: Secondary | ICD-10-CM | POA: Diagnosis not present

## 2023-11-09 LAB — BASIC METABOLIC PANEL WITH GFR
Anion gap: 6 (ref 5–15)
BUN: 16 mg/dL (ref 6–20)
CO2: 27 mmol/L (ref 22–32)
Calcium: 8.1 mg/dL — ABNORMAL LOW (ref 8.9–10.3)
Chloride: 104 mmol/L (ref 98–111)
Creatinine, Ser: 0.74 mg/dL (ref 0.44–1.00)
GFR, Estimated: >60 mL/min (ref >60)
Glucose, Bld: 91 mg/dL (ref 70–99)
Potassium: 3.4 mmol/L — ABNORMAL LOW (ref 3.5–5.1)
Sodium: 137 mmol/L (ref 135–145)

## 2023-11-09 MED ORDER — POTASSIUM CHLORIDE CRYS ER 20 MEQ PO TBCR
40.0000 meq | EXTENDED_RELEASE_TABLET | Freq: Once | ORAL | Status: AC
  Administered 2023-11-09: 40 meq via ORAL
  Filled 2023-11-09: qty 2

## 2023-11-09 MED ORDER — SACUBITRIL-VALSARTAN 24-26 MG PO TABS
1.0000 | ORAL_TABLET | Freq: Two times a day (BID) | ORAL | Status: DC
  Administered 2023-11-09 – 2023-11-10 (×3): 1 via ORAL
  Filled 2023-11-09 (×3): qty 1

## 2023-11-09 NOTE — Telephone Encounter (Signed)
 Pt has appt w/ Okey tomorrow 6/26 and is currently in [facility]. She wants to know if Dr Okey can see her there or if can make it a tele visit or if she just needs to reschedule. Requesting cb

## 2023-11-09 NOTE — Progress Notes (Signed)
 Mobility Specialist Progress Note:    11/09/23 1153  Mobility  Activity Transferred from bed to chair;Stood at bedside  Level of Assistance Moderate assist, patient does 50-74%  Assistive Device Front wheel walker  Distance Ambulated (ft) 2 ft  Range of Motion/Exercises Active;All extremities  Activity Response Tolerated well  Mobility Referral Yes  Mobility visit 1 Mobility  Mobility Specialist Start Time (ACUTE ONLY) 1142  Mobility Specialist Stop Time (ACUTE ONLY) 1155  Mobility Specialist Time Calculation (min) (ACUTE ONLY) 13 min   Pt received in bed, eager to get OOB. Required ModA to stand and transfer with RW. Tolerated well, asx throughout. Left with nursing staff, all needs met.   Sherrilee Ditty Mobility Specialist Please contact via Special educational needs teacher or  Rehab office at [phone]

## 2023-11-09 NOTE — Telephone Encounter (Signed)
 Patient will r/s apt

## 2023-11-09 NOTE — TOC Initial Note (Signed)
 Transition of Care [facility]) - Initial/Assessment Note    Patient Details  Name: Kristine Montgomery MRN: [id number] Date of Birth: 10-30-1965  Transition of Care [facility]) CM/SW Contact:    Lucie Lunger, LCSWA Phone Number: 11/09/2023, 11:51 AM  Clinical Narrative:                 CSW updated that PT is recommending SNF for pt. CSW spoke with pt at bedside about recommendation, pt states she is agreeable to SNF placement but wants to stay in [place]. Pts first choice is [facility] and second would be [facility]. CSW to complete SNF referral and send out for review. TOC to follow.   Expected Discharge Plan: Skilled Nursing Facility Barriers to Discharge: Continued Medical Work up   Patient Goals and CMS Choice Patient states their goals for this hospitalization and ongoing recovery are:: get stronger CMS Medicare.gov Compare Post Acute Care list provided to:: Patient Choice offered to / list presented to : Patient [place] ownership interest in [facility].provided to:: Patient    Expected Discharge Plan and Services In-house Referral: Clinical Social Work Discharge Planning Services: CM Consult Post Acute Care Choice: Skilled Nursing Facility Living arrangements for the past 2 months: Apartment                                      Prior Living Arrangements/Services Living arrangements for the past 2 months: Apartment Lives with:: Self Patient language and need for interpreter reviewed:: Yes Do you feel safe going back to the place where you live?: Yes      Need for Family Participation in Patient Care: Yes (Comment) Care giver support system in place?: Yes (comment) Current home services: DME Criminal Activity/Legal Involvement Pertinent to Current Situation/Hospitalization: No - Comment as needed  Activities of Daily Living   ADL Screening (condition at time of admission) Independently performs ADLs?: Yes (appropriate for developmental age) Is the patient deaf or have  difficulty hearing?: No Does the patient have difficulty seeing, even when wearing glasses/contacts?: No Does the patient have difficulty concentrating, remembering, or making decisions?: No  Permission Sought/Granted                  Emotional Assessment Appearance:: Appears stated age Attitude/Demeanor/Rapport: Engaged Affect (typically observed): Accepting Orientation: : Oriented to Self, Oriented to Place, Oriented to Situation, Oriented to  Time Alcohol / Substance Use: Not Applicable Psych Involvement: No (comment)  Admission diagnosis:  Hypotension [I95.9] Patient Active Problem List   Diagnosis Date Noted   Hypotension 11/05/2023   Chronic combined systolic and diastolic CHF (congestive heart failure) (HCC) 11/05/2023   Cellulitis 11/05/2023   Lower extremity edema 11/02/2023   Bilateral hearing loss 05/26/2023   Seropositive rheumatoid arthritis (HCC) 04/21/2022   High risk medication use 04/21/2022   Idiopathic gout of multiple sites 04/21/2022   Secondary hypercoagulable state (HCC) 10/14/2021   Paroxysmal atrial fibrillation (HCC) 03/23/2021   Hypokalemia 03/23/2021   Leukocytosis 03/23/2021   Thrombocytosis 03/23/2021   Elevated troponin 03/23/2021   Hyperglycemia 03/23/2021   Ischemic cardiomyopathy 07/31/2020   Coronary artery disease involving native coronary artery of native heart with unstable angina pectoris (HCC) 07/31/2020   Nocturnal hypoxemia due to obesity 07/31/2020   Morbid obesity (HCC) 07/31/2020   Tobacco abuse 07/31/2020   Acute combined systolic and diastolic CHF, NYHA class 4 (HCC) 07/29/2020   Mixed hyperlipidemia 07/29/2020  NSTEMI (non-ST elevated myocardial infarction) (HCC) 07/29/2020   Unstable angina (HCC) 07/28/2020   S/P right knee arthroscopy 05/23/20 05/28/2020   Tear of meniscus of knee joint    Synovitis of knee    Primary osteoarthritis of right knee    Encounter for screening fecal occult blood testing 04/09/2020    Encounter for gynecological examination with Papanicolaou smear of cervix 04/09/2020   Urinary tract infection without hematuria 02/27/2020   Dysuria 02/27/2020   Urge incontinence 04/10/2018   OAB (overactive bladder) 04/10/2018   Urinary frequency 04/10/2018   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 [place] [address] Phone: [phone] Fax: [phone]     Social Drivers of Health (SDOH) Social History: SDOH Screenings   Food Insecurity: No Food Insecurity (11/05/2023)  Housing: Low Risk  (11/05/2023)  Transportation Needs: No Transportation Needs (11/05/2023)  Utilities: Not At Risk (11/05/2023)  Alcohol Screen: Low Risk  (04/09/2020)  Depression (PHQ2-9): Low Risk  (04/09/2020)  Financial Resource Strain: Low Risk  (07/30/2020)  Physical Activity: Insufficiently Active (04/09/2020)  Social Connections: Socially Isolated (07/30/2020)  Stress: No Stress Concern Present (04/09/2020)  Tobacco Use: Medium Risk (11/02/2023)   SDOH Interventions:     Readmission Risk Interventions    11/09/2023   11:50 AM 11/06/2023    6:27 PM  Readmission Risk Prevention Plan  Transportation Screening Complete Complete  PCP or Specialist Appt within 5-7 Days  Complete  Home Care Screening Complete Complete  Medication Review (RN CM) Complete Complete

## 2023-11-09 NOTE — Progress Notes (Signed)
 Progress Note   Patient: Kristine Montgomery FMW:[id number] DOB: Mar 25, 1966 DOA: 11/05/2023     4 DOS: the patient was seen and examined on 11/09/2023   Brief hospital admission narrative: As per H&P written by Dr. Pearlean on 11/05/2023 Kristine Montgomery is a 58 y.o. female with medical history significant for congestive heart failure, coronary artery disease, COPD, rheumatoid arthritis.   Patient was in the ED 6/18 with complaints of bilateral lower extremity swelling, and low blood pressure.  Blood pressure cuff was adjusted, with improvement in blood pressure, she also received a fluid bolus.  Blood cultures were obtained, she was called to come to the ED today due for abnormal results.   Culture results today- 1 out of 2 bottles growing Staph epidermidis.  She reports ongoing bilateral lower extremity swelling that has persisted, but over the past week she has developed pain to bilateral lower extremities and pinkness to both legs.  Also reports intermittent pain with urination over the past 2 to 3 days.  No trauma, no wounds. No dizziness, no vomiting, no diarrhea, she has maintained good oral intake.  She has been compliant with her medications which include Eliquis , Lasix  20 mg daily.  No difficulty breathing, no cough, no fevers no chills, she is not on home O2.  She reports prior similar lower extremity symptoms a few months ago that was treated with ~5 days of antibiotics and resolved- ?? Name.    ED Course: Temperature 98.3.  Heart rate 60s to 81.  Respiratory 23.  Blood pressure systolic down to 26/46- ??  Accuracy as ED staff were having a problem with the blood pressure cuff in the ED. O2 sats down to 74% on room air, placed on 2 L. Lactic acid 1.2 >>1.1. WBC 8.8. Chest x-ray clear. BNP 91. 1 g IV ceftriaxone  given.  25 g albumin  solution given. Hospitalist to admit for hypotension and cellulitis.  Assessment and plan Chronic combined systolic and diastolic CHF (congestive heart  failure) (HCC) Stable and compensated.  Presenting with hypotension and cellulitis.  Last echo 2024 EF 55 to 60%, G1DD. - Resuming Entresto ; patient so far has tolerated Lasix  and Toprol . - Planning to resume Farxiga  at discharge. - Continue low-dose midodrine 3 times a day. - Continue to follow daily weights, strict I's and O's, low-sodium diet  Coronary artery disease involving native coronary artery of native heart with unstable angina pectoris (HCC) -Stable.   - Continue aspirin , statin and Eliquis  - Metoprolol  will be resumed - No chest pain.  Paroxysmal atrial fibrillation (HCC) Continue Eliquis  for secondary prevention - Continue telemetry monitoring - Follow electrolytes maintain potassium above 4 magnesium above 2 as much as possible. - Resume Toprol   Hypokalemia - Electrolytes repleted and currently stable - Continue to follow trend.  Cellulitis - At time of admission bilateral lower extremity edema, tenderness, erythema, differential warmth.  Rules out for sepsis.   - No fever or normal WBCs appreciated - Lower extremity Dopplers ruling out the presence of blood clots - Most likely component of venous insufficiency - Discussed with patient's the importance of low-sodium diet, leg elevation and the use of compression stockings.   - Antibiotics will be transitioned to oral route using doxycycline. - Continue keeping leg elevated is, continue as needed analgesics and the use of TED hoses.  Hypotension Systolic down to 27/46 in the ED. Asymptomatic without dizziness.  Denies GI losses, and has maintained good oral intake.  She is on low dose- Lasix  20 mg  daily.  Due to difficulty getting patient's blood pressure, a small adult blood pressure cuff was used and was placed on patient's forearm. Hence ??accuracy.  25 g of 25% albumin  solution already given in ED with improvement in blood pressure. - Continue to hold fluid resuscitation - Overall vital signs stable - For the  most part stable MAP and no complaints of lightheadedness or dizziness - After midodrine initiation patient blood pressure has further stabilized - Continue to follow vital signs now that we are resuming Entresto ; so far stable vital signs after resumption of Lasix  and Toprol .  Morbid obesity -Body mass index is 54.07 kg/m. -Low-calorie diet and portion control discussed with patient.  Decreased hemoglobin - Appears to be hemodilution - No overt bleeding appreciated - Hemoglobin trending up - Continue to follow trend intermittently.   Subjective:  No fever, no chest pain, no nausea, no vomiting.  Reporting pain in her legs bilaterally (slightly improved).  Physical Exam: Vitals:   11/08/23 1247 11/08/23 2035 11/09/23 0359 11/09/23 1351  BP: 125/69 (!) 106/59 117/71 112/73  Pulse: 81 69 70 65  Resp: 18 20 20 20   Temp: (!) 100.7 F (38.2 C) (!) 100.4 F (38 C) 97.7 F (36.5 C) 98.3 F (36.8 C)  TempSrc: Oral Oral Oral Oral  SpO2: 91% 95% 94% 92%  Weight:      Height:       General exam: Alert, awake, oriented x 3; no chest pain, no nausea, no vomiting, good saturation on room air.  Patient is afebrile.  Still reporting some pain in her lower extremity and feeling weak/deconditioned. Respiratory system: Good air movement bilaterally; no using accessory muscles. Cardiovascular system:RRR. No rubs or gallops; unable to assess JVD with body habitus. Gastrointestinal system: Abdomen is obese, nondistended, soft and nontender. No organomegaly or masses felt. Normal bowel sounds heard. Central nervous system: No focal neurological deficits. Extremities: No cyanosis or clubbing; 1+ edema appreciated bilaterally. Skin: No petechiae, no erythematous changes currently appreciated.  No open wounds. Psychiatry: Judgement and insight appear normal. Mood & affect appropriate.   Latest data Reviewed: Uric acid 5.1 Magnesium: 2.4 CBC: WBC 6.7, hemoglobin 9.0 and platelet count 273K Basic  metabolic panel: Sodium 137, potassium 3.4, chloride 104, bicarb 27, BUN 16, creatinine 0.74 and GFR >60   Family Communication: No family at bedside.  Disposition: Status is: Inpatient Remains inpatient appropriate because: Continue IV antibiotics.  Anticipating discharge back home once medically stable.  Time spent: 50 minutes  Author: Eric Nunnery, MD 11/09/2023 4:30 PM  For on call review [email or web address].

## 2023-11-09 NOTE — Progress Notes (Signed)
 Physical Therapy Treatment Patient Details Name: Kristine Montgomery MRN: [id number] DOB: 1965/08/28 Today's Date: 11/09/2023   History of Present Illness Kristine Montgomery is a 58 y.o. female with medical history significant for congestive heart failure, coronary artery disease, COPD, rheumatoid arthritis.     Patient was in the ED 6/18 with complaints of bilateral lower extremity swelling, and low blood pressure.  Blood pressure cuff was adjusted, with improvement in blood pressure, she also received a fluid bolus.  Blood cultures were obtained, she was called to come to the ED today due for abnormal results.     Culture results today- 1 out of 2 bottles growing Staph epidermidis.  She reports ongoing bilateral lower extremity swelling that has persisted, but over the past week she has developed pain to bilateral lower extremities and pinkness to both legs.  Also reports intermittent pain with urination over the past 2 to 3 days.  No trauma, no wounds.  No dizziness, no vomiting, no diarrhea, she has maintained good oral intake.  She has been compliant with her medications which include Eliquis , Lasix  20 mg daily.  No difficulty breathing, no cough, no fevers no chills, she is not on home O2.  She reports prior similar lower extremity symptoms a few months ago that was treated with ~5 days of antibiotics and resolved- ?? Name.    PT Comments  Pt tolerated today's treatment session, well, was in pain but able to progress through and improve activity tolerance. Today's session addressed improving activity tolerance with ambulation and sit/stand transfers. Pt noted with improvements in tolerance with ambulation 39ft x 2=42ft in total with RW at min assist. Pt able to complete sit/stands at min assist progressing to CGA when properly utilizing cues from therapist. Pt wanted to remain in recliner after therapy session, which is encouraging as pt wants to get better. Pt would continue to benefit from skilled acute  physical therapy services in order to progress toward POC goals, safety/independence with functional mobility and QOL.     If plan is discharge home, recommend the following: A lot of help with walking and/or transfers;A lot of help with bathing/dressing/bathroom;Assist for transportation;Help with stairs or ramp for entrance   Can travel by private vehicle     Yes  Equipment Recommendations  None recommended by PT    Recommendations for Other Services       Precautions / Restrictions Precautions Recall of Precautions/Restrictions: Intact Restrictions Weight Bearing Restrictions Per Provider Order: No     Mobility  Bed Mobility               General bed mobility comments: received in recliner. Patient Response: Cooperative  Transfers Overall transfer level: Needs assistance Equipment used: Rolling walker (2 wheels) Transfers: Sit to/from Stand Sit to Stand: Mod assist, Min assist, Contact guard assist           General transfer comment: 7x total sit/stands progressed from mod assist x 1 with verbal and tactile cues for proper UE management to min assist/CGA with proper cued form x5 at end of session.    Ambulation/Gait Ambulation/Gait assistance: Min assist Gait Distance (Feet): 16 Feet Assistive device: Rolling walker (2 wheels) Gait Pattern/deviations: Step-to pattern, Decreased step length - left, Decreased stance time - left, Trunk flexed, Wide base of support       General Gait Details: 27ft x 2 wtih seated rest break on EOB. Pt with excessive trunk flexion posture and gait abnormalities noted above. Maximum encouragement given throughout  gait trials. pt demonstrating better mood at EOS.   Stairs             Wheelchair Mobility     Tilt Bed Tilt Bed Patient Response: Cooperative  Modified [place] (Stroke Patients Only)       Balance Overall balance assessment: Needs assistance Sitting-balance support: Feet supported, Bilateral upper  extremity supported, No upper extremity supported Sitting balance-Leahy Scale: Good Sitting balance - Comments: Seated in recliner   Standing balance support: Bilateral upper extremity supported, During functional activity, Reliant on assistive device for balance Standing balance-Leahy Scale: Fair Standing balance comment: increased reliance on RW                            Communication Communication Communication: No apparent difficulties  Cognition Arousal: Alert Behavior During Therapy: WFL for tasks assessed/performed   PT - Cognitive impairments: No apparent impairments                         Following commands: Intact      Cueing Cueing Techniques: Verbal cues, Tactile cues  Exercises General Exercises - Lower Extremity Long Arc Quad: AROM, 15 reps, Seated, Right, Left, Both Heel Raises: 20 reps, AROM, Left, Right, Both, Seated    General Comments        Pertinent Vitals/Pain Pain Assessment Pain Assessment: Faces Faces Pain Scale: Hurts whole lot Pain Location: Feet Pain Descriptors / Indicators: Constant Pain Intervention(s): Monitored during session, Utilized relaxation techniques    Home Living                          Prior Function            PT Goals (current goals can now be found in the care plan section) Acute Rehab PT Goals Patient Stated Goal: Return home after rehab stay PT Goal Formulation: With patient Time For Goal Achievement: 11/22/23 Potential to Achieve Goals: Good    Frequency    Min 3X/week      PT Plan      Co-evaluation              AM-PAC PT 6 Clicks Mobility   Outcome Measure  Help needed turning from your back to your side while in a flat bed without using bedrails?: A Lot Help needed moving from lying on your back to sitting on the side of a flat bed without using bedrails?: A Lot Help needed moving to and from a bed to a chair (including a wheelchair)?: A Lot Help needed  standing up from a chair using your arms (e.g., wheelchair or bedside chair)?: A Lot Help needed to walk in hospital room?: A Lot Help needed climbing 3-5 steps with a railing? : Total 6 Click Score: 11    End of Session Equipment Utilized During Treatment: Gait belt Activity Tolerance: Patient tolerated treatment well;Patient limited by pain Patient left: with call bell/phone within reach;in chair Nurse Communication: Mobility status PT Visit Diagnosis: Muscle weakness (generalized) (M62.81);Other abnormalities of gait and mobility (R26.89);Pain Pain - Right/Left: Left Pain - part of body: Ankle and joints of foot     Time: 1320-1343 PT Time Calculation (min) (ACUTE ONLY): 23 min  Charges:    $Therapeutic Activity: 23-37 mins PT General Charges $$ ACUTE PT VISIT: 1 Visit  Kristine Montgomery PT, DPT [facility] [phone] office   Kristine Montgomery 11/09/2023, 2:04 PM

## 2023-11-09 NOTE — [place] FL2 (Signed)
 [place]  MEDICAID FL2 LEVEL OF CARE FORM     IDENTIFICATION  Patient Name: Kristine Montgomery Birthdate: December 18, 1965 Sex: female Admission Date (Current Location): 11/05/2023  [facility] and [place] Number:  [employer] and Address:  [facility],  [address] [place] [address]      Provider Number: [id number]  Attending Physician Name and Address:  Ricky Fines, MD  Relative Name and Phone Number:       Current Level of Care: Hospital Recommended Level of Care: Skilled Nursing Facility Prior Approval Number:    Date Approved/Denied:   PASRR Number:    Discharge Plan: SNF    Current Diagnoses: Patient Active Problem List   Diagnosis Date Noted   Hypotension 11/05/2023   Chronic combined systolic and diastolic CHF (congestive heart failure) (HCC) 11/05/2023   Cellulitis 11/05/2023   Lower extremity edema 11/02/2023   Bilateral hearing loss 05/26/2023   Seropositive rheumatoid arthritis (HCC) 04/21/2022   High risk medication use 04/21/2022   Idiopathic gout of multiple sites 04/21/2022   Secondary hypercoagulable state (HCC) 10/14/2021   Paroxysmal atrial fibrillation (HCC) 03/23/2021   Hypokalemia 03/23/2021   Leukocytosis 03/23/2021   Thrombocytosis 03/23/2021   Elevated troponin 03/23/2021   Hyperglycemia 03/23/2021   Ischemic cardiomyopathy 07/31/2020   Coronary artery disease involving native coronary artery of native heart with unstable angina pectoris (HCC) 07/31/2020   Nocturnal hypoxemia due to obesity 07/31/2020   Morbid obesity (HCC) 07/31/2020   Tobacco abuse 07/31/2020   Acute combined systolic and diastolic CHF, NYHA class 4 (HCC) 07/29/2020   Mixed hyperlipidemia 07/29/2020   NSTEMI (non-ST elevated myocardial infarction) (HCC) 07/29/2020   Unstable angina (HCC) 07/28/2020   S/P right knee arthroscopy 05/23/20 05/28/2020   Tear of meniscus of knee joint    Synovitis of knee    Primary osteoarthritis of right knee     Encounter for screening fecal occult blood testing 04/09/2020   Encounter for gynecological examination with Papanicolaou smear of cervix 04/09/2020   Urinary tract infection without hematuria 02/27/2020   Dysuria 02/27/2020   Urge incontinence 04/10/2018   OAB (overactive bladder) 04/10/2018   Urinary frequency 04/10/2018   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 07/15/2015   Left ear impacted cerumen 07/15/2015    Orientation RESPIRATION BLADDER Height & Weight     Self, Time, Situation, Place  O2 (2L) Continent Weight: 286 lb 2.5 oz (129.8 kg) Height:  5' 1 (154.9 cm)  BEHAVIORAL SYMPTOMS/MOOD NEUROLOGICAL BOWEL NUTRITION STATUS      Continent Diet (Heart healthy)  AMBULATORY STATUS COMMUNICATION OF NEEDS Skin   Extensive Assist Verbally Normal                       Personal Care Assistance Level of Assistance  Bathing, Feeding, Dressing Bathing Assistance: Limited assistance Feeding assistance: Independent Dressing Assistance: Limited assistance     Functional Limitations Info  Sight, Hearing, Speech Sight Info: Adequate Hearing Info: Adequate Speech Info: Adequate    SPECIAL CARE FACTORS FREQUENCY  PT (By licensed PT), OT (By licensed OT)     PT Frequency: 5 times weekly OT Frequency: 5 times weekly            Contractures Contractures Info: Not present    Additional Factors Info  Code Status, Allergies Code Status Info: FULL Allergies Info: NKA           Current Medications (11/09/2023):  This is the current hospital active medication list  Current Facility-Administered Medications  Medication Dose Route Frequency Provider Last Rate Last Admin   acetaminophen  (TYLENOL ) tablet 650 mg  650 mg Oral Q6H PRN Ricky Fines, MD   650 mg at 11/08/23 2133   Or   acetaminophen  (TYLENOL ) suppository 650 mg  650 mg Rectal Q6H PRN Ricky Fines, MD       apixaban  (ELIQUIS ) tablet 5 mg  5 mg Oral BID Ricky Fines, MD   5 mg at 11/09/23 0850    aspirin  EC tablet 81 mg  81 mg Oral Daily Ricky Fines, MD   81 mg at 11/09/23 0850   doxycycline (VIBRA-TABS) tablet 100 mg  100 mg Oral Q12H Ricky Fines, MD   100 mg at 11/09/23 9148   dronedarone  (MULTAQ ) tablet 400 mg  400 mg Oral BID WC Ricky Fines, MD   400 mg at 11/09/23 0850   furosemide  (LASIX ) tablet 20 mg  20 mg Oral Daily Ricky Fines, MD   20 mg at 11/09/23 9148   gabapentin  (NEURONTIN ) capsule 200 mg  200 mg Oral BID Ricky Fines, MD   200 mg at 11/09/23 0851   HYDROcodone -acetaminophen  (NORCO) 10-325 MG per tablet 1 tablet  1 tablet Oral Q6H PRN Ricky Fines, MD   1 tablet at 11/09/23 1154   hydrOXYzine (ATARAX) tablet 25 mg  25 mg Oral Q8H PRN Ricky Fines, MD   25 mg at 11/06/23 1756   metoprolol  succinate (TOPROL -XL) 24 hr tablet 12.5 mg  12.5 mg Oral Daily Ricky Fines, MD   12.5 mg at 11/09/23 0850   midodrine (PROAMATINE) tablet 2.5 mg  2.5 mg Oral TID WC Ricky Fines, MD   2.5 mg at 11/09/23 1154   ondansetron  (ZOFRAN ) tablet 4 mg  4 mg Oral Q6H PRN Ricky Fines, MD       Or   ondansetron  (ZOFRAN ) injection 4 mg  4 mg Intravenous Q6H PRN Ricky Fines, MD   4 mg at 11/06/23 1740   polyethylene glycol (MIRALAX / GLYCOLAX) packet 17 g  17 g Oral Daily PRN Ricky Fines, MD         Discharge Medications: Please see discharge summary for a list of discharge medications.  Relevant Imaging Results:  Relevant Lab Results:   Additional Information SSN: [id number], LCSWA

## 2023-11-10 ENCOUNTER — Ambulatory Visit: Payer: Medicare HMO | Admitting: Internal Medicine

## 2023-11-10 DIAGNOSIS — D649 Anemia, unspecified: Secondary | ICD-10-CM | POA: Diagnosis not present

## 2023-11-10 DIAGNOSIS — I110 Hypertensive heart disease with heart failure: Secondary | ICD-10-CM | POA: Diagnosis not present

## 2023-11-10 DIAGNOSIS — I480 Paroxysmal atrial fibrillation: Secondary | ICD-10-CM | POA: Diagnosis not present

## 2023-11-10 DIAGNOSIS — M47816 Spondylosis without myelopathy or radiculopathy, lumbar region: Secondary | ICD-10-CM | POA: Diagnosis not present

## 2023-11-10 DIAGNOSIS — H9193 Unspecified hearing loss, bilateral: Secondary | ICD-10-CM | POA: Diagnosis not present

## 2023-11-10 DIAGNOSIS — I2510 Atherosclerotic heart disease of native coronary artery without angina pectoris: Secondary | ICD-10-CM | POA: Diagnosis not present

## 2023-11-10 DIAGNOSIS — D509 Iron deficiency anemia, unspecified: Secondary | ICD-10-CM | POA: Diagnosis not present

## 2023-11-10 DIAGNOSIS — L0390 Cellulitis, unspecified: Secondary | ICD-10-CM | POA: Diagnosis not present

## 2023-11-10 DIAGNOSIS — D75838 Other thrombocytosis: Secondary | ICD-10-CM | POA: Diagnosis not present

## 2023-11-10 DIAGNOSIS — I25110 Atherosclerotic heart disease of native coronary artery with unstable angina pectoris: Secondary | ICD-10-CM | POA: Diagnosis not present

## 2023-11-10 DIAGNOSIS — I5032 Chronic diastolic (congestive) heart failure: Secondary | ICD-10-CM | POA: Diagnosis not present

## 2023-11-10 DIAGNOSIS — M5412 Radiculopathy, cervical region: Secondary | ICD-10-CM | POA: Diagnosis not present

## 2023-11-10 DIAGNOSIS — I255 Ischemic cardiomyopathy: Secondary | ICD-10-CM | POA: Diagnosis not present

## 2023-11-10 DIAGNOSIS — I959 Hypotension, unspecified: Secondary | ICD-10-CM | POA: Diagnosis not present

## 2023-11-10 DIAGNOSIS — R531 Weakness: Secondary | ICD-10-CM

## 2023-11-10 DIAGNOSIS — F4322 Adjustment disorder with anxiety: Secondary | ICD-10-CM | POA: Diagnosis not present

## 2023-11-10 DIAGNOSIS — Z6841 Body Mass Index (BMI) 40.0 and over, adult: Secondary | ICD-10-CM | POA: Diagnosis not present

## 2023-11-10 DIAGNOSIS — M059 Rheumatoid arthritis with rheumatoid factor, unspecified: Secondary | ICD-10-CM | POA: Diagnosis not present

## 2023-11-10 DIAGNOSIS — K5900 Constipation, unspecified: Secondary | ICD-10-CM | POA: Diagnosis not present

## 2023-11-10 DIAGNOSIS — I5042 Chronic combined systolic (congestive) and diastolic (congestive) heart failure: Secondary | ICD-10-CM | POA: Diagnosis not present

## 2023-11-10 DIAGNOSIS — M159 Polyosteoarthritis, unspecified: Secondary | ICD-10-CM | POA: Diagnosis not present

## 2023-11-10 DIAGNOSIS — L03119 Cellulitis of unspecified part of limb: Secondary | ICD-10-CM | POA: Diagnosis not present

## 2023-11-10 DIAGNOSIS — R600 Localized edema: Secondary | ICD-10-CM | POA: Diagnosis not present

## 2023-11-10 MED ORDER — GABAPENTIN 100 MG PO CAPS
200.0000 mg | ORAL_CAPSULE | Freq: Three times a day (TID) | ORAL | Status: AC
Start: 2023-11-10 — End: ?

## 2023-11-10 MED ORDER — DOXYCYCLINE HYCLATE 100 MG PO TABS: 100.0000 mg | ORAL_TABLET | Freq: Two times a day (BID) | ORAL | 0 refills | Status: AC

## 2023-11-10 MED ORDER — POLYETHYLENE GLYCOL 3350 17 G PO PACK: 17.0000 g | PACK | Freq: Every day | ORAL | Status: DC | PRN

## 2023-11-10 MED ORDER — ATORVASTATIN CALCIUM 80 MG PO TABS
40.0000 mg | ORAL_TABLET | Freq: Every day | ORAL | Status: AC
Start: 2023-11-10 — End: ?

## 2023-11-10 MED ORDER — HYDROCODONE-ACETAMINOPHEN 10-325 MG PO TABS: 1.0000 | ORAL_TABLET | Freq: Four times a day (QID) | ORAL | 0 refills | Status: DC | PRN

## 2023-11-10 MED ORDER — MIDODRINE HCL 2.5 MG PO TABS: 2.5000 mg | ORAL_TABLET | Freq: Three times a day (TID) | ORAL | Status: DC

## 2023-11-10 NOTE — Progress Notes (Signed)
 Mobility Specialist Progress Note:    11/10/23 1021  Mobility  Activity Ambulated with assistance in room;Transferred to/from BSC;Stood at bedside;Transferred from bed to chair  Level of Assistance Moderate assist, patient does 50-74%  Assistive Device None;BSC  Distance Ambulated (ft) 5 ft  Range of Motion/Exercises Active;All extremities  Activity Response Tolerated well  Mobility Referral Yes  Mobility visit 1 Mobility  Mobility Specialist Start Time (ACUTE ONLY) 1007  Mobility Specialist Stop Time (ACUTE ONLY) 1021  Mobility Specialist Time Calculation (min) (ACUTE ONLY) 14 min   Pt received sitting EOB with son and daughter requesting assistance to [facility]. Required ModA to stand and transfer using BSC. Tolerated well, pt agreeable to sitting in chair. Call bell in reach, RN in room. All needs met.   Sherrilee Ditty Mobility Specialist Please contact via Special educational needs teacher or  Rehab office at [phone]

## 2023-11-10 NOTE — Progress Notes (Signed)
 [facility] ambulance services here to transport patient. Patient belongings sent with. PIV and telemetry removed.

## 2023-11-10 NOTE — Plan of Care (Signed)
  Problem: Education: Goal: Knowledge of General Education information will improve Description: Including pain rating scale, medication(s)/side effects and non-pharmacologic comfort measures Outcome: Adequate for Discharge   Problem: Health Behavior/Discharge Planning: Goal: Ability to manage health-related needs will improve Outcome: Adequate for Discharge   Problem: Clinical Measurements: Goal: Ability to maintain clinical measurements within normal limits will improve Outcome: Adequate for Discharge Goal: Will remain free from infection Outcome: Adequate for Discharge Goal: Diagnostic test results will improve Outcome: Adequate for Discharge Goal: Respiratory complications will improve Outcome: Adequate for Discharge Goal: Cardiovascular complication will be avoided Outcome: Adequate for Discharge   Problem: Activity: Goal: Risk for activity intolerance will decrease Outcome: Adequate for Discharge   Problem: Nutrition: Goal: Adequate nutrition will be maintained Outcome: Adequate for Discharge   Problem: Coping: Goal: Level of anxiety will decrease Outcome: Adequate for Discharge   Problem: Elimination: Goal: Will not experience complications related to bowel motility Outcome: Adequate for Discharge Goal: Will not experience complications related to urinary retention Outcome: Adequate for Discharge   Problem: Pain Managment: Goal: General experience of comfort will improve and/or be controlled Outcome: Adequate for Discharge   Problem: Safety: Goal: Ability to remain free from injury will improve Outcome: Adequate for Discharge   Problem: Skin Integrity: Goal: Risk for impaired skin integrity will decrease Outcome: Adequate for Discharge   Problem: Acute Rehab PT Goals(only PT should resolve) Goal: Pt Will Go Supine/Side To Sit Outcome: Adequate for Discharge Goal: Patient Will Transfer Sit To/From Stand Outcome: Adequate for Discharge Goal: Pt Will  Transfer Bed To Chair/Chair To Bed Outcome: Adequate for Discharge Goal: Pt Will Ambulate Outcome: Adequate for Discharge

## 2023-11-10 NOTE — Progress Notes (Signed)
 Attempted to call report. No answer. [place] RN

## 2023-11-10 NOTE — Care Management Important Message (Signed)
 Important Message  Patient Details  Name: Kristine Montgomery MRN: [id number] Date of Birth: [birth year]   Important Message Given:  Yes - Medicare IM     Cyril Railey L Laterrian Hevener 11/10/2023, 10:08 AM

## 2023-11-10 NOTE — Plan of Care (Signed)
  Problem: Education: Goal: Knowledge of General Education information will improve Description: Including pain rating scale, medication(s)/side effects and non-pharmacologic comfort measures 11/10/2023 0610 by Ann Axel HERO, RN Outcome: Progressing 11/10/2023 0527 by Ann Axel HERO, RN Outcome: Progressing   Problem: Health Behavior/Discharge Planning: Goal: Ability to manage health-related needs will improve 11/10/2023 0610 by Ann Axel HERO, RN Outcome: Progressing 11/10/2023 0527 by Ann Axel HERO, RN Outcome: Progressing   Problem: Clinical Measurements: Goal: Ability to maintain clinical measurements within normal limits will improve 11/10/2023 0610 by Ann Axel HERO, RN Outcome: Progressing 11/10/2023 0527 by Ann Axel HERO, RN Outcome: Progressing

## 2023-11-10 NOTE — TOC Transition Note (Addendum)
 Transition of Care [facility]) - Discharge Note   Patient Details  Name: Kristine Montgomery MRN: [id number] Date of Birth: [birth year]  [facility] CM/SW Contact:  Lucie Lunger, LCSWA Phone Number: 11/10/2023, 1:14 PM   Clinical Narrative:    CSW spoke with pt to review bed offers. Pt state she is agreeable to [facility] as she prefers to stay in [place]. CSW updated that insurance shara has been approved for SN[facility]. CSW spoke to Cor[facility] at [facility] who states they can accept pt for SNF at this time. CSW updated MD and RN of this. CSW to provide room and report numbers to RN once MD has completed D/C. Transport to be arranged once pt is ready. TOC signing off.   Addendum 2pm: D/C clinicals sent to facility via HUB. RN provided with room and report numbers. Med necessity printed to floor for RN. EMS called for transport.   Final next level of care: Skilled Nursing Facility Barriers to Discharge: Barriers Resolved   Patient Goals and CMS Choice Patient states their goals for this hospitalization and ongoing recovery are:: go to SNF CMS Medicare.gov Compare Post Acute Care list provided to:: Patient Choice offered to / list presented to : Patient  ownership interest in [facility].provided to:: Patient    Discharge Placement                Patient to be transferred to facility by: EMS Name of family member notified: Family at bedside when talking with pt Patient and family notified of of transfer: 11/10/23  Discharge Plan and Services Additional resources added to the After Visit Summary for   In-house Referral: Clinical Social Work Discharge Planning Services: CM Consult Post Acute Care Choice: Skilled Nursing Facility                               Social Drivers of Health (SDOH) Interventions SDOH Screenings   Food Insecurity: No Food Insecurity (11/05/2023)  Housing: Low Risk  (11/05/2023)  Transportation Needs: No Transportation Needs  (11/05/2023)  Utilities: Not At Risk (11/05/2023)  Alcohol Screen: Low Risk  (04/09/2020)  Depression (PHQ2-9): Low Risk  (04/09/2020)  Financial Resource Strain: Low Risk  (07/30/2020)  Physical Activity: Insufficiently Active (04/09/2020)  Social Connections: Socially Isolated (07/30/2020)  Stress: No Stress Concern Present (04/09/2020)  Tobacco Use: Medium Risk (11/02/2023)     Readmission Risk Interventions    11/09/2023   11:50 AM 11/06/2023    6:27 PM  Readmission Risk Prevention Plan  Transportation Screening Complete Complete  PCP or Specialist Appt within 5-7 Days  Complete  Home Care Screening Complete Complete  Medication Review (RN CM) Complete Complete

## 2023-11-10 NOTE — Plan of Care (Signed)

## 2023-11-10 NOTE — Discharge Summary (Signed)
 Physician Discharge Summary   Patient: Kristine Montgomery MRN: [id number] DOB: 01/29/1966  Admit date:     11/05/2023  Discharge date: 11/10/23  Discharge Physician: Eric Nunnery   PCP: Bertell Satterfield, MD   Recommendations at discharge:  Repeat CBC to follow hemoglobin trend/stability Repeat basic metabolic panel to follow electrolytes and renal function Reassess blood pressure and adjust medication as needed Make sure patient follow-up with cardiology service as instructed. Continue assisting patient with weight loss management.. Repeat lipid panel and further adjust statin dosage as required (patient experiencing sickness associated myopathy while taking high-dose Lipitor ).  Discharge Diagnoses: Principal Problem:   Hypotension Active Problems:   Coronary artery disease involving native coronary artery of native heart with unstable angina pectoris (HCC)   Cellulitis   Ischemic cardiomyopathy   Paroxysmal atrial fibrillation (HCC)   Chronic combined systolic and diastolic CHF (congestive heart failure) (HCC)   Weakness  Brief hospital admission narrative: As per H&P written by Dr. Pearlean on 11/05/2023 Kristine Montgomery is a 58 y.o. female with medical history significant for congestive heart failure, coronary artery disease, COPD, rheumatoid arthritis.   Patient was in the ED 6/18 with complaints of bilateral lower extremity swelling, and low blood pressure.  Blood pressure cuff was adjusted, with improvement in blood pressure, she also received a fluid bolus.  Blood cultures were obtained, she was called to come to the ED today due for abnormal results.   Culture results today- 1 out of 2 bottles growing Staph epidermidis.  She reports ongoing bilateral lower extremity swelling that has persisted, but over the past week she has developed pain to bilateral lower extremities and pinkness to both legs.  Also reports intermittent pain with urination over the past 2 to 3 days.  No trauma,  no wounds. No dizziness, no vomiting, no diarrhea, she has maintained good oral intake.  She has been compliant with her medications which include Eliquis , Lasix  20 mg daily.  No difficulty breathing, no cough, no fevers no chills, she is not on home O2.  She reports prior similar lower extremity symptoms a few months ago that was treated with ~5 days of antibiotics and resolved- ?? Name.    ED Course: Temperature 98.3.  Heart rate 60s to 81.  Respiratory 23.  Blood pressure systolic down to 26/46- ??  Accuracy as ED staff were having a problem with the blood pressure cuff in the ED. O2 sats down to 74% on room air, placed on 2 L. Lactic acid 1.2 >>1.1. WBC 8.8. Chest x-ray clear. BNP 91. 1 g IV ceftriaxone  given.  25 g albumin  solution given. Hospitalist to admit for hypotension and cellulitis.  Assessment and Plan: Chronic combined systolic and diastolic CHF (congestive heart failure) (HCC) Stable and compensated.  Presenting with hypotension and cellulitis.  Last echo 2024 EF 55 to 60%, G1DD. - Resuming Entresto ; patient so far has tolerated Lasix  and Toprol . - Planning to resume Farxiga  at discharge. - Continue low-dose midodrine 3 times a day. - Continue to follow daily weights, strict I's and O's, low-sodium diet   Coronary artery disease involving native coronary artery of native heart with unstable angina pectoris (HCC) -Stable.   - Continue aspirin , statin (dose adjusted in the setting of myalgias) and Eliquis  - Metoprolol  will be resumed - No chest pain.   Paroxysmal atrial fibrillation (HCC) Continue Eliquis  for secondary prevention - Continue telemetry monitoring - Follow electrolytes maintain potassium above 4 magnesium above 2 as much as possible. - Resume  Toprol    Hypokalemia - Electrolytes repleted and currently stable - Continue to follow trend intermittently..   Cellulitis - At time of admission bilateral lower extremity edema, tenderness, erythema, differential  warmth.  Rules out for sepsis.   - No fever or normal WBCs appreciated - Lower extremity Dopplers ruling out the presence of blood clots - Most likely component of venous insufficiency - Discussed with patient's the importance of low-sodium diet, leg elevation and the use of compression stockings.   - Antibiotics will be transitioned to oral route using doxycycline. - Continue keeping leg elevated is, continue as needed analgesics and the use of TED hoses.   Hypotension Systolic down to 27/46 in the ED. Asymptomatic without dizziness.  Denies GI losses, and has maintained good oral intake.  She is on low dose- Lasix  20 mg daily.  Due to difficulty getting patient's blood pressure, a small adult blood pressure cuff was used and was placed on patient's forearm. Hence ??accuracy.  25 g of 25% albumin  solution already given in ED with improvement in blood pressure. - Continue to hold fluid resuscitation - Overall vital signs stable - For the most part stable MAP and no complaints of lightheadedness or dizziness - After midodrine initiation patient blood pressure has further stabilized - Continue to follow vital signs/BP now all GDMT meds has been resumed.   Morbid obesity -Body mass index is 54.07 kg/m. -Low-calorie diet and portion control discussed with patient.   Decreased hemoglobin - Appears to be hemodilutional - No overt bleeding appreciated - Hemoglobin trending up - Continue to follow trend intermittently. -outpatient follow up with GI service for anemia work up if needed.  Physical deconditioning - Patient discharged to skilled nursing facility for further rehabilitation and management  Neuropathy/myalgia - Statin dose has been discomfort - Patient's Neurontin  has been adjusted to 3 times a day - Continue as needed analgesics and follow clinical response.  Consultants: none  Procedures performed: See below for x-ray reports. Disposition: Skilled nursing facility Diet  recommendation: Heart healthy/low calorie diet.  DISCHARGE MEDICATION: Allergies as of 11/10/2023   No Known Allergies      Medication List     STOP taking these medications    Oxycodone  HCl 10 MG Tabs       TAKE these medications    aspirin  EC 81 MG tablet Take 81 mg by mouth daily. Swallow whole.   atorvastatin  80 MG tablet Commonly known as: LIPITOR  Take 0.5 tablets (40 mg total) by mouth daily. What changed: how much to take   dapagliflozin  propanediol 10 MG Tabs tablet Commonly known as: Farxiga  Take 1 tablet (10 mg total) by mouth daily.   doxycycline 100 MG tablet Commonly known as: VIBRA-TABS Take 1 tablet (100 mg total) by mouth every 12 (twelve) hours for 3 days.   dronedarone  400 MG tablet Commonly known as: MULTAQ  Take 1 tablet (400 mg total) by mouth 2 (two) times daily with a meal.   Eliquis  5 MG Tabs tablet Generic drug: apixaban  TAKE ONE TABLET BY MOUTH 2 TIMES A DAY   Entresto  24-26 MG Generic drug: sacubitril -valsartan  Take 1 tablet by mouth 2 (two) times daily.   famotidine  40 MG tablet Commonly known as: PEPCID  Take 1 tablet (40 mg total) by mouth every evening. Please keep scheduled appointment for future refills. Thank you. What changed: additional instructions   furosemide  20 MG tablet Commonly known as: LASIX  Take 20 mg by mouth daily.   gabapentin  100 MG capsule Commonly known as:  Neurontin  Take 2 capsules (200 mg total) by mouth 3 (three) times daily. What changed: when to take this   HYDROcodone -acetaminophen  10-325 MG tablet Commonly known as: NORCO Take 1 tablet by mouth every 6 (six) hours as needed for severe pain (pain score 7-10). What changed: reasons to take this   metoprolol  succinate 25 MG 24 hr tablet Commonly known as: TOPROL -XL Take 0.5 tablets (12.5 mg total) by mouth daily.   midodrine 2.5 MG tablet Commonly known as: PROAMATINE Take 1 tablet (2.5 mg total) by mouth 3 (three) times daily with meals.    nitroGLYCERIN  0.4 MG SL tablet Commonly known as: NITROSTAT  Place 0.4 mg under the tongue every 5 (five) minutes as needed for chest pain.   polyethylene glycol 17 g packet Commonly known as: MIRALAX / GLYCOLAX Take 17 g by mouth daily as needed for mild constipation.        Contact information for follow-up providers     Bertell Satterfield, MD. Schedule an appointment as soon as possible for a visit in 2 week(s).   Specialty: Internal Medicine Why: After discharge from the skilled nursing facility. Contact information: [address] [place] [address] [phone]              Contact information for after-discharge care     Destination     [facility] .   Service: Skilled Nursing Contact information: [address] [place]  [address] [phone]                    Discharge Exam: Filed Weights   11/05/23 1056 11/05/23 1241 11/05/23 1810  Weight: 124.7 kg 125.2 kg 129.8 kg    General exam: Alert, awake, oriented x 3; no chest pain, no nausea, no vomiting, good saturation on room air.  Patient is afebrile.  Still reporting some pain in her lower extremity and feeling weak/deconditioned. Respiratory system: Good air movement bilaterally; no using accessory muscles. Cardiovascular system:RRR. No rubs or gallops; unable to assess JVD with body habitus. Gastrointestinal system: Abdomen is obese, nondistended, soft and nontender. No organomegaly or masses felt. Normal bowel sounds heard. Central nervous system: No focal neurological deficits. Extremities: No cyanosis or clubbing; 1+ edema appreciated bilaterally. Skin: No petechiae, no erythematous changes currently appreciated.  No open wounds. Psychiatry: Judgement and insight appear normal. Mood & affect appropriate.   Condition at discharge: Stable and improved.  The results of significant diagnostics from this hospitalization (including  imaging, microbiology, ancillary and laboratory) are listed below for reference.   Imaging Studies: US  Venous Img Lower Bilateral (DVT) Result Date: 11/06/2023 CLINICAL DATA:  Bilateral lower extremity edema EXAM: BILATERAL LOWER EXTREMITY VENOUS DOPPLER ULTRASOUND TECHNIQUE: Gray-scale sonography with graded compression, as well as color Doppler and duplex ultrasound were performed to evaluate the lower extremity deep venous systems from the level of the common femoral vein and including the common femoral, femoral, profunda femoral, popliteal and calf veins including the posterior tibial, peroneal and gastrocnemius veins when visible. The superficial great saphenous vein was also interrogated. Spectral Doppler was utilized to evaluate flow at rest and with distal augmentation maneuvers in the common femoral, femoral and popliteal veins. COMPARISON:  None Available. FINDINGS: RIGHT LOWER EXTREMITY Common Femoral Vein: No evidence of thrombus. Normal compressibility, respiratory phasicity and response to augmentation. Saphenofemoral Junction: No evidence of thrombus. Normal compressibility and flow on color Doppler imaging. Profunda Femoral Vein: No evidence of thrombus. Normal compressibility and flow on color Doppler  imaging. Femoral Vein: No evidence of thrombus. Normal compressibility, respiratory phasicity and response to augmentation. Popliteal Vein: No evidence of thrombus. Normal compressibility, respiratory phasicity and response to augmentation. Calf Veins: No evidence of thrombus. Normal compressibility and flow on color Doppler imaging. Superficial Great Saphenous Vein: No evidence of thrombus. Normal compressibility. Venous Reflux:  None. Other Findings:  None. LEFT LOWER EXTREMITY Common Femoral Vein: No evidence of thrombus. Normal compressibility, respiratory phasicity and response to augmentation. Saphenofemoral Junction: No evidence of thrombus. Normal compressibility and flow on color Doppler  imaging. Profunda Femoral Vein: No evidence of thrombus. Normal compressibility and flow on color Doppler imaging. Femoral Vein: No evidence of thrombus. Normal compressibility, respiratory phasicity and response to augmentation. Popliteal Vein: No evidence of thrombus. Normal compressibility, respiratory phasicity and response to augmentation. Calf Veins: No evidence of thrombus. Normal compressibility and flow on color Doppler imaging. Superficial Great Saphenous Vein: No evidence of thrombus. Normal compressibility. Venous Reflux:  None. Other Findings:  None. IMPRESSION: No evidence of deep venous thrombosis in either lower extremity. Electronically Signed   By: Wilkie Lent M.D.   On: 11/06/2023 12:10   DG Chest 2 View Result Date: 11/05/2023 CLINICAL DATA:  Lower extremity edema EXAM: CHEST - 2 VIEW COMPARISON:  None Available. FINDINGS: Normal mediastinum and cardiac silhouette. Normal pulmonary vasculature. No evidence of effusion, infiltrate, or pneumothorax. No acute bony abnormality. IMPRESSION: No acute cardiopulmonary process. Electronically Signed   By: Jackquline Boxer M.D.   On: 11/05/2023 12:34   DG Chest Portable 1 View Result Date: 11/02/2023 CLINICAL DATA:  Peripheral edema, pain EXAM: PORTABLE CHEST 1 VIEW COMPARISON:  02/28/2023 FINDINGS: Single frontal view of the chest demonstrates a stable enlarged cardiac silhouette. No airspace disease, effusion, or pneumothorax. No acute bony abnormalities. IMPRESSION: 1. Stable enlarged cardiac silhouette. 2. No acute airspace disease. Electronically Signed   By: Ozell Daring M.D.   On: 11/02/2023 16:57   DG Tibia/Fibula Right Result Date: 11/02/2023 CLINICAL DATA:  Lower extremity edema, pain EXAM: RIGHT TIBIA AND FIBULA - 2 VIEW COMPARISON:  03/15/2023 FINDINGS: Frontal and lateral views of the right tibia and fibula are obtained. The dorsal soft tissues are excluded on the lateral views by collimation. There are no acute or destructive  bony abnormalities. Alignment of the knee and ankle is anatomic. Stable osteoarthritis. There is diffuse subcutaneous edema throughout the entirety of the right lower leg, greatest distally and at the ankle. No subcutaneous gas or radiopaque foreign body. IMPRESSION: 1. Diffuse soft tissue swelling. 2. No acute or destructive bony abnormality. Electronically Signed   By: Ozell Daring M.D.   On: 11/02/2023 16:56    Microbiology: Results for orders placed or performed during the hospital encounter of 11/05/23  Blood culture (routine x 2)     Status: None (Preliminary result)   Collection Time: 11/05/23  3:27 PM   Specimen: BLOOD LEFT ARM  Result Value Ref Range Status   Specimen Description   Final    BLOOD LEFT ARM BOTTLES DRAWN AEROBIC AND ANAEROBIC Performed at [facility], [address], [place], [place] [address]    Special Requests   Final    Blood Culture adequate volume Performed at [facility], [address] [place], [place] [address]    Culture   Final    NO GROWTH 4 DAYS Performed at [facility], [address], [place], [place] [address]    Report Status PENDING  Incomplete  Blood culture (routine x 2)  Status: None (Preliminary result)   Collection Time: 11/05/23  3:27 PM   Specimen: BLOOD RIGHT ARM  Result Value Ref Range Status   Specimen Description   Final    BLOOD RIGHT ARM BOTTLES DRAWN AEROBIC AND ANAEROBIC   Special Requests Blood Culture adequate volume  Final   Culture   Final    NO GROWTH 4 DAYS Performed at [facility], [address], [place], [place] [address]    Report Status PENDING  Incomplete  MRSA Next Gen by PCR, Nasal     Status: None   Collection Time: 11/05/23  6:11 PM   Specimen: Nasal Mucosa; Nasal Swab  Result Value Ref Range Status   MRSA by PCR Next Gen NOT DETECTED NOT DETECTED Final    Comment: (NOTE) The GeneXpert MRSA Assay (FDA approved for NASAL specimens only), is one component of a comprehensive MRSA colonization  surveillance program. It is not intended to diagnose MRSA infection nor to guide or monitor treatment for MRSA infections. Test performance is not FDA approved in patients less than 44 years old. Performed at [facility], [address] [place], [place] [address]     Labs: CBC: Recent Labs  Lab 11/05/23 1304 11/06/23 0242 11/07/23 0343  WBC 8.8 7.5 6.7  NEUTROABS 5.9  --   --   HGB 9.3* 7.4* 8.0*  HCT 32.3* 25.6* 27.6*  MCV 84.8 84.5 87.3  PLT 264 218 273   Basic Metabolic Panel: Recent Labs  Lab 11/05/23 1304 11/06/23 0242 11/07/23 0343 11/09/23 0422  NA 141 141 140 137  K 3.6 3.2* 3.9 3.4*  CL 107 108 107 104  CO2 24 26 26 27   GLUCOSE 118* 126* 102* 91  BUN 16 14 14 16   CREATININE 1.01* 0.80 0.70 0.74  CALCIUM  8.4* 8.0* 8.2* 8.1*  MG  --   --  2.4  --    Liver Function Tests: Recent Labs  Lab 11/05/23 1304  AST 17  ALT 11  ALKPHOS 71  BILITOT 1.0  PROT 7.2  ALBUMIN  2.6*   CBG: No results for input(s): GLUCAP in the last 168 hours.  Discharge time spent:  30 minutes.  Signed: Eric Nunnery, MD [facility] 11/10/2023

## 2023-11-11 DIAGNOSIS — D649 Anemia, unspecified: Secondary | ICD-10-CM | POA: Diagnosis not present

## 2023-11-11 DIAGNOSIS — I2510 Atherosclerotic heart disease of native coronary artery without angina pectoris: Secondary | ICD-10-CM | POA: Diagnosis not present

## 2023-11-11 DIAGNOSIS — I959 Hypotension, unspecified: Secondary | ICD-10-CM | POA: Diagnosis not present

## 2023-11-11 LAB — CULTURE, BLOOD (ROUTINE X 2)
Culture: NO GROWTH
Culture: NO GROWTH
Special Requests: ADEQUATE
Special Requests: ADEQUATE

## 2023-11-14 DIAGNOSIS — M47816 Spondylosis without myelopathy or radiculopathy, lumbar region: Secondary | ICD-10-CM | POA: Diagnosis not present

## 2023-11-14 DIAGNOSIS — I959 Hypotension, unspecified: Secondary | ICD-10-CM | POA: Diagnosis not present

## 2023-11-14 DIAGNOSIS — I25110 Atherosclerotic heart disease of native coronary artery with unstable angina pectoris: Secondary | ICD-10-CM | POA: Diagnosis not present

## 2023-11-14 DIAGNOSIS — F4322 Adjustment disorder with anxiety: Secondary | ICD-10-CM | POA: Diagnosis not present

## 2023-11-14 DIAGNOSIS — L0390 Cellulitis, unspecified: Secondary | ICD-10-CM | POA: Diagnosis not present

## 2023-11-14 DIAGNOSIS — M5412 Radiculopathy, cervical region: Secondary | ICD-10-CM | POA: Diagnosis not present

## 2023-11-14 DIAGNOSIS — R600 Localized edema: Secondary | ICD-10-CM | POA: Diagnosis not present

## 2023-11-14 DIAGNOSIS — I255 Ischemic cardiomyopathy: Secondary | ICD-10-CM | POA: Diagnosis not present

## 2023-11-15 DIAGNOSIS — D509 Iron deficiency anemia, unspecified: Secondary | ICD-10-CM | POA: Diagnosis not present

## 2023-11-15 DIAGNOSIS — M059 Rheumatoid arthritis with rheumatoid factor, unspecified: Secondary | ICD-10-CM | POA: Diagnosis not present

## 2023-11-15 DIAGNOSIS — D75838 Other thrombocytosis: Secondary | ICD-10-CM | POA: Diagnosis not present

## 2023-11-15 DIAGNOSIS — I959 Hypotension, unspecified: Secondary | ICD-10-CM | POA: Diagnosis not present

## 2023-11-17 DIAGNOSIS — M059 Rheumatoid arthritis with rheumatoid factor, unspecified: Secondary | ICD-10-CM | POA: Diagnosis not present

## 2023-11-17 DIAGNOSIS — I959 Hypotension, unspecified: Secondary | ICD-10-CM | POA: Diagnosis not present

## 2023-11-17 DIAGNOSIS — R600 Localized edema: Secondary | ICD-10-CM | POA: Diagnosis not present

## 2023-11-21 DIAGNOSIS — I959 Hypotension, unspecified: Secondary | ICD-10-CM | POA: Diagnosis not present

## 2023-11-21 DIAGNOSIS — D649 Anemia, unspecified: Secondary | ICD-10-CM | POA: Diagnosis not present

## 2023-11-21 DIAGNOSIS — I2510 Atherosclerotic heart disease of native coronary artery without angina pectoris: Secondary | ICD-10-CM | POA: Diagnosis not present

## 2023-11-21 DIAGNOSIS — K5900 Constipation, unspecified: Secondary | ICD-10-CM | POA: Diagnosis not present

## 2023-11-23 DIAGNOSIS — I959 Hypotension, unspecified: Secondary | ICD-10-CM | POA: Diagnosis not present

## 2023-11-23 DIAGNOSIS — D649 Anemia, unspecified: Secondary | ICD-10-CM | POA: Diagnosis not present

## 2023-11-23 DIAGNOSIS — I2510 Atherosclerotic heart disease of native coronary artery without angina pectoris: Secondary | ICD-10-CM | POA: Diagnosis not present

## 2023-11-24 DIAGNOSIS — I110 Hypertensive heart disease with heart failure: Secondary | ICD-10-CM | POA: Diagnosis not present

## 2023-11-24 DIAGNOSIS — I5032 Chronic diastolic (congestive) heart failure: Secondary | ICD-10-CM | POA: Diagnosis not present

## 2023-11-24 DIAGNOSIS — M159 Polyosteoarthritis, unspecified: Secondary | ICD-10-CM | POA: Diagnosis not present

## 2023-11-24 DIAGNOSIS — M5412 Radiculopathy, cervical region: Secondary | ICD-10-CM | POA: Diagnosis not present

## 2023-11-24 DIAGNOSIS — I480 Paroxysmal atrial fibrillation: Secondary | ICD-10-CM | POA: Diagnosis not present

## 2023-11-25 ENCOUNTER — Telehealth: Payer: Self-pay

## 2023-11-25 DIAGNOSIS — I959 Hypotension, unspecified: Secondary | ICD-10-CM | POA: Diagnosis not present

## 2023-11-25 DIAGNOSIS — D649 Anemia, unspecified: Secondary | ICD-10-CM | POA: Diagnosis not present

## 2023-11-25 DIAGNOSIS — I2510 Atherosclerotic heart disease of native coronary artery without angina pectoris: Secondary | ICD-10-CM | POA: Diagnosis not present

## 2023-11-25 NOTE — Telephone Encounter (Signed)
 Patient notified, will pick up Monday, 11/28/23

## 2023-11-28 DIAGNOSIS — I959 Hypotension, unspecified: Secondary | ICD-10-CM | POA: Diagnosis not present

## 2023-11-28 DIAGNOSIS — D649 Anemia, unspecified: Secondary | ICD-10-CM | POA: Diagnosis not present

## 2023-11-28 DIAGNOSIS — I2510 Atherosclerotic heart disease of native coronary artery without angina pectoris: Secondary | ICD-10-CM | POA: Diagnosis not present

## 2023-12-05 DIAGNOSIS — D649 Anemia, unspecified: Secondary | ICD-10-CM | POA: Diagnosis not present

## 2023-12-05 DIAGNOSIS — I959 Hypotension, unspecified: Secondary | ICD-10-CM | POA: Diagnosis not present

## 2023-12-05 DIAGNOSIS — I2510 Atherosclerotic heart disease of native coronary artery without angina pectoris: Secondary | ICD-10-CM | POA: Diagnosis not present

## 2023-12-07 DIAGNOSIS — I959 Hypotension, unspecified: Secondary | ICD-10-CM | POA: Diagnosis not present

## 2023-12-07 DIAGNOSIS — D649 Anemia, unspecified: Secondary | ICD-10-CM | POA: Diagnosis not present

## 2023-12-07 DIAGNOSIS — I2510 Atherosclerotic heart disease of native coronary artery without angina pectoris: Secondary | ICD-10-CM | POA: Diagnosis not present

## 2023-12-08 DIAGNOSIS — I2510 Atherosclerotic heart disease of native coronary artery without angina pectoris: Secondary | ICD-10-CM | POA: Diagnosis not present

## 2023-12-08 DIAGNOSIS — D649 Anemia, unspecified: Secondary | ICD-10-CM | POA: Diagnosis not present

## 2023-12-08 DIAGNOSIS — I959 Hypotension, unspecified: Secondary | ICD-10-CM | POA: Diagnosis not present

## 2023-12-09 DIAGNOSIS — Z743 Need for continuous supervision: Secondary | ICD-10-CM | POA: Diagnosis not present

## 2023-12-09 DIAGNOSIS — R5381 Other malaise: Secondary | ICD-10-CM | POA: Diagnosis not present

## 2023-12-09 DIAGNOSIS — R69 Illness, unspecified: Secondary | ICD-10-CM | POA: Diagnosis not present

## 2023-12-12 ENCOUNTER — Encounter: Admitting: Orthopedic Surgery

## 2023-12-12 DIAGNOSIS — M4802 Spinal stenosis, cervical region: Secondary | ICD-10-CM | POA: Insufficient documentation

## 2023-12-16 DIAGNOSIS — G894 Chronic pain syndrome: Secondary | ICD-10-CM | POA: Diagnosis not present

## 2023-12-16 DIAGNOSIS — I255 Ischemic cardiomyopathy: Secondary | ICD-10-CM | POA: Diagnosis not present

## 2023-12-16 DIAGNOSIS — L8990 Pressure ulcer of unspecified site, unspecified stage: Secondary | ICD-10-CM | POA: Diagnosis not present

## 2023-12-16 DIAGNOSIS — M5412 Radiculopathy, cervical region: Secondary | ICD-10-CM | POA: Diagnosis not present

## 2023-12-16 DIAGNOSIS — I110 Hypertensive heart disease with heart failure: Secondary | ICD-10-CM | POA: Diagnosis not present

## 2023-12-16 DIAGNOSIS — M47816 Spondylosis without myelopathy or radiculopathy, lumbar region: Secondary | ICD-10-CM | POA: Diagnosis not present

## 2023-12-20 ENCOUNTER — Telehealth: Payer: Self-pay | Admitting: Internal Medicine

## 2023-12-20 DIAGNOSIS — I110 Hypertensive heart disease with heart failure: Secondary | ICD-10-CM | POA: Diagnosis not present

## 2023-12-20 DIAGNOSIS — I25110 Atherosclerotic heart disease of native coronary artery with unstable angina pectoris: Secondary | ICD-10-CM | POA: Diagnosis not present

## 2023-12-20 DIAGNOSIS — J449 Chronic obstructive pulmonary disease, unspecified: Secondary | ICD-10-CM | POA: Diagnosis not present

## 2023-12-20 DIAGNOSIS — I255 Ischemic cardiomyopathy: Secondary | ICD-10-CM | POA: Diagnosis not present

## 2023-12-20 DIAGNOSIS — I959 Hypotension, unspecified: Secondary | ICD-10-CM | POA: Diagnosis not present

## 2023-12-20 DIAGNOSIS — I5042 Chronic combined systolic (congestive) and diastolic (congestive) heart failure: Secondary | ICD-10-CM | POA: Diagnosis not present

## 2023-12-20 DIAGNOSIS — I480 Paroxysmal atrial fibrillation: Secondary | ICD-10-CM | POA: Diagnosis not present

## 2023-12-20 DIAGNOSIS — I5020 Unspecified systolic (congestive) heart failure: Secondary | ICD-10-CM | POA: Diagnosis not present

## 2023-12-20 DIAGNOSIS — L03116 Cellulitis of left lower limb: Secondary | ICD-10-CM | POA: Diagnosis not present

## 2023-12-20 DIAGNOSIS — L03115 Cellulitis of right lower limb: Secondary | ICD-10-CM | POA: Diagnosis not present

## 2023-12-20 NOTE — Telephone Encounter (Signed)
 I spoke with Almarie.  She reports she is seeing patient for the first time today.  Patient was recently hospitalized and then went to skilled nursing facility.  She is now home.  Almarie reports patient reports feeling tired and weak.  Has had a large decline in recent months.  No dizziness and patient reports her BP runs low at times I told [facility] discharge instructions indicate patient should be taking lasix  20 mg daily.  Almarie reports patient states she is only taking lasix  but she had torsemide  in her bag also and reported she was taking everything in her bag.  I told Almarie patient should only take lasix  and I would let Dr Okey know. Midodrine  is on patient's medication list but Almarie states patient does not have this medication at home.  She does not know if patient was taking this at the skilled nursing facility or how long she has been off it.   Will send message to Dr Okey to see if patient should resume midodrine .  She will need new prescription sent to [facility] if she is to resume it.

## 2023-12-20 NOTE — Telephone Encounter (Signed)
 Pt c/o medication issue:  1. Name of Medication:  -Lasix  + Torsemide  -Midodrine   2. How are you currently taking this medication (dosage and times per day)?   3. Are you having a reaction (difficulty breathing--STAT)?   4. What is your medication issue?   Almarie would like to know if patient should be taking both Lasix  and Torsemide .  She also mentions today BP was 80/50. PCP advised that patient is supposed to be on Midodrine , but she/pt could not find a prescription at home or on medication list. Please advise.

## 2023-12-21 NOTE — Telephone Encounter (Signed)
 I saw the pt in clinic in July 2024   She was last seen in Nov 2024 by KANDICE Birmingham  Would need to see patient  in clinic to assess BP and medicine needs

## 2023-12-21 NOTE — Telephone Encounter (Signed)
Called patient no answer.Unable to leave a message no voice mail. ?

## 2023-12-22 DIAGNOSIS — L03116 Cellulitis of left lower limb: Secondary | ICD-10-CM | POA: Diagnosis not present

## 2023-12-22 DIAGNOSIS — I5042 Chronic combined systolic (congestive) and diastolic (congestive) heart failure: Secondary | ICD-10-CM | POA: Diagnosis not present

## 2023-12-22 DIAGNOSIS — L03115 Cellulitis of right lower limb: Secondary | ICD-10-CM | POA: Diagnosis not present

## 2023-12-22 DIAGNOSIS — I25110 Atherosclerotic heart disease of native coronary artery with unstable angina pectoris: Secondary | ICD-10-CM | POA: Diagnosis not present

## 2023-12-22 DIAGNOSIS — J449 Chronic obstructive pulmonary disease, unspecified: Secondary | ICD-10-CM | POA: Diagnosis not present

## 2023-12-22 DIAGNOSIS — I255 Ischemic cardiomyopathy: Secondary | ICD-10-CM | POA: Diagnosis not present

## 2023-12-22 DIAGNOSIS — I110 Hypertensive heart disease with heart failure: Secondary | ICD-10-CM | POA: Diagnosis not present

## 2023-12-22 DIAGNOSIS — I480 Paroxysmal atrial fibrillation: Secondary | ICD-10-CM | POA: Diagnosis not present

## 2023-12-22 DIAGNOSIS — I959 Hypotension, unspecified: Secondary | ICD-10-CM | POA: Diagnosis not present

## 2023-12-22 NOTE — Telephone Encounter (Signed)
Unable to reach pt or leave a message  

## 2023-12-27 DIAGNOSIS — I959 Hypotension, unspecified: Secondary | ICD-10-CM | POA: Diagnosis not present

## 2023-12-27 DIAGNOSIS — I110 Hypertensive heart disease with heart failure: Secondary | ICD-10-CM | POA: Diagnosis not present

## 2023-12-27 DIAGNOSIS — I5042 Chronic combined systolic (congestive) and diastolic (congestive) heart failure: Secondary | ICD-10-CM | POA: Diagnosis not present

## 2023-12-27 DIAGNOSIS — L03115 Cellulitis of right lower limb: Secondary | ICD-10-CM | POA: Diagnosis not present

## 2023-12-27 DIAGNOSIS — J449 Chronic obstructive pulmonary disease, unspecified: Secondary | ICD-10-CM | POA: Diagnosis not present

## 2023-12-27 DIAGNOSIS — I25110 Atherosclerotic heart disease of native coronary artery with unstable angina pectoris: Secondary | ICD-10-CM | POA: Diagnosis not present

## 2023-12-27 DIAGNOSIS — L03116 Cellulitis of left lower limb: Secondary | ICD-10-CM | POA: Diagnosis not present

## 2023-12-27 DIAGNOSIS — I255 Ischemic cardiomyopathy: Secondary | ICD-10-CM | POA: Diagnosis not present

## 2023-12-27 DIAGNOSIS — I480 Paroxysmal atrial fibrillation: Secondary | ICD-10-CM | POA: Diagnosis not present

## 2023-12-27 NOTE — Telephone Encounter (Signed)
 Patient is scheduled with Dr. Okey in Oct and is also on Wait List. Follows in [place].

## 2023-12-27 NOTE — Telephone Encounter (Signed)
 Kristine Montgomery with [facility] informed and verbalized understanding.

## 2023-12-28 DIAGNOSIS — I5042 Chronic combined systolic (congestive) and diastolic (congestive) heart failure: Secondary | ICD-10-CM | POA: Diagnosis not present

## 2023-12-28 DIAGNOSIS — I480 Paroxysmal atrial fibrillation: Secondary | ICD-10-CM | POA: Diagnosis not present

## 2023-12-28 DIAGNOSIS — I25110 Atherosclerotic heart disease of native coronary artery with unstable angina pectoris: Secondary | ICD-10-CM | POA: Diagnosis not present

## 2023-12-28 DIAGNOSIS — J449 Chronic obstructive pulmonary disease, unspecified: Secondary | ICD-10-CM | POA: Diagnosis not present

## 2023-12-28 DIAGNOSIS — L03116 Cellulitis of left lower limb: Secondary | ICD-10-CM | POA: Diagnosis not present

## 2023-12-28 DIAGNOSIS — I110 Hypertensive heart disease with heart failure: Secondary | ICD-10-CM | POA: Diagnosis not present

## 2023-12-28 DIAGNOSIS — I959 Hypotension, unspecified: Secondary | ICD-10-CM | POA: Diagnosis not present

## 2023-12-28 DIAGNOSIS — I255 Ischemic cardiomyopathy: Secondary | ICD-10-CM | POA: Diagnosis not present

## 2023-12-28 DIAGNOSIS — L03115 Cellulitis of right lower limb: Secondary | ICD-10-CM | POA: Diagnosis not present

## 2023-12-29 DIAGNOSIS — I110 Hypertensive heart disease with heart failure: Secondary | ICD-10-CM | POA: Diagnosis not present

## 2023-12-29 DIAGNOSIS — L03115 Cellulitis of right lower limb: Secondary | ICD-10-CM | POA: Diagnosis not present

## 2023-12-29 DIAGNOSIS — I255 Ischemic cardiomyopathy: Secondary | ICD-10-CM | POA: Diagnosis not present

## 2023-12-29 DIAGNOSIS — L03116 Cellulitis of left lower limb: Secondary | ICD-10-CM | POA: Diagnosis not present

## 2023-12-29 DIAGNOSIS — I5042 Chronic combined systolic (congestive) and diastolic (congestive) heart failure: Secondary | ICD-10-CM | POA: Diagnosis not present

## 2023-12-29 DIAGNOSIS — I480 Paroxysmal atrial fibrillation: Secondary | ICD-10-CM | POA: Diagnosis not present

## 2023-12-29 DIAGNOSIS — J449 Chronic obstructive pulmonary disease, unspecified: Secondary | ICD-10-CM | POA: Diagnosis not present

## 2023-12-29 DIAGNOSIS — I959 Hypotension, unspecified: Secondary | ICD-10-CM | POA: Diagnosis not present

## 2023-12-29 DIAGNOSIS — I25110 Atherosclerotic heart disease of native coronary artery with unstable angina pectoris: Secondary | ICD-10-CM | POA: Diagnosis not present

## 2024-01-02 DIAGNOSIS — I25110 Atherosclerotic heart disease of native coronary artery with unstable angina pectoris: Secondary | ICD-10-CM | POA: Diagnosis not present

## 2024-01-02 DIAGNOSIS — I480 Paroxysmal atrial fibrillation: Secondary | ICD-10-CM | POA: Diagnosis not present

## 2024-01-02 DIAGNOSIS — J449 Chronic obstructive pulmonary disease, unspecified: Secondary | ICD-10-CM | POA: Diagnosis not present

## 2024-01-02 DIAGNOSIS — I110 Hypertensive heart disease with heart failure: Secondary | ICD-10-CM | POA: Diagnosis not present

## 2024-01-02 DIAGNOSIS — I255 Ischemic cardiomyopathy: Secondary | ICD-10-CM | POA: Diagnosis not present

## 2024-01-02 DIAGNOSIS — L03115 Cellulitis of right lower limb: Secondary | ICD-10-CM | POA: Diagnosis not present

## 2024-01-02 DIAGNOSIS — I5042 Chronic combined systolic (congestive) and diastolic (congestive) heart failure: Secondary | ICD-10-CM | POA: Diagnosis not present

## 2024-01-02 DIAGNOSIS — L03116 Cellulitis of left lower limb: Secondary | ICD-10-CM | POA: Diagnosis not present

## 2024-01-02 DIAGNOSIS — I959 Hypotension, unspecified: Secondary | ICD-10-CM | POA: Diagnosis not present

## 2024-01-03 DIAGNOSIS — L03116 Cellulitis of left lower limb: Secondary | ICD-10-CM | POA: Diagnosis not present

## 2024-01-03 DIAGNOSIS — I25110 Atherosclerotic heart disease of native coronary artery with unstable angina pectoris: Secondary | ICD-10-CM | POA: Diagnosis not present

## 2024-01-03 DIAGNOSIS — I5042 Chronic combined systolic (congestive) and diastolic (congestive) heart failure: Secondary | ICD-10-CM | POA: Diagnosis not present

## 2024-01-03 DIAGNOSIS — I255 Ischemic cardiomyopathy: Secondary | ICD-10-CM | POA: Diagnosis not present

## 2024-01-03 DIAGNOSIS — I959 Hypotension, unspecified: Secondary | ICD-10-CM | POA: Diagnosis not present

## 2024-01-03 DIAGNOSIS — I480 Paroxysmal atrial fibrillation: Secondary | ICD-10-CM | POA: Diagnosis not present

## 2024-01-03 DIAGNOSIS — I110 Hypertensive heart disease with heart failure: Secondary | ICD-10-CM | POA: Diagnosis not present

## 2024-01-03 DIAGNOSIS — L03115 Cellulitis of right lower limb: Secondary | ICD-10-CM | POA: Diagnosis not present

## 2024-01-03 DIAGNOSIS — J449 Chronic obstructive pulmonary disease, unspecified: Secondary | ICD-10-CM | POA: Diagnosis not present

## 2024-01-09 DIAGNOSIS — L03115 Cellulitis of right lower limb: Secondary | ICD-10-CM | POA: Diagnosis not present

## 2024-01-09 DIAGNOSIS — L03116 Cellulitis of left lower limb: Secondary | ICD-10-CM | POA: Diagnosis not present

## 2024-01-09 DIAGNOSIS — I5042 Chronic combined systolic (congestive) and diastolic (congestive) heart failure: Secondary | ICD-10-CM | POA: Diagnosis not present

## 2024-01-09 DIAGNOSIS — I110 Hypertensive heart disease with heart failure: Secondary | ICD-10-CM | POA: Diagnosis not present

## 2024-01-10 DIAGNOSIS — M5412 Radiculopathy, cervical region: Secondary | ICD-10-CM | POA: Diagnosis not present

## 2024-01-10 DIAGNOSIS — I25110 Atherosclerotic heart disease of native coronary artery with unstable angina pectoris: Secondary | ICD-10-CM | POA: Diagnosis not present

## 2024-01-10 DIAGNOSIS — M159 Polyosteoarthritis, unspecified: Secondary | ICD-10-CM | POA: Diagnosis not present

## 2024-01-10 DIAGNOSIS — I959 Hypotension, unspecified: Secondary | ICD-10-CM | POA: Diagnosis not present

## 2024-01-10 DIAGNOSIS — L03115 Cellulitis of right lower limb: Secondary | ICD-10-CM | POA: Diagnosis not present

## 2024-01-10 DIAGNOSIS — J449 Chronic obstructive pulmonary disease, unspecified: Secondary | ICD-10-CM | POA: Diagnosis not present

## 2024-01-10 DIAGNOSIS — G894 Chronic pain syndrome: Secondary | ICD-10-CM | POA: Diagnosis not present

## 2024-01-10 DIAGNOSIS — I255 Ischemic cardiomyopathy: Secondary | ICD-10-CM | POA: Diagnosis not present

## 2024-01-10 DIAGNOSIS — L03116 Cellulitis of left lower limb: Secondary | ICD-10-CM | POA: Diagnosis not present

## 2024-01-10 DIAGNOSIS — I110 Hypertensive heart disease with heart failure: Secondary | ICD-10-CM | POA: Diagnosis not present

## 2024-01-10 DIAGNOSIS — R5381 Other malaise: Secondary | ICD-10-CM | POA: Diagnosis not present

## 2024-01-10 DIAGNOSIS — I5042 Chronic combined systolic (congestive) and diastolic (congestive) heart failure: Secondary | ICD-10-CM | POA: Diagnosis not present

## 2024-01-10 DIAGNOSIS — I480 Paroxysmal atrial fibrillation: Secondary | ICD-10-CM | POA: Diagnosis not present

## 2024-01-12 DIAGNOSIS — L03116 Cellulitis of left lower limb: Secondary | ICD-10-CM | POA: Diagnosis not present

## 2024-01-12 DIAGNOSIS — I110 Hypertensive heart disease with heart failure: Secondary | ICD-10-CM | POA: Diagnosis not present

## 2024-01-12 DIAGNOSIS — I25110 Atherosclerotic heart disease of native coronary artery with unstable angina pectoris: Secondary | ICD-10-CM | POA: Diagnosis not present

## 2024-01-12 DIAGNOSIS — I255 Ischemic cardiomyopathy: Secondary | ICD-10-CM | POA: Diagnosis not present

## 2024-01-12 DIAGNOSIS — L03115 Cellulitis of right lower limb: Secondary | ICD-10-CM | POA: Diagnosis not present

## 2024-01-12 DIAGNOSIS — I480 Paroxysmal atrial fibrillation: Secondary | ICD-10-CM | POA: Diagnosis not present

## 2024-01-12 DIAGNOSIS — I959 Hypotension, unspecified: Secondary | ICD-10-CM | POA: Diagnosis not present

## 2024-01-12 DIAGNOSIS — J449 Chronic obstructive pulmonary disease, unspecified: Secondary | ICD-10-CM | POA: Diagnosis not present

## 2024-01-12 DIAGNOSIS — I5042 Chronic combined systolic (congestive) and diastolic (congestive) heart failure: Secondary | ICD-10-CM | POA: Diagnosis not present

## 2024-01-15 DIAGNOSIS — L8990 Pressure ulcer of unspecified site, unspecified stage: Secondary | ICD-10-CM | POA: Diagnosis not present

## 2024-01-15 DIAGNOSIS — R5381 Other malaise: Secondary | ICD-10-CM | POA: Diagnosis not present

## 2024-01-15 DIAGNOSIS — M47816 Spondylosis without myelopathy or radiculopathy, lumbar region: Secondary | ICD-10-CM | POA: Diagnosis not present

## 2024-01-15 DIAGNOSIS — I959 Hypotension, unspecified: Secondary | ICD-10-CM | POA: Diagnosis not present

## 2024-01-18 ENCOUNTER — Telehealth: Payer: Self-pay | Admitting: Internal Medicine

## 2024-01-18 DIAGNOSIS — J449 Chronic obstructive pulmonary disease, unspecified: Secondary | ICD-10-CM | POA: Diagnosis not present

## 2024-01-18 DIAGNOSIS — I255 Ischemic cardiomyopathy: Secondary | ICD-10-CM | POA: Diagnosis not present

## 2024-01-18 DIAGNOSIS — L03115 Cellulitis of right lower limb: Secondary | ICD-10-CM | POA: Diagnosis not present

## 2024-01-18 DIAGNOSIS — I5042 Chronic combined systolic (congestive) and diastolic (congestive) heart failure: Secondary | ICD-10-CM | POA: Diagnosis not present

## 2024-01-18 DIAGNOSIS — I959 Hypotension, unspecified: Secondary | ICD-10-CM | POA: Diagnosis not present

## 2024-01-18 DIAGNOSIS — L03116 Cellulitis of left lower limb: Secondary | ICD-10-CM | POA: Diagnosis not present

## 2024-01-18 DIAGNOSIS — I110 Hypertensive heart disease with heart failure: Secondary | ICD-10-CM | POA: Diagnosis not present

## 2024-01-18 DIAGNOSIS — I25110 Atherosclerotic heart disease of native coronary artery with unstable angina pectoris: Secondary | ICD-10-CM | POA: Diagnosis not present

## 2024-01-18 DIAGNOSIS — I480 Paroxysmal atrial fibrillation: Secondary | ICD-10-CM | POA: Diagnosis not present

## 2024-01-18 NOTE — Telephone Encounter (Signed)
 Pt c/o BP issue: STAT if pt c/o blurred vision, one-sided weakness or slurred speech.   1. What is your BP concern?  BP is low. Has been in the 90's over 50's.  2. Have you taken any BP medication today? Manuelita with [facility] unsure. Mentions patient had appointment with PCP recently--discussed starting on Midodrine , but wanted to discuss with cardiology first. Please advise.  3. What are your last 5 BP readings? 98/60 today  4. Are you having any other symptoms (ex. Dizziness, headache, blurred vision, passed out)?  Swelling in feet and ankles

## 2024-01-18 NOTE — Telephone Encounter (Signed)
 Spoke with [facility]. Pt is unable to stand or walk at all. Pt uses wheelchair. Blood pressure's are as follows 98/60 80, 92/58 82, 98/58 70, 84/58 73, 80/50 84.

## 2024-01-18 NOTE — Telephone Encounter (Signed)
  Kristine Montgomery is returning your call

## 2024-01-18 NOTE — Telephone Encounter (Addendum)
 Pt c/o swelling to feet and ankles over the last 2 weeks. She states that the swelling goes down at night when they are elevated. Pt reports that she does elevate her legs during the day. Pt unable to report weights d/t not having a working scale. She states that her clothes and shoes fit normally. Pt said that she spoke with her PCP and was told that those numbers are normal. No other complaints. Pt as appt on 02/29/24 with Dr. Okey for follow-up. Please advise.

## 2024-01-19 ENCOUNTER — Encounter: Payer: Self-pay | Admitting: *Deleted

## 2024-01-19 DIAGNOSIS — L03115 Cellulitis of right lower limb: Secondary | ICD-10-CM | POA: Diagnosis not present

## 2024-01-19 DIAGNOSIS — I110 Hypertensive heart disease with heart failure: Secondary | ICD-10-CM | POA: Diagnosis not present

## 2024-01-19 DIAGNOSIS — I959 Hypotension, unspecified: Secondary | ICD-10-CM | POA: Diagnosis not present

## 2024-01-19 DIAGNOSIS — I255 Ischemic cardiomyopathy: Secondary | ICD-10-CM | POA: Diagnosis not present

## 2024-01-19 DIAGNOSIS — L03116 Cellulitis of left lower limb: Secondary | ICD-10-CM | POA: Diagnosis not present

## 2024-01-19 DIAGNOSIS — I25110 Atherosclerotic heart disease of native coronary artery with unstable angina pectoris: Secondary | ICD-10-CM | POA: Diagnosis not present

## 2024-01-19 DIAGNOSIS — I480 Paroxysmal atrial fibrillation: Secondary | ICD-10-CM | POA: Diagnosis not present

## 2024-01-19 DIAGNOSIS — I5042 Chronic combined systolic (congestive) and diastolic (congestive) heart failure: Secondary | ICD-10-CM | POA: Diagnosis not present

## 2024-01-19 DIAGNOSIS — J449 Chronic obstructive pulmonary disease, unspecified: Secondary | ICD-10-CM | POA: Diagnosis not present

## 2024-01-20 DIAGNOSIS — I480 Paroxysmal atrial fibrillation: Secondary | ICD-10-CM | POA: Diagnosis not present

## 2024-01-20 DIAGNOSIS — L03115 Cellulitis of right lower limb: Secondary | ICD-10-CM | POA: Diagnosis not present

## 2024-01-20 DIAGNOSIS — I5042 Chronic combined systolic (congestive) and diastolic (congestive) heart failure: Secondary | ICD-10-CM | POA: Diagnosis not present

## 2024-01-20 DIAGNOSIS — I959 Hypotension, unspecified: Secondary | ICD-10-CM | POA: Diagnosis not present

## 2024-01-20 DIAGNOSIS — J449 Chronic obstructive pulmonary disease, unspecified: Secondary | ICD-10-CM | POA: Diagnosis not present

## 2024-01-20 DIAGNOSIS — I25110 Atherosclerotic heart disease of native coronary artery with unstable angina pectoris: Secondary | ICD-10-CM | POA: Diagnosis not present

## 2024-01-20 DIAGNOSIS — I255 Ischemic cardiomyopathy: Secondary | ICD-10-CM | POA: Diagnosis not present

## 2024-01-20 DIAGNOSIS — I110 Hypertensive heart disease with heart failure: Secondary | ICD-10-CM | POA: Diagnosis not present

## 2024-01-20 DIAGNOSIS — L03116 Cellulitis of left lower limb: Secondary | ICD-10-CM | POA: Diagnosis not present

## 2024-01-23 NOTE — Telephone Encounter (Signed)
 BP readings are low  Stop Entresto   Follow BP at home   Bring log and cuff in to appt  Check CMET and BNP and TSH

## 2024-01-23 NOTE — Telephone Encounter (Signed)
 Pt notified to discontinue Entresto , follow BP at home and bring recordings and BP monitor to next appt. Pt informed of the need to have labs done and she states that she is unable to go out of the house to have them done. Florence Cella nurse at [phone], no answer, left msg to call office back.

## 2024-01-24 DIAGNOSIS — I959 Hypotension, unspecified: Secondary | ICD-10-CM | POA: Diagnosis not present

## 2024-01-24 DIAGNOSIS — I110 Hypertensive heart disease with heart failure: Secondary | ICD-10-CM | POA: Diagnosis not present

## 2024-01-24 DIAGNOSIS — I255 Ischemic cardiomyopathy: Secondary | ICD-10-CM | POA: Diagnosis not present

## 2024-01-24 DIAGNOSIS — I5042 Chronic combined systolic (congestive) and diastolic (congestive) heart failure: Secondary | ICD-10-CM | POA: Diagnosis not present

## 2024-01-24 DIAGNOSIS — L03116 Cellulitis of left lower limb: Secondary | ICD-10-CM | POA: Diagnosis not present

## 2024-01-24 DIAGNOSIS — J449 Chronic obstructive pulmonary disease, unspecified: Secondary | ICD-10-CM | POA: Diagnosis not present

## 2024-01-24 DIAGNOSIS — I480 Paroxysmal atrial fibrillation: Secondary | ICD-10-CM | POA: Diagnosis not present

## 2024-01-24 DIAGNOSIS — L03115 Cellulitis of right lower limb: Secondary | ICD-10-CM | POA: Diagnosis not present

## 2024-01-24 DIAGNOSIS — I25110 Atherosclerotic heart disease of native coronary artery with unstable angina pectoris: Secondary | ICD-10-CM | POA: Diagnosis not present

## 2024-01-24 NOTE — Telephone Encounter (Signed)
 Spoke with Kristine Montgomery - [facility] from [place]) and informed of the need for labs and that the pt is to stop taking Entresto .

## 2024-01-25 DIAGNOSIS — I959 Hypotension, unspecified: Secondary | ICD-10-CM | POA: Diagnosis not present

## 2024-01-25 DIAGNOSIS — L03115 Cellulitis of right lower limb: Secondary | ICD-10-CM | POA: Diagnosis not present

## 2024-01-25 DIAGNOSIS — I25110 Atherosclerotic heart disease of native coronary artery with unstable angina pectoris: Secondary | ICD-10-CM | POA: Diagnosis not present

## 2024-01-25 DIAGNOSIS — I480 Paroxysmal atrial fibrillation: Secondary | ICD-10-CM | POA: Diagnosis not present

## 2024-01-25 DIAGNOSIS — I110 Hypertensive heart disease with heart failure: Secondary | ICD-10-CM | POA: Diagnosis not present

## 2024-01-25 DIAGNOSIS — I255 Ischemic cardiomyopathy: Secondary | ICD-10-CM | POA: Diagnosis not present

## 2024-01-25 DIAGNOSIS — I5042 Chronic combined systolic (congestive) and diastolic (congestive) heart failure: Secondary | ICD-10-CM | POA: Diagnosis not present

## 2024-01-25 DIAGNOSIS — J449 Chronic obstructive pulmonary disease, unspecified: Secondary | ICD-10-CM | POA: Diagnosis not present

## 2024-01-25 DIAGNOSIS — L03116 Cellulitis of left lower limb: Secondary | ICD-10-CM | POA: Diagnosis not present

## 2024-01-26 ENCOUNTER — Telehealth: Payer: Self-pay | Admitting: Internal Medicine

## 2024-01-26 DIAGNOSIS — I959 Hypotension, unspecified: Secondary | ICD-10-CM | POA: Diagnosis not present

## 2024-01-26 DIAGNOSIS — I25110 Atherosclerotic heart disease of native coronary artery with unstable angina pectoris: Secondary | ICD-10-CM | POA: Diagnosis not present

## 2024-01-26 DIAGNOSIS — I480 Paroxysmal atrial fibrillation: Secondary | ICD-10-CM | POA: Diagnosis not present

## 2024-01-26 DIAGNOSIS — J449 Chronic obstructive pulmonary disease, unspecified: Secondary | ICD-10-CM | POA: Diagnosis not present

## 2024-01-26 DIAGNOSIS — L03116 Cellulitis of left lower limb: Secondary | ICD-10-CM | POA: Diagnosis not present

## 2024-01-26 DIAGNOSIS — I5042 Chronic combined systolic (congestive) and diastolic (congestive) heart failure: Secondary | ICD-10-CM | POA: Diagnosis not present

## 2024-01-26 DIAGNOSIS — I110 Hypertensive heart disease with heart failure: Secondary | ICD-10-CM | POA: Diagnosis not present

## 2024-01-26 DIAGNOSIS — I255 Ischemic cardiomyopathy: Secondary | ICD-10-CM | POA: Diagnosis not present

## 2024-01-26 DIAGNOSIS — L03115 Cellulitis of right lower limb: Secondary | ICD-10-CM | POA: Diagnosis not present

## 2024-01-26 NOTE — Telephone Encounter (Signed)
 Spoke with Kristine Montgomery who states that Pt's PCP Dr. Bertell is retiring on tomorrow. Pt will not have a PCP until the earliest 02/15/24 when [facility] takes over. Montgomery is asking that Dr. Okey signs the home health orders until new PCP is obtained. Please advise.

## 2024-01-26 NOTE — Telephone Encounter (Signed)
 I can fill Rx in the interval Pt needs follow up in clinic Seen by KANDICE Birmingham in Fall 2024  I saw her in summer 2024

## 2024-01-26 NOTE — Telephone Encounter (Signed)
 Caller Meg) is following up on home health orders for this patient.  Caller stated patient's primary doctor is retiring and patient will not have a PCP until 10/1.

## 2024-01-27 NOTE — Telephone Encounter (Signed)
 [facility] notified that Dr.Ross will sign the orders and that pt needs a follow up appt.

## 2024-01-31 DIAGNOSIS — L03116 Cellulitis of left lower limb: Secondary | ICD-10-CM | POA: Diagnosis not present

## 2024-01-31 DIAGNOSIS — L03115 Cellulitis of right lower limb: Secondary | ICD-10-CM | POA: Diagnosis not present

## 2024-01-31 DIAGNOSIS — I480 Paroxysmal atrial fibrillation: Secondary | ICD-10-CM | POA: Diagnosis not present

## 2024-01-31 DIAGNOSIS — I959 Hypotension, unspecified: Secondary | ICD-10-CM | POA: Diagnosis not present

## 2024-01-31 DIAGNOSIS — I5042 Chronic combined systolic (congestive) and diastolic (congestive) heart failure: Secondary | ICD-10-CM | POA: Diagnosis not present

## 2024-01-31 DIAGNOSIS — J449 Chronic obstructive pulmonary disease, unspecified: Secondary | ICD-10-CM | POA: Diagnosis not present

## 2024-01-31 DIAGNOSIS — I25110 Atherosclerotic heart disease of native coronary artery with unstable angina pectoris: Secondary | ICD-10-CM | POA: Diagnosis not present

## 2024-01-31 DIAGNOSIS — I110 Hypertensive heart disease with heart failure: Secondary | ICD-10-CM | POA: Diagnosis not present

## 2024-01-31 DIAGNOSIS — I255 Ischemic cardiomyopathy: Secondary | ICD-10-CM | POA: Diagnosis not present

## 2024-02-09 DIAGNOSIS — L03116 Cellulitis of left lower limb: Secondary | ICD-10-CM | POA: Diagnosis not present

## 2024-02-09 DIAGNOSIS — L03115 Cellulitis of right lower limb: Secondary | ICD-10-CM | POA: Diagnosis not present

## 2024-02-09 DIAGNOSIS — I110 Hypertensive heart disease with heart failure: Secondary | ICD-10-CM | POA: Diagnosis not present

## 2024-02-09 DIAGNOSIS — I25110 Atherosclerotic heart disease of native coronary artery with unstable angina pectoris: Secondary | ICD-10-CM | POA: Diagnosis not present

## 2024-02-09 DIAGNOSIS — J449 Chronic obstructive pulmonary disease, unspecified: Secondary | ICD-10-CM | POA: Diagnosis not present

## 2024-02-09 DIAGNOSIS — I480 Paroxysmal atrial fibrillation: Secondary | ICD-10-CM | POA: Diagnosis not present

## 2024-02-09 DIAGNOSIS — I255 Ischemic cardiomyopathy: Secondary | ICD-10-CM | POA: Diagnosis not present

## 2024-02-09 DIAGNOSIS — I959 Hypotension, unspecified: Secondary | ICD-10-CM | POA: Diagnosis not present

## 2024-02-09 DIAGNOSIS — I5042 Chronic combined systolic (congestive) and diastolic (congestive) heart failure: Secondary | ICD-10-CM | POA: Diagnosis not present

## 2024-02-10 ENCOUNTER — Other Ambulatory Visit: Payer: Self-pay | Admitting: Internal Medicine

## 2024-02-10 MED ORDER — DAPAGLIFLOZIN PROPANEDIOL 10 MG PO TABS: 10.0000 mg | ORAL_TABLET | Freq: Every day | ORAL | 0 refills | Status: DC

## 2024-02-13 ENCOUNTER — Other Ambulatory Visit: Payer: Self-pay | Admitting: Internal Medicine

## 2024-02-15 DIAGNOSIS — J449 Chronic obstructive pulmonary disease, unspecified: Secondary | ICD-10-CM | POA: Diagnosis not present

## 2024-02-15 DIAGNOSIS — L03116 Cellulitis of left lower limb: Secondary | ICD-10-CM | POA: Diagnosis not present

## 2024-02-15 DIAGNOSIS — I959 Hypotension, unspecified: Secondary | ICD-10-CM | POA: Diagnosis not present

## 2024-02-15 DIAGNOSIS — L03115 Cellulitis of right lower limb: Secondary | ICD-10-CM | POA: Diagnosis not present

## 2024-02-15 DIAGNOSIS — I480 Paroxysmal atrial fibrillation: Secondary | ICD-10-CM | POA: Diagnosis not present

## 2024-02-15 DIAGNOSIS — I255 Ischemic cardiomyopathy: Secondary | ICD-10-CM | POA: Diagnosis not present

## 2024-02-15 DIAGNOSIS — I110 Hypertensive heart disease with heart failure: Secondary | ICD-10-CM | POA: Diagnosis not present

## 2024-02-15 DIAGNOSIS — I25110 Atherosclerotic heart disease of native coronary artery with unstable angina pectoris: Secondary | ICD-10-CM | POA: Diagnosis not present

## 2024-02-15 DIAGNOSIS — I5042 Chronic combined systolic (congestive) and diastolic (congestive) heart failure: Secondary | ICD-10-CM | POA: Diagnosis not present

## 2024-02-18 ENCOUNTER — Other Ambulatory Visit: Payer: Self-pay | Admitting: Internal Medicine

## 2024-02-20 NOTE — Telephone Encounter (Signed)
 Prescription refill request for Eliquis  received. Indication:afib Last office visit:11/24 Scr:0.74  6/25 Age: 58 Weight:129.8  kg  Prescription refilled

## 2024-02-28 NOTE — Progress Notes (Unsigned)
 Cardiology Office Note   Date:  02/29/2024   ID:  Kristine Montgomery, DOB 1966-01-05, MRN [id number]  PCP:  Cathe Warren BRAVO, NP  Cardiologist:   Vina Gull, MD   Pateint presents for f/u of CAD     History of Present Illness: Kristine Montgomery is a 58 y.o. 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 07/2020, at 55-60% by echo in 2024  Hospitalized for afib with RVR  Converted with amiodarone    Switched to Multaq  in 2023  Feb 2024  LE edema   Rx increased lasix  for short term     March 2024  Echo  LVEF 55 to 60%     I saw the pt in July 2024  She has been seen by KANDICE Birmingham in the interval  [facility] denies CP   Breathing is OK  Denies palpitations  Does have LE edema     Doesn't move much   Says she tries to elevate her legs   Was hospitalized this summer for infection     Was on midodrine  transiently  Not taking now    Denies dizziness    Current Meds  Medication Sig   aspirin  EC 81 MG tablet Take 81 mg by mouth daily. Swallow whole.   atorvastatin  (LIPITOR ) 80 MG tablet Take 0.5 tablets (40 mg total) by mouth daily.   dapagliflozin  propanediol (FARXIGA ) 10 MG TABS tablet Take 1 tablet (10 mg total) by mouth daily.   dronedarone  (MULTAQ ) 400 MG tablet Take 1 tablet (400 mg total) by mouth 2 (two) times daily with a meal.   ELIQUIS  5 MG TABS tablet TAKE ONE TABLET BY MOUTH 2 TIMES A DAY   famotidine  (PEPCID ) 40 MG tablet Take 1 tablet (40 mg total) by mouth every evening. Please keep scheduled appointment for future refills. Thank you.   furosemide  (LASIX ) 20 MG tablet Take 20 mg by mouth daily.   gabapentin  (NEURONTIN ) 100 MG capsule Take 2 capsules (200 mg total) by mouth 3 (three) times daily.   HYDROcodone -acetaminophen   (NORCO) 10-325 MG tablet Take 1 tablet by mouth every 6 (six) hours as needed for severe pain (pain score 7-10).   metoprolol  succinate (TOPROL -XL) 25 MG 24 hr tablet Take 0.5 tablets (12.5 mg total) by mouth daily.   midodrine  (PROAMATINE ) 2.5 MG tablet Take 1 tablet (2.5 mg total) by mouth 3 (three) times daily with meals.   nitroGLYCERIN  (NITROSTAT ) 0.4 MG SL tablet Place 0.4 mg under the tongue every 5 (five) minutes as needed for chest pain.   polyethylene glycol (MIRALAX  / GLYCOLAX ) 17 g packet Take 17 g by mouth daily as needed for mild constipation.     Allergies:   Patient has no known allergies.   Past Medical History:  Diagnosis Date   Acute combined systolic and diastolic CHF, NYHA class 4 (HCC) 07/29/2020   a. EF 30% by echo in 07/2020 b. EF 55-60% by echo in 11/2020   Arthritis    Asthma    Atrial fibrillation (HCC)    CAD (coronary artery disease)    a. 07/2020: s/p NSTEMI with DES to proxLAD and DES to proxLCx with medical  management recommended of the occluded RCA.   Chronic bronchitis (HCC)    COPD (chronic obstructive pulmonary disease) (HCC)    Edema extremities    History of hiatal hernia    Hyperlipidemia LDL goal <70 07/29/2020   Ovarian cyst    Pneumonia    Rheumatoid arthritis (HCC)     Past Surgical History:  Procedure Laterality Date   CESAREAN SECTION     CORONARY STENT INTERVENTION N/A 07/29/2020   Procedure: CORONARY STENT INTERVENTION;  Surgeon: Burnard Debby LABOR, MD;  [facility];  Service: Cardiovascular;  Laterality: N/A;   KNEE ARTHROSCOPY WITH MEDIAL MENISECTOMY Right 05/23/2020   Procedure: KNEE ARTHROSCOPY WITH MEDIAL AND LATERAL MENISECTOMY; 3 COMPARTMENT SYNOVECTOMY;  Surgeon: Margrette Taft BRAVO, MD;  Location: [facility] ORS;  Service: Orthopedics;  Laterality: Right;   RIGHT/LEFT HEART CATH AND CORONARY ANGIOGRAPHY N/A 07/29/2020   Procedure: RIGHT/LEFT HEART CATH AND CORONARY ANGIOGRAPHY;  Surgeon: Burnard Debby LABOR, MD;  Location: [facility]  INVASIVE CV LAB;  Service: Cardiovascular;  Laterality: N/A;   TONSILLECTOMY     TUBAL LIGATION       Social History:  The patient  reports that she quit smoking about 3 years ago. Her smoking use included cigarettes. She started smoking about 33 years ago. She has a 15 pack-year smoking history. She has been exposed to tobacco smoke. She has never used smokeless tobacco. She reports that she does not currently use alcohol. She reports that she does not use drugs.   Family History:  The patient's family history includes COPD in her father; Cancer in her maternal grandmother, mother, and paternal grandfather; Congestive Heart Failure in her mother; Diabetes in her mother; Hypertension in her sister; Non-Hodgkin's lymphoma in her mother; Other in her son.    ROS:  Please see the history of present illness. All other systems are reviewed and  Negative to the above problem except as noted.    PHYSICAL EXAM: VS:  BP (!) 94/56   Pulse 64   Ht 5' 1 (1.549 m)   Wt 267 lb (121.1 kg)   LMP 06/27/2017 (Within Weeks)   SpO2 93%   BMI 50.45 kg/m   GEN: Morbidly obese in no acute distress Examined in chair Neck: no JVD, no carotid bruits Cardiac: RRR; no murmurs Respiratory:  clear to auscultation bilaterally  GI: soft, nontender, obese Ext 1+ LE edema into feet  EKG:  EKG is not ordered today.  Echo   March 2024 1. Left ventricular ejection fraction, by estimation, is 55 to 60%. The  left ventricle has normal function. The left ventricle demonstrates  regional wall motion abnormalities (see scoring diagram/findings for  description). There is mild left ventricular   hypertrophy. Left ventricular diastolic parameters are consistent with  Grade I diastolic dysfunction (impaired relaxation).   2. Right ventricular systolic function is normal. The right ventricular  size is normal. Tricuspid regurgitation signal is inadequate for assessing  PA pressure.   3. The mitral valve is grossly  normal. Trivial mitral valve  regurgitation.   4. The aortic valve is tricuspid. Aortic valve regurgitation is not  visualized.   5. The inferior vena cava is normal in size with greater than 50%  respiratory variability, suggesting right atrial pressure of 3 mmHg.   Comparison(s): Prior images reviewed side by side. LVEF remains normal  range at 55-60% and wall motion abnormalities are less apparent.    Lipid Panel    Component Value Date/Time   CHOL 112  06/02/2023 1154   TRIG 88 06/02/2023 1154   TRIG 126 08/21/2021 1402   HDL 41 06/02/2023 1154   HDL 49 08/21/2021 1402   CHOLHDL 2.7 06/02/2023 1154   VLDL 18 06/02/2023 1154   LDLCALC 53 06/02/2023 1154      Wt Readings from Last 3 Encounters:  02/29/24 267 lb (121.1 kg)  11/05/23 286 lb 2.5 oz (129.8 kg)  11/02/23 276 lb (125.2 kg)      ASSESSMENT AND PLAN:  1  CAD   PT with intervention to LAD and LCx in 2022  RCA occluded     She denies CP I would stop aspirin  since on Eliquis        2  HL  Keep on statin   LDL 53 in Jan 2025  3 Hx HFimpEF   Echo in 2024 LVEF 55 to 60%    Edema is probably due to venous insuff as pt's legs dependent Can take 40 mg lasix  once or twice per week     She is being fitted for a pneumatic compression device    Limit salt  4  PAF  Clinically in SR   Keep on Multaq  and Eliqus   Can stop low dose Toprol  XL   5  CV dz   Minimal plaquing    Keep on statin     Follow up tentatively in spring 2026 Current medicines are reviewed at length with the patient today.  The patient does not have concerns regarding medicines.  Signed, Vina Gull, MD  02/29/2024 9:47 AM    [facility] [address] [place], [place], [place]  [address] Phone: [phone]; Fax: [phone]

## 2024-02-29 ENCOUNTER — Encounter: Payer: Self-pay | Admitting: Internal Medicine

## 2024-02-29 ENCOUNTER — Ambulatory Visit: Attending: Internal Medicine | Admitting: Internal Medicine

## 2024-02-29 VITALS — BP 94/56 | HR 64 | Ht 61.0 in | Wt 267.0 lb

## 2024-02-29 DIAGNOSIS — I2510 Atherosclerotic heart disease of native coronary artery without angina pectoris: Secondary | ICD-10-CM

## 2024-02-29 NOTE — Patient Instructions (Signed)
 Medication Instructions:  Your physician has recommended you make the following change in your medication:   -Stop Aspirin   *If you need a refill on your cardiac medications before your next appointment, please call your pharmacy*  Lab Work: None If you have labs (blood work) drawn today and your tests are completely normal, you will receive your results only by: MyChart Message (if you have MyChart) OR A paper copy in the mail If you have any lab test that is abnormal or we need to change your treatment, we will call you to review the results.  Testing/Procedures: None  Follow-Up: At [facility], you and your health needs are our priority.  As part of our continuing mission to provide you with exceptional heart care, our providers are all part of one team.  This team includes your primary Cardiologist (physician) and Advanced Practice Providers or APPs (Physician Assistants and Nurse Practitioners) who all work together to provide you with the care you need, when you need it.  Your next appointment:   6-8 month(s)  Provider:   You may see Vina Gull, MD or one of the following Advanced Practice Providers on your designated Care Team:   Laymon Qua, PA-C  [place], [place] Olivia Pavy, [place]     We recommend signing up for the patient portal called MyChart.  Sign up information is provided on this After Visit Summary.  MyChart is used to connect with patients for Virtual Visits (Telemedicine).  Patients are able to view lab/test results, encounter notes, upcoming appointments, etc.  Non-urgent messages can be sent to your provider as well.   To learn more about what you can do with MyChart, go to [email or web address].   Other Instructions

## 2024-03-17 ENCOUNTER — Other Ambulatory Visit: Payer: Self-pay | Admitting: Internal Medicine

## 2024-03-29 ENCOUNTER — Telehealth: Payer: Self-pay

## 2024-03-29 NOTE — Telephone Encounter (Signed)
 Please help support patient with the MULTAQ  PAP process by filling out the provider portion on chart media. Please ensure that the provider portion has been signed and dated (no stamps). Please fax the completed provider page to [phone].

## 2024-04-02 NOTE — Telephone Encounter (Signed)
 Patient contacted and advised paperwork was ready to pick up. Pt verbalized understanding.

## 2024-04-04 ENCOUNTER — Encounter (HOSPITAL_COMMUNITY): Payer: Self-pay | Admitting: Emergency Medicine

## 2024-04-04 ENCOUNTER — Emergency Department (HOSPITAL_COMMUNITY)

## 2024-04-04 ENCOUNTER — Inpatient Hospital Stay (HOSPITAL_COMMUNITY)
Admission: EM | Admit: 2024-04-04 | Discharge: 2024-04-18 | DRG: 308 | Disposition: A | Discharge status: Skilled Nursing Facility | Attending: Internal Medicine | Admitting: Internal Medicine

## 2024-04-04 ENCOUNTER — Other Ambulatory Visit: Payer: Self-pay

## 2024-04-04 DIAGNOSIS — E8729 Other acidosis: Secondary | ICD-10-CM

## 2024-04-04 DIAGNOSIS — I25110 Atherosclerotic heart disease of native coronary artery with unstable angina pectoris: Secondary | ICD-10-CM | POA: Diagnosis present

## 2024-04-04 DIAGNOSIS — J9601 Acute respiratory failure with hypoxia: Secondary | ICD-10-CM | POA: Diagnosis present

## 2024-04-04 DIAGNOSIS — R7881 Bacteremia: Secondary | ICD-10-CM | POA: Insufficient documentation

## 2024-04-04 DIAGNOSIS — N390 Urinary tract infection, site not specified: Secondary | ICD-10-CM | POA: Diagnosis present

## 2024-04-04 DIAGNOSIS — E876 Hypokalemia: Secondary | ICD-10-CM | POA: Diagnosis present

## 2024-04-04 DIAGNOSIS — R112 Nausea with vomiting, unspecified: Secondary | ICD-10-CM

## 2024-04-04 DIAGNOSIS — J9691 Respiratory failure, unspecified with hypoxia: Secondary | ICD-10-CM | POA: Diagnosis not present

## 2024-04-04 DIAGNOSIS — R197 Diarrhea, unspecified: Secondary | ICD-10-CM

## 2024-04-04 DIAGNOSIS — K529 Noninfective gastroenteritis and colitis, unspecified: Secondary | ICD-10-CM | POA: Insufficient documentation

## 2024-04-04 DIAGNOSIS — I4901 Ventricular fibrillation: Principal | ICD-10-CM

## 2024-04-04 DIAGNOSIS — D649 Anemia, unspecified: Secondary | ICD-10-CM | POA: Insufficient documentation

## 2024-04-04 DIAGNOSIS — F4323 Adjustment disorder with mixed anxiety and depressed mood: Secondary | ICD-10-CM | POA: Insufficient documentation

## 2024-04-04 DIAGNOSIS — I5043 Acute on chronic combined systolic (congestive) and diastolic (congestive) heart failure: Secondary | ICD-10-CM | POA: Diagnosis not present

## 2024-04-04 DIAGNOSIS — I469 Cardiac arrest, cause unspecified: Secondary | ICD-10-CM | POA: Diagnosis present

## 2024-04-04 DIAGNOSIS — L8990 Pressure ulcer of unspecified site, unspecified stage: Secondary | ICD-10-CM | POA: Diagnosis present

## 2024-04-04 DIAGNOSIS — G894 Chronic pain syndrome: Secondary | ICD-10-CM | POA: Diagnosis present

## 2024-04-04 DIAGNOSIS — I480 Paroxysmal atrial fibrillation: Secondary | ICD-10-CM | POA: Diagnosis present

## 2024-04-04 DIAGNOSIS — Z720 Tobacco use: Secondary | ICD-10-CM | POA: Diagnosis present

## 2024-04-04 DIAGNOSIS — E782 Mixed hyperlipidemia: Secondary | ICD-10-CM | POA: Diagnosis not present

## 2024-04-04 DIAGNOSIS — S2249XA Multiple fractures of ribs, unspecified side, initial encounter for closed fracture: Secondary | ICD-10-CM | POA: Insufficient documentation

## 2024-04-04 LAB — COMPREHENSIVE METABOLIC PANEL WITH GFR
ALT: 10 U/L (ref 0–44)
AST: 37 U/L (ref 15–41)
Albumin: 2 g/dL — ABNORMAL LOW (ref 3.5–5.0)
Alkaline Phosphatase: 101 U/L (ref 38–126)
Anion gap: 6 (ref 5–15)
BUN: 7 mg/dL (ref 6–20)
CO2: 40 mmol/L — ABNORMAL HIGH (ref 22–32)
Calcium: 7.1 mg/dL — ABNORMAL LOW (ref 8.9–10.3)
Chloride: 98 mmol/L (ref 98–111)
Creatinine, Ser: 0.6 mg/dL (ref 0.44–1.00)
GFR, Estimated: >60 mL/min (ref >60)
Glucose, Bld: 96 mg/dL (ref 70–99)
Potassium: 2.1 mmol/L — CL (ref 3.5–5.1)
Sodium: 144 mmol/L (ref 135–145)
Total Bilirubin: 0.5 mg/dL (ref 0.0–1.2)
Total Protein: 6.5 g/dL (ref 6.5–8.1)

## 2024-04-04 LAB — URINALYSIS, ROUTINE W REFLEX MICROSCOPIC
Bilirubin Urine: NEGATIVE
Glucose, UA: >=500 mg/dL — AB
Hgb urine dipstick: NEGATIVE
Ketones, ur: NEGATIVE mg/dL
Nitrite: NEGATIVE
Protein, ur: NEGATIVE mg/dL
Specific Gravity, Urine: 1.012 (ref 1.005–1.030)
pH: 5 (ref 5.0–8.0)

## 2024-04-04 LAB — CBC
HCT: 32.5 % — ABNORMAL LOW (ref 36.0–46.0)
Hemoglobin: 9.9 g/dL — ABNORMAL LOW (ref 12.0–15.0)
MCH: 25.8 pg — ABNORMAL LOW (ref 26.0–34.0)
MCHC: 30.5 g/dL (ref 30.0–36.0)
MCV: 84.6 fL (ref 80.0–100.0)
Platelets: 434 K/uL — ABNORMAL HIGH (ref 150–400)
RBC: 3.84 MIL/uL — ABNORMAL LOW (ref 3.87–5.11)
RDW: 20.7 % — ABNORMAL HIGH (ref 11.5–15.5)
WBC: 10.4 K/uL (ref 4.0–10.5)
nRBC: 0 % (ref 0.0–0.2)

## 2024-04-04 LAB — LIPASE, BLOOD: Lipase: <10 U/L — ABNORMAL LOW (ref 11–51)

## 2024-04-04 LAB — TROPONIN T, HIGH SENSITIVITY
Troponin T High Sensitivity: 27 ng/L — ABNORMAL HIGH (ref 0–19)
Troponin T High Sensitivity: 22 ng/L — ABNORMAL HIGH (ref 0–19)

## 2024-04-04 LAB — MAGNESIUM: Magnesium: 2.1 mg/dL (ref 1.7–2.4)

## 2024-04-04 LAB — PRO BRAIN NATRIURETIC PEPTIDE: Pro Brain Natriuretic Peptide: 3741 pg/mL — ABNORMAL HIGH (ref <300.0)

## 2024-04-04 LAB — LACTIC ACID, PLASMA
Lactic Acid, Venous: 3.7 mmol/L (ref 0.5–1.9)
Lactic Acid, Venous: 3.1 mmol/L (ref 0.5–1.9)

## 2024-04-04 MED ORDER — POTASSIUM CHLORIDE CRYS ER 20 MEQ PO TBCR
40.0000 meq | EXTENDED_RELEASE_TABLET | Freq: Once | ORAL | Status: AC
  Administered 2024-04-04: 40 meq via ORAL
  Filled 2024-04-04: qty 2

## 2024-04-04 MED ORDER — POTASSIUM CHLORIDE 20 MEQ PO PACK
40.0000 meq | PACK | Freq: Once | ORAL | Status: AC
  Administered 2024-04-05: 40 meq via ORAL
  Filled 2024-04-04: qty 2

## 2024-04-04 MED ORDER — SODIUM CHLORIDE 0.9% FLUSH
3.0000 mL | Freq: Two times a day (BID) | INTRAVENOUS | Status: DC
  Administered 2024-04-04 – 2024-04-18 (×27): 3 mL via INTRAVENOUS

## 2024-04-04 MED ORDER — ENOXAPARIN SODIUM 40 MG/0.4ML IJ SOSY: 40.0000 mg | PREFILLED_SYRINGE | INTRAMUSCULAR | Status: DC

## 2024-04-04 MED ORDER — SODIUM CHLORIDE 0.9 % IV SOLN
250.0000 mL | INTRAVENOUS | Status: AC | PRN
Start: 2024-04-04 — End: 2024-04-07
  Administered 2024-04-05: 250 mL via INTRAVENOUS

## 2024-04-04 MED ORDER — ONDANSETRON HCL 4 MG/2ML IJ SOLN
4.0000 mg | Freq: Once | INTRAMUSCULAR | Status: AC
  Administered 2024-04-04: 4 mg via INTRAVENOUS
  Filled 2024-04-04: qty 2

## 2024-04-04 MED ORDER — POTASSIUM CHLORIDE 10 MEQ/100ML IV SOLN
10.0000 meq | INTRAVENOUS | Status: AC
  Administered 2024-04-04 (×4): 10 meq via INTRAVENOUS
  Filled 2024-04-04 (×3): qty 100

## 2024-04-04 MED ORDER — SODIUM CHLORIDE 0.9% FLUSH: 3.0000 mL | INTRAVENOUS | Status: DC | PRN

## 2024-04-04 MED ORDER — LACTATED RINGERS IV BOLUS
1000.0000 mL | Freq: Once | INTRAVENOUS | Status: AC
  Administered 2024-04-04: 1000 mL via INTRAVENOUS

## 2024-04-04 MED ORDER — POLYETHYLENE GLYCOL 3350 17 G PO PACK: 17.0000 g | PACK | Freq: Every day | ORAL | Status: DC | PRN

## 2024-04-04 MED ORDER — POTASSIUM CHLORIDE 20 MEQ PO PACK
40.0000 meq | PACK | Freq: Once | ORAL | Status: AC
  Administered 2024-04-04: 40 meq via ORAL
  Filled 2024-04-04: qty 2

## 2024-04-04 MED ORDER — CHLORHEXIDINE GLUCONATE CLOTH 2 % EX PADS
6.0000 | MEDICATED_PAD | Freq: Every day | CUTANEOUS | Status: DC
  Administered 2024-04-04 – 2024-04-15 (×7): 6 via TOPICAL

## 2024-04-04 MED ORDER — CIPROFLOXACIN IN D5W 400 MG/200ML IV SOLN
400.0000 mg | Freq: Once | INTRAVENOUS | Status: DC
  Administered 2024-04-04: 400 mg via INTRAVENOUS
  Filled 2024-04-04: qty 200

## 2024-04-04 MED ORDER — DOCUSATE SODIUM 100 MG PO CAPS
100.0000 mg | ORAL_CAPSULE | Freq: Two times a day (BID) | ORAL | Status: DC | PRN
Start: 2024-04-04 — End: 2024-04-09

## 2024-04-04 MED ORDER — MAGNESIUM SULFATE 2 GM/50ML IV SOLN
2.0000 g | Freq: Once | INTRAVENOUS | Status: AC
  Administered 2024-04-04: 2 g via INTRAVENOUS
  Filled 2024-04-04: qty 50

## 2024-04-04 MED ORDER — SODIUM CHLORIDE 0.9 % IV BOLUS
500.0000 mL | Freq: Once | INTRAVENOUS | Status: AC
  Administered 2024-04-04: 500 mL via INTRAVENOUS

## 2024-04-04 MED ORDER — SODIUM CHLORIDE 0.9 % IV SOLN
1.0000 g | Freq: Once | INTRAVENOUS | Status: DC
  Filled 2024-04-04: qty 10

## 2024-04-04 MED ORDER — ACETAMINOPHEN 325 MG PO TABS: 650.0000 mg | ORAL_TABLET | ORAL | Status: DC | PRN

## 2024-04-04 MED ORDER — POTASSIUM CHLORIDE 10 MEQ/100ML IV SOLN: 10.0000 meq | INTRAVENOUS | Status: AC

## 2024-04-04 MED ORDER — SODIUM CHLORIDE 0.9 % IV SOLN
3.0000 g | Freq: Four times a day (QID) | INTRAVENOUS | Status: DC
  Administered 2024-04-04 – 2024-04-07 (×10): 3 g via INTRAVENOUS
  Filled 2024-04-04 (×10): qty 8

## 2024-04-04 MED ORDER — CALCIUM GLUCONATE-NACL 1-0.675 GM/50ML-% IV SOLN
1.0000 g | Freq: Once | INTRAVENOUS | Status: AC
  Administered 2024-04-04: 1000 mg via INTRAVENOUS
  Filled 2024-04-04: qty 50

## 2024-04-04 NOTE — ED Notes (Signed)
 Just finished hooking pt to cardiac monitor and having a conversation with pt when she went unresponsive with eyes open,dilated pupils and agonal, snoring breathing noted. Respirations started with BVM, code bell called and felt no pulse. Cpr started at at that time. Pt placed on defib pads and showed vfib with no pulse at 1728. Pt immediately shocked at 200J, pt converted to a fib in 80s.  Responsiveness came back slowly. Zoll showed v fib with stable a fib after shock. Pt became fully a/o within 4 minutes. Pt was placed on HFNC 02 10L by RT due to sats high 80s at first responsiveness. Pt woke c/o chest sore. Event explained to pt. Pt comforted. Son and daughter updated and at bedside.

## 2024-04-04 NOTE — Progress Notes (Incomplete)
 NAME:  Kristine Montgomery, MRN:  [id number], DOB:  1965/08/13, LOS: 0 ADMISSION DATE:  04/04/2024, CONSULTATION DATE:  04/04/2024 REFERRING MD:  Dr. 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 She initially presented to the Ed with 4 days of nausea, vomiting and diarrhea. She denied fevers, sick contacts, or recent travel.   While awaiting labs in [facility] ED she had an episode of VT which decompensated to Vfib requiring CPR x 1 minute and defib x 1 with return to normal sinus rhythm. She was found to be hypokalemic with potassium 2.1 and hypocalcemic with calcium  of 7.1. She was given 70 mEQ potassium, 2 mg Mg and 1 gm calcium . She was transferred to [facility] ICU for further management.  Patient when reached [name] is in sinus rhythm.  She is alert and oriented.  She complains of some soreness in the chest.  Chest x-ray showed some congestion.  Repeat labs are pending right now.  EKG did not show anything acute.  Patient states that she has coronary artery disease and congestive heart failure.  She has chronic pedal edema.  She has been bedbound in the last 4 months because of obesity and cellulitis.  Bedside point-of-care ultrasound echo is done parasternal long axis did not show any significant pericardial effusion or right heart strain.  EF appears to be normal.  Pertinent  Medical History  Per above  Significant Hospital Events: Including procedures, antibiotic start and stop dates in addition to other pertinent events   11/19: Presented to [facility] ED for 2 days of n/v/d and had Vfib arrest in the setting of hypokalemia s/p defib x 1 with return to  NSR   Interim History / Subjective:  Vfib arrest at [facility] ED in the setting of hypokalemia requiring defib x 1 (200J) and 1 minute CPR. Post-arrest reportedly neuro intact.   Objective    Blood pressure (!) 91/48, pulse 66, temperature 97.9 F (36.6 C), temperature source Oral, resp. rate 13, last menstrual period 06/27/2017, SpO2 95%.        Intake/Output Summary (Last 24 hours) at 04/04/2024 1920 Last data filed at 04/04/2024 1846 Gross per 24 hour  Intake 178.97 ml  Output --  Net 178.97 ml   There were no vitals filed for this visit.  Examination: Physical exam: General morbidly obese patient.  No distress.  Complains of soreness in the chest but CPR is done.  Hemodynamically stable no respiratory distress. HEENT: Justin/AT, eyes anicteric.  moist mucus membranes Neuro: Alert, awake following commands Chest: Coarse breath sounds, no wheezes or rhonchi Heart: Regular rate and rhythm, no murmurs or gallops Abdomen: Soft, nontender, nondistended, bowel sounds present Extremities: Bilateral pedal edema.   Resolved problem list   Assessment and Plan   Cardiac arrest, VT->Vfib In the setting of hypokalemia, hypocalcemia and home Multaq  for Afib. Troponins in ED 27>22.  - Cardiology consulted, appreciate recommendations  - Holding home Multaq  - Aggressive electrolyte repletion, recheck BMP and Mg now  - Continue home Eliquis   Acute on chronic combined systolic and diastolic heart failure  History of CAD History of HLD Most  recent Echo 08/09/2022 EF 55-60%, grade I diastolic dysfunction, and normal RV. BNP in ED 3741. - Repeat Echo - Hold home dapa for now - Restart lasix  in the am as potassium normalizes. - Continue home statin  - Repeat chest x-ray showed pulmonary congestion no pneumothorax. - Repeat EKG did not show anything acute  Hypoxic Respiratory Failure  due to pulmonary congestion Continue oxygen  via high flow nasal cannula currently on 5 L/min flow. Use  Lasix  once potassium levels normalized.  Lactic acidosis  In the setting of above and likely hypovolemia due to GI losses.  - Continue to trend   Nausea/vomiting/diarrhea - Send GI pathogen panel  - Supportive care   Possible UTI UA in ED with large leukocytes, negative nitries, 21-50 WBC, and few bacteria. Denies dysuria or pelvic pain.  - Follow-up urine culture, low threshold to start antibiotics if clinically worsens  Full Code Heparin  for DVT prophylaxis  Son in the room. Spoke with him and updated.  Labs   CBC: Recent Labs  Lab 04/04/24 1600  WBC 10.4  HGB 9.9*  HCT 32.5*  MCV 84.6  PLT 434*    Basic Metabolic Panel: Recent Labs  Lab 04/04/24 1600 04/04/24 1744  NA 144  --   K 2.1*  --   CL 98  --   CO2 40*  --   GLUCOSE 96  --   BUN 7  --   CREATININE 0.60  --   CALCIUM  7.1*  --   MG  --  2.1   GFR: CrCl cannot be calculated (Unknown ideal weight.). Recent Labs  Lab 04/04/24 1600 04/04/24 1744  WBC 10.4  --   LATICACIDVEN  --  3.7*    Liver Function Tests: Recent Labs  Lab 04/04/24 1600  AST 37  ALT 10  ALKPHOS 101  BILITOT 0.5  PROT 6.5  ALBUMIN  2.0*   Recent Labs  Lab 04/04/24 1600  LIPASE <10*   No results for input(s): AMMONIA in the last 168 hours.  ABG    Component Value Date/Time   PHART 7.384 07/29/2020 1436   PCO2ART 50.3 (H) 07/29/2020 1436   PO2ART 83 07/29/2020 1436   HCO3 30.8 (H) 07/29/2020 1440   TCO2 32 07/29/2020 1440   O2SAT 71.0 07/29/2020 1440     Coagulation Profile: No results for input(s): INR, PROTIME in the last 168 hours.  Cardiac Enzymes: No results for input(s): CKTOTAL, CKMB, CKMBINDEX, TROPONINI in the last 168 hours.  HbA1C: Hgb A1c MFr Bld  Date/Time Value Ref Range Status  02/18/2022 03:17 PM 5.1 4.8 - 5.6 % Final    Comment:    (NOTE) Pre diabetes:          5.7%-6.4%  Diabetes:              >6.4%  Glycemic control for   <7.0% adults with diabetes   03/24/2021  01:36 PM 5.3 4.8 - 5.6 % Final    Comment:    (NOTE) Pre diabetes:          5.7%-6.4%  Diabetes:              >6.4%  Glycemic control for   <7.0% adults with diabetes     CBG: No results for input(s): GLUCAP in the last 168 hours.  Review of Systems:   12 point review of systems is done which is negative finding as mentioned in HPI.   Past Medical History:  She,  has a past medical history  of Acute combined systolic and diastolic CHF, NYHA class 4 (HCC) (07/29/2020), Arthritis, Asthma, Atrial fibrillation (HCC), CAD (coronary artery disease), Chronic bronchitis (HCC), COPD (chronic obstructive pulmonary disease) (HCC), Edema extremities, History of hiatal hernia, Hyperlipidemia LDL goal <70 (07/29/2020), Ovarian cyst, Pneumonia, and Rheumatoid arthritis (HCC).   Surgical History:   Past Surgical History:  Procedure Laterality Date   CESAREAN SECTION     CORONARY STENT INTERVENTION N/A 07/29/2020   Procedure: CORONARY STENT INTERVENTION;  Surgeon: Burnard Debby LABOR, MD;  [facility]: [facility];  Service: Cardiovascular;  Laterality: N/A;   KNEE ARTHROSCOPY WITH MEDIAL MENISECTOMY Right 05/23/2020   Procedure: KNEE ARTHROSCOPY WITH MEDIAL AND LATERAL MENISECTOMY; 3 COMPARTMENT SYNOVECTOMY;  Surgeon: Margrette Taft BRAVO, MD;  Location: [facility];  Service: Orthopedics;  Laterality: Right;   RIGHT/LEFT HEART CATH AND CORONARY ANGIOGRAPHY N/A 07/29/2020   Procedure: RIGHT/LEFT HEART CATH AND CORONARY ANGIOGRAPHY;  Surgeon: Burnard Debby LABOR, MD;  Location: [facility];  Service: Cardiovascular;  Laterality: N/A;   TONSILLECTOMY     TUBAL LIGATION       Social History:   reports that she quit smoking about 3 years ago. Her smoking use included cigarettes. She started smoking about 33 years ago. She has a 15 pack-year smoking history. She has been exposed to tobacco smoke. She has never used smokeless tobacco. She reports that she does not currently use alcohol. She reports that she  does not use drugs.   Family History:  Her family history includes COPD in her father; Cancer in her maternal grandmother, mother, and paternal grandfather; Congestive Heart Failure in her mother; Diabetes in her mother; Hypertension in her sister; Non-Hodgkin's lymphoma in her mother; Other in her son.   Allergies No Known Allergies   Home Medications  Prior to Admission medications   Medication Sig Start Date End Date Taking? Authorizing Provider  atorvastatin  (LIPITOR ) 80 MG tablet Take 0.5 tablets (40 mg total) by mouth daily. Patient taking differently: Take 80 mg by mouth daily. 11/10/23  Yes Ricky Fines, MD  dapagliflozin  propanediol (FARXIGA ) 10 MG TABS tablet Take 1 tablet (10 mg total) by mouth daily. 03/20/24  Yes Okey Vina GAILS, MD  dronedarone  (MULTAQ ) 400 MG tablet Take 1 tablet (400 mg total) by mouth 2 (two) times daily with a meal. 06/14/23  Yes Okey Vina GAILS, MD  ELIQUIS  5 MG TABS tablet TAKE ONE TABLET BY MOUTH 2 TIMES A DAY 02/20/24  Yes Okey Vina GAILS, MD  famotidine  (PEPCID ) 40 MG tablet Take 1 tablet (40 mg total) by mouth every evening. Please keep scheduled appointment for future refills. Thank you. 11/10/23  Yes Okey Vina GAILS, MD  furosemide  (LASIX ) 20 MG tablet Take 20 mg by mouth daily. 10/06/23  Yes [provider]  gabapentin  (NEURONTIN ) 100 MG capsule Take 2 capsules (200 mg total) by mouth 3 (three) times daily. 11/10/23  Yes Ricky Fines, MD  HYDROcodone -acetaminophen  [facility]) 10-325 MG tablet Take 1 tablet by mouth every 6 (six) hours as needed for severe pain (pain score 7-10). 11/10/23  Yes Ricky Fines, MD  potassium chloride  (KLOR-CON ) 10 MEQ tablet Take 10 mEq by mouth daily. 03/29/24  Yes [provider]  Vitamin D , Ergocalciferol , (DRISDOL) 1.25 MG (50000 UNIT) CAPS capsule Take 50,000 Units by mouth every Saturday. 03/29/24  Yes [provider]  nitroGLYCERIN  (NITROSTAT ) 0.4 MG SL tablet Place 0.4 mg under the tongue every 5  (five) minutes as needed for chest pain. Patient not taking: Reported on  04/04/2024    [provider]     Critical care time: 45 minutes     Tamela Stakes, MD  Attending Physician, Critical Care Medicine [facility] See Amion for pager If no response to pager, please call [phone] until 7pm After 7pm, Please call E-link [phone]

## 2024-04-04 NOTE — ED Notes (Signed)
 Date and time results received: 04/04/24 1704 (use smartphrase .now to insert current time)  Test: potassium  Critical Value: 2.1  Name of Provider Notified: Mliss Narrow PA  Orders Received? Or Actions Taken?: Orders Received - See Orders for details

## 2024-04-04 NOTE — ED Provider Notes (Signed)
 [place] EMERGENCY DEPARTMENT AT [facility] Provider Note   CSN: [id number] Arrival date & time: 04/04/24  1505     Patient presents with: Emesis   Kristine Montgomery is a 58 y.o. female with a history including A-fib, asthma, CAD with history of NSTEMI, CHF, RA, history of cellulitis, morbid obesity with resultant sedentary lifestyle and needing some assistance with ambulation presenting with a 2-day history of nausea vomiting and diarrhea, nonbloody along with abdominal cramping which improves after emesis or bowel movement.  She denies fevers, denies any exposures to others with similar symptoms.  She reports 2 episodes of vomiting and diarrhea both yesterday and today.  She has had no recent antibiotic use.  She has had no medications for symptoms prior to arrival.  She also has complaint of suspected worsening bedsores as she is describing pain at her perirectal area.   The history is provided by the patient.       Prior to Admission medications   Medication Sig Start Date End Date Taking? Authorizing Provider  atorvastatin  (LIPITOR ) 80 MG tablet Take 0.5 tablets (40 mg total) by mouth daily. Patient taking differently: Take 80 mg by mouth daily. 11/10/23  Yes Ricky Fines, MD  dapagliflozin  propanediol (FARXIGA ) 10 MG TABS tablet Take 1 tablet (10 mg total) by mouth daily. 03/20/24  Yes Okey Vina GAILS, MD  dronedarone  (MULTAQ ) 400 MG tablet Take 1 tablet (400 mg total) by mouth 2 (two) times daily with a meal. 06/14/23  Yes Okey Vina GAILS, MD  ELIQUIS  5 MG TABS tablet TAKE ONE TABLET BY MOUTH 2 TIMES A DAY 02/20/24  Yes Okey Vina GAILS, MD  famotidine  (PEPCID ) 40 MG tablet Take 1 tablet (40 mg total) by mouth every evening. Please keep scheduled appointment for future refills. Thank you. 11/10/23  Yes Okey Vina GAILS, MD  furosemide  (LASIX ) 20 MG tablet Take 20 mg by mouth daily. 10/06/23  Yes [provider]  gabapentin  (NEURONTIN ) 100 MG capsule Take 2 capsules (200 mg  total) by mouth 3 (three) times daily. 11/10/23  Yes Ricky Fines, MD  HYDROcodone -acetaminophen  [facility]) 10-325 MG tablet Take 1 tablet by mouth every 6 (six) hours as needed for severe pain (pain score 7-10). 11/10/23  Yes Ricky Fines, MD  potassium chloride  (KLOR-CON ) 10 MEQ tablet Take 10 mEq by mouth daily. 03/29/24  Yes [provider]  Vitamin D , Ergocalciferol , (DRISDOL) 1.25 MG (50000 UNIT) CAPS capsule Take 50,000 Units by mouth once a week. 03/29/24  Yes [provider]  metoprolol  succinate (TOPROL -XL) 25 MG 24 hr tablet Take 0.5 tablets (12.5 mg total) by mouth daily. Patient not taking: Reported on 04/04/2024 08/11/23   Okey Vina GAILS, MD  nitroGLYCERIN  (NITROSTAT ) 0.4 MG SL tablet Place 0.4 mg under the tongue every 5 (five) minutes as needed for chest pain. Patient not taking: Reported on 04/04/2024    [provider]  polyethylene glycol (MIRALAX  / GLYCOLAX ) 17 g packet Take 17 g by mouth daily as needed for mild constipation. Patient not taking: Reported on 04/04/2024 11/10/23   Ricky Fines, MD    Allergies: Patient has no known allergies.    Review of Systems  Constitutional:  Negative for chills and fever.  HENT:  Negative for congestion and sore throat.   Eyes: Negative.   Respiratory:  Negative for chest tightness and shortness of breath.   Cardiovascular:  Negative for chest pain.  Gastrointestinal:  Positive for abdominal pain, diarrhea, nausea and vomiting.  Genitourinary: Negative.  Negative for dysuria.  Musculoskeletal:  Negative for arthralgias, joint swelling and neck pain.  Skin:  Positive for wound. Negative for rash.  Neurological:  Negative for dizziness, weakness, light-headedness, numbness and headaches.  Psychiatric/Behavioral: Negative.      Updated Vital Signs BP (!) 86/55   Pulse 73   Temp 97.9 F (36.6 C) (Oral)   Resp 12   LMP 06/27/2017 (Within Weeks)   SpO2 95%   Physical Exam Vitals and nursing note  reviewed.  Constitutional:      Appearance: She is well-developed.  HENT:     Head: Normocephalic and atraumatic.  Eyes:     Conjunctiva/sclera: Conjunctivae normal.  Cardiovascular:     Rate and Rhythm: Normal rate and regular rhythm.     Heart sounds: Normal heart sounds.  Pulmonary:     Effort: Pulmonary effort is normal.     Breath sounds: Normal breath sounds. No wheezing.  Abdominal:     General: Bowel sounds are normal. There is distension.     Palpations: Abdomen is soft.     Tenderness: There is no abdominal tenderness. There is no guarding.     Comments: Normoactive bowel sounds.  Musculoskeletal:        General: Normal range of motion.     Cervical back: Normal range of motion.  Skin:    General: Skin is warm and dry.     Findings: Erythema present.     Comments: Mild erythema bilateral anterior tib's.  Pitting ankle edema.  Small stage II decubitus right buttock near midline.  Neurological:     General: No focal deficit present.     Mental Status: She is alert and oriented to person, place, and time.     (all labs ordered are listed, but only abnormal results are displayed) Labs Reviewed  LIPASE, BLOOD - Abnormal; Notable for the following components:      Result Value   Lipase <10 (*)    All other components within normal limits  COMPREHENSIVE METABOLIC PANEL WITH GFR - Abnormal; Notable for the following components:   Potassium 2.1 (*)    CO2 40 (*)    Calcium  7.1 (*)    Albumin  2.0 (*)    All other components within normal limits  CBC - Abnormal; Notable for the following components:   RBC 3.84 (*)    Hemoglobin 9.9 (*)    HCT 32.5 (*)    MCH 25.8 (*)    RDW 20.7 (*)    Platelets 434 (*)    All other components within normal limits  URINALYSIS, ROUTINE W REFLEX MICROSCOPIC - Abnormal; Notable for the following components:   Color, Urine AMBER (*)    APPearance HAZY (*)    Glucose, UA >=500 (*)    Leukocytes,Ua LARGE (*)    Bacteria, UA FEW (*)     All other components within normal limits  PRO BRAIN NATRIURETIC PEPTIDE - Abnormal; Notable for the following components:   Pro Brain Natriuretic Peptide 3,741.0 (*)    All other components within normal limits  TROPONIN T, HIGH SENSITIVITY - Abnormal; Notable for the following components:   Troponin T High Sensitivity 27 (*)    All other components within normal limits  TROPONIN T, HIGH SENSITIVITY - Abnormal; Notable for the following components:   Troponin T High Sensitivity 22 (*)    All other components within normal limits  URINE CULTURE  MAGNESIUM   LACTIC ACID, PLASMA  LACTIC ACID, PLASMA    EKG:  EKG Interpretation Date/Time:  Wednesday April 04 2024 17:31:04 EST Ventricular Rate:  80 PR Interval:    QRS Duration:  91 QT Interval:  287 QTC Calculation: 331 R Axis:   73  Text Interpretation: Atrial fibrillation Low voltage, precordial leads Nonspecific repol abnormality, diffuse leads Confirmed by Suzette Pac [phone]) on 04/04/2024 7:01:18 PM  Radiology: DG Chest Portable 1 View Result Date: 04/04/2024 CLINICAL DATA:  Shortness of breath EXAM: PORTABLE CHEST 1 VIEW COMPARISON:  Chest x-ray 11/05/2023 FINDINGS: The heart is enlarged. There is a band of atelectasis in the right upper lobe. The lungs are otherwise clear. There is no pleural effusion or pneumothorax. No acute fractures are seen. IMPRESSION: 1. Cardiomegaly. 2. Band of atelectasis in the right upper lobe. Electronically Signed   By: Greig Pique M.D.   On: 04/04/2024 17:23     Procedures   Medications Ordered in the ED  potassium chloride  10 mEq in 100 mL IVPB (10 mEq Intravenous New Bag/Given 04/04/24 1823)  sodium chloride  0.9 % bolus 500 mL (0 mLs Intravenous Stopped 04/04/24 1720)  ondansetron  (ZOFRAN ) injection 4 mg (4 mg Intravenous Given 04/04/24 1652)  potassium chloride  SA (KLOR-CON  M) CR tablet 40 mEq (40 mEq Oral Given 04/04/24 1757)  magnesium  sulfate IVPB 2 g 50 mL (0 g Intravenous  Stopped 04/04/24 1846)  lactated ringers  bolus 1,000 mL (0 mLs Intravenous Stopped 04/04/24 1846)  calcium  gluconate 1 g/ 50 mL sodium chloride  IVPB (0 mg Intravenous Stopped 04/04/24 1847)    Clinical Course as of 04/04/24 1900  Wed Apr 04, 2024  1735 Pt went into v fib,  unresponsive.  Shocked x 1 and return to NSR.  K low,  pending other labs.   [JI]    Clinical Course User Index [JI] Birdena Mliss RIGGERS                                 Medical Decision Making Patient presenting with nausea vomiting diarrhea, generalized weakness and acute on chronic lower extremity edema.  Patient at baseline has difficulty with ambulation secondary to body habitus and pain in her lower extremities secondary to swelling.  Nausea vomiting diarrhea for the past several days along with increased weakness.  Initial labs returned significant for a potassium level of 2.1.  During patient's ED stay while we are waiting additional lab results patient had an episode of V. tach to V-fib and became unresponsive.  She was shocked x 1 and she returned to normal sinus rhythm.  IV potassium is actively being given, she also has a low calcium  level, calcium  has been ordered, prophylactic mag has also been ordered as well.  Patient will require hospitalization, call placed to Dr. Neda with critical care who spoke with Dr Zammit and accepts patient for admission ICU at [facility].  Amount and/or Complexity of Data Reviewed Labs: ordered.    Details: Labs significant for potassium of 2.1, she also has a calcium  of 7.1, her albumin  is 2.0, WBC count is normal at 10.4 [place] she has a hemoglobin of 9.9 which is a chronic finding, her initial troponin is 22, pending delta troponins at this time.  Magnesium  is normal at 2.1, her urinalysis significant for large leukocytes, 0-5 RBCs with few bacteria.  Patient denies dysuria, urine culture has been ordered. Radiology: ordered.    Details: Chest x-ray reviewed, cardiomegaly,  atelectasis. ECG/medicine tests: ordered.    Details: A-fib rate 80.  Risk Prescription drug management. Decision regarding hospitalization.   CRITICAL CARE Performed by: Primus Gritton Total critical care time: 50 minutes Critical care time was exclusive of separately billable procedures and treating other patients. Critical care was necessary to treat or prevent imminent or life-threatening deterioration. Critical care was time spent personally by me on the following activities: development of treatment plan with patient and/or surrogate as well as nursing, discussions with consultants, evaluation of patient's response to treatment, examination of patient, obtaining history from patient or surrogate, ordering and performing treatments and interventions, ordering and review of laboratory studies, ordering and review of radiographic studies, pulse oximetry and re-evaluation of patient's condition.      Final diagnoses:  Hypokalemia  Nausea vomiting and diarrhea  Ventricular fibrillation [facility])  Cardiac arrest [facility])    ED Discharge Orders     None          Birdena Mliss RIGGERS 04/04/24 RETHA Suzette Pac, MD 04/05/24 1255

## 2024-04-04 NOTE — Progress Notes (Addendum)
 Had hospitalist consulted to admit patient to [facility] stepdown, patient presents with nausea, vomiting and diarrhea, she is on Multaq  and Xarelto for A-fib, in ED she sustained V-fib arrest, required CPR time 1 minute, ROSC after evaluation of 200 J, her workup significant for low potassium at 2.1, most recently she took Multaq  this morning. - Her vital signs unstable for admission to stepdown unit at [facility], average MAP 50s to low 60s, will need admission to ICU as discussed with ED physician-.  - I have discussed with cardiology who will assess the patient, but for now recommendation is to hold Multaq , and no indication for amiodarone  drip or any antiarrhythmic, main goal is to replete potassium and magnesium .  cardiology will see when patient gets to [facility]. -Please see under media section her most recent vital signs and telemetry strip during V-fib event. - Lactic acid elevated at 3.7, she is on IV fluids, and was started on IV Unasyn nausea, vomiting and diarrhea to cover for possible intra-abdominal infection. Brayton Lye MD

## 2024-04-04 NOTE — ED Triage Notes (Signed)
 Pt from home bib [facility] due to n/v/d. Arrived a/o. Color gray. Moving all extremities. Hx of a fib. Pt c/o pain to bed sores.

## 2024-04-04 NOTE — ED Notes (Signed)
 Cipro dc per Dr Sherlon.

## 2024-04-04 NOTE — Progress Notes (Signed)
 [facility]Link Physician-Brief Progress Note Patient Name: Kristine Montgomery DOB: [birth year] MRN: [id number]   Date of Service  04/04/2024  HPI/Events of Note  Patient transferred from outside hospital for further evaluation of V-Fib arrest in the context of profound hypokalemia and a history of nausea, vomiting and diarrhea preceding the hospitalization.  eICU Interventions  New Patient Evaluation, orders entered.        Joshwa Hemric U Augusto Deckman 04/04/2024, 11:13 PM

## 2024-04-04 NOTE — ED Notes (Signed)
 [facility] at bedside to load pt and transport

## 2024-04-05 ENCOUNTER — Encounter (HOSPITAL_COMMUNITY): Payer: Self-pay | Admitting: Pulmonary Disease

## 2024-04-05 ENCOUNTER — Ambulatory Visit (HOSPITAL_COMMUNITY): Payer: Self-pay

## 2024-04-05 ENCOUNTER — Inpatient Hospital Stay (HOSPITAL_COMMUNITY)

## 2024-04-05 DIAGNOSIS — I4891 Unspecified atrial fibrillation: Secondary | ICD-10-CM | POA: Diagnosis not present

## 2024-04-05 DIAGNOSIS — I469 Cardiac arrest, cause unspecified: Secondary | ICD-10-CM | POA: Diagnosis not present

## 2024-04-05 DIAGNOSIS — J9691 Respiratory failure, unspecified with hypoxia: Secondary | ICD-10-CM | POA: Diagnosis not present

## 2024-04-05 DIAGNOSIS — I5043 Acute on chronic combined systolic (congestive) and diastolic (congestive) heart failure: Secondary | ICD-10-CM | POA: Diagnosis not present

## 2024-04-05 LAB — CBC WITH DIFFERENTIAL/PLATELET
Abs Immature Granulocytes: 0.08 K/uL — ABNORMAL HIGH (ref 0.00–0.07)
Basophils Absolute: 0 K/uL (ref 0.0–0.1)
Basophils Relative: 0 %
Eosinophils Absolute: 0 K/uL (ref 0.0–0.5)
Eosinophils Relative: 0 %
HCT: 34.1 % — ABNORMAL LOW (ref 36.0–46.0)
Hemoglobin: 10.1 g/dL — ABNORMAL LOW (ref 12.0–15.0)
Immature Granulocytes: 1 %
Lymphocytes Relative: 15 %
Lymphs Abs: 2 K/uL (ref 0.7–4.0)
MCH: 25.6 pg — ABNORMAL LOW (ref 26.0–34.0)
MCHC: 29.6 g/dL — ABNORMAL LOW (ref 30.0–36.0)
MCV: 86.3 fL (ref 80.0–100.0)
Monocytes Absolute: 1 K/uL (ref 0.1–1.0)
Monocytes Relative: 7 %
Neutro Abs: 10.3 K/uL — ABNORMAL HIGH (ref 1.7–7.7)
Neutrophils Relative %: 77 %
Platelets: 364 K/uL (ref 150–400)
RBC: 3.95 MIL/uL (ref 3.87–5.11)
RDW: 20.7 % — ABNORMAL HIGH (ref 11.5–15.5)
WBC: 13.5 K/uL — ABNORMAL HIGH (ref 4.0–10.5)
nRBC: 0 % (ref 0.0–0.2)

## 2024-04-05 LAB — BASIC METABOLIC PANEL WITH GFR
Anion gap: 8 (ref 5–15)
Anion gap: 3 — ABNORMAL LOW (ref 5–15)
BUN: 6 mg/dL (ref 6–20)
BUN: 5 mg/dL — ABNORMAL LOW (ref 6–20)
CO2: 32 mmol/L (ref 22–32)
CO2: 39 mmol/L — ABNORMAL HIGH (ref 22–32)
Calcium: 7.3 mg/dL — ABNORMAL LOW (ref 8.9–10.3)
Calcium: 7.3 mg/dL — ABNORMAL LOW (ref 8.9–10.3)
Chloride: 101 mmol/L (ref 98–111)
Chloride: 100 mmol/L (ref 98–111)
Creatinine, Ser: 0.63 mg/dL (ref 0.44–1.00)
Creatinine, Ser: 0.63 mg/dL (ref 0.44–1.00)
GFR, Estimated: >60 mL/min (ref >60)
GFR, Estimated: >60 mL/min (ref >60)
Glucose, Bld: 85 mg/dL (ref 70–99)
Glucose, Bld: 89 mg/dL (ref 70–99)
Potassium: 3.4 mmol/L — ABNORMAL LOW (ref 3.5–5.1)
Potassium: 3.3 mmol/L — ABNORMAL LOW (ref 3.5–5.1)
Sodium: 141 mmol/L (ref 135–145)
Sodium: 142 mmol/L (ref 135–145)

## 2024-04-05 LAB — ECHOCARDIOGRAM COMPLETE
Area-P 1/2: 4 cm2
Calc EF: 54.8 %
Height: 61 in
S' Lateral: 4.4 cm
Single Plane A2C EF: 49.8 %
Single Plane A4C EF: 59.5 %
Weight: 4038.83 [oz_av]

## 2024-04-05 LAB — PHOSPHORUS
Phosphorus: 2.5 mg/dL (ref 2.5–4.6)
Phosphorus: 2.3 mg/dL — ABNORMAL LOW (ref 2.5–4.6)
Phosphorus: 2.7 mg/dL (ref 2.5–4.6)

## 2024-04-05 LAB — D-DIMER, QUANTITATIVE: D-Dimer, Quant: 9.97 ug{FEU}/mL — ABNORMAL HIGH (ref 0.00–0.50)

## 2024-04-05 LAB — POTASSIUM: Potassium: 2.9 mmol/L — ABNORMAL LOW (ref 3.5–5.1)

## 2024-04-05 LAB — MAGNESIUM: Magnesium: 2.5 mg/dL — ABNORMAL HIGH (ref 1.7–2.4)

## 2024-04-05 LAB — LACTIC ACID, PLASMA
Lactic Acid, Venous: 2 mmol/L (ref 0.5–1.9)
Lactic Acid, Venous: 2.3 mmol/L (ref 0.5–1.9)

## 2024-04-05 LAB — MRSA NEXT GEN BY PCR, NASAL: MRSA by PCR Next Gen: NOT DETECTED

## 2024-04-05 MED ORDER — PERFLUTREN LIPID MICROSPHERE
1.0000 mL | INTRAVENOUS | Status: AC | PRN
  Administered 2024-04-05: 2 mL via INTRAVENOUS

## 2024-04-05 MED ORDER — IOHEXOL 350 MG/ML SOLN
100.0000 mL | Freq: Once | INTRAVENOUS | Status: AC | PRN
  Administered 2024-04-05: 100 mL via INTRAVENOUS

## 2024-04-05 MED ORDER — CAMPHOR-MENTHOL 0.5-0.5 % EX LOTN
TOPICAL_LOTION | CUTANEOUS | Status: DC | PRN
  Filled 2024-04-05: qty 222

## 2024-04-05 MED ORDER — DRONEDARONE HCL 400 MG PO TABS
400.0000 mg | ORAL_TABLET | Freq: Two times a day (BID) | ORAL | Status: DC
  Administered 2024-04-05 – 2024-04-18 (×26): 400 mg via ORAL
  Filled 2024-04-05 (×28): qty 1

## 2024-04-05 MED ORDER — SODIUM CHLORIDE (PF) 0.9 % IJ SOLN
INTRAMUSCULAR | Status: AC
  Filled 2024-04-05: qty 50

## 2024-04-05 MED ORDER — POTASSIUM CHLORIDE CRYS ER 20 MEQ PO TBCR
40.0000 meq | EXTENDED_RELEASE_TABLET | Freq: Once | ORAL | Status: DC
  Filled 2024-04-05: qty 2

## 2024-04-05 MED ORDER — LIDOCAINE 5 % EX PTCH
2.0000 | MEDICATED_PATCH | CUTANEOUS | Status: DC
  Administered 2024-04-05 – 2024-04-17 (×13): 2 via TRANSDERMAL
  Filled 2024-04-05 (×13): qty 2

## 2024-04-05 MED ORDER — HYDROCODONE-ACETAMINOPHEN 5-325 MG PO TABS
1.0000 | ORAL_TABLET | ORAL | Status: DC | PRN
Start: 2024-04-05 — End: 2024-04-05
  Administered 2024-04-05 (×3): 1 via ORAL
  Filled 2024-04-05 (×3): qty 1

## 2024-04-05 MED ORDER — LIDOCAINE 5 % EX PTCH
1.0000 | MEDICATED_PATCH | Freq: Once | CUTANEOUS | Status: AC
  Administered 2024-04-05: 1 via TRANSDERMAL
  Filled 2024-04-05: qty 1

## 2024-04-05 MED ORDER — FUROSEMIDE 10 MG/ML IJ SOLN
20.0000 mg | Freq: Once | INTRAMUSCULAR | Status: AC
  Administered 2024-04-05: 20 mg via INTRAVENOUS
  Filled 2024-04-05: qty 2

## 2024-04-05 MED ORDER — POTASSIUM CHLORIDE CRYS ER 20 MEQ PO TBCR: 40.0000 meq | EXTENDED_RELEASE_TABLET | Freq: Once | ORAL | Status: DC

## 2024-04-05 MED ORDER — OXYCODONE HCL 5 MG PO TABS
10.0000 mg | ORAL_TABLET | ORAL | Status: DC | PRN
  Administered 2024-04-05 – 2024-04-06 (×6): 10 mg via ORAL
  Filled 2024-04-05 (×6): qty 2

## 2024-04-05 MED ORDER — APIXABAN 5 MG PO TABS
5.0000 mg | ORAL_TABLET | Freq: Two times a day (BID) | ORAL | Status: DC
  Administered 2024-04-05 – 2024-04-18 (×27): 5 mg via ORAL
  Filled 2024-04-05 (×27): qty 1

## 2024-04-05 MED ORDER — POTASSIUM CHLORIDE 10 MEQ/100ML IV SOLN
10.0000 meq | INTRAVENOUS | Status: AC
  Administered 2024-04-05 (×4): 10 meq via INTRAVENOUS
  Filled 2024-04-05 (×4): qty 100

## 2024-04-05 MED ORDER — POTASSIUM PHOSPHATES 15 MMOLE/5ML IV SOLN
30.0000 mmol | Freq: Once | INTRAVENOUS | Status: AC
  Administered 2024-04-05: 30 mmol via INTRAVENOUS
  Filled 2024-04-05: qty 10

## 2024-04-05 MED ORDER — ATORVASTATIN CALCIUM 40 MG PO TABS
80.0000 mg | ORAL_TABLET | Freq: Every day | ORAL | Status: DC
  Administered 2024-04-05 – 2024-04-18 (×14): 80 mg via ORAL
  Filled 2024-04-05 (×14): qty 2

## 2024-04-05 MED ORDER — POTASSIUM CHLORIDE 20 MEQ PO PACK
60.0000 meq | PACK | Freq: Once | ORAL | Status: DC
  Filled 2024-04-05: qty 3

## 2024-04-05 MED ORDER — POTASSIUM & SODIUM PHOSPHATES 280-160-250 MG PO PACK
2.0000 | PACK | ORAL | Status: AC
  Administered 2024-04-05 (×3): 2 via ORAL
  Filled 2024-04-05 (×3): qty 2

## 2024-04-05 MED ORDER — FUROSEMIDE 10 MG/ML IJ SOLN: 20.0000 mg | Freq: Three times a day (TID) | INTRAMUSCULAR | Status: DC

## 2024-04-05 MED ORDER — ORAL CARE MOUTH RINSE: 15.0000 mL | OROMUCOSAL | Status: DC | PRN

## 2024-04-05 MED ORDER — HEPARIN SODIUM (PORCINE) 5000 UNIT/ML IJ SOLN: 5000.0000 [IU] | Freq: Three times a day (TID) | INTRAMUSCULAR | Status: DC

## 2024-04-05 MED ORDER — POTASSIUM CHLORIDE 20 MEQ PO PACK
40.0000 meq | PACK | Freq: Once | ORAL | Status: AC
  Administered 2024-04-05: 40 meq via ORAL
  Filled 2024-04-05: qty 2

## 2024-04-05 NOTE — Plan of Care (Signed)

## 2024-04-05 NOTE — Consult Note (Signed)
 Cardiology Consultation   Patient ID: Kristine Montgomery MRN: [id number]; DOB: 11-May-1966  Admit date: 04/04/2024 Date of Consult: 04/05/2024  PCP:  Cathe Warren BRAVO, NP    [facility] Providers Cardiologist:  Vina Gull, MD  Electrophysiologist:  Danelle Birmingham, MD      Patient Profile: Kristine Montgomery is a 58 y.o. female with a hx of tobacco abuse, hyperlipidemia, morbid obesity, paroxysmal atrial fibrillation on Eliquis , heart failure with improved LVEF, and CAD s/p DES to LAD and LCx on 07/2020 who is being seen 04/05/2024 for the evaluation of cardiac arrest at the request of Lonna Coder MD.  History of Present Illness: Kristine Montgomery is a 58 year old female with prior cardiac history listed below.  On 07/2020 the patient was hospitalized for an NSTEMI.  The patient underwent a cardiac catheterization the patient had severe multivessel CAD including 95% stenosis in the proximal LAD and LCx. She received a drug-eluting stent to the proximal LAD and a drug-eluting stent to the proximal Lcx.  Medical management was recommended for the patient's occluded RCA.  An echocardiogram was done during that hospitalization that showed a reduced LVEF of approximately 30%.  During this hospitalization the patient was also started on GDMT.  A follow-up echocardiogram was done on 11/2020 and showed the patient's LVEF had improved to 55 to 60%.  On 03/2021 the patient was hospitalized for A-fib with RVR.  She converted to sinus rhythm after she was placed on amiodarone .  Later in 2023 the patient's amiodarone  was stopped and she was started on Multaq .  On 06/2022 the patient presented to the emergency department for worsening bilateral lower extremity edema.  Patient reported compliance with her Lasix .  She was diuresed on IV Lasix .  When she was discharged home her home Lasix  dose was doubled for 3 days.  At outpatient follow-up she did report worsening lower extremity edema.  Because of this her home  Lasix  dose was increased to 40 mg daily.  An echocardiogram was done on 07/2022 because of worsening lower extremity edema and showed a normal LVEF of 55 to 60%.  Patient presented to the emergency department at [facility] for 2 days of nausea, vomiting, and diarrhea.  While awaiting labs she had an episode of VT that progressed to V-fib that required CPR for 1 minute and defibrillation x 1 and converted to sinus rhythm.  She was found to be hypokalemic with a K of 2.1 and hypocalcemic with a Ca of 7.1.  She was given potassium, magnesium , and calcium  replacement and transferred to [facility], ICU.  A bedside ultrasound was reportedly done that showed a normal LVEF.  Following the CPR the patient was reporting chest pain.  Pulmonary CTA showed nondisplaced rib fractures in ribs 3 through 6.  On interview patient reported that she came to the emergency department because she was having severe nausea vomiting and diarrhea for 4 days and could not keep any of her food down.  Prior to coming to the emergency department she denied having any chest pain, shortness of breath, or orthopnea.  Stated that she does have some lower extremity edema but that it is about her baseline.  Stated that she has not walked in 3 weeks because of her lower extremity edema.  Denies nicotine use, alcohol use, illicit substance use.  Labs showed elevated lactic acid 3.7 > 3.1 > 2.0, elevated high-sensitivity troponins 27 > 22, elevated proBNP of 3741, potassium 2.1 > 2.9 received additional oral and IV potassium  replacement, creatinine of 0.6, leukocytosis with WBC count of 13.5, anemia with a hemoglobin of 10.1, and elevated D-dimer of 9.97  EKG showed normal sinus rhythm with a rate of 75 and diffuse T wave flattening.  Pulmonary CTA showed no acute PE, small bilateral pleural effusions, lower and upper lobe consolidations felt to represent atelectasis and pneumonia, nondisplaced fractures in ribs 3 through 6, coronary artery  calcification, and aortic atherosclerosis.  Past Medical History:  Diagnosis Date   Acute combined systolic and diastolic CHF, NYHA class 4 (HCC) 07/29/2020   a. EF 30% by echo in 07/2020 b. EF 55-60% by echo in 11/2020   Arthritis    Asthma    Atrial fibrillation (HCC)    CAD (coronary artery disease)    a. 07/2020: s/p NSTEMI with DES to proxLAD and DES to proxLCx with medical management recommended of the occluded RCA.   Chronic bronchitis (HCC)    COPD (chronic obstructive pulmonary disease) (HCC)    Edema extremities    History of hiatal hernia    Hyperlipidemia LDL goal <70 07/29/2020   Ovarian cyst    Pneumonia    Rheumatoid arthritis (HCC)     Past Surgical History:  Procedure Laterality Date   CESAREAN SECTION     CORONARY STENT INTERVENTION N/A 07/29/2020   Procedure: CORONARY STENT INTERVENTION;  Surgeon: Burnard Debby LABOR, MD;  Location: [facility];  Service: Cardiovascular;  Laterality: N/A;   KNEE ARTHROSCOPY WITH MEDIAL MENISECTOMY Right 05/23/2020   Procedure: KNEE ARTHROSCOPY WITH MEDIAL AND LATERAL MENISECTOMY; 3 COMPARTMENT SYNOVECTOMY;  Surgeon: Margrette Taft BRAVO, MD;  Location: [facility];  Service: Orthopedics;  Laterality: Right;   RIGHT/LEFT HEART CATH AND CORONARY ANGIOGRAPHY N/A 07/29/2020   Procedure: RIGHT/LEFT HEART CATH AND CORONARY ANGIOGRAPHY;  Surgeon: Burnard Debby LABOR, MD;  Location: [facility] INVASIVE CV LAB;  Service: Cardiovascular;  Laterality: N/A;   TONSILLECTOMY     TUBAL LIGATION       Home Medications:  Prior to Admission medications   Medication Sig Start Date End Date Taking? Authorizing Provider  atorvastatin  (LIPITOR ) 80 MG tablet Take 0.5 tablets (40 mg total) by mouth daily. Patient taking differently: Take 80 mg by mouth daily. 11/10/23  Yes Ricky Fines, MD  dapagliflozin  propanediol (FARXIGA ) 10 MG TABS tablet Take 1 tablet (10 mg total) by mouth daily. 03/20/24  Yes Okey Vina GAILS, MD  dronedarone  (MULTAQ ) 400 MG tablet Take 1 tablet  (400 mg total) by mouth 2 (two) times daily with a meal. 06/14/23  Yes Okey Vina GAILS, MD  ELIQUIS  5 MG TABS tablet TAKE ONE TABLET BY MOUTH 2 TIMES A DAY 02/20/24  Yes Okey Vina GAILS, MD  famotidine  (PEPCID ) 40 MG tablet Take 1 tablet (40 mg total) by mouth every evening. Please keep scheduled appointment for future refills. Thank you. 11/10/23  Yes Okey Vina GAILS, MD  furosemide  (LASIX ) 20 MG tablet Take 20 mg by mouth daily. 10/06/23  Yes [provider]  gabapentin  (NEURONTIN ) 100 MG capsule Take 2 capsules (200 mg total) by mouth 3 (three) times daily. 11/10/23  Yes Ricky Fines, MD  HYDROcodone -acetaminophen  (NORCO) 10-325 MG tablet Take 1 tablet by mouth every 6 (six) hours as needed for severe pain (pain score 7-10). 11/10/23  Yes Ricky Fines, MD  potassium chloride  (KLOR-CON ) 10 MEQ tablet Take 10 mEq by mouth daily. 03/29/24  Yes [provider]  Vitamin D , Ergocalciferol , (DRISDOL) 1.25 MG (50000 UNIT) CAPS capsule Take 50,000 Units by mouth every  Saturday. 03/29/24  Yes [provider]  nitroGLYCERIN  (NITROSTAT ) 0.4 MG SL tablet Place 0.4 mg under the tongue every 5 (five) minutes as needed for chest pain. Patient not taking: Reported on 04/04/2024    [provider]    Scheduled Meds:  apixaban   5 mg Oral BID   Chlorhexidine  Gluconate Cloth  6 each Topical Daily   dronedarone   400 mg Oral BID WC   lidocaine   1 patch Transdermal Once   potassium chloride   40 mEq Oral Once   sodium chloride  flush  3 mL Intravenous Q12H   Continuous Infusions:  sodium chloride  Stopped (04/05/24 0848)   ampicillin-sulbactam (UNASYN) IV Stopped (04/05/24 0817)   PRN Meds: sodium chloride , acetaminophen , docusate sodium , HYDROcodone -acetaminophen , mouth rinse, perflutren lipid microspheres (DEFINITY) IV suspension, polyethylene glycol, sodium chloride  flush  Allergies:   No Known Allergies  Social History:   Social History   Socioeconomic History   Marital  status: Single    Spouse name: Not on file   Number of children: 2   Years of education: 12   Highest education level: Not on file  Occupational History   Not on file  Tobacco Use   Smoking status: Former    Current packs/day: 0.00    Average packs/day: 0.5 packs/day for 30.0 years (15.0 ttl pk-yrs)    Types: Cigarettes    Start date: 07/1990    Quit date: 07/2020    Years since quitting: 3.7    Passive exposure: Past   Smokeless tobacco: Never  Vaping Use   Vaping status: Never Used  Substance and Sexual Activity   Alcohol use: Not Currently   Drug use: No   Sexual activity: Yes    Birth control/protection: Post-menopausal, Surgical    Comment: tubal  Other Topics Concern   Not on file  Social History Narrative   Are you right handed or left handed? Right   Are you currently employed ?    What is your current occupation? disabled   Do you live at home alone?yes   Who lives with you?    What type of home do you live in: 1 story or 2 story? one   Caffeine non    Social Drivers of Corporate Investment Banker Strain: Low Risk  (07/30/2020)   Overall Financial Resource Strain (CARDIA)    Difficulty of Paying Living Expenses: Not very hard  Food Insecurity: No Food Insecurity (04/04/2024)   Hunger Vital Sign    Worried About Running Out of Food in the Last Year: Never true    Ran Out of Food in the Last Year: Never true  Transportation Needs: No Transportation Needs (04/04/2024)   PRAPARE - Administrator, Civil Service (Medical): No    Lack of Transportation (Non-Medical): No  Physical Activity: Insufficiently Active (04/09/2020)   Exercise Vital Sign    Days of Exercise per Week: 1 day    Minutes of Exercise per Session: 10 min  Stress: No Stress Concern Present (04/09/2020)   [employer] of Occupational Health - Occupational Stress Questionnaire    Feeling of Stress : Not at all  Social Connections: Socially Isolated (07/30/2020)   Social  Connection and Isolation Panel    Frequency of Communication with Friends and Family: More than three times a week    Frequency of Social Gatherings with Friends and Family: More than three times a week    Attends Religious Services: Never    Production Manager of [employer]  or Organizations: No    Attends Banker Meetings: Never    Marital Status: Widowed  Intimate Partner Violence: Not At Risk (04/04/2024)   Humiliation, Afraid, Rape, and Kick questionnaire    Fear of Current or Ex-Partner: No    Emotionally Abused: No    Physically Abused: No    Sexually Abused: No    Family History:    Family History  Problem Relation Age of Onset   Cancer Paternal Grandfather        colon   Cancer Maternal Grandmother        lung   COPD Father    Non-Hodgkin's lymphoma Mother    Diabetes Mother    Cancer Mother        cervical   Congestive Heart Failure Mother    Hypertension Sister    Other Son        fluid around heart     ROS:  Please see the history of present illness.   All other ROS reviewed and negative.     Physical Exam/Data: Vitals:   04/05/24 0600 04/05/24 0630 04/05/24 0700 04/05/24 0800  BP: (!) 120/50 (!) 121/52 (!) 106/57 (!) 109/54  Pulse: 66 67 71 62  Resp: 16 17 16 19   Temp:    97.8 F (36.6 C)  TempSrc:    Oral  SpO2: 96% 94% 95% 97%  Weight:      Height:        Intake/Output Summary (Last 24 hours) at 04/05/2024 0910 Last data filed at 04/05/2024 0856 Gross per 24 hour  Intake 2255 ml  Output --  Net 2255 ml      04/05/2024    1:00 AM 04/04/2024   11:00 PM 04/04/2024    7:24 PM  Last 3 Weights  Weight (lbs) 252 lb 6.8 oz 252 lb 6.8 oz 264 lb 8.8 oz  Weight (kg) 114.5 kg 114.5 kg 120 kg     Body mass index is 47.7 kg/m.  General:  Well nourished, well developed, in no acute distress.  HEENT: normal Neck: no JVD Vascular: No carotid bruits; Distal pulses 2+ bilaterally Cardiac:  normal S1, S2; RRR; no murmur  Lungs:  clear to  auscultation bilaterally, no wheezing, rhonchi or rales  Abd: soft, nontender, no hepatomegaly  Ext: no edema Musculoskeletal:  No deformities. Skin: warm and dry  Neuro:  no focal abnormalities noted Psych:  Normal affect   EKG:  The EKG was personally reviewed and demonstrates:  normal sinus rhythm with a rate of 75 and diffuse T wave flattening. Telemetry:  Telemetry was personally reviewed and demonstrates: Normal sinus rhythm with heart rates in the 60s to 80s.  Patient does have few PACs and PVCs.  There were 2-3 runs of NSVT that lasted 3 beats.  Relevant CV Studies: Echo pending  Laboratory Data: High Sensitivity Troponin:  No results for input(s): TROPONINIHS in the last 720 hours.   Chemistry Recent Labs  Lab 04/04/24 1600 04/04/24 1744 04/05/24 0023  NA 144  --   --   K 2.1*  --  2.9*  CL 98  --   --   CO2 40*  --   --   GLUCOSE 96  --   --   BUN 7  --   --   CREATININE 0.60  --   --   CALCIUM  7.1*  --   --   MG  --  2.1  --   GFRNONAA >60  --   --  ANIONGAP 6  --   --     Recent Labs  Lab 04/04/24 1600  PROT 6.5  ALBUMIN  2.0*  AST 37  ALT 10  ALKPHOS 101  BILITOT 0.5   Lipids No results for input(s): CHOL, TRIG, HDL, LABVLDL, LDLCALC, CHOLHDL in the last 168 hours.  Hematology Recent Labs  Lab 04/04/24 1600 04/05/24 0023  WBC 10.4 13.5*  RBC 3.84* 3.95  HGB 9.9* 10.1*  HCT 32.5* 34.1*  MCV 84.6 86.3  MCH 25.8* 25.6*  MCHC 30.5 29.6*  RDW 20.7* 20.7*  PLT 434* 364   Thyroid  No results for input(s): TSH, FREET4 in the last 168 hours.  BNP Recent Labs  Lab 04/04/24 1600  PROBNP 3,741.0*    DDimer  Recent Labs  Lab 04/05/24 0023  DDIMER 9.97*    Radiology/Studies:  CT Angio Chest Pulmonary Embolism (PE) W or WO Contrast Result Date: 04/05/2024 EXAM: CTA CHEST 04/05/2024 04:26:20 AM TECHNIQUE: CTA of the chest was performed after the administration of 100 mL of iohexol  (OMNIPAQUE ) 350 MG/ML injection. Multiplanar  reformatted images are provided for review. MIP images are provided for review. Automated exposure control, iterative reconstruction, and/or weight based adjustment of the mA/kV was utilized to reduce the radiation dose to as low as reasonably achievable. COMPARISON: Portable chest x-ray 04/05/2024 04:26:20 AM reported separately. CT abdomen and pelvis 08/06/2016. CLINICAL HISTORY: 58 year old female. Pulmonary embolism (PE) suspected, low to intermediate probability, positive D-dimer. FINDINGS: AORTA: Thoracic aortic calcified atherosclerosis. No thoracic aortic dissection. No aneurysm. MEDIASTINUM: Adequate pulmonary artery contrast timing. Advanced calcified coronary artery atherosclerosis and/or stent series 5 image 127. Heart size is at the upper limits of normal. No pericardial effusion. Mild mediastinal lipomatosis. LYMPH NODES: No mediastinal, hilar or axillary lymphadenopathy. LUNGS AND PLEURA: Necklace artifact at the thoracic inlet. Mild respiratory motion. Atelectatic changes to the major airways which remain patent. Small bilateral pleural effusions, mostly affecting the lower lobes, with superimposed streaky and confluent bilateral lower lobe opacity which in part is enhancing consolidation. Similar dependent upper lobe enhancing consolidation (series 4 x 35). Streaky peribronchial opacity also in the right upper lobe apex. Similar streaky opacity in the lingula. Trace paraseptal emphysema in the anterior left upper lobe. Additional pulmonary mosaic attenuation which appears to be gas trapping in the left lung. No pneumothorax. No acute pulmonary embolism. UPPER ABDOMEN: Visible upper abdomen appears stable and negative. SOFT TISSUES AND BONES: Hyperostosis in the thoracic spine results in multilevel thoracic interbody ankylosis. Nondisplaced left anterior rib fractures at the 3rd through 6th rib levels are age indeterminate. Posterior right 8th and 9th rib fractures appear chronic. No acute soft  tissue abnormality. IMPRESSION: 1. No acute pulmonary embolism identified. 2. Small bilateral pleural effusions with superimposed upper and lower lobe dependent and enhancing consolidation. Streaky peribronchial opacity and gas trapping elsewhere in the lungs. Favor a combination of atelectasis and pneumonia. 3. Nondisplaced and age indeterminate left anterior rib fractures 3 through 6. Query recent trauma. Chronic appearing right posterior rib fractures. 4. Calcified coronary artery and aortic atherosclerosis. Electronically signed by: Helayne Hurst MD 04/05/2024 06:03 AM EST RP Workstation: [id number]   DG Chest Port 1 View Result Date: 04/05/2024 EXAM: 1 VIEW(S) XRAY OF THE CHEST 04/05/2024 12:41:00 AM COMPARISON: 04/04/2024 CLINICAL HISTORY: Cardiac arrest with ventricular fibrillation (HCC) FINDINGS: LINES, TUBES AND DEVICES: External pacer over left hemithorax. LUNGS AND PLEURA: Marked progression of interstitial and alveolar pulmonary infiltrate most in keeping with moderate-to-severe pulmonary edema. No pneumothorax. No pleural effusion. HEART  AND MEDIASTINUM: Cardiomegaly. Calcified aorta. BONES AND SOFT TISSUES: No acute osseous abnormality. IMPRESSION: 1. Marked progression of interstitial and alveolar pulmonary infiltrate, most in keeping with moderate-to-severe pulmonary edema. 2. Cardiomegaly Electronically signed by: Dorethia Molt MD 04/05/2024 12:47 AM EST RP Workstation: [id number]   DG Chest Portable 1 View Result Date: 04/04/2024 CLINICAL DATA:  Shortness of breath EXAM: PORTABLE CHEST 1 VIEW COMPARISON:  Chest x-ray 11/05/2023 FINDINGS: The heart is enlarged. There is a band of atelectasis in the right upper lobe. The lungs are otherwise clear. There is no pleural effusion or pneumothorax. No acute fractures are seen. IMPRESSION: 1. Cardiomegaly. 2. Band of atelectasis in the right upper lobe. Electronically Signed   By: Greig Pique M.D.   On: 04/04/2024 17:23     Assessment and  Plan: LIBA HULSEY is a 58 y.o. female with a hx of tobacco abuse, hyperlipidemia, morbid obesity, paroxysmal atrial fibrillation on Eliquis , heart failure with improved LVEF, and CAD s/p DES to LAD and LCx on 07/2020 who is being seen 04/05/2024 for the evaluation of cardiac arrest at the request of Praveen Mannam MD.  VT/ VF Cardiac arrest Patient presented to the emergency department at [facility] for 2 days of nausea, vomiting, and diarrhea.  While awaiting labs she had an episode of VT that progressed to V-fib that required CPR for 1 minute and defibrillation x 1 and converted to sinus rhythm.  Photos of her rhythm are available under media.  She was found to be hypokalemic with a K of 2.1 and hypocalcemic with a Ca of 7.1.  She was given potassium, magnesium , and calcium  replacement and transferred to [facility], ICU. Patient history and lab findings are convincing that cardiac arrest was likely secondary to electrolyte abnormality. Echo pending Will discuss with MD but suspect if patient has normal LVEF on echo no ischemic evaluation is necessary at this time.  Doubt that patient needs an ICD since this appears to be a reversible cause. Plan diureses after hypokalemia resolves   Acute on chronic heart failure with improved LVEF An echocardiogram was done on 07/2022 and showed a normal LVEF of 55 to 60%. elevated proBNP of 3741 Pulmonary CTA showed no acute PE, small bilateral pleural effusions, lower and upper lobe consolidations felt to represent atelectasis and pneumonia, nondisplaced fractures in ribs 3 through 6, coronary artery calcification, and aortic atherosclerosis. Most recent weight was 114.5 kg's.  Patient's weight at outpatient office visit on 02/2024 was 121 kg's Echo pending GDMT Plan to restart Jardiance 10 mg daily.  Severe multivessel CAD s/p DES to LAD and LCx on 07/2020 Hyperlipidemia Denied having any chest pain or shortness of breath or other anginal symptoms prior  to admission.  Following CPR at the patient does have chest pain but this is likely secondary to the fractured ribs she has. Continue atorvastatin  80 mg daily No need for aspirin  given patient is on Eliquis .   Paroxysmal atrial fibrillation CHA2DS2-VASc Score = 4 [CHF History: 1, HTN History: 1, Diabetes History: 0, Stroke History: 0, Vascular Disease History: 1, Age Score: 0, Gender Score: 1].  Therefore, the patient's annual risk of stroke is 4.8 %.    Continue Eliquis  5 mg twice daily. continue Multaq .     Risk Assessment/Risk Scores:     New York  Heart Association (NYHA) Functional Class NYHA Class II  CHA2DS2-VASc Score = 4  } This indicates a 4.8% annual risk of stroke. The patient's score is based upon: CHF History: 1  HTN History: 1 Diabetes History: 0 Stroke History: 0 Vascular Disease History: 1 Age Score: 0 Gender Score: 1        For questions or updates, please contact [facility] Please consult [email or web address] for contact info under     Signed, Morse Clause, PA-C  04/05/2024 9:10 AM

## 2024-04-05 NOTE — Progress Notes (Addendum)
 eLink Physician-Brief Progress Note Patient Name: Kristine Montgomery DOB: 1965/07/20 MRN: [id number]   Date of Service  04/05/2024  HPI/Events of Note  CTA negative for PE but suggestive of bilateral pneumonia.  eICU Interventions  Patient is on Unasyn .        Ziyonna Christner U Terah Robey 04/05/2024, 6:55 AM

## 2024-04-05 NOTE — Progress Notes (Signed)
 eLink Physician-Brief Progress Note Patient Name: Kristine Montgomery DOB: May 23, 1965 MRN: [id number]   Date of Service  04/05/2024  HPI/Events of Note  D-Dimer 9.97.  eICU Interventions  CTA chest r/o PE ordered.        Kristine Montgomery Kristine Montgomery 04/05/2024, 3:02 AM

## 2024-04-05 NOTE — Plan of Care (Signed)
 Potassium improved to 3.4, sinus rhythm, rib fracture pain better managed with oxycodone .

## 2024-04-05 NOTE — Progress Notes (Addendum)
 eLink Physician-Brief Progress Note Patient Name: Kristine Montgomery DOB: July 15, 1965 MRN: [id number]   Date of Service  04/05/2024  HPI/Events of Note  Patient having musculoskeletal chest pain s/p CPR.  Chronic itching  eICU Interventions  As needed oxycodone  in place Lidocaine  patch was effective, repeat Sarna lotion   2259 - Kcl  Intervention Category Minor Interventions: Routine modifications to care plan (e.g. PRN medications for pain, fever)  Audrey Eller 04/05/2024, 9:17 PM

## 2024-04-05 NOTE — Progress Notes (Signed)
  Echocardiogram 2D Echocardiogram has been performed.  Charmaine KANDICE Gaskins 04/05/2024, 8:28 AM

## 2024-04-05 NOTE — Progress Notes (Addendum)
 NAME:  Kristine Montgomery, MRN:  [id number], DOB:  [birth year], LOS: 0 ADMISSION DATE:  04/04/2024, CONSULTATION DATE:  04/04/2024 REFERRING MD:  Dr. Suzette, CHIEF COMPLAINT:  Vfib arrest   History of Present Illness:  Ms. 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 She initially presented to the Ed with 4 days of nausea, vomiting and diarrhea. She denied fevers, sick contacts, or recent travel.   While awaiting labs in [facility] ED she had an episode of VT which decompensated to Vfib requiring CPR x 1 minute and defib x 1 with return to normal sinus rhythm. She was found to be hypokalemic with potassium 2.1 and hypocalcemic with calcium  of 7.1. She was given 70 mEQ potassium, 2 mg Mg and 1 gm calcium . She was transferred to [facility] ICU for further management.  Patient when reached [name] is in sinus rhythm.  She is alert and oriented.  She complains of some soreness in the chest.  Chest x-ray showed some congestion.  Repeat labs are pending right now.  EKG did not show anything acute.  Patient states that she has coronary artery disease and congestive heart failure.  She has chronic pedal edema.  She has been bedbound in the last 4 months because of obesity and cellulitis.  Bedside point-of-care ultrasound echo is done parasternal long axis did not show any significant pericardial effusion or right heart strain.  EF appears to be normal.  She is hemodynamically stable.  Not needing any vasopressor therapy.  Pertinent  Medical History  Per above  Significant Hospital Events: Including procedures, antibiotic start and stop dates in addition to other pertinent events   11/19: Presented to [facility] ED for 2 days of n/v/d and had  Vfib arrest in the setting of hypokalemia s/p defib x 1 with return to NSR   Interim History / Subjective:  Vfib arrest at [facility] ED in the setting of hypokalemia requiring defib x 1 (200J) and 1 minute CPR. Post-arrest reportedly neuro intact.   Objective    Blood pressure (!) 91/48, pulse 66, temperature 97.9 F (36.6 C), temperature source Oral, resp. rate 13, last menstrual period 06/27/2017, SpO2 95%.        Intake/Output Summary (Last 24 hours) at 04/04/2024 1920 Last data filed at 04/04/2024 1846 Gross per 24 hour  Intake 178.97 ml  Output --  Net 178.97 ml   There were no vitals filed for this visit.  Examination: Obese, awake in no distress Complains of chest soreness Heart rate regular rate and rhythm Lungs are clear to auscultation Abdomen soft, nontender.  Lab/imaging reviewed Significant for potassium 2.9, BUN/creatinine 7/0.60 Lactic acid improved to 2.0 WBC 13.5, hemoglobin 10.1, platelets 364  CTA with no pulmonary embolism, small bilateral effusions and rib fractures   Resolved problem list   Assessment and Plan   Cardiac arrest, VT->Vfib In the setting of hypokalemia, hypocalcemia and home Multaq  for Afib. Troponins in ED 27>22.  -Appears neurologically intact - Cardiology consulted.  Awaiting formal recommendations. - Replete electrolytes.  Follow labs  Acute on chronic combined systolic and diastolic heart failure  History of CAD, atrial fibrillation History of HLD Most recent Echo 08/09/2022 EF 55-60%, grade I  diastolic dysfunction, and normal RV. BNP in ED 3741. - Resume home Multaq , Eliquis  - Resume Lasix  once potassium is normalized - Repeat echo pending  Hypoxic Respiratory Failure  due to pulmonary congestion Continue oxygen  via high flow nasal cannula currently on 5 L/min flow. Wean down oxygen  as tolerated.  On Unasyn for possible aspiration.  Low threshold to discontinue antibiotic  Lactic acidosis  In the setting of above and  likely hypovolemia due to GI losses.  - Follow  Nausea/vomiting/diarrhea - Send GI pathogen panel  - Supportive care   Possible UTI UA in ED with large leukocytes, negative nitries, 21-50 WBC, and few bacteria. Denies dysuria or pelvic pain.  - Follow-up urine culture  Full Code  Critical care time:    The patient is critically ill with multiple organ system failure and requires high complexity decision making for assessment and support, frequent evaluation and titration of therapies, advanced monitoring, review of radiographic studies and interpretation of complex data.   Critical Care Time devoted to patient care services, exclusive of separately billable procedures, described in this note is 35 minutes.   Haeven Nickle MD [facility] See Amion for pager  If no response to pager , please call [phone] until 7pm After 7:00 pm call Elink  [phone] 04/05/2024, 7:59 AM

## 2024-04-05 NOTE — Progress Notes (Signed)
 eLink Physician-Brief Progress Note Patient Name: Kristine Montgomery DOB: 11/30/1965 MRN: [id number]   Date of Service  04/05/2024  HPI/Events of Note  Patient having musculoskeletal chest pain s/p CPR.  eICU Interventions  Stat portable CXR ordered to r/o sternal fracture and PRN Norco ordered.        [name] U Amore Grater 04/05/2024, 12:20 AM

## 2024-04-05 NOTE — H&P (Addendum)
 NAME:  Kristine Montgomery, MRN:  [id number], DOB:  01-Oct-1965, LOS: 0 ADMISSION DATE:  04/04/2024, CONSULTATION DATE:  04/04/2024 REFERRING MD:  Dr. 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 She initially presented to the Ed with 4 days of nausea, vomiting and diarrhea. She denied fevers, sick contacts, or recent travel.   While awaiting labs in [facility] ED she had an episode of VT which decompensated to Vfib requiring CPR x 1 minute and defib x 1 with return to normal sinus rhythm. She was found to be hypokalemic with potassium 2.1 and hypocalcemic with calcium  of 7.1. She was given 70 mEQ potassium, 2 mg Mg and 1 gm calcium . She was transferred to [facility] ICU for further management.  Patient when reached Kristine Montgomery he is in sinus rhythm.  She is alert and oriented.  She complains of some soreness in the chest.  Chest x-ray showed some congestion.  Repeat labs are pending right now.  EKG did not show anything acute.  Patient states that she has coronary artery disease and congestive heart failure.  She has chronic pedal edema.  She has been bedbound in the last 4 months because of obesity and cellulitis.  Bedside point-of-care ultrasound echo is done parasternal Montgomery axis did not show any significant pericardial effusion or right heart strain.  EF appears to be normal.  She is hemodynamically stable.  Not needing any vasopressor therapy.  Pertinent  Medical History  Per above  Significant Hospital Events: Including procedures, antibiotic start and stop dates in addition to other pertinent events   11/19: Presented to [facility] ED for 2 days of n/v/d and had  Vfib arrest in the setting of hypokalemia s/p defib x 1 with return to NSR   Interim History / Subjective:  Vfib arrest at [facility] ED in the setting of hypokalemia requiring defib x 1 (200J) and 1 minute CPR. Post-arrest reportedly neuro intact.   Objective    Blood pressure (!) 91/48, pulse 66, temperature 97.9 F (36.6 C), temperature source Oral, resp. rate 13, last menstrual period 06/27/2017, SpO2 95%.        Intake/Output Summary (Last 24 hours) at 04/04/2024 1920 Last data filed at 04/04/2024 1846 Gross per 24 hour  Intake 178.97 ml  Output --  Net 178.97 ml   There were no vitals filed for this visit.  Examination: Physical exam: General morbidly obese patient.  No distress.  Complains of soreness in the chest but CPR is done.  Hemodynamically stable no respiratory distress. HEENT: Kristine Montgomery/AT, eyes anicteric.  moist mucus membranes Neuro: Alert, awake following commands Chest: Coarse breath sounds, no wheezes or rhonchi Heart: Regular rate and rhythm, no murmurs or gallops Abdomen: Soft, nontender, nondistended, bowel sounds present Extremities: Bilateral pedal edema.   Resolved problem list   Assessment and Plan   Cardiac arrest, VT->Vfib In the setting of hypokalemia, hypocalcemia and home Multaq  for Afib. Troponins in ED 27>22.  - Cardiology consulted, appreciate recommendations  - Holding home Multaq  - Aggressive electrolyte repletion, recheck BMP and Mg now  - Continue home Eliquis   Acute on chronic combined systolic and  diastolic heart failure  History of CAD History of HLD Most recent Echo 08/09/2022 EF 55-60%, grade I diastolic dysfunction, and normal RV. BNP in ED 3741. - Repeat Echo - Hold home dapa for now - Restart lasix  in the am as potassium normalizes. - Continue home statin  - Repeat chest x-ray showed pulmonary congestion no pneumothorax. - Repeat EKG did not show anything acute  Hypoxic Respiratory Failure  due to pulmonary  congestion Continue oxygen  via high flow nasal cannula currently on 5 L/min flow. Use Lasix  once potassium levels normalized.  Lactic acidosis  In the setting of above and likely hypovolemia due to GI losses.  - Continue to trend   Nausea/vomiting/diarrhea - Send GI pathogen panel  - Supportive care   Possible UTI UA in ED with large leukocytes, negative nitries, 21-50 WBC, and few bacteria. Denies dysuria or pelvic pain.  - Follow-up urine culture, low threshold to start antibiotics if clinically worsens  Full Code Heparin  for DVT prophylaxis  Son in the room. Spoke with him and updated.  Labs   CBC: Recent Labs  Lab 04/04/24 1600  WBC 10.4  HGB 9.9*  HCT 32.5*  MCV 84.6  PLT 434*    Basic Metabolic Panel: Recent Labs  Lab 04/04/24 1600 04/04/24 1744  NA 144  --   K 2.1*  --   CL 98  --   CO2 40*  --   GLUCOSE 96  --   BUN 7  --   CREATININE 0.60  --   CALCIUM  7.1*  --   MG  --  2.1   GFR: CrCl cannot be calculated (Unknown ideal weight.). Recent Labs  Lab 04/04/24 1600 04/04/24 1744  WBC 10.4  --   LATICACIDVEN  --  3.7*    Liver Function Tests: Recent Labs  Lab 04/04/24 1600  AST 37  ALT 10  ALKPHOS 101  BILITOT 0.5  PROT 6.5  ALBUMIN  2.0*   Recent Labs  Lab 04/04/24 1600  LIPASE <10*   No results for input(s): AMMONIA in the last 168 hours.  ABG    Component Value Date/Time   PHART 7.384 07/29/2020 1436   PCO2ART 50.3 (H) 07/29/2020 1436   PO2ART 83 07/29/2020 1436   HCO3 30.8 (H) 07/29/2020 1440   TCO2 32 07/29/2020 1440   O2SAT 71.0 07/29/2020 1440     Coagulation Profile: No results for input(s): INR, PROTIME in the last 168 hours.  Cardiac Enzymes: No results for input(s): CKTOTAL, CKMB, CKMBINDEX, TROPONINI in the last 168 hours.  HbA1C: Hgb A1c MFr Bld  Date/Time Value Ref Range Status  02/18/2022 03:17 PM 5.1 4.8 - 5.6 % Final    Comment:    (NOTE) Pre diabetes:          5.7%-6.4%  Diabetes:               >6.4%  Glycemic control for   <7.0% adults with diabetes   03/24/2021 01:36 PM 5.3 4.8 - 5.6 % Final    Comment:    (NOTE) Pre diabetes:          5.7%-6.4%  Diabetes:              >6.4%  Glycemic control for   <7.0% adults with diabetes     CBG: No results for input(s): GLUCAP in the last 168 hours.  Review of Systems:   12 point review of systems is done which is negative finding as mentioned in HPI.  Past Medical History:  She,  has a past medical history of Acute combined systolic and diastolic CHF, NYHA class 4 (HCC) (07/29/2020), Arthritis, Asthma, Atrial fibrillation (HCC), CAD (coronary artery disease), Chronic bronchitis (HCC), COPD (chronic obstructive pulmonary disease) (HCC), Edema extremities, History of hiatal hernia, Hyperlipidemia LDL goal <70 (07/29/2020), Ovarian cyst, Pneumonia, and Rheumatoid arthritis (HCC).   Surgical History:   Past Surgical History:  Procedure Laterality Date   CESAREAN SECTION     CORONARY STENT INTERVENTION N/A 07/29/2020   Procedure: CORONARY STENT INTERVENTION;  Surgeon: Burnard Debby LABOR, MD;  Location: [facility] INVASIVE CV LAB;  Service: Cardiovascular;  Laterality: N/A;   KNEE ARTHROSCOPY WITH MEDIAL MENISECTOMY Right 05/23/2020   Procedure: KNEE ARTHROSCOPY WITH MEDIAL AND LATERAL MENISECTOMY; 3 COMPARTMENT SYNOVECTOMY;  Surgeon: Margrette Taft BRAVO, MD;  Location: [facility] ORS;  Service: Orthopedics;  Laterality: Right;   RIGHT/LEFT HEART CATH AND CORONARY ANGIOGRAPHY N/A 07/29/2020   Procedure: RIGHT/LEFT HEART CATH AND CORONARY ANGIOGRAPHY;  Surgeon: Burnard Debby LABOR, MD;  Location: [facility] INVASIVE CV LAB;  Service: Cardiovascular;  Laterality: N/A;   TONSILLECTOMY     TUBAL LIGATION       Social History:   reports that she quit smoking about 3 years ago. Her smoking use included cigarettes. She started smoking about 33 years ago. She has a 15 pack-year smoking history. She has been exposed to tobacco smoke. She has never used  smokeless tobacco. She reports that she does not currently use alcohol. She reports that she does not use drugs.   Family History:  Her family history includes COPD in her father; Cancer in her maternal grandmother, mother, and paternal grandfather; Congestive Heart Failure in her mother; Diabetes in her mother; Hypertension in her sister; Non-Hodgkin's lymphoma in her mother; Other in her son.   Allergies No Known Allergies   Home Medications  Prior to Admission medications   Medication Sig Start Date End Date Taking? Authorizing Provider  atorvastatin  (LIPITOR ) 80 MG tablet Take 0.5 tablets (40 mg total) by mouth daily. Patient taking differently: Take 80 mg by mouth daily. 11/10/23  Yes Ricky Fines, MD  dapagliflozin  propanediol (FARXIGA ) 10 MG TABS tablet Take 1 tablet (10 mg total) by mouth daily. 03/20/24  Yes Okey Vina GAILS, MD  dronedarone  (MULTAQ ) 400 MG tablet Take 1 tablet (400 mg total) by mouth 2 (two) times daily with a meal. 06/14/23  Yes Okey Vina GAILS, MD  ELIQUIS  5 MG TABS tablet TAKE ONE TABLET BY MOUTH 2 TIMES A DAY 02/20/24  Yes Okey Vina GAILS, MD  famotidine  (PEPCID ) 40 MG tablet Take 1 tablet (40 mg total) by mouth every evening. Please keep scheduled appointment for future refills. Thank you. 11/10/23  Yes Okey Vina GAILS, MD  furosemide  (LASIX ) 20 MG tablet Take 20 mg by mouth daily. 10/06/23  Yes [provider]  gabapentin  (NEURONTIN ) 100 MG capsule Take 2 capsules (200 mg total) by mouth 3 (three) times daily. 11/10/23  Yes Ricky Fines, MD  HYDROcodone -acetaminophen  (NORCO) 10-325 MG tablet Take 1 tablet by mouth every 6 (six) hours as needed for severe pain (pain score 7-10). 11/10/23  Yes Ricky Fines, MD  potassium chloride  (KLOR-CON ) 10 MEQ tablet Take 10 mEq by mouth daily. 03/29/24  Yes [provider]  Vitamin D , Ergocalciferol , (DRISDOL) 1.25 MG (50000 UNIT) CAPS capsule Take 50,000 Units by mouth every Saturday. 03/29/24  Yes [provider]  nitroGLYCERIN  (NITROSTAT ) 0.4 MG SL tablet Place 0.4 mg under the tongue every 5 (five)  minutes as needed for chest pain. Patient not taking: Reported on 04/04/2024    [provider]     Critical care time: 45 minutes     Tamela Stakes, MD  Attending Physician, Critical Care Medicine [facility] See Amion for pager If no response to pager, please call [phone] until 7pm After 7pm, Please call E-link [phone]

## 2024-04-05 NOTE — Progress Notes (Signed)
 eLink Physician-Brief Progress Note Patient Name: Kristine Montgomery DOB: 08/16/1965 MRN: [id number]   Date of Service  04/05/2024  HPI/Events of Note  K+ 2.9, MAP 73.  eICU Interventions  K+ replaced per electrolytes replacement protocol.        Shadrach Bartunek U Novah Nessel 04/05/2024, 2:31 AM

## 2024-04-05 NOTE — ED Provider Notes (Signed)
 CRITICAL CARE Performed by: Fairy Sermon Total critical care time: 45 minutes Critical care time was exclusive of separately billable procedures and treating other patients. Critical care was necessary to treat or prevent imminent or life-threatening deterioration. Critical care was time spent personally by me on the following activities: development of treatment plan with patient and/or surrogate as well as nursing, discussions with consultants, evaluation of patient's response to treatment, examination of patient, obtaining history from patient or surrogate, ordering and performing treatments and interventions, ordering and review of laboratory studies, ordering and review of radiographic studies, pulse oximetry and re-evaluation of patient's condition.   I was called for code on the [name].  When I arrived the patient was having agonal breaths and was pulseless.  Appropriate CPR was being performed by the ED nursing staff.  Patient was found to be in V-fib.  She was cardioverted.  Patient was converted to normal sinus rhythm.  She had a normal blood pressure and started breathing on her own.  Within the next minute the patient had a normal mental status exam.  Patient was then admitted to the critical care service   Sermon Fairy, MD 04/05/24 1254

## 2024-04-06 DIAGNOSIS — J9601 Acute respiratory failure with hypoxia: Secondary | ICD-10-CM | POA: Diagnosis present

## 2024-04-06 DIAGNOSIS — I469 Cardiac arrest, cause unspecified: Secondary | ICD-10-CM | POA: Diagnosis not present

## 2024-04-06 DIAGNOSIS — I4901 Ventricular fibrillation: Secondary | ICD-10-CM | POA: Diagnosis not present

## 2024-04-06 DIAGNOSIS — L8990 Pressure ulcer of unspecified site, unspecified stage: Secondary | ICD-10-CM | POA: Diagnosis present

## 2024-04-06 LAB — CBC
HCT: 33.7 % — ABNORMAL LOW (ref 36.0–46.0)
Hemoglobin: 9.7 g/dL — ABNORMAL LOW (ref 12.0–15.0)
MCH: 25.4 pg — ABNORMAL LOW (ref 26.0–34.0)
MCHC: 28.8 g/dL — ABNORMAL LOW (ref 30.0–36.0)
MCV: 88.2 fL (ref 80.0–100.0)
Platelets: 323 K/uL (ref 150–400)
RBC: 3.82 MIL/uL — ABNORMAL LOW (ref 3.87–5.11)
RDW: 20.9 % — ABNORMAL HIGH (ref 11.5–15.5)
WBC: 10.8 K/uL — ABNORMAL HIGH (ref 4.0–10.5)
nRBC: 0 % (ref 0.0–0.2)

## 2024-04-06 LAB — BASIC METABOLIC PANEL WITH GFR
Anion gap: 8 (ref 5–15)
BUN: 5 mg/dL — ABNORMAL LOW (ref 6–20)
CO2: 33 mmol/L — ABNORMAL HIGH (ref 22–32)
Calcium: 7.1 mg/dL — ABNORMAL LOW (ref 8.9–10.3)
Chloride: 101 mmol/L (ref 98–111)
Creatinine, Ser: 0.64 mg/dL (ref 0.44–1.00)
GFR, Estimated: >60 mL/min (ref >60)
Glucose, Bld: 85 mg/dL (ref 70–99)
Potassium: 3.6 mmol/L (ref 3.5–5.1)
Sodium: 141 mmol/L (ref 135–145)

## 2024-04-06 LAB — CALCIUM, IONIZED: Calcium, Ionized, Serum: 4.5 mg/dL (ref 4.5–5.6)

## 2024-04-06 LAB — PHOSPHORUS: Phosphorus: 4.2 mg/dL (ref 2.5–4.6)

## 2024-04-06 MED ORDER — KETOROLAC TROMETHAMINE 15 MG/ML IJ SOLN
30.0000 mg | Freq: Once | INTRAMUSCULAR | Status: AC
  Administered 2024-04-07: 30 mg via INTRAVENOUS
  Filled 2024-04-06: qty 2

## 2024-04-06 MED ORDER — METHOCARBAMOL 500 MG PO TABS
500.0000 mg | ORAL_TABLET | Freq: Three times a day (TID) | ORAL | Status: DC | PRN
  Administered 2024-04-07 – 2024-04-17 (×6): 500 mg via ORAL
  Filled 2024-04-06 (×6): qty 1

## 2024-04-06 MED ORDER — HYDROCODONE-ACETAMINOPHEN 10-325 MG PO TABS
1.0000 | ORAL_TABLET | Freq: Four times a day (QID) | ORAL | Status: DC | PRN
  Administered 2024-04-06 – 2024-04-17 (×18): 1 via ORAL
  Filled 2024-04-06 (×20): qty 1

## 2024-04-06 MED ORDER — POTASSIUM CHLORIDE 10 MEQ/100ML IV SOLN
10.0000 meq | INTRAVENOUS | Status: AC
  Administered 2024-04-06 (×4): 10 meq via INTRAVENOUS
  Filled 2024-04-06 (×4): qty 100

## 2024-04-06 NOTE — Progress Notes (Signed)
 Progress Note   Patient: Kristine Montgomery FMW:[id number] DOB: 07/21/1965 DOA: 04/04/2024     2 DOS: the patient was seen and examined on 04/06/2024   Brief hospital course: 58yo with h/o CAD s/p DES, HFrEF (improved from 30% to 55-60%), PAF, HLD, asthma, RA, and morbid obesity who presented on 11/19 with Vtach to decompensated vfib arrest requiring CPR and defibrillation x 1 minute with ROSC.  This was attributed to severe electrolyte disturbance with K+ 2.1 and Ca++ 7.1.  Electrolytes were repleted and she was admitted to [facility] ICU (from [facility]) for ongoing stabilization.  Cardiology consulted, given some diuresis.    Assessment & Plan Cardiac arrest with ventricular fibrillation (HCC) In the setting of hypokalemia, hypocalcemia, and home Multaq  for Afib Troponins in ED 27>22 Cardiology consulted, appreciate recommendations  Holding home Multaq  Aggressive electrolyte repletion  Continue home Eliquis  Acute on chronic combined systolic and diastolic CHF (congestive heart failure) (HCC) Coronary artery disease involving native coronary artery of native heart with unstable angina pectoris (HCC) Most recent Echo 08/09/2022 EF 55-60%, grade I diastolic dysfunction, and normal RV BNP in ED 3741 Repeat Echo unremarkable with small pericardial effusion Restart lasix  in the am as potassium normalizes. Continue home statin  Repeat chest x-ray showed pulmonary congestion no pneumothorax Repeat EKG did not show anything acute No documented hypoxia appreciated but she was started on oxygen  via high flow nasal cannula and will wean as tolerated Hold Farxiga  Pressure injury of skin Wound 04/04/24 2300 Pressure Injury Buttocks Bilateral Stage 2 -  Partial thickness loss of dermis presenting as a shallow open injury with a red, pink wound bed without slough. (Active)     Wound 04/04/24 2300 Pressure Injury Buttocks Bilateral Stage 1 -  Intact skin with non-blanchable redness of a localized area usually over a  bony prominence. (Active)   Tobacco abuse Encourage cessation.   Patch ordered Paroxysmal atrial fibrillation (HCC) Rate controlled with multaq  Continue Eliquis  Mixed hyperlipidemia Continue atorvastatin  Chronic pain syndrome I have reviewed this patient in the [place] Controlled Substances Reporting System.  She is receiving medications from two providers but appears to be taking them as prescribed. She is at high risk of opioid misuse, diversion, or overdose.  Continue gabapentin , Norco Urinary tract infection without hematuria N/V/D prior to presentation UA in ED with large leukocytes, negative nitries, 21-50 WBC, and few bacteria Denies dysuria or pelvic pain Follow-up urine culture On Unasyn  for now Morbid obesity (HCC) Body mass index is 48.45 kg/m.[name]  Weight loss should be encouraged Outpatient PCP/bariatric medicine f/u encouraged Significantly low or high BMI is associated with higher medical risk including morbidity and mortality         Consultants: PCCM Cardiology  Procedures: Echocardiogram 11/20  Antibiotics: Unasyn  11/19- Ciprofloxacin  x 1  30 Day Unplanned Readmission Risk Score    Flowsheet Row ED to Hosp-Admission (Current) from 04/04/2024 in [facility]-ICU/STEPDOWN  30 Day Unplanned Readmission Risk Score (%) 21.62 Filed at 04/06/2024 0400    This score is the patient's risk of an unplanned readmission within 30 days of being discharged (0 -100%). The score is based on dignosis, age, lab data, medications, orders, and past utilization.   Low:  0-14.9   Medium: 15-21.9   High: 22-29.9   Extreme: 30 and above           Subjective: Feeling better.  Chest is very sore.  Otherwise no specific complaints.   Objective: Vitals:   04/06/24 1535 04/06/24 1600  BP: ROLLEN)  91/42 (!) 103/43  Pulse: 84 83  Resp: 17 18  Temp:    SpO2: 92% 92%    Intake/Output Summary (Last 24 hours) at 04/06/2024 1730 Last data filed at 04/06/2024  1532 Gross per 24 hour  Intake 1714.2 ml  Output 1575 ml  Net 139.2 ml   Filed Weights   04/04/24 2300 04/05/24 0100 04/06/24 0330  Weight: 114.5 kg 114.5 kg 116.3 kg    Exam:  General:  Appears calm and comfortable and is in NAD Eyes:  normal lids, iris ENT:  grossly normal hearing, lips & tongue, mmm Cardiovascular:  RRR. No LE edema.  Respiratory:   CTA bilaterally with no wheezes/rales/rhonchi.  Normal respiratory effort. Abdomen:  soft, NT, ND Skin:  no rash or induration seen on limited exam Musculoskeletal:  grossly normal tone BUE/BLE, good ROM, no bony abnormality Psychiatric:  blunted mood and affect, speech fluent and appropriate, AOx3 Neurologic:  CN 2-12 grossly intact, moves all extremities in coordinated fashion  Data Reviewed: I have reviewed the patient's lab results since admission.  Pertinent labs for today include:   K+ 3.6 Calcium  7.1 WBC 10.8 Hgb 9.7 Urine culture >100k colonies E coli   Family Communication: None present     Code Status: Full Code    Disposition: Status is: Inpatient Remains inpatient appropriate because: ongoing monitoring     Time spent: 50 minutes  Unresulted Labs (From admission, onward)     Start     Ordered   Unscheduled  Basic metabolic panel with GFR  Tomorrow morning,   R       Question:  Specimen collection method  Answer:  Lab=Lab collect   04/06/24 1730   Unscheduled  CBC  Tomorrow morning,   R       Question:  Specimen collection method  Answer:  Lab=Lab collect   04/06/24 1730             Author: Delon Herald, MD 04/06/2024 5:30 PM  For on call review [email or web address].

## 2024-04-06 NOTE — Assessment & Plan Note (Signed)
 Continue atorvastatin 

## 2024-04-06 NOTE — Assessment & Plan Note (Addendum)
 Wound 04/04/24 2300 Pressure Injury Buttocks Bilateral Stage 2 -  Partial thickness loss of dermis presenting as a shallow open injury with a red, pink wound bed without slough. (Active)     Wound 04/04/24 2300 Pressure Injury Buttocks Bilateral Stage 1 -  Intact skin with non-blanchable redness of a localized area usually over a bony prominence. (Active)

## 2024-04-06 NOTE — Progress Notes (Signed)
 [facility] ADULT ICU REPLACEMENT PROTOCOL   The patient does apply for the [facility] Adult ICU Electrolyte Replacment Protocol based on the criteria listed below:   1.Exclusion criteria: TCTS, ECMO, Dialysis, and Myasthenia Gravis patients 2. Is GFR >/= 30 ml/min? Yes.    Patient's GFR today is >60 3. Is SCr </= 2? Yes.   Patient's SCr is 0.64 mg/dL 4. Did SCr increase >/= 0.5 in 24 hours? No. 5.Pt's weight >40kg  Yes.   6. Abnormal electrolyte(s): potassium 3.6  7. Electrolytes replaced per protocol 8.  Call MD STAT for K+ </= 2.5, Phos </= 1, or Mag </= 1 Physician:  protocol   Claretta JINNY Sharps 04/06/2024 4:15 AM

## 2024-04-06 NOTE — Evaluation (Signed)
 Occupational Therapy Evaluation Patient Details Name: Kristine Montgomery MRN: [id number] DOB: 1965-05-29 Today's Date: 04/06/2024   History of Present Illness   58 year old female who presents as transfer from [facility] ED post Vfib arrest in the setting of hypokalemia. Pt received CPR.  L anterior 3-6 rib fractures.  She initially presented to the Ed with 4 days of nausea, vomiting and diarrhea. 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 As per patient report, had been receiving HH therapy since her last rehab stay until LE edema was too concerning and they stopped; no supplemental O2 needs at baseline. Currently, patient presents with deficits outlined below (see OT Problem List for details) most significantly pain, increased body habitus, generalized muscle weakness with decreased UE ROM, coordination, balance and activity tolerance now requiring 4 ltrs O2 limiting BADL's (total A LB) and functional mobility (+2 EOB sitting) performance. Currently, patient will benefit from continued inpatient follow up therapy, <3 hours/day however patient preferring home with services- therapy will continue to assess. Patient requires continued Acute care hospital level OT services to progress safety and functional performance and allow for discharge.       If plan is discharge home, recommend the following:   Two people to help with walking and/or transfers;Two people to help with bathing/dressing/bathroom;Assistance with cooking/housework;Assistance with feeding;Assist for transportation;Help with stairs or ramp for entrance;Direct supervision/assist for medications management;Direct  supervision/assist for financial management     Functional Status Assessment   Patient has had a recent decline in their functional status and demonstrates the ability to make significant improvements in function in a reasonable and predictable amount of time.     Equipment Recommendations   Hospital bed (if home)      Precautions/Restrictions   Precautions Precautions: Fall Precaution/Restrictions Comments: L 3-6 rib fractures 2* CPR (sternal awareness) monitor O2 Restrictions Weight Bearing Restrictions Per Provider Order: No     Mobility Bed Mobility Overal bed mobility: Needs Assistance Bed Mobility: Rolling, Sidelying to Sit, Sit to Supine Rolling: Total assist, +2 for physical assistance Sidelying to sit: Total assist, +2 for physical assistance   Sit to supine: +2 for physical assistance, +2 for safety/equipment   General bed mobility comments: assist to initiate roll, to advance BLEs and to raise trunk; pain limited pt's ability to participate; pt sat EOB ~8 minutes; VCs for pursed lip breathing as SpO2 dropped to 87% on 4L O2 [place] with activity in sitting.    Transfers Overall transfer level:  (deferred due to pain and tolerance, will need maximove)                        Balance Overall balance assessment: Needs assistance Sitting-balance support: Feet unsupported, No upper extremity supported Sitting balance-Leahy Scale: Fair Sitting balance - Comments: initially required assist 2* posterior lean, then fair. Pt sat EOB ~8 minutes. Performed long arc quads, neck AROM,unable to tolerate scapular elevation 2* pain.                                   ADL either performed  or assessed with clinical judgement   ADL Overall ADL's : Needs assistance/impaired Eating/Feeding: Set up;Bed level   Grooming: Wash/dry hands;Wash/dry face;Oral care;Minimal assistance;Sitting Grooming Details (indicate cue type and reason): EOB with trunk  support Upper Body Bathing: Maximal assistance;Bed level   Lower Body Bathing: Total assistance;Bed level   Upper Body Dressing : Maximal assistance;Bed level   Lower Body Dressing: Total assistance;+2 for physical assistance;+2 for safety/equipment;Bed level               Functional mobility during ADLs: +2 for physical assistance;+2 for safety/equipment;Total assistance (moving EOB level, will need maximove) General ADL Comments: fearful of pain, requires encouragement and relaxation, simple UB self care only this session     Vision Baseline Vision/History: 0 No visual deficits              Pertinent Vitals/Pain Pain Assessment Pain Assessment: 0-10 Pain Score: 8  Facial Expression: Relaxed, neutral Body Movements: Absence of movements Muscle Tension: Relaxed Compliance with ventilator (intubated pts.): N/A Vocalization (extubated pts.): Talking in normal tone or no sound CPOT Total: 0 Pain Location: chest Pain Descriptors / Indicators: Sore, Guarding, Grimacing Pain Intervention(s): Limited activity within patient's tolerance, Monitored during session, Repositioned, Relaxation     Extremity/Trunk Assessment Upper Extremity Assessment Upper Extremity Assessment: Generalized weakness;Right hand dominant;RUE deficits/detail;LUE deficits/detail RUE Deficits / Details: tested at shoulder level due to rib fx/pain from cpr RUE Coordination: decreased fine motor;decreased gross motor LUE Deficits / Details: tested ROM only to shoulder level due to rib fx from cpr LUE Coordination: decreased fine motor;decreased gross motor   Lower Extremity Assessment Lower Extremity Assessment: Defer to PT evaluation RLE Deficits / Details: pitting edema R foot, knee ext -3/5 RLE Sensation: history of peripheral neuropathy;decreased light touch LLE Deficits / Details: pitting edema L foot, knee ext -3/5 LLE Sensation: history of peripheral neuropathy;decreased light touch   Cervical  / Trunk Assessment Cervical / Trunk Assessment: Normal;Other exceptions (body habitus)   Communication Communication Communication: No apparent difficulties   Cognition Arousal: Alert Behavior During Therapy: Anxious Cognition: No apparent impairments             OT - Cognition Comments: becomes overwhelmed easily which may impact processing                 Following commands: Intact       Cueing  General Comments   Cueing Techniques: Verbal cues  B LE +3-4 pitting edema, reports itchy skin with anti itch cream applied, SpO2 on 4 ltrs O2 dropped to 87% with EOB sitting but returned to 94% at restincreased body habitus limits reach   Exercises General Exercises - Lower Extremity Ankle Circles/Pumps: AROM, Both, 10 reps, Supine Long Arc Quad: AROM, Both, 10 reps, Seated        Home Living Family/patient expects to be discharged to:: Private residence Living Arrangements: Alone;Other (Comment) Available Help at Discharge: Personal care attendant;Family;Available 24 hours/day Type of Home: Apartment Home Access: Level entry     Home Layout: One level     Bathroom Shower/Tub: Chief Strategy Officer: Standard Bathroom Accessibility: Yes How Accessible: Accessible via walker;Other (comment) (not w/c) Home Equipment: Rollator (4 wheels);BSC/3in1;Shower seat;Wheelchair - manual;Other (comment) Lajean lift)   Additional Comments: Reports no change since last visit      Prior Functioning/Environment Prior Level of Function : Needs assist             Mobility Comments: aides uses hoyer lift for transfers to [facility],  pt does not self propel WC; reports swelling in LEs has kept her bedbound for several months ADLs Comments: assist from aide/daughter, sponge bathes    OT Problem List: Decreased strength;Decreased range of motion;Decreased activity tolerance;Impaired balance (sitting and/or standing);Decreased coordination;Cardiopulmonary status  limiting activity;Decreased knowledge of use of DME or AE;Decreased knowledge of precautions;Impaired sensation;Obesity;Impaired UE functional use;Pain;Increased edema   OT Treatment/Interventions: Self-care/ADL training;Therapeutic exercise;Neuromuscular education;Energy conservation;DME and/or AE instruction;Therapeutic activities;Patient/family education;Balance training      OT Goals(Current goals can be found in the care plan section)   Acute Rehab OT Goals Patient Stated Goal: to go home OT Goal Formulation: With patient Time For Goal Achievement: 04/20/24 Potential to Achieve Goals: Fair ADL Goals Pt Will Perform Grooming: with set-up;sitting Pt Will Perform Upper Body Bathing: with min assist;sitting Pt Will Perform Upper Body Dressing: with min assist;sitting Pt/caregiver will Perform Home Exercise Program: With theraputty;With written HEP provided;With Supervision;Increased strength;Both right and left upper extremity Additional ADL Goal #1: patient will teach back 4/5 ECT principles and sternal precautions (informal d/t rib fxs from chest compression) with min cues   OT Frequency:  Min 2X/week       AM-PAC OT 6 Clicks Daily Activity     Outcome Measure Help from another person eating meals?: A Little Help from another person taking care of personal grooming?: A Little Help from another person toileting, which includes using toliet, bedpan, or urinal?: Total Help from another person bathing (including washing, rinsing, drying)?: A Lot Help from another person to put on and taking off regular upper body clothing?: A Lot Help from another person to put on and taking off regular lower body clothing?: Total 6 Click Score: 12   End of Session Equipment Utilized During Treatment: Oxygen  Nurse Communication: Mobility status  Activity Tolerance: Patient limited by pain Patient left: in bed;with call bell/phone within reach;with bed alarm set;with SCD's reapplied  OT Visit  Diagnosis: Other abnormalities of gait and mobility (R26.89);Muscle weakness (generalized) (M62.81);Pain Pain - part of body:  (chest and ribs)                Time: 8949-8876 OT Time Calculation (min): 33 min Charges:  OT General Charges $OT Visit: 1 Visit OT Evaluation $OT Eval Moderate Complexity: 1 Mod  Alysa Duca OT/L Acute Rehabilitation Department  [phone]  04/06/2024, 12:57 PM

## 2024-04-06 NOTE — Assessment & Plan Note (Signed)
 N/V/D prior to presentation UA in ED with large leukocytes, negative nitries, 21-50 WBC, and few bacteria Denies dysuria or pelvic pain Follow-up urine culture On Unasyn  for now

## 2024-04-06 NOTE — Evaluation (Signed)
 Physical Therapy Evaluation Patient Details Name: Kristine Montgomery MRN: [id number] DOB: [birth year] Today's Date: 04/06/2024  History of Present Illness  58 year old female who presents as transfer from [facility] ED post Vfib arrest in the setting of hypokalemia. Pt received CPR.  L anterior 3-6 rib fractures.  She initially presented to the Ed with 4 days of nausea, vomiting and diarrhea. 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 Pt reports she's been non-ambulatory for several months 2* edema in BLEs. Her family and aides have been using a hoyer lift to transfer her to a WC. Today pt required +2 total assist for rolling and for sidelying to sit. She sat edge of bed ~8 minutes and performed BLE AROM and neck AROM. SpO2 dropped to 87% on 4L O2 with activity but came up to 93% with pursed lip breathing. Depending on level of care available at home, she may need continued inpatient follow up therapy, <3 hours/day.  Pt currently with functional limitations due to the deficits listed below (see PT Problem List). Pt will benefit from acute skilled PT to increase their independence and safety with mobility to allow discharge.           If plan is discharge home, recommend the following: Two people to help with walking and/or transfers;Assistance with cooking/housework;Two people to help with bathing/dressing/bathroom;Assist for transportation;Help with stairs or ramp for entrance;Direct supervision/assist for medications management   Can travel by private vehicle   No    Equipment Recommendations None recommended by PT  Recommendations for Other Services       Functional Status Assessment Patient has had a recent decline in their functional status and demonstrates the ability to make significant  improvements in function in a reasonable and predictable amount of time.     Precautions / Restrictions Precautions Precautions: Fall Precaution/Restrictions Comments: L 3-6 rib fractures 2* CPR; monitor O2 Restrictions Weight Bearing Restrictions Per Provider Order: No      Mobility  Bed Mobility Overal bed mobility: Needs Assistance Bed Mobility: Rolling, Sidelying to Sit, Sit to Supine Rolling: Total assist, +2 for physical assistance Sidelying to sit: Total assist, +2 for physical assistance   Sit to supine: +2 for physical assistance, +2 for safety/equipment   General bed mobility comments: assist to initiate roll, to advance BLEs and to raise trunk; pain limited pt's ability to participate; pt sat EOB ~8 minutes; VCs for pursed lip breathing as SpO2 dropped to 87% on 4L O2 [place] with activity in sitting.    Transfers                        Ambulation/Gait                  Stairs            Wheelchair Mobility     Tilt Bed    Modified Rankin (Stroke Patients Only)       Balance Overall balance assessment: Needs assistance Sitting-balance support: Feet unsupported, No upper extremity supported Sitting balance-Leahy Scale: Fair Sitting balance - Comments: initially required assist 2* posterior lean, then fair. Pt sat EOB ~8 minutes. Performed long arc quads, neck AROM,unable to tolerate scapular elevation 2* pain.  Pertinent Vitals/Pain Pain Assessment Pain Assessment: 0-10 Pain Score: 8  Pain Location: chest Pain Descriptors / Indicators: Sore, Guarding, Grimacing Pain Intervention(s): Limited activity within patient's tolerance, Monitored during session, Premedicated before session    Home Living Family/patient expects to be discharged to:: Private residence Living Arrangements: Alone;Other (Comment) Available Help at Discharge: Personal care attendant;Family;Available 24  hours/day Type of Home: Apartment Home Access: Level entry       Home Layout: One level Home Equipment: Rollator (4 wheels);BSC/3in1;Shower seat;Wheelchair - Careers Adviser (comment) Secondary School Teacher)      Prior Function Prior Level of Function : Needs assist             Mobility Comments: aides uses hoyer lift for transfers to [facility], pt does not self propel WC; reports swelling in LEs has kept her bedbound for several months ADLs Comments: assist from aide/daughter     Extremity/Trunk Assessment   Upper Extremity Assessment Upper Extremity Assessment: Defer to OT evaluation    Lower Extremity Assessment Lower Extremity Assessment: Generalized weakness;LLE deficits/detail;RLE deficits/detail RLE Deficits / Details: pitting edema R foot, knee ext -3/5 RLE Sensation: history of peripheral neuropathy;decreased light touch LLE Deficits / Details: pitting edema L foot, knee ext -3/5 LLE Sensation: history of peripheral neuropathy;decreased light touch    Cervical / Trunk Assessment Cervical / Trunk Assessment: Normal  Communication   Communication Communication: No apparent difficulties    Cognition Arousal: Alert Behavior During Therapy: Anxious   PT - Cognitive impairments: No apparent impairments                         Following commands: Intact       Cueing Cueing Techniques: Verbal cues     General Comments      Exercises General Exercises - Lower Extremity Ankle Circles/Pumps: AROM, Both, 10 reps, Supine Long Arc Quad: AROM, Both, 10 reps, Seated   Assessment/Plan    PT Assessment Patient needs continued PT services  PT Problem List Decreased activity tolerance;Decreased balance;Decreased strength;Decreased mobility;Obesity;Pain;Cardiopulmonary status limiting activity       PT Treatment Interventions Therapeutic exercise;Functional mobility training;Therapeutic activities;Patient/family education;Balance training    PT Goals (Current goals can  be found in the Care Plan section)  Acute Rehab PT Goals Patient Stated Goal: likes to play Bingo PT Goal Formulation: With patient Time For Goal Achievement: 04/20/24 Potential to Achieve Goals: Fair    Frequency Min 3X/week     Co-evaluation               AM-PAC PT 6 Clicks Mobility  Outcome Measure Help needed turning from your back to your side while in a flat bed without using bedrails?: Total Help needed moving from lying on your back to sitting on the side of a flat bed without using bedrails?: Total Help needed moving to and from a bed to a chair (including a wheelchair)?: Total Help needed standing up from a chair using your arms (e.g., wheelchair or bedside chair)?: Total Help needed to walk in hospital room?: Total Help needed climbing 3-5 steps with a railing? : Total 6 Click Score: 6    End of Session   Activity Tolerance: Patient limited by fatigue;Patient limited by pain Patient left: in bed;with bed alarm set;with call bell/phone within reach Nurse Communication: Mobility status;Need for lift equipment PT Visit Diagnosis: Muscle weakness (generalized) (M62.81);Other abnormalities of gait and mobility (R26.89);Pain    Time: 8951-8874 PT Time Calculation (min) (ACUTE ONLY): 37 min  Charges:   PT Evaluation $PT Eval Moderate Complexity: 1 Mod PT Treatments $Therapeutic Activity: 8-22 mins PT General Charges $$ ACUTE PT VISIT: 1 Visit         Sylvan Delon Copp PT 04/06/2024  Acute Rehabilitation Services  Office [phone]

## 2024-04-06 NOTE — Assessment & Plan Note (Addendum)
 In the setting of hypokalemia, hypocalcemia, and home Multaq  for Afib Troponins in ED 27>22 Cardiology consulted, appreciate recommendations  Holding home Multaq  Aggressive electrolyte repletion  Continue home Eliquis 

## 2024-04-06 NOTE — Assessment & Plan Note (Addendum)
 Body mass index is 48.45 kg/m.[name]  Weight loss should be encouraged Outpatient PCP/bariatric medicine f/u encouraged Significantly low or high BMI is associated with higher medical risk including morbidity and mortality

## 2024-04-06 NOTE — Hospital Course (Addendum)
 Brief Narrative:   58 year old with history of CAD status post drug-eluting stent, CHF with improved EF 60%, paroxysmal A-fib, HLD, asthma, RA, morbid obesity presented to the hospital on 11/19 for ventricular tachycardia which decompensated to ventricular fibrillation arrest requiring CPR and defibrillation for 1 minute with ROSC.  Lab work showed severe electrolyte abnormalities.  Eventually cardiology team was consulted and patient was admitted to [facility] ICU from [facility] for further stabilization.  PT eventually recommended SNF which was denied by insurance.  Patient's appeal was successful now awaiting placement.  If we are able to place it we will discharge her today.  Assessment & Plan:  Cardiac arrest with ventricular fibrillation (HCC) Severe hypokalemia Secondary to electrolyte abnormalities.   Coronary artery disease involving native coronary artery of native heart with unstable angina pectoris (HCC) Congestive heart failure with preserved EF 60% Most recent Echo 08/09/2022 EF 55-60%, grade I diastolic dysfunction, and normal RV Seen by cardiology, no further workup indicated at this time.   Tobacco abuse Quit smoking 4 years ago   Morbid obesity (HCC) Body mass index is 50.36 kg/m.[name]  Follow-up outpatient with primary care physician   Chronic diarrhea Reports intermittent diarrhea since at least July, improved with imodium .  Recommend outpatient follow-up with gastroenterologist. Labs are unremarkable.    Adjustment disorder with mixed anxiety and depressed mood Slowly improving.  Currently on BuSpar  and Lexapro .   Normocytic anemia Anemia of acute illness superimposed on anemia of chronic disease Hemoglobin stable around 8.5     Multiple rib fractures Likely related to CPR CTA with L anterior fractures of ribs 3-6, age indeterminate - not appreciated on xrays Continue lidocaine  patches and prn Norco for pain control PT/OT recommending SNF Awaiting for a bed  at SNF. She denies any pain.    Pressure injury of skin Wound 04/04/24 2300 Pressure Injury Buttocks Bilateral Stage 2 -  Partial thickness loss of dermis presenting as a shallow open injury with a red, pink wound bed without slough. (Active)     Wound 04/04/24 2300 Pressure Injury Buttocks Bilateral Stage 1 -  Intact skin with non-blanchable redness of a localized area usually over a bony prominence. (Active)  Local wound care   Chronic pain syndrome I have reviewed this patient in the [place] Controlled Substances Reporting System.  She is receiving medications from two providers but appears to be taking them as prescribed. She is at high risk of opioid misuse, diversion, or overdose.  Continue gabapentin , Norco.  No new complaints.      Paroxysmal atrial fibrillation (HCC) Rate controlled with multaq  Continue Eliquis    Mixed hyperlipidemia Continue atorvastatin    Urinary tract infection without hematuria Present on admission.  Treated with multiple days of Cipro /Unasyn  followed by Rocephin , treatment end date 11/25.     Unfortunately patient's insurance denies SNF , fast appeal initiated by the patient.  Toc on board.    DVT prophylaxis: Place TED hose Start: 04/07/24 1440 SCDs Start: 04/04/24 2303 apixaban  (ELIQUIS ) tablet 5 mg      Code Status: Full Code Family Communication:   Status is: Inpatient SNF placement  Subjective: Seen at bedside no complaints   Examination:  General exam: Appears calm and comfortable  Respiratory system: Clear to auscultation. Respiratory effort normal. Cardiovascular system: S1 & S2 heard, RRR. No JVD, murmurs, rubs, gallops or clicks. No pedal edema. Gastrointestinal system: Abdomen is nondistended, soft and nontender. No organomegaly or masses felt. Normal bowel sounds heard. Central nervous system: Alert and oriented.  No focal neurological deficits. Extremities: Symmetric 5 x 5 power. Skin: No rashes, lesions or ulcers Psychiatry:  Judgement and insight appear normal. Mood & affect appropriate.

## 2024-04-06 NOTE — Assessment & Plan Note (Deleted)
 Due to pulmonary congestion Continue oxygen  via high flow nasal cannula currently on 5 L/min flow. Use Lasix  once potassium levels normalized.

## 2024-04-06 NOTE — Assessment & Plan Note (Addendum)
 Most recent Echo 08/09/2022 EF 55-60%, grade I diastolic dysfunction, and normal RV BNP in ED 3741 Repeat Echo unremarkable with small pericardial effusion Restart lasix  in the am as potassium normalizes. Continue home statin  Repeat chest x-ray showed pulmonary congestion no pneumothorax Repeat EKG did not show anything acute No documented hypoxia appreciated but she was started on oxygen  via high flow nasal cannula and will wean as tolerated Hold Farxiga 

## 2024-04-06 NOTE — Assessment & Plan Note (Signed)
 Most recent Echo 08/09/2022 EF 55-60%, grade I diastolic dysfunction, and normal RV BNP in ED 3741 Repeat Echo unremarkable with small pericardial effusion Restart lasix  in the am as potassium normalizes. Continue home statin  Repeat chest x-ray showed pulmonary congestion no pneumothorax Repeat EKG did not show anything acute No documented hypoxia appreciated but she was started on oxygen  via high flow nasal cannula and will wean as tolerated Hold Farxiga 

## 2024-04-06 NOTE — Progress Notes (Signed)
  Progress Note  Patient Name: Kristine Montgomery Date of Encounter: 04/06/2024 [facility] Cardiologist: Vina Gull, MD   Interval Summary   Continues to report some chest soreness but no dyspnea.   Vital Signs Vitals:   04/06/24 0534 04/06/24 0600 04/06/24 0630 04/06/24 0700  BP: (!) 104/53 (!) 89/51 (!) 88/53 (!) 100/41  Pulse: 68 64 65 67  Resp: 17 14 18 14   Temp:    98.3 F (36.8 C)  TempSrc:    Axillary  SpO2: 96% 95% 99% 94%  Weight:      Height:        Intake/Output Summary (Last 24 hours) at 04/06/2024 9287 Last data filed at 04/06/2024 9360 Gross per 24 hour  Intake 1613.93 ml  Output 2075 ml  Net -461.07 ml      04/06/2024    3:30 AM 04/05/2024    1:00 AM 04/04/2024   11:00 PM  Last 3 Weights  Weight (lbs) 256 lb 6.3 oz 252 lb 6.8 oz 252 lb 6.8 oz  Weight (kg) 116.3 kg 114.5 kg 114.5 kg      Telemetry/ECG  NSR - Personally Reviewed  Physical Exam  GEN: No acute distress.   Neck: No JVD Cardiac: RRR, no murmurs, rubs, or gallops.  Respiratory: Clear to auscultation bilaterally. GI: Soft, nontender, non-distended  Kristine: No edema  Assessment & Plan  Kristine Montgomery is a 80 yoF with Hx of NSTEMI s/p prox-LAD and prox-Lcx PCI (2022), Hfrecovered EF (30% 3/22--> 55-60% 2024), pAF, obesity who presented with 5 days of nausea, vomiting, inability to take PO and found to have severe hypoK c/b Vfib arrest requiring CPR x 1 min and shock x 1. Cardiology consulted for further evaluation.  Denies any CP and no ischemic changes on ECG. No indication for ischemic evaluation at this time. TTE w/ EF 50-55%, small pericardial effusion, and IVC 3 mmHg.   #Vfib arrest in setting of hypoK #CAD c/b NSTEMI s/p PCI x 2 (2022) #Volume overload #Obesity - s/p IV lasix  20 x 1 yesterday and now evidence of contraction alkalosis; BP 80s-90s overnight but improved this AM - Hold further diuretics given alkalosis, recent GI symptoms and dehydration and LE edema could be  chronic - Discuss with pharmacy option for K supplementation given that patient has not tolerated tabs or packets - Cont Eliquis  and Dronedarone  - Home lasix  can be re-started on discharge - We will sign off; feel free to call us  back if you have any questions  For questions or updates, please contact [facility] Please consult [email or web address] for contact info under  Signed, Joelle VEAR Ren Donley, MD

## 2024-04-06 NOTE — Plan of Care (Signed)

## 2024-04-06 NOTE — Assessment & Plan Note (Signed)
 Rate controlled with multaq  Continue Eliquis 

## 2024-04-06 NOTE — Assessment & Plan Note (Signed)
-  Encourage cessation.   -Patch ordered  

## 2024-04-06 NOTE — Plan of Care (Signed)

## 2024-04-06 NOTE — TOC Initial Note (Signed)
 Transition of Care [facility]) - Initial/Assessment Note    Patient Details  Name: Kristine Montgomery MRN: [id number] Date of Birth: February 21, 1966  [facility] CM/SW Contact:    Jon ONEIDA Anon, RN Phone Number: 04/06/2024, 3:32 PM  Clinical Narrative:                 Pt admitted from home. RNCM met with pt at bedside to introduce role in DC planning. Pt states she lives in an apartment alone. States she has a son and daughter that assist and live close by. She has a HH Aide that come out daily through [facility]. Pt states she has a Nurse, adult, walker, BSC and a shower bench in the home. Pt not wanting to go to SNF at DC. Would prefer to go back home with [facility] services. Pt states she would need ambulance transportation. IP CM will continue to follow for DC planning needs.    Expected Discharge Plan: Home w Home Health Services Barriers to Discharge: Continued Medical Work up   Patient Goals and CMS Choice Patient states their goals for this hospitalization and ongoing recovery are:: To home with home health services CMS Medicare.gov Compare Post Acute Care list provided to:: Patient Choice offered to / list presented to : Patient [place] ownership interest in [facility].provided to:: Patient    Expected Discharge Plan and Services In-house Referral: NA Discharge Planning Services: CM Consult Post Acute Care Choice: Durable Medical Equipment, Home Health Living arrangements for the past 2 months: Apartment                 DME Arranged: N/A DME Agency: NA       HH Arranged: NA HH Agency: NA        Prior Living Arrangements/Services Living arrangements for the past 2 months: Apartment Lives with:: Self Patient language and need for interpreter reviewed:: Yes Do you feel safe going back to the place where you live?: Yes      Need for Family Participation in Patient Care: Yes (Comment) Care giver support system in place?: Yes (comment) Current home  services: DME Water Quality Scientist lift, W/C, Environmental Consultant, Paediatric Nurse) Criminal Activity/Legal Involvement Pertinent to Current Situation/Hospitalization: No - Comment as needed  Activities of Daily Living   ADL Screening (condition at time of admission) Independently performs ADLs?: No Does the patient have a NEW difficulty with bathing/dressing/toileting/self-feeding that is expected to last >3 days?: Yes (Initiates electronic notice to provider for possible OT consult) Does the patient have a NEW difficulty with getting in/out of bed, walking, or climbing stairs that is expected to last >3 days?: Yes (Initiates electronic notice to provider for possible PT consult) Does the patient have a NEW difficulty with communication that is expected to last >3 days?: No Is the patient deaf or have difficulty hearing?: Yes (hard of hearing, but can hear raised voices) Does the patient have difficulty seeing, even when wearing glasses/contacts?: No Does the patient have difficulty concentrating, remembering, or making decisions?: No  Permission Sought/Granted Permission sought to share information with : Family Supports, Oceanographer granted to share information with : Yes, Verbal Permission Granted  Share Information with NAME: [place], JORDAN (Daughter)  [phone]  Permission granted to share info w AGENCY: [facility]        Emotional Assessment Appearance:: Appears stated age Attitude/Demeanor/Rapport: Engaged Affect (typically observed): Accepting, Appropriate Orientation: : Oriented to Self, Oriented to Place, Oriented to  Time, Oriented  to Situation Alcohol / Substance Use: Not Applicable Psych Involvement: No (comment)  Admission diagnosis:  Ventricular fibrillation (HCC) [I49.01] Cardiac arrest (HCC) [I46.9] Hypokalemia [E87.6] Nausea vomiting and diarrhea [R11.2, R19.7] Cardiac arrest with ventricular fibrillation (HCC) [I46.9, I49.01] Patient Active Problem  List   Diagnosis Date Noted   Pressure injury of skin 04/06/2024   Acute hypoxic respiratory failure (HCC) 04/06/2024   Cardiac arrest (HCC) 04/04/2024   Cardiac arrest with ventricular fibrillation (HCC) 04/04/2024   Spinal stenosis in cervical region 12/12/2023   Weakness 11/10/2023   Hypotension 11/05/2023   Acute on chronic combined systolic and diastolic CHF (congestive heart failure) (HCC) 11/05/2023   Cellulitis 11/05/2023   Lower extremity edema 11/02/2023   Hypertensive heart disease with heart failure (HCC) 09/03/2023   Chronic diastolic (congestive) heart failure (HCC) 09/03/2023   Bilateral hearing loss 05/26/2023   Chronic pain syndrome 05/18/2023   Seropositive rheumatoid arthritis (HCC) 04/21/2022   High risk medication use 04/21/2022   Idiopathic gout of multiple sites 04/21/2022   Secondary hypercoagulable state 10/14/2021   Paroxysmal atrial fibrillation (HCC) 03/23/2021   Hypokalemia 03/23/2021   Leukocytosis 03/23/2021   Thrombocytosis 03/23/2021   Elevated troponin 03/23/2021   Hyperglycemia 03/23/2021   Ischemic cardiomyopathy 07/31/2020   Coronary artery disease involving native coronary artery of native heart with unstable angina pectoris (HCC) 07/31/2020   Nocturnal hypoxemia due to obesity 07/31/2020   Morbid obesity (HCC) 07/31/2020   Tobacco abuse 07/31/2020   Acute combined systolic and diastolic CHF, NYHA class 4 (HCC) 07/29/2020   Mixed hyperlipidemia 07/29/2020   NSTEMI (non-ST elevated myocardial infarction) (HCC) 07/29/2020   Unstable angina (HCC) 07/28/2020   S/P right knee arthroscopy 05/23/20 05/28/2020   Tear of meniscus of knee joint    Synovitis of knee    Primary osteoarthritis of right knee    Encounter for screening fecal occult blood testing 04/09/2020   Encounter for gynecological examination with Papanicolaou smear of cervix 04/09/2020   Urinary tract infection without hematuria 02/27/2020   Dysuria 02/27/2020   Urge incontinence  04/10/2018   OAB (overactive bladder) 04/10/2018   Urinary frequency 04/10/2018   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 [place] [address] Phone: [phone] Fax: [phone]     Social Drivers of Health (SDOH) Social History: SDOH Screenings   Food Insecurity: No Food Insecurity (04/04/2024)  Housing: Low Risk  (04/04/2024)  Transportation Needs: No Transportation Needs (04/04/2024)  Utilities: Not At Risk (04/04/2024)  Alcohol Screen: Low Risk  (04/09/2020)  Depression (PHQ2-9): Low Risk  (04/09/2020)  Financial Resource Strain: Low Risk  (07/30/2020)  Physical Activity: Insufficiently Active (04/09/2020)  Social Connections: Socially Isolated (07/30/2020)  Stress: No Stress Concern Present (04/09/2020)  Tobacco Use: Medium Risk (04/04/2024)   SDOH Interventions:     Readmission Risk Interventions    04/06/2024    3:16 PM 11/09/2023   11:50 AM 11/06/2023    6:27 PM  Readmission Risk Prevention Plan  Transportation Screening Complete Complete Complete  PCP or Specialist Appt within 5-7 Days Complete  Complete  Home Care Screening Complete Complete Complete  Medication Review (RN CM) Complete Complete Complete

## 2024-04-06 NOTE — Assessment & Plan Note (Signed)
 I have reviewed this patient in the [place] Controlled Substances Reporting System.  She is receiving medications from two providers but appears to be taking them as prescribed. She is at high risk of opioid misuse, diversion, or overdose.  Continue gabapentin , Norco

## 2024-04-07 DIAGNOSIS — I469 Cardiac arrest, cause unspecified: Secondary | ICD-10-CM | POA: Diagnosis not present

## 2024-04-07 DIAGNOSIS — I4901 Ventricular fibrillation: Secondary | ICD-10-CM | POA: Diagnosis not present

## 2024-04-07 DIAGNOSIS — S2249XA Multiple fractures of ribs, unspecified side, initial encounter for closed fracture: Secondary | ICD-10-CM | POA: Insufficient documentation

## 2024-04-07 LAB — CBC
HCT: 29.6 % — ABNORMAL LOW (ref 36.0–46.0)
Hemoglobin: 8.6 g/dL — ABNORMAL LOW (ref 12.0–15.0)
MCH: 25.2 pg — ABNORMAL LOW (ref 26.0–34.0)
MCHC: 29.1 g/dL — ABNORMAL LOW (ref 30.0–36.0)
MCV: 86.8 fL (ref 80.0–100.0)
Platelets: 291 K/uL (ref 150–400)
RBC: 3.41 MIL/uL — ABNORMAL LOW (ref 3.87–5.11)
RDW: 20.5 % — ABNORMAL HIGH (ref 11.5–15.5)
WBC: 11.7 K/uL — ABNORMAL HIGH (ref 4.0–10.5)
nRBC: 0 % (ref 0.0–0.2)

## 2024-04-07 LAB — URINE CULTURE: Culture: >=100000 — AB

## 2024-04-07 LAB — BASIC METABOLIC PANEL WITH GFR
Anion gap: 6 (ref 5–15)
BUN: 6 mg/dL (ref 6–20)
CO2: 35 mmol/L — ABNORMAL HIGH (ref 22–32)
Calcium: 7.4 mg/dL — ABNORMAL LOW (ref 8.9–10.3)
Chloride: 101 mmol/L (ref 98–111)
Creatinine, Ser: 0.66 mg/dL (ref 0.44–1.00)
GFR, Estimated: >60 mL/min (ref >60)
Glucose, Bld: 94 mg/dL (ref 70–99)
Potassium: 3.3 mmol/L — ABNORMAL LOW (ref 3.5–5.1)
Sodium: 142 mmol/L (ref 135–145)

## 2024-04-07 MED ORDER — SODIUM CHLORIDE 0.9 % IV SOLN
2.0000 g | INTRAVENOUS | Status: AC
  Administered 2024-04-07 – 2024-04-10 (×4): 2 g via INTRAVENOUS
  Filled 2024-04-07 (×4): qty 20

## 2024-04-07 MED ORDER — POTASSIUM CHLORIDE 10 MEQ/100ML IV SOLN
10.0000 meq | INTRAVENOUS | Status: AC
  Administered 2024-04-07 (×4): 10 meq via INTRAVENOUS
  Filled 2024-04-07 (×4): qty 100

## 2024-04-07 MED ORDER — POTASSIUM CHLORIDE 20 MEQ PO PACK
20.0000 meq | PACK | Freq: Every day | ORAL | Status: DC
  Administered 2024-04-07 – 2024-04-18 (×12): 20 meq via ORAL
  Filled 2024-04-07 (×12): qty 1

## 2024-04-07 NOTE — Assessment & Plan Note (Addendum)
 Most recent Echo 08/09/2022 EF 55-60%, grade I diastolic dysfunction, and normal RV BNP in ED 3741 Repeat Echo unremarkable with small pericardial effusion Restart lasix  at time of dc Continue home statin  Repeat chest x-ray showed pulmonary congestion no pneumothorax Repeat EKG did not show anything acute No documented hypoxia appreciated but she was started on oxygen  via high flow nasal cannula and will wean as tolerated Hold Farxiga  Has chronic LE edema, likely unrelated to current presentation; will add TED hose

## 2024-04-07 NOTE — Assessment & Plan Note (Signed)
 I have reviewed this patient in the [place] Controlled Substances Reporting System.  She is receiving medications from two providers but appears to be taking them as prescribed. She is at high risk of opioid misuse, diversion, or overdose.  Continue gabapentin , Norco

## 2024-04-07 NOTE — Assessment & Plan Note (Addendum)
 Severe presenting hypokalemia from GI losses, which led to cardiac arrest GI losses have improved, no longer having n/v/d K+ is stable but marginally low She has not tolerated KCl pills or packets  She understands that she must be willing and able to tolerate PO KCl in order to be successful at home and so to be discharged

## 2024-04-07 NOTE — Assessment & Plan Note (Addendum)
 Body mass index is 48.82 kg/m.[name]  Weight loss should be encouraged Outpatient PCP/bariatric medicine f/u encouraged Significantly low or high BMI is associated with higher medical risk including morbidity and mortality

## 2024-04-07 NOTE — Assessment & Plan Note (Signed)
 Likely related to CPR CTA with L anterior fractures of ribs 3-6, age indeterminate - not appreciated on xrays Continue lidocaine  patches and prn Norco for pain control PT?OT recommending SNF but patient is certain that she will go home with home health instead

## 2024-04-07 NOTE — Assessment & Plan Note (Addendum)
 Quit smoking 4 years ago Wean O2 No known h/o COPD, although ambulatory pulse ox could be requested if there is concern for need for home O2

## 2024-04-07 NOTE — Assessment & Plan Note (Signed)
 Rate controlled with multaq  Continue Eliquis 

## 2024-04-07 NOTE — Assessment & Plan Note (Signed)
 Continue atorvastatin 

## 2024-04-07 NOTE — Assessment & Plan Note (Addendum)
 Wound 04/04/24 2300 Pressure Injury Buttocks Bilateral Stage 2 -  Partial thickness loss of dermis presenting as a shallow open injury with a red, pink wound bed without slough. (Active)     Wound 04/04/24 2300 Pressure Injury Buttocks Bilateral Stage 1 -  Intact skin with non-blanchable redness of a localized area usually over a bony prominence. (Active)

## 2024-04-07 NOTE — Assessment & Plan Note (Addendum)
 In the setting of hypokalemia, hypocalcemia, and home Multaq  for Afib Troponins in ED 27>22 Cardiology consulted and has resumed Multaq ; they are now signed off Aggressive electrolyte repletion  Continue home Eliquis 

## 2024-04-07 NOTE — Progress Notes (Signed)
 Progress Note   Patient: Kristine Montgomery FMW:[id number] DOB: June 09, 1965 DOA: 04/04/2024     3 DOS: the patient was seen and examined on 04/07/2024   Brief hospital course: 58yo with h/o CAD s/p DES, HFrEF (improved from 30% to 55-60%), PAF, HLD, asthma, RA, and morbid obesity who presented on 11/19 with Vtach to decompensated vfib arrest requiring CPR and defibrillation x 1 minute with ROSC.  This was attributed to severe electrolyte disturbance with K+ 2.1 and Ca++ 7.1.  Electrolytes were repleted and she was admitted to [facility] ICU (from [facility]) for ongoing stabilization.  Cardiology consulted, given some diuresis.   Assessment & Plan Cardiac arrest with ventricular fibrillation (HCC) In the setting of hypokalemia, hypocalcemia, and home Multaq  for Afib Troponins in ED 27>22 Cardiology consulted and has resumed Multaq ; they are now signed off Aggressive electrolyte repletion  Continue home Eliquis  Multiple rib fractures Likely related to CPR CTA with L anterior fractures of ribs 3-6, age indeterminate - not appreciated on xrays Continue lidocaine  patches and prn Norco for pain control PT?OT recommending SNF but patient is certain that she will go home with home health instead Hypokalemia Severe presenting hypokalemia from GI losses, which led to cardiac arrest GI losses have improved, no longer having n/v/d K+ is stable but marginally low She has not tolerated KCl pills or packets  She understands that she must be willing and able to tolerate PO KCl in order to be successful at home and so to be discharged Acute on chronic combined systolic and diastolic CHF (congestive heart failure) (HCC) Coronary artery disease involving native coronary artery of native heart with unstable angina pectoris (HCC) Most recent Echo 08/09/2022 EF 55-60%, grade I diastolic dysfunction, and normal RV BNP in ED 3741 Repeat Echo unremarkable with small pericardial effusion Restart lasix  at time of dc Continue  home statin  Repeat chest x-ray showed pulmonary congestion no pneumothorax Repeat EKG did not show anything acute No documented hypoxia appreciated but she was started on oxygen  via high flow nasal cannula and will wean as tolerated Hold Farxiga  Has chronic LE edema, likely unrelated to current presentation; will add TED hose Pressure injury of skin Wound 04/04/24 2300 Pressure Injury Buttocks Bilateral Stage 2 -  Partial thickness loss of dermis presenting as a shallow open injury with a red, pink wound bed without slough. (Active)     Wound 04/04/24 2300 Pressure Injury Buttocks Bilateral Stage 1 -  Intact skin with non-blanchable redness of a localized area usually over a bony prominence. (Active)   Tobacco abuse Quit smoking 4 years ago Wean O2 No known h/o COPD, although ambulatory pulse ox could be requested if there is concern for need for home O2 Paroxysmal atrial fibrillation (HCC) Rate controlled with multaq  Continue Eliquis  Mixed hyperlipidemia Continue atorvastatin  Chronic pain syndrome I have reviewed this patient in the [place] Controlled Substances Reporting System.  She is receiving medications from two providers but appears to be taking them as prescribed. She is at high risk of opioid misuse, diversion, or overdose.  Continue gabapentin , Norco Urinary tract infection without hematuria N/V/D prior to presentation UA in ED with large leukocytes, negative nitries, 21-50 WBC, and few bacteria Denies dysuria or pelvic pain Also with concern for multifocal PNA on chest CT Urine culture with >100k colonies E coli with resistance to Ampicillin , Unasyn , and Cipro  Unasyn  -> Ceftriaxone  Morbid obesity (HCC) Body mass index is 48.82 kg/m.[name]  Weight loss should be encouraged Outpatient PCP/bariatric medicine f/u encouraged Significantly low  or high BMI is associated with higher medical risk including morbidity and mortality       Consultants: PCCM Cardiology    Procedures: Echocardiogram 11/20   Antibiotics: Unasyn  11/19- Ciprofloxacin  x 1  30 Day Unplanned Readmission Risk Score    Flowsheet Row ED to Hosp-Admission (Current) from 04/04/2024 in [facility]-ICU/STEPDOWN  30 Day Unplanned Readmission Risk Score (%) 20.45 Filed at 04/07/2024 0400    This score is the patient's risk of an unplanned readmission within 30 days of being discharged (0 -100%). The score is based on dignosis, age, lab data, medications, orders, and past utilization.   Low:  0-14.9   Medium: 15-21.9   High: 22-29.9   Extreme: 30 and above           Subjective: Feeling better.  Eager to go home when possible.  Still having significant chest wall pain but this is controlled with Lidoderm  and oxy.   Objective: Vitals:   04/07/24 1011 04/07/24 1116  BP: 96/67 107/66  Pulse: 69 68  Resp: 18 14  Temp:  98.2 F (36.8 C)  SpO2: 98% 95%    Intake/Output Summary (Last 24 hours) at 04/07/2024 1459 Last data filed at 04/07/2024 1034 Gross per 24 hour  Intake 405.67 ml  Output 400 ml  Net 5.67 ml   Filed Weights   04/05/24 0100 04/06/24 0330 04/07/24 0335  Weight: 114.5 kg 116.3 kg 117.2 kg    Exam:  General:  Appears calm and comfortable and is in NAD Eyes:  normal lids, iris ENT:  grossly normal hearing, lips & tongue, mmm Cardiovascular:  RRR. 3+ LE edema.  Respiratory:   CTA bilaterally with no wheezes/rales/rhonchi.  Normal respiratory effort. Abdomen:  soft, NT, ND Skin:  no rash or induration seen on limited exam Musculoskeletal:  grossly normal tone BUE/BLE, good ROM, no bony abnormality Psychiatric:  grossly normal mood and affect, speech fluent and appropriate, AOx3 Neurologic:  CN 2-12 grossly intact, moves all extremities in coordinated fashion  Data Reviewed: I have reviewed the patient's lab results since admission.  Pertinent labs for today include:   K+ 3.3 CO2 35 WBC 11.7 Hgb 8.6     Family Communication:  None present  Mobility: PT/OT Consulted and are recommending - Skilled Nursing-Short Term Rehab (<3 Hours/Day) (If Family And Aides Cannot Provide Needed Level Of Care)04/06/2024 1148    Code Status: Full Code   Disposition: Status is: Inpatient Remains inpatient appropriate because: ongoing monitoring     Time spent: 50 minutes  Unresulted Labs (From admission, onward)     Start     Ordered   04/08/24 0500  CBC  Tomorrow morning,   R       Question:  Specimen collection method  Answer:  Lab=Lab collect   04/07/24 1458   04/08/24 0500  Basic metabolic panel with GFR  Tomorrow morning,   R       Question:  Specimen collection method  Answer:  Lab=Lab collect   04/07/24 1458             Author: Delon Herald, MD 04/07/2024 2:59 PM  For on call review [email or web address].

## 2024-04-07 NOTE — Assessment & Plan Note (Addendum)
 N/V/D prior to presentation UA in ED with large leukocytes, negative nitries, 21-50 WBC, and few bacteria Denies dysuria or pelvic pain Also with concern for multifocal PNA on chest CT Urine culture with >100k colonies E coli with resistance to Ampicillin , Unasyn , and Cipro  Unasyn  -> Ceftriaxone 

## 2024-04-08 DIAGNOSIS — I469 Cardiac arrest, cause unspecified: Secondary | ICD-10-CM | POA: Diagnosis not present

## 2024-04-08 DIAGNOSIS — I4901 Ventricular fibrillation: Secondary | ICD-10-CM | POA: Diagnosis not present

## 2024-04-08 DIAGNOSIS — D649 Anemia, unspecified: Secondary | ICD-10-CM | POA: Insufficient documentation

## 2024-04-08 DIAGNOSIS — F4323 Adjustment disorder with mixed anxiety and depressed mood: Secondary | ICD-10-CM | POA: Insufficient documentation

## 2024-04-08 DIAGNOSIS — R7881 Bacteremia: Secondary | ICD-10-CM | POA: Insufficient documentation

## 2024-04-08 LAB — CBC
HCT: 27.4 % — ABNORMAL LOW (ref 36.0–46.0)
Hemoglobin: 8 g/dL — ABNORMAL LOW (ref 12.0–15.0)
MCH: 25.5 pg — ABNORMAL LOW (ref 26.0–34.0)
MCHC: 29.2 g/dL — ABNORMAL LOW (ref 30.0–36.0)
MCV: 87.3 fL (ref 80.0–100.0)
Platelets: 249 10*3/uL (ref 150–400)
RBC: 3.14 MIL/uL — ABNORMAL LOW (ref 3.87–5.11)
RDW: 20.4 % — ABNORMAL HIGH (ref 11.5–15.5)
WBC: 11.7 10*3/uL — ABNORMAL HIGH (ref 4.0–10.5)
nRBC: 0 % (ref 0.0–0.2)

## 2024-04-08 LAB — BASIC METABOLIC PANEL WITH GFR
Anion gap: 4 — ABNORMAL LOW (ref 5–15)
BUN: 8 mg/dL (ref 6–20)
CO2: 34 mmol/L — ABNORMAL HIGH (ref 22–32)
Calcium: 7.3 mg/dL — ABNORMAL LOW (ref 8.9–10.3)
Chloride: 102 mmol/L (ref 98–111)
Creatinine, Ser: 0.62 mg/dL (ref 0.44–1.00)
GFR, Estimated: >60 mL/min (ref >60)
Glucose, Bld: 85 mg/dL (ref 70–99)
Potassium: 3.6 mmol/L (ref 3.5–5.1)
Sodium: 141 mmol/L (ref 135–145)

## 2024-04-08 MED ORDER — BUSPIRONE HCL 5 MG PO TABS
5.0000 mg | ORAL_TABLET | Freq: Three times a day (TID) | ORAL | Status: DC
  Administered 2024-04-08 – 2024-04-18 (×27): 5 mg via ORAL
  Filled 2024-04-08 (×30): qty 1

## 2024-04-08 MED ORDER — ESCITALOPRAM OXALATE 10 MG PO TABS
5.0000 mg | ORAL_TABLET | Freq: Every day | ORAL | Status: DC
  Administered 2024-04-08 – 2024-04-18 (×11): 5 mg via ORAL
  Filled 2024-04-08 (×11): qty 1

## 2024-04-08 NOTE — Assessment & Plan Note (Addendum)
 N/V/D prior to presentation UA in ED with large leukocytes, negative nitries, 21-50 WBC, and few bacteria Denies dysuria or pelvic pain Also with concern for multifocal PNA on chest CT Urine culture with >100k colonies E coli with resistance to Ampicillin , Unasyn , and Cipro  Unasyn  -> Ceftriaxone  through 11/25

## 2024-04-08 NOTE — Assessment & Plan Note (Signed)
 Most recent Echo 08/09/2022 EF 55-60%, grade I diastolic dysfunction, and normal RV BNP in ED 3741 Repeat Echo unremarkable with small pericardial effusion Restart lasix  at time of dc Continue home statin  Repeat chest x-ray showed pulmonary congestion no pneumothorax Repeat EKG did not show anything acute No documented hypoxia appreciated but she was started on oxygen  via high flow nasal cannula and will wean as tolerated Hold Farxiga  Has chronic LE edema, likely unrelated to current presentation; will add TED hose

## 2024-04-08 NOTE — Assessment & Plan Note (Signed)
 Rate controlled with multaq  Continue Eliquis 

## 2024-04-08 NOTE — Assessment & Plan Note (Signed)
 Reports h/o mood disturbance at home Amplified by anxiety about cardiac arrest She is willing to try medication Will add Lexapro  + buspirone  (standing for now, can change to prn once Lexapro  takes effect)

## 2024-04-08 NOTE — Assessment & Plan Note (Addendum)
 Likely related to CPR CTA with L anterior fractures of ribs 3-6, age indeterminate - not appreciated on xrays Continue lidocaine  patches and prn Norco for pain control PT/OT recommending SNF Patient is now willing to consider SNF (previously declined), prefers [facility] spirometry ordered Splinting from rib pain, needs to wean off O2 when able

## 2024-04-08 NOTE — Assessment & Plan Note (Signed)
 In the setting of hypokalemia, hypocalcemia, and home Multaq  for Afib Troponins in ED 27>22 Cardiology consulted and has resumed Multaq ; they are now signed off Aggressive electrolyte repletion  Continue home Eliquis 

## 2024-04-08 NOTE — Assessment & Plan Note (Addendum)
 Body mass index is 50.36 kg/m.[name]  Weight loss should be encouraged Outpatient PCP/bariatric medicine f/u encouraged Significantly low or high BMI is associated with higher medical risk including morbidity and mortality

## 2024-04-08 NOTE — Assessment & Plan Note (Addendum)
 Severe presenting hypokalemia from GI losses, which led to cardiac arrest GI losses have improved, no longer having n/v/d K+ is stable but marginally low She has not tolerated KCl pills or packets  She understands that she must be willing and able to tolerate PO KCl in order to be successful at home and so to be discharged She is now able to tolerate KCl packets and this issue is stable

## 2024-04-08 NOTE — Assessment & Plan Note (Signed)
 Mildly worsening while here Likely associated with anemia of chronic disease and repeated blood draws No reported acute blood loss Will continue to follow

## 2024-04-08 NOTE — Assessment & Plan Note (Signed)
 Quit smoking 4 years ago Wean O2 No known h/o COPD, although ambulatory pulse ox could be requested if there is concern for need for home O2

## 2024-04-08 NOTE — Assessment & Plan Note (Addendum)
 Wound 04/04/24 2300 Pressure Injury Buttocks Bilateral Stage 2 -  Partial thickness loss of dermis presenting as a shallow open injury with a red, pink wound bed without slough. (Active)     Wound 04/04/24 2300 Pressure Injury Buttocks Bilateral Stage 1 -  Intact skin with non-blanchable redness of a localized area usually over a bony prominence. (Active)

## 2024-04-08 NOTE — Assessment & Plan Note (Signed)
 Continue atorvastatin 

## 2024-04-08 NOTE — Progress Notes (Signed)
 Progress Note   Patient: Kristine Montgomery FMW:[id number] DOB: 10/02/1965 DOA: 04/04/2024     4 DOS: the patient was seen and examined on 04/08/2024   Brief hospital course: 58yo with h/o CAD s/p DES, HFrEF (improved from 30% to 55-60%), PAF, HLD, asthma, RA, and morbid obesity who presented on 11/19 with Vtach to decompensated vfib arrest requiring CPR and defibrillation x 1 minute with ROSC.  This was attributed to severe electrolyte disturbance with K+ 2.1 and Ca++ 7.1.  Electrolytes were repleted and she was admitted to [facility] ICU (from [facility]) for ongoing stabilization.  Cardiology consulted, given some diuresis.    Assessment & Plan Cardiac arrest with ventricular fibrillation (HCC) In the setting of hypokalemia, hypocalcemia, and home Multaq  for Afib Troponins in ED 27>22 Cardiology consulted and has resumed Multaq ; they are now signed off Aggressive electrolyte repletion  Continue home Eliquis  Multiple rib fractures Likely related to CPR CTA with L anterior fractures of ribs 3-6, age indeterminate - not appreciated on xrays Continue lidocaine  patches and prn Norco for pain control PT/OT recommending SNF Patient is now willing to consider SNF (previously declined), prefers [facility] spirometry ordered Splinting from rib pain, needs to wean off O2 when able Hypokalemia Severe presenting hypokalemia from GI losses, which led to cardiac arrest GI losses have improved, no longer having n/v/d K+ is stable but marginally low She has not tolerated KCl pills or packets  She understands that she must be willing and able to tolerate PO KCl in order to be successful at home and so to be discharged She is now able to tolerate KCl packets and this issue is stable Acute on chronic combined systolic and diastolic CHF (congestive heart failure) (HCC) Coronary artery disease involving native coronary artery of native heart with unstable angina pectoris (HCC) Most recent Echo 08/09/2022 EF  55-60%, grade I diastolic dysfunction, and normal RV BNP in ED 3741 Repeat Echo unremarkable with small pericardial effusion Restart lasix  at time of dc Continue home statin  Repeat chest x-ray showed pulmonary congestion no pneumothorax Repeat EKG did not show anything acute No documented hypoxia appreciated but she was started on oxygen  via high flow nasal cannula and will wean as tolerated Hold Farxiga  Has chronic LE edema, likely unrelated to current presentation; will add TED hose Adjustment disorder with mixed anxiety and depressed mood Reports h/o mood disturbance at home Amplified by anxiety about cardiac arrest She is willing to try medication Will add Lexapro  + buspirone  (standing for now, can change to prn once Lexapro  takes effect) Pressure injury of skin Wound 04/04/24 2300 Pressure Injury Buttocks Bilateral Stage 2 -  Partial thickness loss of dermis presenting as a shallow open injury with a red, pink wound bed without slough. (Active)     Wound 04/04/24 2300 Pressure Injury Buttocks Bilateral Stage 1 -  Intact skin with non-blanchable redness of a localized area usually over a bony prominence. (Active)   Tobacco abuse Quit smoking 4 years ago Wean O2 No known h/o COPD, although ambulatory pulse ox could be requested if there is concern for need for home O2 Paroxysmal atrial fibrillation (HCC) Rate controlled with multaq  Continue Eliquis  Mixed hyperlipidemia Continue atorvastatin  Chronic pain syndrome I have reviewed this patient in the  Controlled Substances Reporting System.  She is receiving medications from two providers but appears to be taking them as prescribed. She is at high risk of opioid misuse, diversion, or overdose.  Continue gabapentin , Norco Urinary tract infection without hematuria N/V/D  prior to presentation UA in ED with large leukocytes, negative nitries, 21-50 WBC, and few bacteria Denies dysuria or pelvic pain Also with concern for  multifocal PNA on chest CT Urine culture with >100k colonies E coli with resistance to Ampicillin , Unasyn , and Cipro  Unasyn  -> Ceftriaxone  through 11/25 Normocytic anemia Mildly worsening while here Likely associated with anemia of chronic disease and repeated blood draws No reported acute blood loss Will continue to follow Morbid obesity (HCC) Body mass index is 50.36 kg/m.[name]  Weight loss should be encouraged Outpatient PCP/bariatric medicine f/u encouraged Significantly low or high BMI is associated with higher medical risk including morbidity and mortality        Consultants: PCCM Cardiology   Procedures: Echocardiogram 11/20   Antibiotics: Unasyn  11/19-22 Ciprofloxacin  x 1 Ceftriaxone  11/22-25  30 Day Unplanned Readmission Risk Score    Flowsheet Row ED to Hosp-Admission (Current) from 04/04/2024 in [place] 4TH FLOOR PROGRESSIVE CARE AND UROLOGY  30 Day Unplanned Readmission Risk Score (%) 20.58 Filed at 04/08/2024 0801    This score is the patient's risk of an unplanned readmission within 30 days of being discharged (0 -100%). The score is based on dignosis, age, lab data, medications, orders, and past utilization.   Low:  0-14.9   Medium: 15-21.9   High: 22-29.9   Extreme: 30 and above           Subjective: Still having significant rib pain.  Also with significant anxiety.  Has reported chronic depression, tried something once without relief.  Open to medications for now.   Objective: Vitals:   04/07/24 2246 04/08/24 0532  BP:  105/65  Pulse:  73  Resp:  20  Temp:  98.3 F (36.8 C)  SpO2: 93% 91%    Intake/Output Summary (Last 24 hours) at 04/08/2024 1251 Last data filed at 04/08/2024 0519 Gross per 24 hour  Intake 243 ml  Output 400 ml  Net -157 ml   Filed Weights   04/06/24 0330 04/07/24 0335 04/08/24 0500  Weight: 116.3 kg 117.2 kg 120.9 kg    Exam:  General:  Appears calm and comfortable and is in NAD Eyes:  normal lids, iris ENT:   grossly normal hearing, lips & tongue, mmm Cardiovascular:  RRR. No LE edema.  Respiratory:   CTA bilaterally with no wheezes/rales/rhonchi.  Normal respiratory effort. Abdomen:  soft, NT, ND Skin:  lidoderm  patches along chest wall Musculoskeletal:  grossly normal tone BUE/BLE, good ROM, no bony abnormality Psychiatric: anxious/labile mood and affect, speech fluent and appropriate, AOx3 Neurologic:  CN 2-12 grossly intact, moves all extremities in coordinated fashion  Data Reviewed: I have reviewed the patient's lab results since admission.  Pertinent labs for today include:   Unremarkable BMP WBC 11.7 Hgb 8     Family Communication: Daughter was present  Mobility: PT/OT Consulted and are recommending - Skilled Nursing-Short Term Rehab (<3 Hours/Day) (If Family And Aides Cannot Provide Needed Level Of Care)04/06/2024 1148    Code Status: Full Code   Disposition: Status is: Inpatient Remains inpatient appropriate because: ongoing management     Time spent: 50 minutes  Unresulted Labs (From admission, onward)     Start     Ordered   04/09/24 0500  Basic metabolic panel with GFR  Tomorrow morning,   R       Question:  Specimen collection method  Answer:  Lab=Lab collect   04/08/24 1249   04/09/24 0500  CBC  Tomorrow morning,   R  Question:  Specimen collection method  Answer:  Lab=Lab collect   04/08/24 1249             Author: Delon Herald, MD 04/08/2024 12:51 PM  For on call review [email or web address].

## 2024-04-08 NOTE — Assessment & Plan Note (Signed)
 I have reviewed this patient in the [place] Controlled Substances Reporting System.  She is receiving medications from two providers but appears to be taking them as prescribed. She is at high risk of opioid misuse, diversion, or overdose.  Continue gabapentin , Norco

## 2024-04-09 DIAGNOSIS — I469 Cardiac arrest, cause unspecified: Secondary | ICD-10-CM | POA: Diagnosis not present

## 2024-04-09 DIAGNOSIS — I4901 Ventricular fibrillation: Secondary | ICD-10-CM | POA: Diagnosis not present

## 2024-04-09 DIAGNOSIS — K529 Noninfective gastroenteritis and colitis, unspecified: Secondary | ICD-10-CM | POA: Insufficient documentation

## 2024-04-09 LAB — CBC
HCT: 32.8 % — ABNORMAL LOW (ref 36.0–46.0)
Hemoglobin: 9.5 g/dL — ABNORMAL LOW (ref 12.0–15.0)
MCH: 25.3 pg — ABNORMAL LOW (ref 26.0–34.0)
MCHC: 29 g/dL — ABNORMAL LOW (ref 30.0–36.0)
MCV: 87.5 fL (ref 80.0–100.0)
Platelets: 306 K/uL (ref 150–400)
RBC: 3.75 MIL/uL — ABNORMAL LOW (ref 3.87–5.11)
RDW: 20.2 % — ABNORMAL HIGH (ref 11.5–15.5)
WBC: 11.3 K/uL — ABNORMAL HIGH (ref 4.0–10.5)
nRBC: 0 % (ref 0.0–0.2)

## 2024-04-09 LAB — BASIC METABOLIC PANEL WITH GFR
Anion gap: 5 (ref 5–15)
BUN: 7 mg/dL (ref 6–20)
CO2: 32 mmol/L (ref 22–32)
Calcium: 7.8 mg/dL — ABNORMAL LOW (ref 8.9–10.3)
Chloride: 102 mmol/L (ref 98–111)
Creatinine, Ser: 0.6 mg/dL (ref 0.44–1.00)
GFR, Estimated: >60 mL/min (ref >60)
Glucose, Bld: 92 mg/dL (ref 70–99)
Potassium: 3.7 mmol/L (ref 3.5–5.1)
Sodium: 139 mmol/L (ref 135–145)

## 2024-04-09 MED ORDER — CALCIUM POLYCARBOPHIL 625 MG PO TABS
625.0000 mg | ORAL_TABLET | Freq: Every day | ORAL | Status: DC
  Administered 2024-04-13 – 2024-04-16 (×4): 625 mg via ORAL
  Filled 2024-04-09 (×7): qty 1

## 2024-04-09 MED ORDER — LOPERAMIDE HCL 2 MG PO CAPS
2.0000 mg | ORAL_CAPSULE | ORAL | Status: DC | PRN
  Administered 2024-04-09 – 2024-04-16 (×3): 2 mg via ORAL
  Filled 2024-04-09 (×3): qty 1

## 2024-04-09 NOTE — Progress Notes (Signed)
 Progress Note   Patient: Kristine Montgomery FMW:[id number] DOB: January 13, 1966 DOA: 04/04/2024     5 DOS: the patient was seen and examined on 04/09/2024   Brief hospital course: 58yo with h/o CAD s/p DES, HFrEF (improved from 30% to 55-60%), PAF, HLD, asthma, RA, and morbid obesity who presented on 11/19 with Vtach to decompensated vfib arrest requiring CPR and defibrillation x 1 minute with ROSC.  This was attributed to severe electrolyte disturbance with K+ 2.1 and Ca++ 7.1.  Electrolytes were repleted and she was admitted to [facility] ICU (from [facility]) for ongoing stabilization.  Cardiology consulted, given some diuresis.   Assessment & Plan Cardiac arrest with ventricular fibrillation (HCC) In the setting of hypokalemia, hypocalcemia, and home Multaq  for Afib Troponins in ED 27>22 Cardiology consulted and has resumed Multaq ; they are now signed off Aggressive electrolyte repletion, now WNL Continue home Eliquis  Multiple rib fractures Likely related to CPR CTA with L anterior fractures of ribs 3-6, age indeterminate - not appreciated on xrays Continue lidocaine  patches and prn Norco for pain control PT/OT recommending SNF Patient is now willing to consider SNF (previously declined), prefers [facility] spirometry ordered Splinting from rib pain, needs to wean off O2 when able Hypokalemia Severe presenting hypokalemia from GI losses, which led to cardiac arrest GI losses have improved, no longer having n/v/d K+ is stable but marginally low She has not tolerated KCl pills or packets  She understands that she must be willing and able to tolerate PO KCl in order to be successful at home and so to be discharged She is now able to tolerate KCl packets and this issue is stable Acute on chronic combined systolic and diastolic CHF (congestive heart failure) (HCC) Coronary artery disease involving native coronary artery of native heart with unstable angina pectoris (HCC) Most recent Echo  08/09/2022 EF 55-60%, grade I diastolic dysfunction, and normal RV BNP in ED 3741 Repeat Echo unremarkable with small pericardial effusion Restart lasix  at time of dc Continue home statin  Repeat chest x-ray showed pulmonary congestion no pneumothorax Repeat EKG did not show anything acute No documented hypoxia appreciated but she was started on oxygen  via high flow nasal cannula and will wean as tolerated Hold Farxiga  Has chronic LE edema, likely unrelated to current presentation; will add TED hose Adjustment disorder with mixed anxiety and depressed mood Reports h/o mood disturbance at home Amplified by anxiety about cardiac arrest She is willing to try medication Will add Lexapro  + buspirone  (standing for now, can change to prn once Lexapro  takes effect) Pressure injury of skin Wound 04/04/24 2300 Pressure Injury Buttocks Bilateral Stage 2 -  Partial thickness loss of dermis presenting as a shallow open injury with a red, pink wound bed without slough. (Active)     Wound 04/04/24 2300 Pressure Injury Buttocks Bilateral Stage 1 -  Intact skin with non-blanchable redness of a localized area usually over a bony prominence. (Active)   Tobacco abuse Quit smoking 4 years ago Wean O2 No known h/o COPD, although ambulatory pulse ox could be requested if there is concern for need for home O2 Paroxysmal atrial fibrillation (HCC) Rate controlled with multaq  Continue Eliquis  Mixed hyperlipidemia Continue atorvastatin  Chronic pain syndrome I have reviewed this patient in the [place] Controlled Substances Reporting System.  She is receiving medications from two providers but appears to be taking them as prescribed. She is at high risk of opioid misuse, diversion, or overdose.  Continue gabapentin , Norco Urinary tract infection without hematuria N/V/D  prior to presentation UA in ED with large leukocytes, negative nitries, 21-50 WBC, and few bacteria Denies dysuria or pelvic pain Also with concern  for multifocal PNA on chest CT Urine culture with >100k colonies E coli with resistance to Ampicillin , Unasyn , and Cipro  Unasyn  -> Ceftriaxone  through 11/25 Normocytic anemia Mildly worsening while here Likely associated with anemia of chronic disease and repeated blood draws No reported acute blood loss Now improved Chronic diarrhea Reports intermittent diarrhea since at least July 1 loose BM yesterday and 2 today Low suspicion for infectious cause Stop Colace (also has prn Miralax ) Will add fiber and prn Imodium  Morbid obesity (HCC) Body mass index is 50.36 kg/m.[name]  Weight loss should be encouraged Outpatient PCP/bariatric medicine f/u encouraged Significantly low or high BMI is associated with higher medical risk including morbidity and mortality      *Medically stable and awaiting placement in SNF.      Consultants: PCCM Cardiology   Procedures: Echocardiogram 11/20   Antibiotics: Unasyn  11/19-22 Ciprofloxacin  x 1 Ceftriaxone  11/22-25    30 Day Unplanned Readmission Risk Score    Flowsheet Row ED to Hosp-Admission (Current) from 04/04/2024 in [place] 4TH FLOOR PROGRESSIVE CARE AND UROLOGY  30 Day Unplanned Readmission Risk Score (%) 20.8 Filed at 04/09/2024 0401    This score is the patient's risk of an unplanned readmission within 30 days of being discharged (0 -100%). The score is based on dignosis, age, lab data, medications, orders, and past utilization.   Low:  0-14.9   Medium: 15-21.9   High: 22-29.9   Extreme: 30 and above           Subjective: Pain is better today.  Last walked about 4 months ago, hoping to be able to walk again after rehab.   Objective: Vitals:   04/08/24 2017 04/09/24 0512  BP: 118/71 125/70  Pulse: 72 80  Resp: 16 18  Temp: 98 F (36.7 C) (!) 97.3 F (36.3 C)  SpO2: 98% 92%    Intake/Output Summary (Last 24 hours) at 04/09/2024 1244 Last data filed at 04/09/2024 0330 Gross per 24 hour  Intake 420 ml  Output  400 ml  Net 20 ml   Filed Weights   04/06/24 0330 04/07/24 0335 04/08/24 0500  Weight: 116.3 kg 117.2 kg 120.9 kg    Exam:  General:  Appears calm and comfortable and is in NAD Eyes:  normal lids, iris ENT:  grossly normal hearing, lips & tongue, mmm Cardiovascular:  RRR. No LE edema.  Respiratory:   CTA bilaterally with no wheezes/rales/rhonchi.  Normal respiratory effort. Abdomen:  soft, NT, ND Skin:  no rash or induration seen on limited exam Musculoskeletal:  grossly normal tone BUE/BLE, good ROM, no bony abnormality Psychiatric:  grossly normal mood and affect, speech fluent and appropriate, AOx3 Neurologic:  CN 2-12 grossly intact, moves all extremities in coordinated fashion  Data Reviewed: I have reviewed the patient's lab results since admission.  Pertinent labs for today include:   Essentially normal BMP WBC 11.3 Hgb 9.5     Family Communication: Son was present  Mobility: PT/OT Consulted and are recommending - Skilled Nursing-Short Term Rehab (<3 Hours/Day) (If Family And Aides Cannot Provide Needed Level Of Care)04/06/2024 1148    Code Status: Full Code   Disposition: Status is: Inpatient Remains inpatient appropriate because: awaiting placement     Time spent: 35 minutes  Unresulted Labs (From admission, onward)    None        Author: Delon  Barbarann, MD 04/09/2024 12:44 PM  For on call review [email or web address].

## 2024-04-09 NOTE — Assessment & Plan Note (Signed)
 Likely related to CPR CTA with L anterior fractures of ribs 3-6, age indeterminate - not appreciated on xrays Continue lidocaine  patches and prn Norco for pain control PT/OT recommending SNF Patient is now willing to consider SNF (previously declined), prefers [facility] spirometry ordered Splinting from rib pain, needs to wean off O2 when able

## 2024-04-09 NOTE — Assessment & Plan Note (Signed)
 Reports h/o mood disturbance at home Amplified by anxiety about cardiac arrest She is willing to try medication Will add Lexapro  + buspirone  (standing for now, can change to prn once Lexapro  takes effect)

## 2024-04-09 NOTE — TOC Progression Note (Signed)
 Transition of Care [facility]) - Progression Note    Patient Details  Name: Kristine Montgomery MRN: [id number] Date of Birth: 21-Nov-1965  [facility] CM/SW Contact  [name] [name], [name] Phone Number: 04/09/2024, 12:46 PM  Clinical Narrative:     Received message from MD that pt has changed her mind about SNF placement. CSW met with pt at bedside to discuss rec for SNF placement. Pt is requesting [facility] and do not want [facility]. Pt agreed for information to be sent out to other facilities in the area as well. CSW discussed process noting pt will need insurance authorization. Care management to follow.    Expected Discharge Plan: Skilled Nursing Facility Barriers to Discharge: Continued Medical Work up, English As A [employer] Language Teacher, SNF Pending bed offer               Expected Discharge Plan and Services In-house Referral: NA Discharge Planning Services: CM Consult Post Acute Care Choice: Durable Medical Equipment, Home Health Living arrangements for the past 2 months: Apartment                 DME Arranged: N/A DME Agency: NA       HH Arranged: NA HH Agency: NA         Social Drivers of Health (SDOH) Interventions SDOH Screenings   Food Insecurity: No Food Insecurity (04/04/2024)  Housing: Low Risk  (04/04/2024)  Transportation Needs: No Transportation Needs (04/04/2024)  Utilities: Not At Risk (04/04/2024)  Alcohol Screen: Low Risk  (04/09/2020)  Depression (PHQ2-9): Low Risk  (04/09/2020)  Financial Resource Strain: Low Risk  (07/30/2020)  Physical Activity: Insufficiently Active (04/09/2020)  Social Connections: Socially Isolated (07/30/2020)  Stress: No Stress Concern Present (04/09/2020)  Tobacco Use: Medium Risk (04/04/2024)    Readmission Risk Interventions    04/06/2024    3:16 PM 11/09/2023   11:50 AM 11/06/2023    6:27 PM  Readmission Risk Prevention Plan  Transportation Screening Complete Complete Complete  PCP or Specialist  Appt within 5-7 Days Complete  Complete  Home Care Screening Complete Complete Complete  Medication Review (RN CM) Complete Complete Complete

## 2024-04-09 NOTE — Assessment & Plan Note (Signed)
 I have reviewed this patient in the [place] Controlled Substances Reporting System.  She is receiving medications from two providers but appears to be taking them as prescribed. She is at high risk of opioid misuse, diversion, or overdose.  Continue gabapentin , Norco

## 2024-04-09 NOTE — Assessment & Plan Note (Addendum)
 Wound 04/04/24 2300 Pressure Injury Buttocks Bilateral Stage 2 -  Partial thickness loss of dermis presenting as a shallow open injury with a red, pink wound bed without slough. (Active)     Wound 04/04/24 2300 Pressure Injury Buttocks Bilateral Stage 1 -  Intact skin with non-blanchable redness of a localized area usually over a bony prominence. (Active)

## 2024-04-09 NOTE — Progress Notes (Signed)
 Offered reposition, but patient refused turn and reposition.

## 2024-04-09 NOTE — Assessment & Plan Note (Addendum)
 In the setting of hypokalemia, hypocalcemia, and home Multaq  for Afib Troponins in ED 27>22 Cardiology consulted and has resumed Multaq ; they are now signed off Aggressive electrolyte repletion, now WNL Continue home Eliquis 

## 2024-04-09 NOTE — Progress Notes (Signed)
 PT Cancellation Note  Patient Details Name: Kristine Montgomery MRN: [id number] DOB: 04-19-1966   Cancelled Treatment:     I can't get up right now, stated Pt.  Requesting to use bed pan for diarrhea that started last night.  Pt has been evaluated with rec for SNF.  Rehab Team to continue to follow and attempt to see another day.    Katheryn Leap  PTA Acute  Rehabilitation Services Office M-F          [phone]

## 2024-04-09 NOTE — Assessment & Plan Note (Signed)
 Continue atorvastatin 

## 2024-04-09 NOTE — Plan of Care (Incomplete)
  Problem: Education: Goal: Knowledge of General Education information will improve Description: Including pain rating scale, medication(s)/side effects and non-pharmacologic comfort measures Outcome: Progressing   Problem: Health Behavior/Discharge Planning: Goal: Ability to manage health-related needs will improve Outcome: Progressing   Problem: Clinical Measurements: Goal: Ability to maintain clinical measurements within normal limits will improve Outcome: Progressing Goal: Will remain free from infection Outcome: Progressing Goal: Diagnostic test results will improve Outcome: Progressing Goal: Respiratory complications will improve Outcome: Progressing Goal: Cardiovascular complication will be avoided Outcome: Progressing   Problem: Activity: Goal: Risk for activity intolerance will decrease Outcome: Progressing   Problem: Nutrition: Goal: Adequate nutrition will be maintained Outcome: Progressing   Problem: Pain Managment: Goal: General experience of comfort will improve and/or be controlled Outcome: Progressing   Problem: Safety: Goal: Ability to remain free from injury will improve Outcome: Progressing   Problem: Skin Integrity: Goal: Risk for impaired skin integrity will decrease Outcome: Progressing   Problem: Coping: Goal: Level of anxiety will decrease Outcome: Adequate for Discharge   Problem: Elimination: Goal: Will not experience complications related to bowel motility Outcome: Adequate for Discharge Goal: Will not experience complications related to urinary retention Outcome: Adequate for Discharge

## 2024-04-09 NOTE — Assessment & Plan Note (Signed)
 Most recent Echo 08/09/2022 EF 55-60%, grade I diastolic dysfunction, and normal RV BNP in ED 3741 Repeat Echo unremarkable with small pericardial effusion Restart lasix  at time of dc Continue home statin  Repeat chest x-ray showed pulmonary congestion no pneumothorax Repeat EKG did not show anything acute No documented hypoxia appreciated but she was started on oxygen  via high flow nasal cannula and will wean as tolerated Hold Farxiga  Has chronic LE edema, likely unrelated to current presentation; will add TED hose

## 2024-04-09 NOTE — Assessment & Plan Note (Signed)
 N/V/D prior to presentation UA in ED with large leukocytes, negative nitries, 21-50 WBC, and few bacteria Denies dysuria or pelvic pain Also with concern for multifocal PNA on chest CT Urine culture with >100k colonies E coli with resistance to Ampicillin , Unasyn , and Cipro  Unasyn  -> Ceftriaxone  through 11/25

## 2024-04-09 NOTE — Assessment & Plan Note (Addendum)
 Mildly worsening while here Likely associated with anemia of chronic disease and repeated blood draws No reported acute blood loss Now improved

## 2024-04-09 NOTE — Assessment & Plan Note (Signed)
 Reports intermittent diarrhea since at least July 1 loose BM yesterday and 2 today Low suspicion for infectious cause Stop Colace (also has prn Miralax ) Will add fiber and prn Imodium 

## 2024-04-09 NOTE — Assessment & Plan Note (Addendum)
 Body mass index is 50.36 kg/m.[name]  Weight loss should be encouraged Outpatient PCP/bariatric medicine f/u encouraged Significantly low or high BMI is associated with higher medical risk including morbidity and mortality

## 2024-04-09 NOTE — Assessment & Plan Note (Signed)
 Rate controlled with multaq  Continue Eliquis 

## 2024-04-09 NOTE — Assessment & Plan Note (Signed)
 Quit smoking 4 years ago Wean O2 No known h/o COPD, although ambulatory pulse ox could be requested if there is concern for need for home O2

## 2024-04-09 NOTE — Assessment & Plan Note (Signed)
 Severe presenting hypokalemia from GI losses, which led to cardiac arrest GI losses have improved, no longer having n/v/d K+ is stable but marginally low She has not tolerated KCl pills or packets  She understands that she must be willing and able to tolerate PO KCl in order to be successful at home and so to be discharged She is now able to tolerate KCl packets and this issue is stable

## 2024-04-09 NOTE — [place] FL2 (Signed)
 [place]  MEDICAID FL2 LEVEL OF CARE FORM     IDENTIFICATION  Patient Name: Kristine Montgomery Birthdate: 02-Jan-1966 Sex: female Admission Date (Current Location): 04/04/2024  [facility] and [place] Number:  Producer, Television/film/video and Address:  [facility],  [address] [place], [place] [address]      Provider Number: [id number]  Attending Physician Name and Address:  Barbarann Nest, MD  Relative Name and Phone Number:       Current Level of Care: Hospital Recommended Level of Care: Skilled Nursing Facility Prior Approval Number:    Date Approved/Denied:   PASRR Number: [id number]  Discharge Plan: SNF    Current Diagnoses: Patient Active Problem List   Diagnosis Date Noted   Chronic diarrhea 04/09/2024   Normocytic anemia 04/08/2024   Positive blood culture 04/08/2024   Adjustment disorder with mixed anxiety and depressed mood 04/08/2024   Multiple rib fractures 04/07/2024   Pressure injury of skin 04/06/2024   Acute hypoxic respiratory failure (HCC) 04/06/2024   Cardiac arrest (HCC) 04/04/2024   Cardiac arrest with ventricular fibrillation (HCC) 04/04/2024   Spinal stenosis in cervical region 12/12/2023   Weakness 11/10/2023   Hypotension 11/05/2023   Acute on chronic combined systolic and diastolic CHF (congestive heart failure) (HCC) 11/05/2023   Cellulitis 11/05/2023   Lower extremity edema 11/02/2023   Hypertensive heart disease with heart failure (HCC) 09/03/2023   Chronic diastolic (congestive) heart failure (HCC) 09/03/2023   Bilateral hearing loss 05/26/2023   Chronic pain syndrome 05/18/2023   Seropositive rheumatoid arthritis (HCC) 04/21/2022   High risk medication use 04/21/2022   Idiopathic gout of multiple sites 04/21/2022   Secondary hypercoagulable state 10/14/2021   Paroxysmal atrial fibrillation (HCC) 03/23/2021   Hypokalemia 03/23/2021   Leukocytosis 03/23/2021   Thrombocytosis 03/23/2021   Elevated troponin 03/23/2021   Hyperglycemia  03/23/2021   Ischemic cardiomyopathy 07/31/2020   Coronary artery disease involving native coronary artery of native heart with unstable angina pectoris (HCC) 07/31/2020   Nocturnal hypoxemia due to obesity 07/31/2020   Morbid obesity (HCC) 07/31/2020   Tobacco abuse 07/31/2020   Acute combined systolic and diastolic CHF, NYHA class 4 (HCC) 07/29/2020   Mixed hyperlipidemia 07/29/2020   NSTEMI (non-ST elevated myocardial infarction) (HCC) 07/29/2020   Unstable angina (HCC) 07/28/2020   S/P right knee arthroscopy 05/23/20 05/28/2020   Tear of meniscus of knee joint    Synovitis of knee    Primary osteoarthritis of right knee    Encounter for screening fecal occult blood testing 04/09/2020   Encounter for gynecological examination with Papanicolaou smear of cervix 04/09/2020   Urinary tract infection without hematuria 02/27/2020   Dysuria 02/27/2020   Urge incontinence 04/10/2018   OAB (overactive bladder) 04/10/2018   Urinary frequency 04/10/2018   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 07/15/2015   Left ear impacted cerumen 07/15/2015    Orientation RESPIRATION BLADDER Height & Weight     Self, Time, Situation, Place  O2 (2L) Continent Weight: 266 lb 8.6 oz (120.9 kg) Height:  5' 1 (154.9 cm)  BEHAVIORAL SYMPTOMS/MOOD NEUROLOGICAL BOWEL NUTRITION STATUS      Continent Diet (Heart Healthy)  AMBULATORY STATUS COMMUNICATION OF NEEDS Skin   Total Care Verbally PU Stage and Appropriate Care (see d/c summary)                       Personal Care Assistance Level of Assistance  Bathing, Feeding, Dressing Bathing Assistance: Maximum assistance Feeding assistance: Limited assistance Dressing  Assistance: Maximum assistance     Functional Limitations Info  Sight, Speech, Hearing Sight Info: Adequate Hearing Info: Adequate Speech Info: Adequate    SPECIAL CARE FACTORS FREQUENCY  PT (By licensed PT), OT (By licensed OT)     PT Frequency: 5 x a week OT Frequency: 5 x a  week            Contractures Contractures Info: Not present    Additional Factors Info  Code Status, Allergies Code Status Info: full Allergies Info: no known allergies           Current Medications (04/09/2024):  This is the current hospital active medication list Current Facility-Administered Medications  Medication Dose Route Frequency Provider Last Rate Last Admin   acetaminophen  (TYLENOL ) tablet 650 mg  650 mg Oral Q4H PRN Ogan, Okoronkwo U, MD       apixaban  (ELIQUIS ) tablet 5 mg  5 mg Oral BID Mannam, Praveen, MD   5 mg at 04/09/24 0943   atorvastatin  (LIPITOR ) tablet 80 mg  80 mg Oral Daily Adams, Zane, PA-C   80 mg at 04/09/24 9056   busPIRone  (BUSPAR ) tablet 5 mg  5 mg Oral TID Barbarann Nest, MD   5 mg at 04/09/24 9056   camphor-menthol  (SARNA) lotion   Topical PRN Haze Led, MD   Given at 04/05/24 2209   cefTRIAXone  (ROCEPHIN ) 2 g in sodium chloride  0.9 % 100 mL IVPB  2 g Intravenous Q24H Barbarann Nest, MD 200 mL/hr at 04/09/24 0940 2 g at 04/09/24 0940   Chlorhexidine  Gluconate Cloth 2 % PADS 6 each  6 each Topical Daily Olalere, Adewale A, MD   6 each at 04/08/24 1000   dronedarone  (MULTAQ ) tablet 400 mg  400 mg Oral BID WC Mannam, Praveen, MD   400 mg at 04/09/24 9057   escitalopram  (LEXAPRO ) tablet 5 mg  5 mg Oral Daily Barbarann Nest, MD   5 mg at 04/09/24 9056   HYDROcodone -acetaminophen  (NORCO) 10-325 MG per tablet 1 tablet  1 tablet Oral Q6H PRN Barbarann Nest, MD   1 tablet at 04/09/24 9057   lidocaine  (LIDODERM ) 5 % 2 patch  2 patch Transdermal Q24H Haze Led, MD   2 patch at 04/08/24 2201   loperamide  (IMODIUM ) capsule 2 mg  2 mg Oral PRN Barbarann Nest, MD       methocarbamol  (ROBAXIN ) tablet 500 mg  500 mg Oral Q8H PRN Chavez, Abigail, NP   500 mg at 04/08/24 2155   Oral care mouth rinse  15 mL Mouth Rinse PRN Ogan, Okoronkwo U, MD       polycarbophil (FIBERCON) tablet 625 mg  625 mg Oral Daily Barbarann Nest, MD       polyethylene  glycol (MIRALAX  / GLYCOLAX ) packet 17 g  17 g Oral Daily PRN Ogan, Okoronkwo U, MD       potassium chloride  (KLOR-CON ) packet 20 mEq  20 mEq Oral Daily Barbarann Nest, MD   20 mEq at 04/09/24 9057   sodium chloride  flush (NS) 0.9 % injection 3 mL  3 mL Intravenous Q12H Ogan, Okoronkwo U, MD   3 mL at 04/09/24 1000   sodium chloride  flush (NS) 0.9 % injection 3 mL  3 mL Intravenous PRN Ogan, Okoronkwo U, MD         Discharge Medications: Please see discharge summary for a list of discharge medications.  Relevant Imaging Results:  Relevant Lab Results:   Additional Information SSN [id number]  [name] HERO Gautham Hewins, [name]

## 2024-04-10 DIAGNOSIS — I4901 Ventricular fibrillation: Secondary | ICD-10-CM | POA: Diagnosis not present

## 2024-04-10 DIAGNOSIS — I469 Cardiac arrest, cause unspecified: Secondary | ICD-10-CM | POA: Diagnosis not present

## 2024-04-10 MED ORDER — ONDANSETRON HCL 4 MG/2ML IJ SOLN
4.0000 mg | Freq: Four times a day (QID) | INTRAMUSCULAR | Status: DC | PRN
  Administered 2024-04-10 – 2024-04-16 (×2): 4 mg via INTRAVENOUS
  Filled 2024-04-10 (×2): qty 2

## 2024-04-10 MED ORDER — GABAPENTIN 100 MG PO CAPS
200.0000 mg | ORAL_CAPSULE | Freq: Three times a day (TID) | ORAL | Status: DC
  Administered 2024-04-10 – 2024-04-18 (×25): 200 mg via ORAL
  Filled 2024-04-10 (×25): qty 2

## 2024-04-10 MED ORDER — DAPAGLIFLOZIN PROPANEDIOL 10 MG PO TABS
10.0000 mg | ORAL_TABLET | Freq: Every day | ORAL | Status: DC
  Administered 2024-04-10 – 2024-04-18 (×9): 10 mg via ORAL
  Filled 2024-04-10 (×9): qty 1

## 2024-04-10 NOTE — Assessment & Plan Note (Signed)
 N/V/D prior to presentation UA in ED with large leukocytes, negative nitries, 21-50 WBC, and few bacteria Denies dysuria or pelvic pain Also with concern for multifocal PNA on chest CT Urine culture with >100k colonies E coli with resistance to Ampicillin , Unasyn , and Cipro  Unasyn  -> Ceftriaxone  through 11/25

## 2024-04-10 NOTE — Assessment & Plan Note (Deleted)
 Body mass index is 50.36 kg/m.[name]  Weight loss should be encouraged Outpatient PCP/bariatric medicine f/u encouraged Significantly low or high BMI is associated with higher medical risk including morbidity and mortality

## 2024-04-10 NOTE — Assessment & Plan Note (Signed)
 Likely related to CPR CTA with L anterior fractures of ribs 3-6, age indeterminate - not appreciated on xrays Continue lidocaine  patches and prn Norco for pain control PT/OT recommending SNF Patient is now willing to consider SNF (previously declined), prefers [facility] spirometry ordered Splinting from rib pain, needs to wean off O2 when able

## 2024-04-10 NOTE — Assessment & Plan Note (Deleted)
 Continue atorvastatin 

## 2024-04-10 NOTE — Assessment & Plan Note (Deleted)
 Likely related to CPR CTA with L anterior fractures of ribs 3-6, age indeterminate - not appreciated on xrays Continue lidocaine  patches and prn Norco for pain control PT/OT recommending SNF Patient is now willing to consider SNF (previously declined), prefers [facility] spirometry ordered Splinting from rib pain, needs to wean off O2 when able

## 2024-04-10 NOTE — Assessment & Plan Note (Signed)
 Rate controlled with multaq  Continue Eliquis 

## 2024-04-10 NOTE — Assessment & Plan Note (Addendum)
 Wound 04/04/24 2300 Pressure Injury Buttocks Bilateral Stage 2 -  Partial thickness loss of dermis presenting as a shallow open injury with a red, pink wound bed without slough. (Active)     Wound 04/04/24 2300 Pressure Injury Buttocks Bilateral Stage 1 -  Intact skin with non-blanchable redness of a localized area usually over a bony prominence. (Active)

## 2024-04-10 NOTE — Assessment & Plan Note (Deleted)
 Mildly worsening while here Likely associated with anemia of chronic disease and repeated blood draws No reported acute blood loss Now improved

## 2024-04-10 NOTE — Assessment & Plan Note (Deleted)
 Rate controlled with multaq  Continue Eliquis 

## 2024-04-10 NOTE — Assessment & Plan Note (Deleted)
 N/V/D prior to presentation UA in ED with large leukocytes, negative nitries, 21-50 WBC, and few bacteria Denies dysuria or pelvic pain Also with concern for multifocal PNA on chest CT Urine culture with >100k colonies E coli with resistance to Ampicillin , Unasyn , and Cipro  Unasyn  -> Ceftriaxone  through 11/25

## 2024-04-10 NOTE — Plan of Care (Signed)

## 2024-04-10 NOTE — Assessment & Plan Note (Signed)
 Quit smoking 4 years ago Wean O2 No known h/o COPD, although ambulatory pulse ox could be requested if there is concern for need for home O2

## 2024-04-10 NOTE — Assessment & Plan Note (Addendum)
 Most recent Echo 08/09/2022 EF 55-60%, grade I diastolic dysfunction, and normal RV BNP in ED 3741 Repeat Echo unremarkable with small pericardial effusion Restart lasix  at time of dc Continue home statin  Repeat chest x-ray showed pulmonary congestion no pneumothorax Repeat EKG did not show anything acute No documented hypoxia appreciated but she was started on oxygen  via high flow nasal cannula and will wean as tolerated Resume Farxiga  Has chronic LE edema, likely unrelated to current presentation; will add TED hose

## 2024-04-10 NOTE — Assessment & Plan Note (Signed)
 Mildly worsening while here Likely associated with anemia of chronic disease and repeated blood draws No reported acute blood loss Now improved

## 2024-04-10 NOTE — Assessment & Plan Note (Deleted)
 I have reviewed this patient in the [place] Controlled Substances Reporting System.  She is receiving medications from two providers but appears to be taking them as prescribed. She is at high risk of opioid misuse, diversion, or overdose.  Continue gabapentin , Norco

## 2024-04-10 NOTE — Progress Notes (Signed)
 PT Cancellation Note  Patient Details Name: Kristine Montgomery MRN: [id number] DOB: May 21, 1965   Cancelled Treatment:    Reason Eval/Treat Not Completed: Fatigue/lethargy limiting ability to participate (pt up in recliner, she reported she's fatigued from working with OT and wants to eat her dinner now. She declined PT at present but would like to try in the morning. Will follow.)   Sylvan Delon Copp PT 04/10/2024  Acute Rehabilitation Services  Office [phone]

## 2024-04-10 NOTE — Progress Notes (Signed)
 Occupational Therapy Treatment Patient Details Name: Kristine Montgomery MRN: [id number] DOB: [birth year] Today's Date: 04/10/2024   History of present illness 58 year old female who presents as transfer from [facility] ED post Vfib arrest in the setting of hypokalemia. Pt received CPR.  L anterior 3-6 rib fractures.  She initially presented to the Ed with 4 days of nausea, vomiting and diarrhea. 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 Son bedside for participation and carryover with light UE/LE and IS therex between visits. Patient tolerated OOB to recliner and light ADL's open and motivated with all activity presented. SpO2 remained 92% on 2 ltrs O2 via [place]. Patient will benefit from continued inpatient follow up therapy, <3 hours/day with good potential to progress. Patient requires continued Acute care hospital level OT services to progress safety and functional performance and allow for discharge.        If plan is discharge home, recommend the following:  Two people to help with walking and/or transfers;Two people to help with bathing/dressing/bathroom;Assistance with cooking/housework;Assistance with feeding;Assist for transportation;Help with stairs or ramp for entrance;Direct supervision/assist for medications management;Direct supervision/assist for financial management   Equipment Recommendations  Hospital bed (if home)       Precautions / Restrictions Precautions Precautions: Fall;Other (comment) (vital s) Precaution/Restrictions Comments: L 3-6 rib fractures 2* CPR (sternal awareness) monitor O2 Restrictions Weight Bearing Restrictions Per Provider Order: No       Mobility Bed Mobility Overal bed mobility: Needs Assistance Bed Mobility: Rolling Rolling: +2 for physical  assistance, Max assist, Used rails         General bed mobility comments: used rails for B rolling to place pad for lift    Transfers Overall transfer level: Needs assistance Equipment used: Ambulation equipment used Transfers: Bed to chair/wheelchair/BSC             General transfer comment: tolerated maxi move from bed to recliner Transfer via Lift Equipment: Maximove   Balance Overall balance assessment: Needs assistance Sitting-balance support: Feet supported Sitting balance-Leahy Scale: Fair                                     ADL either performed or assessed with clinical judgement   ADL Overall ADL's : Needs assistance/impaired Eating/Feeding: Set up;Bed level   Grooming: Wash/dry hands;Wash/dry face;Oral care;Sitting;Contact guard assist Grooming Details (indicate cue type and reason): recliner level Upper Body Bathing: Moderate assistance;Sitting   Lower Body Bathing: Total assistance;Bed level   Upper Body Dressing : Moderate assistance;Sitting   Lower Body Dressing: Total assistance;+2 for physical assistance;+2 for safety/equipment;Bed level               Functional mobility during ADLs: +2 for physical assistance;+2 for safety/equipment;Total assistance (tolerated maximove OOB) General ADL Comments: improved overall tolerance for OOB light ADL's this day    Extremity/Trunk Assessment Upper Extremity Assessment Upper Extremity Assessment: Generalized weakness;Right hand dominant RUE Deficits / Details: tested at shoulder level due to rib fx/pain from cpr RUE Coordination: decreased fine motor;decreased gross motor LUE Deficits / Details: tested ROM only to shoulder level due to rib fx from cpr LUE Coordination: decreased fine motor;decreased gross motor   Lower Extremity Assessment Lower  Extremity Assessment: Defer to PT evaluation                 Communication Communication Communication: No apparent difficulties    Cognition Arousal: Alert Behavior During Therapy: WFL for tasks assessed/performed Cognition: No apparent impairments                               Following commands: Intact        Cueing   Cueing Techniques: Verbal cues  Exercises Exercises: Other exercises (breathing with IS 10 reps recliner level) General Exercises - Lower Extremity Ankle Circles/Pumps: AROM, Both, 10 reps, Seated Long Arc Quad: AROM, Both, 10 reps, Seated       General Comments improved LE edema to +2, SpO2 remained 92% on 2 ltrs O2 via [place]    Pertinent Vitals/ Pain       Pain Assessment Pain Assessment: 0-10 Pain Score: 6  Pain Location: chest Pain Descriptors / Indicators: Sore, Guarding, Grimacing Pain Intervention(s): Monitored during session, Premedicated before session, Repositioned, Relaxation   Frequency  Min 2X/week        Progress Toward Goals  OT Goals(current goals can now be found in the care plan section)  Progress towards OT goals: Progressing toward goals  Acute Rehab OT Goals Patient Stated Goal: to keep getting up OT Goal Formulation: With patient/family Time For Goal Achievement: 04/20/24 Potential to Achieve Goals: Fair ADL Goals Pt Will Perform Grooming: with set-up;sitting Pt Will Perform Upper Body Bathing: with min assist;sitting Pt Will Perform Upper Body Dressing: with min assist;sitting Pt/caregiver will Perform Home Exercise Program: With theraputty;With written HEP provided;With Supervision;Increased strength;Both right and left upper extremity Additional ADL Goal #1: patient will teach back 4/5 ECT principles and sternal precautions (informal d/t rib fxs from chest compression) with min cues  Plan         AM-PAC OT 6 Clicks Daily Activity     Outcome Measure   Help from another person eating meals?: A Little Help from another person taking care of personal grooming?: A Little Help from another person toileting, which includes using  toliet, bedpan, or urinal?: Total Help from another person bathing (including washing, rinsing, drying)?: A Lot Help from another person to put on and taking off regular upper body clothing?: A Lot Help from another person to put on and taking off regular lower body clothing?: Total 6 Click Score: 12    End of Session Equipment Utilized During Treatment: Oxygen ;Other (comment) (maximove)  OT Visit Diagnosis: Other abnormalities of gait and mobility (R26.89);Muscle weakness (generalized) (M62.81);Pain Pain - part of body:  (chest and ribs)   Activity Tolerance Patient tolerated treatment well   Patient Left with call bell/phone within reach;in chair;with chair alarm set;with family/visitor present   Nurse Communication Mobility status        Time: 8879-8844 OT Time Calculation (min): 35 min  Charges: OT General Charges $OT Visit: 1 Visit OT Treatments $Self Care/Home Management : 8-22 mins $Therapeutic Activity: 8-22 mins  Clementine Soulliere OT/L Acute Rehabilitation Department  [phone]  04/10/2024, 4:05 PM

## 2024-04-10 NOTE — Assessment & Plan Note (Addendum)
 Severe presenting hypokalemia from GI losses, which led to cardiac arrest GI losses have improved, no longer having n/v/d K+ is stable but marginally low She has not tolerated KCl pills or packets  She is now able to tolerate KCl packets and this issue is stable

## 2024-04-10 NOTE — Progress Notes (Signed)
 Progress Note   Patient: Kristine Montgomery FMW:[id number] DOB: [birth year] DOA: 04/04/2024     6 DOS: the patient was seen and examined on 04/10/2024   Brief hospital course: 58yo with h/o CAD s/p DES, HFrEF (improved from 30% to 55-60%), PAF, HLD, asthma, RA, and morbid obesity who presented on 11/19 with Vtach to decompensated vfib arrest requiring CPR and defibrillation x 1 minute with ROSC.  This was attributed to severe electrolyte disturbance with K+ 2.1 and Ca++ 7.1.  Electrolytes were repleted and she was admitted to [facility] ICU (from [facility]) for ongoing stabilization.  Cardiology consulted, given some diuresis.    Assessment & Plan Cardiac arrest with ventricular fibrillation (HCC) In the setting of hypokalemia, hypocalcemia, and home Multaq  for Afib Troponins in ED 27>22 Cardiology consulted and has resumed Multaq ; they are now signed off Aggressive electrolyte repletion, now WNL Continue home Eliquis  Multiple rib fractures Likely related to CPR CTA with L anterior fractures of ribs 3-6, age indeterminate - not appreciated on xrays Continue lidocaine  patches and prn Norco for pain control PT/OT recommending SNF Patient is now willing to consider SNF (previously declined), prefers [facility] spirometry ordered Splinting from rib pain, needs to wean off O2 when able Hypokalemia Severe presenting hypokalemia from GI losses, which led to cardiac arrest GI losses have improved, no longer having n/v/d K+ is stable but marginally low She has not tolerated KCl pills or packets  She is now able to tolerate KCl packets and this issue is stable Acute on chronic combined systolic and diastolic CHF (congestive heart failure) (HCC) Coronary artery disease involving native coronary artery of native heart with unstable angina pectoris (HCC) Most recent Echo 08/09/2022 EF 55-60%, grade I diastolic dysfunction, and normal RV BNP in ED 3741 Repeat Echo unremarkable with small pericardial  effusion Restart lasix  at time of dc Continue home statin  Repeat chest x-ray showed pulmonary congestion no pneumothorax Repeat EKG did not show anything acute No documented hypoxia appreciated but she was started on oxygen  via high flow nasal cannula and will wean as tolerated Resume Farxiga  Has chronic LE edema, likely unrelated to current presentation; will add TED hose Adjustment disorder with mixed anxiety and depressed mood Reports h/o mood disturbance at home Amplified by anxiety about cardiac arrest She is willing to try medication Added Lexapro  + buspirone  (standing for now, can change to prn once Lexapro  takes effect); she is noticing some improvement and feels less tense Pressure injury of skin Wound 04/04/24 2300 Pressure Injury Buttocks Bilateral Stage 2 -  Partial thickness loss of dermis presenting as a shallow open injury with a red, pink wound bed without slough. (Active)     Wound 04/04/24 2300 Pressure Injury Buttocks Bilateral Stage 1 -  Intact skin with non-blanchable redness of a localized area usually over a bony prominence. (Active)   Tobacco abuse Quit smoking 4 years ago Wean O2 No known h/o COPD, although ambulatory pulse ox could be requested if there is concern for need for home O2 Paroxysmal atrial fibrillation (HCC) Rate controlled with multaq  Continue Eliquis  Mixed hyperlipidemia Continue atorvastatin  Chronic pain syndrome I have reviewed this patient in the [place] Controlled Substances Reporting System.  She is receiving medications from two providers but appears to be taking them as prescribed. She is at high risk of opioid misuse, diversion, or overdose.  Continue gabapentin , Norco Urinary tract infection without hematuria N/V/D prior to presentation UA in ED with large leukocytes, negative nitries, 21-50 WBC, and few bacteria  Denies dysuria or pelvic pain Also with concern for multifocal PNA on chest CT Urine culture with >100k colonies E coli  with resistance to Ampicillin , Unasyn , and Cipro  Unasyn  -> Ceftriaxone  through 11/25 Normocytic anemia Mildly worsening while here Likely associated with anemia of chronic disease and repeated blood draws No reported acute blood loss Now improved Chronic diarrhea Reports intermittent diarrhea since at least July 1 loose BM yesterday and 2 today Low suspicion for infectious cause Stop Colace (also has prn Miralax ) Will add fiber and prn Imodium  Morbid obesity (HCC) Body mass index is 50.36 kg/m.[name]  Weight loss should be encouraged Outpatient PCP/bariatric medicine f/u encouraged Significantly low or high BMI is associated with higher medical risk including morbidity and mortality        *Medically stable and awaiting placement in SNF.         Consultants: PCCM Cardiology   Procedures: Echocardiogram 11/20   Antibiotics: Unasyn  11/19-22 Ciprofloxacin  x 1 Ceftriaxone  11/22-25  30 Day Unplanned Readmission Risk Score    Flowsheet Row ED to Hosp-Admission (Current) from 04/04/2024 in [place] 4TH FLOOR PROGRESSIVE CARE AND UROLOGY  30 Day Unplanned Readmission Risk Score (%) 21.39 Filed at 04/10/2024 1200    This score is the patient's risk of an unplanned readmission within 30 days of being discharged (0 -100%). The score is based on dignosis, age, lab data, medications, orders, and past utilization.   Low:  0-14.9   Medium: 15-21.9   High: 22-29.9   Extreme: 30 and above           Subjective: Still complaining of chest wall pain but this is unchanged from prior.  LE edema is improved.   Objective: Vitals:   04/10/24 0432 04/10/24 1337  BP: 134/88 (!) 140/83  Pulse: 85 78  Resp: 16 18  Temp: 97.7 F (36.5 C) 97.8 F (36.6 C)  SpO2: 92% 92%    Intake/Output Summary (Last 24 hours) at 04/10/2024 1543 Last data filed at 04/10/2024 0300 Gross per 24 hour  Intake --  Output 300 ml  Net -300 ml   Filed Weights   04/06/24 0330 04/07/24 0335  04/08/24 0500  Weight: 116.3 kg 117.2 kg 120.9 kg    Exam:  General:  Appears calm and comfortable and is in NAD Eyes:  normal lids, iris ENT:  grossly normal hearing, lips & tongue, mmm Cardiovascular:  RRR. 1+ LE edema.  Lidocaine  patches in place along chest wall Respiratory:   CTA bilaterally with no wheezes/rales/rhonchi.  Normal respiratory effort. Abdomen:  soft, NT, ND Skin:  no rash or induration seen on limited exam Musculoskeletal:  grossly normal tone BUE/BLE, good ROM, no bony abnormality Psychiatric:  blunted mood and affect, speech fluent and appropriate, AOx3 Neurologic:  CN 2-12 grossly intact, moves all extremities in coordinated fashion  Data Reviewed: I have reviewed the patient's lab results since admission.  Pertinent labs for today include:   None today     Family Communication: Son was present  Mobility: PT/OT Consulted and are recommending - Skilled Nursing-Short Term Rehab (<3 Hours/Day) (If Family And Aides Cannot Provide Needed Level Of Care)04/06/2024 1148    Code Status: Full Code   Disposition: Status is: Inpatient Remains inpatient appropriate because: awaiting placement     Time spent: 35 minutes  Unresulted Labs (From admission, onward)     Start     Ordered   04/11/24 0500  CBC  Tomorrow morning,   R       Question:  Specimen collection method  Answer:  Lab=Lab collect   04/10/24 1539   04/11/24 0500  Basic metabolic panel with GFR  Tomorrow morning,   R       Question:  Specimen collection method  Answer:  Lab=Lab collect   04/10/24 1539             Author: Delon Herald, MD 04/10/2024 3:43 PM  For on call review [email or web address].

## 2024-04-10 NOTE — TOC Progression Note (Addendum)
 Transition of Care [facility]) - Progression Note    Patient Details  Name: Kristine Montgomery MRN: [id number] Date of Birth: 01-26-1966  Transition of [facility]) CM/SW Contact  [name] [name], [name] Phone Number: 04/10/2024, 10:39 AM  Clinical Narrative:     CSW spoke with pt to present bed offers, pt is requesting time to discuss with her family. Care management to follow.   Adden  2:00pm  CSW spoke with the patient at bedside to inquire about bed choice. The patient reported that she has still not spoken with her daughter. CSW informed the patient that insurance authorization will need to be started today. The patient has chosen [facility]. CSW spoke with Isaiah and informed her that authorization will be started today.Insurance authorization for SNF placement is pending. ICM to follow.    3:40pm CSW received call from [facility] requesting updated PT/OT notes. Will upload clinicals when available. ICM to follow.   Expected Discharge Plan: Skilled Nursing Facility Barriers to Discharge: Continued Medical Work up, English As [employer] Language [employer], SNF Pending bed offer               Expected Discharge Plan and Services In-house Referral: NA Discharge Planning Services: CM Consult Post Acute Care Choice: Durable Medical Equipment, Home Health Living arrangements for the past 2 months: Apartment                 DME Arranged: N/A DME Agency: NA       HH Arranged: NA HH Agency: NA         Social Drivers of Health (SDOH) Interventions SDOH Screenings   Food Insecurity: No Food Insecurity (04/04/2024)  Housing: Low Risk  (04/04/2024)  Transportation Needs: No Transportation Needs (04/04/2024)  Utilities: Not At Risk (04/04/2024)  Alcohol Screen: Low Risk  (04/09/2020)  Depression (PHQ2-9): Low Risk  (04/09/2020)  Financial Resource Strain: Low Risk  (07/30/2020)  Physical Activity: Insufficiently Active (04/09/2020)  Social Connections: Socially Isolated (07/30/2020)   Stress: No Stress Concern Present (04/09/2020)  Tobacco Use: Medium Risk (04/04/2024)    Readmission Risk Interventions    04/06/2024    3:16 PM 11/09/2023   11:50 AM 11/06/2023    6:27 PM  Readmission Risk Prevention Plan  Transportation Screening Complete Complete Complete  PCP or Specialist Appt within 5-7 Days Complete  Complete  Home Care Screening Complete Complete Complete  Medication Review (RN CM) Complete Complete Complete

## 2024-04-10 NOTE — Assessment & Plan Note (Signed)
 In the setting of hypokalemia, hypocalcemia, and home Multaq  for Afib Troponins in ED 27>22 Cardiology consulted and has resumed Multaq ; they are now signed off Aggressive electrolyte repletion, now WNL Continue home Eliquis 

## 2024-04-10 NOTE — Assessment & Plan Note (Deleted)
 Quit smoking 4 years ago Wean O2 No known h/o COPD, although ambulatory pulse ox could be requested if there is concern for need for home O2

## 2024-04-10 NOTE — Assessment & Plan Note (Signed)
 Reports intermittent diarrhea since at least July 1 loose BM yesterday and 2 today Low suspicion for infectious cause Stop Colace (also has prn Miralax ) Will add fiber and prn Imodium 

## 2024-04-10 NOTE — Assessment & Plan Note (Deleted)
 Most recent Echo 08/09/2022 EF 55-60%, grade I diastolic dysfunction, and normal RV BNP in ED 3741 Repeat Echo unremarkable with small pericardial effusion Restart lasix  at time of dc Continue home statin  Repeat chest x-ray showed pulmonary congestion no pneumothorax Repeat EKG did not show anything acute No documented hypoxia appreciated but she was started on oxygen  via high flow nasal cannula and will wean as tolerated Hold Farxiga  Has chronic LE edema, likely unrelated to current presentation; will add TED hose

## 2024-04-10 NOTE — Assessment & Plan Note (Signed)
 Continue atorvastatin 

## 2024-04-10 NOTE — Assessment & Plan Note (Signed)
 I have reviewed this patient in the [place] Controlled Substances Reporting System.  She is receiving medications from two providers but appears to be taking them as prescribed. She is at high risk of opioid misuse, diversion, or overdose.  Continue gabapentin , Norco

## 2024-04-10 NOTE — Assessment & Plan Note (Addendum)
 Body mass index is 50.36 kg/m.[name]  Weight loss should be encouraged Outpatient PCP/bariatric medicine f/u encouraged Significantly low or high BMI is associated with higher medical risk including morbidity and mortality

## 2024-04-10 NOTE — Plan of Care (Incomplete)
  Problem: Education: Goal: Knowledge of General Education information will improve Description: Including pain rating scale, medication(s)/side effects and non-pharmacologic comfort measures Outcome: Progressing   Problem: Health Behavior/Discharge Planning: Goal: Ability to manage health-related needs will improve Outcome: Progressing   Problem: Clinical Measurements: Goal: Ability to maintain clinical measurements within normal limits will improve Outcome: Progressing Goal: Will remain free from infection Outcome: Progressing Goal: Diagnostic test results will improve Outcome: Progressing Goal: Respiratory complications will improve Outcome: Progressing   Problem: Activity: Goal: Risk for activity intolerance will decrease Outcome: Progressing   Problem: Nutrition: Goal: Adequate nutrition will be maintained Outcome: Progressing   Problem: Coping: Goal: Level of anxiety will decrease Outcome: Progressing   Problem: Elimination: Goal: Will not experience complications related to bowel motility Outcome: Progressing   Problem: Pain Managment: Goal: General experience of comfort will improve and/or be controlled Outcome: Progressing   Problem: Safety: Goal: Ability to remain free from injury will improve Outcome: Progressing   Problem: Clinical Measurements: Goal: Cardiovascular complication will be avoided Outcome: Adequate for Discharge

## 2024-04-10 NOTE — Assessment & Plan Note (Deleted)
 Severe presenting hypokalemia from GI losses, which led to cardiac arrest GI losses have improved, no longer having n/v/d K+ is stable but marginally low She has not tolerated KCl pills or packets  She understands that she must be willing and able to tolerate PO KCl in order to be successful at home and so to be discharged She is now able to tolerate KCl packets and this issue is stable

## 2024-04-10 NOTE — Assessment & Plan Note (Deleted)
 Reports h/o mood disturbance at home Amplified by anxiety about cardiac arrest She is willing to try medication Will add Lexapro  + buspirone  (standing for now, can change to prn once Lexapro  takes effect)

## 2024-04-10 NOTE — Assessment & Plan Note (Deleted)
 Reports intermittent diarrhea since at least July 1 loose BM yesterday and 2 today Low suspicion for infectious cause Stop Colace (also has prn Miralax ) Will add fiber and prn Imodium 

## 2024-04-10 NOTE — Assessment & Plan Note (Addendum)
 Reports h/o mood disturbance at home Amplified by anxiety about cardiac arrest She is willing to try medication Added Lexapro  + buspirone  (standing for now, can change to prn once Lexapro  takes effect); she is noticing some improvement and feels less tense

## 2024-04-10 NOTE — Assessment & Plan Note (Deleted)
 Wound 04/04/24 2300 Pressure Injury Buttocks Bilateral Stage 2 -  Partial thickness loss of dermis presenting as a shallow open injury with a red, pink wound bed without slough. (Active)     Wound 04/04/24 2300 Pressure Injury Buttocks Bilateral Stage 1 -  Intact skin with non-blanchable redness of a localized area usually over a bony prominence. (Active)

## 2024-04-10 NOTE — Assessment & Plan Note (Deleted)
 In the setting of hypokalemia, hypocalcemia, and home Multaq  for Afib Troponins in ED 27>22 Cardiology consulted and has resumed Multaq ; they are now signed off Aggressive electrolyte repletion, now WNL Continue home Eliquis 

## 2024-04-11 DIAGNOSIS — E782 Mixed hyperlipidemia: Secondary | ICD-10-CM

## 2024-04-11 DIAGNOSIS — D649 Anemia, unspecified: Secondary | ICD-10-CM

## 2024-04-11 DIAGNOSIS — I469 Cardiac arrest, cause unspecified: Secondary | ICD-10-CM | POA: Diagnosis not present

## 2024-04-11 DIAGNOSIS — E876 Hypokalemia: Secondary | ICD-10-CM

## 2024-04-11 DIAGNOSIS — I4901 Ventricular fibrillation: Secondary | ICD-10-CM | POA: Diagnosis not present

## 2024-04-11 LAB — BASIC METABOLIC PANEL WITH GFR
Anion gap: 3 — ABNORMAL LOW (ref 5–15)
BUN: 10 mg/dL (ref 6–20)
CO2: 34 mmol/L — ABNORMAL HIGH (ref 22–32)
Calcium: 7.5 mg/dL — ABNORMAL LOW (ref 8.9–10.3)
Chloride: 103 mmol/L (ref 98–111)
Creatinine, Ser: 0.58 mg/dL (ref 0.44–1.00)
GFR, Estimated: >60 mL/min (ref >60)
Glucose, Bld: 69 mg/dL — ABNORMAL LOW (ref 70–99)
Potassium: 3.8 mmol/L (ref 3.5–5.1)
Sodium: 140 mmol/L (ref 135–145)

## 2024-04-11 LAB — CBC
HCT: 28.6 % — ABNORMAL LOW (ref 36.0–46.0)
Hemoglobin: 8.4 g/dL — ABNORMAL LOW (ref 12.0–15.0)
MCH: 25.5 pg — ABNORMAL LOW (ref 26.0–34.0)
MCHC: 29.4 g/dL — ABNORMAL LOW (ref 30.0–36.0)
MCV: 86.9 fL (ref 80.0–100.0)
Platelets: 294 K/uL (ref 150–400)
RBC: 3.29 MIL/uL — ABNORMAL LOW (ref 3.87–5.11)
RDW: 19.9 % — ABNORMAL HIGH (ref 11.5–15.5)
WBC: 8.1 K/uL (ref 4.0–10.5)
nRBC: 0 % (ref 0.0–0.2)

## 2024-04-11 NOTE — Progress Notes (Signed)
 [facility]                                                                               Kristine Montgomery, is a 58 y.o. female, DOB - [birth year], FMW:[id number] Admit date - 04/04/2024    Outpatient Primary MD for the patient is [name], [name], NP  LOS - 7  days    Brief summary   58yo with h/o CAD s/p DES, HFrEF (improved from 30% to 55-60%), PAF, HLD, asthma, RA, and morbid obesity who presented on 11/19 with Vtach to decompensated vfib arrest requiring CPR and defibrillation x 1 minute with ROSC.  This was attributed to severe electrolyte disturbance with K+ 2.1 and Ca++ 7.1.  Electrolytes were repleted and she was admitted to [facility] ICU (from [facility]) for ongoing stabilization.  Cardiology consulted, given some diuresis.   Assessment & Plan    Assessment and Plan: * Cardiac arrest with ventricular fibrillation (HCC) In the setting of hypokalemia, hypocalcemia, and home Multaq  for Afib Troponins in ED 27>22 Cardiology consulted and has resumed Multaq ; they are now signed off Aggressive electrolyte repletion, now WNL Continue home Eliquis   Coronary artery disease involving native coronary artery of native heart with unstable angina pectoris (HCC) Most recent Echo 08/09/2022 EF 55-60%, grade I diastolic dysfunction, and normal RV BNP in ED 3741 Repeat Echo unremarkable with small pericardial effusion Restart lasix  at time of dc Continue home statin  Repeat chest x-ray showed pulmonary congestion no pneumothorax Repeat EKG did not show anything acute No documented hypoxia appreciated but she was started on oxygen  via high flow nasal cannula and will wean as tolerated Resume Farxiga  Has chronic LE edema, likely unrelated to current presentation; will add TED hose. Patient currently denies any chest pain or shortness of breath at this time  Tobacco abuse Quit smoking 4 years ago  Morbid obesity (HCC) Body mass index is 50.36 kg/m.Kristine Montgomery  Weight loss should be  encouraged Outpatient PCP/bariatric medicine f/u encouraged Significantly low or high BMI is associated with higher medical risk including morbidity and mortality   Chronic diarrhea Reports intermittent diarrhea since at least July Recommend outpatient follow-up with gastroenterologist No bowel movement today  Adjustment disorder with mixed anxiety and depressed mood Reports h/o mood disturbance at home Amplified by anxiety about cardiac arrest She is willing to try medication Added Lexapro  + buspirone  (standing for now, can change to prn once Lexapro  takes effect); she is noticing some improvement and feels less tense  Normocytic anemia Anemia of acute illness superimposed on anemia of chronic disease Patient's hemoglobin at baseline around 10 has dropped to 8.4 today. Continue to monitor and transfuse to keep it greater than 7.  Multiple rib fractures Likely related to CPR CTA with L anterior fractures of ribs 3-6, age indeterminate - not appreciated on xrays Continue lidocaine  patches and prn Norco for pain control PT/OT recommending SNF Awaiting for a bed at SNF  Pressure injury of skin Wound 04/04/24 2300 Pressure Injury Buttocks Bilateral Stage 2 -  Partial thickness loss of dermis presenting as a shallow open injury with a red, pink wound bed without slough. (Active)     Wound 04/04/24 2300 Pressure  Injury Buttocks Bilateral Stage 1 -  Intact skin with non-blanchable redness of a localized area usually over a bony prominence. (Active)  Local wound care  Chronic pain syndrome I have reviewed this patient in the [place] Controlled Substances Reporting System.  She is receiving medications from two providers but appears to be taking them as prescribed. She is at high risk of opioid misuse, diversion, or overdose.  Continue gabapentin , Norco  Acute on chronic combined systolic and diastolic CHF (congestive heart failure) (HCC) Most recent Echo 08/09/2022 EF 55-60%, grade I  diastolic dysfunction, and normal RV BNP in ED 3741 Repeat Echo unremarkable with small pericardial effusion Restart lasix  at time of dc Continue home statin  Repeat chest x-ray showed pulmonary congestion no pneumothorax Repeat EKG did not show anything acute No documented hypoxia appreciated but she was started on oxygen  via high flow nasal cannula and will wean as tolerated Resume Farxiga  Has chronic LE edema, likely unrelated to current presentation; will add TED hose  Hypokalemia Severe presenting hypokalemia from GI losses, which led to cardiac arrest GI losses have improved, no longer having n/v/d Replaced.  Potassium has been stable the last 3 days.  She denies any nausea or vomiting or diarrhea  Paroxysmal atrial fibrillation (HCC) Rate controlled with multaq  Continue Eliquis   Mixed hyperlipidemia Continue atorvastatin   Urinary tract infection without hematuria N/V/D prior to presentation UA in ED with large leukocytes, negative nitries, 21-50 WBC, and few bacteria Denies dysuria or pelvic pain Also with concern for multifocal PNA on chest CT Urine culture with >100k colonies Montgomery coli with resistance to Ampicillin , Unasyn , and Cipro   Completed the course of antibiotics.          RN Pressure Injury Documentation: Wound 04/04/24 2300 Pressure Injury Buttocks Bilateral Stage 2 -  Partial thickness loss of dermis presenting as a shallow open injury with a red, pink wound bed without slough. (Active)     Wound 04/04/24 2300 Pressure Injury Buttocks Bilateral Stage 1 -  Intact skin with non-blanchable redness of a localized area usually over a bony prominence. (Active)     Estimated body mass index is 49.6 kg/m as calculated from the following:   Height as of this encounter: 5' 1 (1.549 m).   Weight as of this encounter: 119.1 kg.  Code Status: Full code DVT Prophylaxis:  Place TED hose Start: 04/07/24 1440 SCDs Start: 04/04/24 2303 apixaban  (ELIQUIS ) tablet 5  mg   Level of Care: Level of care: Telemetry Family Communication: None at bedside  Disposition Plan:     Remains inpatient appropriate: Patient is medically stable for discharge waiting for SNF bed   Antimicrobials:   Anti-infectives (From admission, onward)    Start     Dose/Rate Route Frequency Ordered Stop   04/07/24 1000  cefTRIAXone  (ROCEPHIN ) 2 g in sodium chloride  0.9 % 100 mL IVPB        2 g 200 mL/hr over 30 Minutes Intravenous Every 24 hours 04/07/24 0914 04/10/24 1020   04/04/24 2015  Ampicillin -Sulbactam (UNASYN ) 3 g in sodium chloride  0.9 % 100 mL IVPB  Status:  Discontinued        3 g 200 mL/hr over 30 Minutes Intravenous Every 6 hours 04/04/24 2010 04/07/24 0913   04/04/24 1745  cefTRIAXone  (ROCEPHIN ) 1 g in sodium chloride  0.9 % 100 mL IVPB  Status:  Discontinued        1 g 200 mL/hr over 30 Minutes Intravenous  Once 04/04/24 1733 04/04/24 1739  04/04/24 1745  ciprofloxacin  (CIPRO ) IVPB 400 mg  Status:  Discontinued        400 mg 200 mL/hr over 60 Minutes Intravenous  Once 04/04/24 1739 04/04/24 1759        Medications  Scheduled Meds:  apixaban   5 mg Oral BID   atorvastatin   80 mg Oral Daily   busPIRone   5 mg Oral TID   Chlorhexidine  Gluconate Cloth  6 each Topical Daily   dapagliflozin  propanediol  10 mg Oral Daily   dronedarone   400 mg Oral BID WC   escitalopram   5 mg Oral Daily   gabapentin   200 mg Oral TID   lidocaine   2 patch Transdermal Q24H   polycarbophil  625 mg Oral Daily   potassium chloride   20 mEq Oral Daily   sodium chloride  flush  3 mL Intravenous Q12H   Continuous Infusions: PRN Meds:.acetaminophen , camphor-menthol , HYDROcodone -acetaminophen , loperamide , methocarbamol , ondansetron  (ZOFRAN ) IV, mouth rinse, polyethylene glycol, sodium chloride  flush    Subjective:   Kristine Montgomery was seen and examined today. No new complaints.   Objective:   Vitals:   04/11/24 0534 04/11/24 0600 04/11/24 1311 04/11/24 1556  BP: 119/73  115/76    Pulse: 73  71   Resp: 16  17   Temp: 97.7 F (36.5 C)  98 F (36.7 C)   TempSrc: Oral  Oral   SpO2: 94%  95% 91%  Weight:  119.1 kg    Height:        Intake/Output Summary (Last 24 hours) at 04/11/2024 1623 Last data filed at 04/11/2024 1339 Gross per 24 hour  Intake 240 ml  Output 350 ml  Net -110 ml   Filed Weights   04/07/24 0335 04/08/24 0500 04/11/24 0600  Weight: 117.2 kg 120.9 kg 119.1 kg     Exam General exam: Appears calm and comfortable  Respiratory system: Clear to auscultation. Respiratory effort normal. Cardiovascular system: S1 & S2 heard, RRR.  Gastrointestinal system: Abdomen is nondistended, soft and nontender.  Central nervous system: Alert and oriented.  Extremities: Symmetric 5 x 5 power. Skin: No rashes, Psychiatry: mood is appropriate.    Data Reviewed:  I have personally reviewed following labs and imaging studies   CBC Lab Results  Component Value Date   WBC 8.1 04/11/2024   RBC 3.29 (L) 04/11/2024   HGB 8.4 (L) 04/11/2024   HCT 28.6 (L) 04/11/2024   MCV 86.9 04/11/2024   MCH 25.5 (L) 04/11/2024   PLT 294 04/11/2024   MCHC 29.4 (L) 04/11/2024   RDW 19.9 (H) 04/11/2024   LYMPHSABS 2.0 04/05/2024   MONOABS 1.0 04/05/2024   EOSABS 0.0 04/05/2024   BASOSABS 0.0 04/05/2024     Last metabolic panel Lab Results  Component Value Date   NA 140 04/11/2024   K 3.8 04/11/2024   CL 103 04/11/2024   CO2 34 (H) 04/11/2024   BUN 10 04/11/2024   CREATININE 0.58 04/11/2024   GLUCOSE 69 (L) 04/11/2024   GFRNONAA >60 04/11/2024   GFRAA >60 10/28/2019   CALCIUM  7.5 (L) 04/11/2024   PHOS 4.2 04/06/2024   PROT 6.5 04/04/2024   ALBUMIN  2.0 (L) 04/04/2024   BILITOT 0.5 04/04/2024   ALKPHOS 101 04/04/2024   AST 37 04/04/2024   ALT 10 04/04/2024   ANIONGAP 3 (L) 04/11/2024    CBG (last 3)  No results for input(s): GLUCAP in the last 72 hours.    Coagulation Profile: No results for input(s): INR, PROTIME in the last 168  hours.   Radiology Studies: No results found.     Elgie Butter M.D. [facility] 04/11/2024, 4:23 PM  Available via Epic secure chat 7am-7pm After 7 pm, please refer to night coverage provider listed on amion.

## 2024-04-11 NOTE — Plan of Care (Signed)
  Problem: Education: Goal: Knowledge of General Education information will improve Description: Including pain rating scale, medication(s)/side effects and non-pharmacologic comfort measures Outcome: Progressing   Problem: Clinical Measurements: Goal: Ability to maintain clinical measurements within normal limits will improve Outcome: Progressing   Problem: Nutrition: Goal: Adequate nutrition will be maintained Outcome: Progressing   Problem: Elimination: Goal: Will not experience complications related to bowel motility Outcome: Progressing Goal: Will not experience complications related to urinary retention Outcome: Progressing   Problem: Pain Managment: Goal: General experience of comfort will improve and/or be controlled Outcome: Progressing   Problem: Safety: Goal: Ability to remain free from injury will improve Outcome: Progressing

## 2024-04-11 NOTE — TOC Progression Note (Signed)
 Transition of Care [facility]) - Progression Note   Patient Details  Name: Kristine Montgomery MRN: [id number] Date of Birth: Apr 10, 1966  [facility] CM/SW Contact  Duwaine GORMAN [name], [name] Phone Number: 04/11/2024, 11:27 AM  Clinical Narrative: OT note uploaded to [employer] portal for review. Awaiting PT note.  Expected Discharge Plan: Skilled Nursing Facility Barriers to Discharge: Continued Medical Work up, English As A Second Language Teacher, SNF Pending bed offer  Expected Discharge Plan and Services In-house Referral: NA Discharge Planning Services: CM Consult Post Acute Care Choice: Durable Medical Equipment, Home Health Living arrangements for the past 2 months: Apartment           DME Arranged: N/A DME Agency: NA HH Arranged: NA HH Agency: NA  Social Drivers of Health (SDOH) Interventions SDOH Screenings   Food Insecurity: No Food Insecurity (04/04/2024)  Housing: Low Risk  (04/04/2024)  Transportation Needs: No Transportation Needs (04/04/2024)  Utilities: Not At Risk (04/04/2024)  Alcohol Screen: Low Risk  (04/09/2020)  Depression (PHQ2-9): Low Risk  (04/09/2020)  Financial Resource Strain: Low Risk  (07/30/2020)  Physical Activity: Insufficiently Active (04/09/2020)  Social Connections: Socially Isolated (07/30/2020)  Stress: No Stress Concern Present (04/09/2020)  Tobacco Use: Medium Risk (04/04/2024)   Readmission Risk Interventions    04/06/2024    3:16 PM 11/09/2023   11:50 AM 11/06/2023    6:27 PM  Readmission Risk Prevention Plan  Transportation Screening Complete Complete Complete  PCP or Specialist Appt within 5-7 Days Complete  Complete  Home Care Screening Complete Complete Complete  Medication Review (RN CM) Complete Complete Complete

## 2024-04-11 NOTE — Progress Notes (Signed)
 Physical Therapy Treatment Patient Details Name: Kristine Montgomery MRN: [id number] DOB: 11/21/1965 Today's Date: 04/11/2024   History of Present Illness 58 year old female who presents as transfer from [facility] ED post Vfib arrest in the setting of hypokalemia. Pt received CPR.  L anterior 3-6 rib fractures.  She initially presented to the Ed with 4 days of nausea, vomiting and diarrhea. with history of CAD with NSTEMI s/p DES prox-LAD and prox-Lcx (2022), HFimpEF (EF 30% by echo in 07/2020 improved to 55-60% in 2024), paroxysmal Afib, HLD, asthma, RA and morbid obesity    PT Comments  Pt AxO x 3 pleasant and motivated.  Assisted with sitting EOB.  Required + 2 assist and use of pad to complete scooting.  Once upright, Pt was able to self static sit EOB x 8 min.   Prior Pt was living home alone and a few months ago she was able to self transfer using her walker.    LPT has rec Pt will need ST Rehab at SNF to address mobility and functional decline prior to safely returning home.    If plan is discharge home, recommend the following: Two people to help with walking and/or transfers;Assistance with cooking/housework;Two people to help with bathing/dressing/bathroom;Assist for transportation;Help with stairs or ramp for entrance;Direct supervision/assist for medications management   Can travel by private vehicle     No  Equipment Recommendations  None recommended by PT    Recommendations for Other Services       Precautions / Restrictions Precautions Precautions: Fall Precaution/Restrictions Comments: L 3-6 rib fractures 2* CPR (sternal awareness) monitor O2 Restrictions Weight Bearing Restrictions Per Provider Order: No     Mobility  Bed Mobility Overal bed mobility: Needs Assistance Bed Mobility: Rolling Rolling: +2 for physical assistance, Max assist, Used rails Sidelying to sit: Total assist, +2 for physical assistance   Sit to supine: +2 for physical assistance, +2 for  safety/equipment, Total assist   General bed mobility comments: Pt requires Total Assist + 2 for all bed mobility.  Pt was able to tolerate self static sitting EOB x 8 min once positioned midline.  Sats 93% on 2lts nasal.  Pt admits to shallow breathing due to Sternum pain with deep breathing.    Transfers      Currently, Pt requires Maxi Move LIFT for transfers                  Ambulation/Gait                   Stairs             Wheelchair Mobility     Tilt Bed    Modified Rankin (Stroke Patients Only)       Balance                                            Communication    Cognition Arousal: Alert Behavior During Therapy: WFL for tasks assessed/performed                           PT - Cognition Comments: AxO x 3 pleasant and motivated.        Cueing    Exercises      General Comments        Pertinent Vitals/Pain Pain Assessment Pain Assessment: 0-10  Pain Score: 8  Pain Location: chest Pain Descriptors / Indicators: Sore, Sharp Pain Intervention(s): Monitored during session    Home Living                          Prior Function            PT Goals (current goals can now be found in the care plan section) Progress towards PT goals: Progressing toward goals    Frequency    Min 3X/week      PT Plan      Co-evaluation              AM-PAC PT 6 Clicks Mobility   Outcome Measure  Help needed turning from your back to your side while in a flat bed without using bedrails?: Total Help needed moving from lying on your back to sitting on the side of a flat bed without using bedrails?: Total Help needed moving to and from a bed to a chair (including a wheelchair)?: Total Help needed standing up from a chair using your arms (e.g., wheelchair or bedside chair)?: Total Help needed to walk in hospital room?: Total Help needed climbing 3-5 steps with a railing? : Total 6 Click  Score: 6    End of Session   Activity Tolerance: Patient tolerated treatment well Patient left: in bed;with bed alarm set;with call bell/phone within reach Nurse Communication: Mobility status;Need for lift equipment PT Visit Diagnosis: Muscle weakness (generalized) (M62.81);Other abnormalities of gait and mobility (R26.89);Pain     Time: 8895-8881 PT Time Calculation (min) (ACUTE ONLY): 14 min  Charges:    $Therapeutic Activity: 8-22 mins PT General Charges $$ ACUTE PT VISIT: 1 Visit                    Katheryn Leap  PTA Acute  Rehabilitation Services Office M-F          [phone]

## 2024-04-12 DIAGNOSIS — D649 Anemia, unspecified: Secondary | ICD-10-CM | POA: Diagnosis not present

## 2024-04-12 DIAGNOSIS — I469 Cardiac arrest, cause unspecified: Secondary | ICD-10-CM | POA: Diagnosis not present

## 2024-04-12 DIAGNOSIS — I4901 Ventricular fibrillation: Secondary | ICD-10-CM | POA: Diagnosis not present

## 2024-04-12 MED ORDER — GUAIFENESIN-DM 100-10 MG/5ML PO SYRP
5.0000 mL | ORAL_SOLUTION | ORAL | Status: DC | PRN
  Administered 2024-04-12 – 2024-04-13 (×2): 5 mL via ORAL
  Filled 2024-04-12 (×3): qty 10

## 2024-04-12 NOTE — Progress Notes (Signed)
 [facility]                                                                               Kristine Montgomery, is a 58 y.o. female, DOB - May 30, 1965, FMW:[id number] Admit date - 04/04/2024    Outpatient Primary MD for the patient is [name], [name], NP  LOS - 8  days    Brief summary   58yo with h/o CAD s/p DES, HFrEF (improved from 30% to 55-60%), PAF, HLD, asthma, RA, and morbid obesity who presented on 11/19 with Vtach to decompensated vfib arrest requiring CPR and defibrillation x 1 minute with ROSC.  This was attributed to severe electrolyte disturbance with K+ 2.1 and Ca++ 7.1.  Electrolytes were repleted and she was admitted to [facility] ICU (from [facility]) for ongoing stabilization.  Cardiology consulted, given some diuresis.   Assessment & Plan    Assessment and Plan: * Cardiac arrest with ventricular fibrillation (HCC) In the setting of hypokalemia, hypocalcemia, and home Multaq  for Afib Troponins in ED 27>22 Cardiology consulted and has resumed Multaq ; they are now signed off Aggressive electrolyte repletion, now WNL Continue home Eliquis . Patient denies any new complaints.   Coronary artery disease involving native coronary artery of native heart with unstable angina pectoris (HCC) Most recent Echo 08/09/2022 EF 55-60%, grade I diastolic dysfunction, and normal RV BNP in ED 3741 Repeat Echo unremarkable with small pericardial effusion Restart lasix  at time of dc Continue home statin  Repeat chest x-ray showed pulmonary congestion no pneumothorax Repeat EKG did not show anything acute No documented hypoxia appreciated but she was started on oxygen  via high flow nasal cannula and will wean as tolerated Resume Farxiga  Has chronic LE edema, likely unrelated to current presentation; will add TED hose. Patient currently denies any chest pain or shortness of breath at this time  Tobacco abuse Quit smoking 4 years ago  Morbid obesity (HCC) Body mass index is 50.36 kg/m.[name]   Weight loss should be encouraged Outpatient PCP/bariatric medicine f/u encouraged Significantly low or high BMI is associated with higher medical risk including morbidity and mortality   Chronic diarrhea Reports intermittent diarrhea since at least July Recommend outpatient follow-up with gastroenterologist Appears to have improved. No documented bowel movements.   Adjustment disorder with mixed anxiety and depressed mood Reports h/o mood disturbance at home Amplified by anxiety about cardiac arrest She is willing to try medication Added Lexapro  + buspirone  (standing for now, can change to prn once Lexapro  takes effect); she is noticing some improvement and feels less tense. Currently denies any depression.   Normocytic anemia Anemia of acute illness superimposed on anemia of chronic disease Patient's hemoglobin at baseline around 10 has dropped to 8.4 today. Continue to monitor and transfuse to keep it greater than 7. No new labs today.  Multiple rib fractures Likely related to CPR CTA with L anterior fractures of ribs 3-6, age indeterminate - not appreciated on xrays Continue lidocaine  patches and prn Norco for pain control PT/OT recommending SNF Awaiting for a bed at SNF  Pressure injury of skin Wound 04/04/24 2300 Pressure Injury Buttocks Bilateral Stage 2 -  Partial thickness loss of dermis presenting as a  shallow open injury with a red, pink wound bed without slough. (Active)     Wound 04/04/24 2300 Pressure Injury Buttocks Bilateral Stage 1 -  Intact skin with non-blanchable redness of a localized area usually over a bony prominence. (Active)  Local wound care  Chronic pain syndrome I have reviewed this patient in the [place] Controlled Substances Reporting System.  She is receiving medications from two providers but appears to be taking them as prescribed. She is at high risk of opioid misuse, diversion, or overdose.  Continue gabapentin , Norco  Acute on chronic  combined systolic and diastolic CHF (congestive heart failure) (HCC) Most recent Echo 08/09/2022 EF 55-60%, grade I diastolic dysfunction, and normal RV BNP in ED 3741 Repeat Echo unremarkable with small pericardial effusion Restart lasix  at time of dc Continue home statin  Repeat chest x-ray showed pulmonary congestion no pneumothorax Repeat EKG did not show anything acute No documented hypoxia appreciated but she was started on oxygen  via high flow nasal cannula and will wean as tolerated Resume Farxiga  Has chronic LE edema, likely unrelated to current presentation; will add TED hose  Hypokalemia Severe presenting hypokalemia from GI losses, which led to cardiac arrest GI losses have improved, no longer having n/v/d Replaced.  Potassium has been stable the last 3 days.  She denies any nausea or vomiting or diarrhea  Paroxysmal atrial fibrillation (HCC) Rate controlled with multaq  Continue Eliquis   Mixed hyperlipidemia Continue atorvastatin   Urinary tract infection without hematuria N/V/D prior to presentation UA in ED with large leukocytes, negative nitries, 21-50 WBC, and few bacteria Denies dysuria or pelvic pain Also with concern for multifocal PNA on chest CT Urine culture with >100k colonies Montgomery coli with resistance to Ampicillin , Unasyn , and Cipro   Completed the course of antibiotics.     RN Pressure Injury Documentation: Wound 04/04/24 2300 Pressure Injury Buttocks Bilateral Stage 2 -  Partial thickness loss of dermis presenting as a shallow open injury with a red, pink wound bed without slough. (Active)     Wound 04/04/24 2300 Pressure Injury Buttocks Bilateral Stage 1 -  Intact skin with non-blanchable redness of a localized area usually over a bony prominence. (Active)     Estimated body mass index is 50.71 kg/m as calculated from the following:   Height as of this encounter: 5' 1 (1.549 m).   Weight as of this encounter: 121.7 kg.  Code Status: Full  code DVT Prophylaxis:  Place TED hose Start: 04/07/24 1440 SCDs Start: 04/04/24 2303 apixaban  (ELIQUIS ) tablet 5 mg   Level of Care: Level of care: Telemetry Family Communication: None at bedside  Disposition Plan:     Remains inpatient appropriate: Patient is medically stable for discharge waiting for SNF bed   Antimicrobials:   Anti-infectives (From admission, onward)    Start     Dose/Rate Route Frequency Ordered Stop   04/07/24 1000  cefTRIAXone  (ROCEPHIN ) 2 g in sodium chloride  0.9 % 100 mL IVPB        2 g 200 mL/hr over 30 Minutes Intravenous Every 24 hours 04/07/24 0914 04/10/24 1020   04/04/24 2015  Ampicillin -Sulbactam (UNASYN ) 3 g in sodium chloride  0.9 % 100 mL IVPB  Status:  Discontinued        3 g 200 mL/hr over 30 Minutes Intravenous Every 6 hours 04/04/24 2010 04/07/24 0913   04/04/24 1745  cefTRIAXone  (ROCEPHIN ) 1 g in sodium chloride  0.9 % 100 mL IVPB  Status:  Discontinued        1  g 200 mL/hr over 30 Minutes Intravenous  Once 04/04/24 1733 04/04/24 1739   04/04/24 1745  ciprofloxacin  (CIPRO ) IVPB 400 mg  Status:  Discontinued        400 mg 200 mL/hr over 60 Minutes Intravenous  Once 04/04/24 1739 04/04/24 1759        Medications  Scheduled Meds:  apixaban   5 mg Oral BID   atorvastatin   80 mg Oral Daily   busPIRone   5 mg Oral TID   Chlorhexidine  Gluconate Cloth  6 each Topical Daily   dapagliflozin  propanediol  10 mg Oral Daily   dronedarone   400 mg Oral BID WC   escitalopram   5 mg Oral Daily   gabapentin   200 mg Oral TID   lidocaine   2 patch Transdermal Q24H   polycarbophil  625 mg Oral Daily   potassium chloride   20 mEq Oral Daily   sodium chloride  flush  3 mL Intravenous Q12H   Continuous Infusions: PRN Meds:.acetaminophen , camphor-menthol , HYDROcodone -acetaminophen , loperamide , methocarbamol , ondansetron  (ZOFRAN ) IV, mouth rinse, polyethylene glycol, sodium chloride  flush    Subjective:   Aoife Bold was seen and examined today.  Disappointed that she couldn't be discharged .  No new complaints.   Objective:   Vitals:   04/12/24 0546 04/12/24 0912 04/12/24 1334 04/12/24 1746  BP: 119/72  (!) 155/81 139/84  Pulse: 70  76 70  Resp: 20  19   Temp: 97.8 F (36.6 C)  98.5 F (36.9 C)   TempSrc: Oral  Oral   SpO2: 97% 94% 93%   Weight:      Height:        Intake/Output Summary (Last 24 hours) at 04/12/2024 1817 Last data filed at 04/12/2024 1746 Gross per 24 hour  Intake 360 ml  Output 350 ml  Net 10 ml   Filed Weights   04/08/24 0500 04/11/24 0600 04/12/24 0500  Weight: 120.9 kg 119.1 kg 121.7 kg     Exam General exam: Appears calm and comfortable  Respiratory system: Clear to auscultation. Respiratory effort normal. Cardiovascular system: S1 & S2 heard, RRR.  Gastrointestinal system: Abdomen is nondistended, soft and nontender.    Data Reviewed:  I have personally reviewed following labs and imaging studies   CBC Lab Results  Component Value Date   WBC 8.1 04/11/2024   RBC 3.29 (L) 04/11/2024   HGB 8.4 (L) 04/11/2024   HCT 28.6 (L) 04/11/2024   MCV 86.9 04/11/2024   MCH 25.5 (L) 04/11/2024   PLT 294 04/11/2024   MCHC 29.4 (L) 04/11/2024   RDW 19.9 (H) 04/11/2024   LYMPHSABS 2.0 04/05/2024   MONOABS 1.0 04/05/2024   EOSABS 0.0 04/05/2024   BASOSABS 0.0 04/05/2024     Last metabolic panel Lab Results  Component Value Date   NA 140 04/11/2024   K 3.8 04/11/2024   CL 103 04/11/2024   CO2 34 (H) 04/11/2024   BUN 10 04/11/2024   CREATININE 0.58 04/11/2024   GLUCOSE 69 (L) 04/11/2024   GFRNONAA >60 04/11/2024   GFRAA >60 10/28/2019   CALCIUM  7.5 (L) 04/11/2024   PHOS 4.2 04/06/2024   PROT 6.5 04/04/2024   ALBUMIN  2.0 (L) 04/04/2024   BILITOT 0.5 04/04/2024   ALKPHOS 101 04/04/2024   AST 37 04/04/2024   ALT 10 04/04/2024   ANIONGAP 3 (L) 04/11/2024    CBG (last 3)  No results for input(s): GLUCAP in the last 72 hours.    Coagulation Profile: No results for  input(s): INR, PROTIME in the  last 168 hours.   Radiology Studies: No results found.     Elgie Butter M.D. [facility] 04/12/2024, 6:17 PM  Available via Epic secure chat 7am-7pm After 7 pm, please refer to night coverage provider listed on amion.

## 2024-04-13 DIAGNOSIS — D649 Anemia, unspecified: Secondary | ICD-10-CM | POA: Diagnosis not present

## 2024-04-13 DIAGNOSIS — I4901 Ventricular fibrillation: Secondary | ICD-10-CM | POA: Diagnosis not present

## 2024-04-13 DIAGNOSIS — I469 Cardiac arrest, cause unspecified: Secondary | ICD-10-CM | POA: Diagnosis not present

## 2024-04-13 NOTE — Plan of Care (Signed)
  Problem: Clinical Measurements: Goal: Respiratory complications will improve Outcome: Progressing   Problem: Clinical Measurements: Goal: Cardiovascular complication will be avoided Outcome: Progressing   Problem: Nutrition: Goal: Adequate nutrition will be maintained Outcome: Progressing   

## 2024-04-13 NOTE — TOC Progression Note (Signed)
 Transition of Care [facility]) - Progression Note    Patient Details  Name: Kristine Montgomery MRN: [id number] Date of Birth: 1965/12/23  Transition of Care [facility] CM/SW Contact  Sonda Manuella Quill, RN Phone Number: 04/13/2024, 9:51 AM  Clinical Narrative:    Ins auth pending in [employer]; no Plan auth #, Auth ID # [id number]; awaiting decision   Expected Discharge Plan: Skilled Nursing Facility Barriers to Discharge: Continued Medical Work up, English As A [employer] Language Teacher, SNF Pending bed offer               Expected Discharge Plan and Services In-house Referral: NA Discharge Planning Services: CM Consult Post Acute Care Choice: Durable Medical Equipment, Home Health Living arrangements for the past 2 months: Apartment                 DME Arranged: N/A DME Agency: NA       HH Arranged: NA HH Agency: NA         Social Drivers of Health (SDOH) Interventions SDOH Screenings   Food Insecurity: No Food Insecurity (04/04/2024)  Housing: Low Risk  (04/04/2024)  Transportation Needs: No Transportation Needs (04/04/2024)  Utilities: Not At Risk (04/04/2024)  Alcohol Screen: Low Risk  (04/09/2020)  Depression (PHQ2-9): Low Risk  (04/09/2020)  Financial Resource Strain: Low Risk  (07/30/2020)  Physical Activity: Insufficiently Active (04/09/2020)  Social Connections: Socially Isolated (07/30/2020)  Stress: No Stress Concern Present (04/09/2020)  Tobacco Use: Medium Risk (04/04/2024)    Readmission Risk Interventions    04/06/2024    3:16 PM 11/09/2023   11:50 AM 11/06/2023    6:27 PM  Readmission Risk Prevention Plan  Transportation Screening Complete Complete Complete  PCP or Specialist Appt within 5-7 Days Complete  Complete  Home Care Screening Complete Complete Complete  Medication Review (RN CM) Complete Complete Complete

## 2024-04-13 NOTE — Progress Notes (Signed)
 [facility]                                                                               Kristine Montgomery, is a 58 y.o. female, DOB - 15-Jul-1965, FMW:[id number] Admit date - 04/04/2024    Outpatient Primary MD for the patient is [name], [name], NP  LOS - 9  days    Brief summary   58yo with h/o CAD s/p DES, HFrEF (improved from 30% to 55-60%), PAF, HLD, asthma, RA, and morbid obesity who presented on 11/19 with Vtach to decompensated vfib arrest requiring CPR and defibrillation x 1 minute with ROSC.  This was attributed to severe electrolyte disturbance with K+ 2.1 and Ca++ 7.1.  Electrolytes were repleted and she was admitted to [facility] ICU (from [facility]) for ongoing stabilization.  Cardiology consulted, and have signed off.  Patient is medically stable for discharge and she waiting for insurance auth.    Assessment & Plan    Assessment and Plan: * Cardiac arrest with ventricular fibrillation (HCC) In the setting of hypokalemia, hypocalcemia, and home Multaq  for Afib Troponins in ED 27>22 Cardiology consulted and has resumed Multaq ; they are now signed off Aggressive electrolyte repletion, now WNL Continue home Eliquis . Patient denies any new complaints.   Coronary artery disease involving native coronary artery of native heart with unstable angina pectoris (HCC) Most recent Echo 08/09/2022 EF 55-60%, grade I diastolic dysfunction, and normal RV BNP in ED 3741 Repeat Echo unremarkable with small pericardial effusion Restart lasix  at time of dc Continue home statin  Repeat chest x-ray showed pulmonary congestion no pneumothorax Repeat EKG did not show anything acute No documented hypoxia appreciated but she was started on oxygen  via high flow nasal cannula and will wean as tolerated Resume Farxiga  Has chronic LE edema, likely unrelated to current presentation; will add TED hose. She denies any chest pain or sob.  No new events overnight.   Tobacco abuse Quit smoking 4  years ago  Morbid obesity (HCC) Body mass index is 50.36 kg/m.[name]  Weight loss should be encouraged Outpatient PCP/bariatric medicine f/u encouraged Significantly low or high BMI is associated with higher medical risk including morbidity and mortality   Chronic diarrhea Reports intermittent diarrhea since at least July Recommend outpatient follow-up with gastroenterologist Appears to have improved. No documented bowel movements.   Adjustment disorder with mixed anxiety and depressed mood Reports h/o mood disturbance at home Amplified by anxiety about cardiac arrest She is willing to try medication Added Lexapro  + buspirone  (standing for now, can change to prn once Lexapro  takes effect); she is noticing some improvement and feels less tense. Currently denies any depression.   Normocytic anemia Anemia of acute illness superimposed on anemia of chronic disease Patient's hemoglobin at baseline around 10 has dropped to 8.4 today. Continue to monitor and transfuse to keep it greater than 7. No new labs today.  Multiple rib fractures Likely related to CPR CTA with L anterior fractures of ribs 3-6, age indeterminate - not appreciated on xrays Continue lidocaine  patches and prn Norco for pain control PT/OT recommending SNF Awaiting for a bed at SNF  Pressure injury of skin Wound 04/04/24 2300 Pressure  Injury Buttocks Bilateral Stage 2 -  Partial thickness loss of dermis presenting as a shallow open injury with a red, pink wound bed without slough. (Active)     Wound 04/04/24 2300 Pressure Injury Buttocks Bilateral Stage 1 -  Intact skin with non-blanchable redness of a localized area usually over a bony prominence. (Active)  Local wound care  Chronic pain syndrome I have reviewed this patient in the [place] Controlled Substances Reporting System.  She is receiving medications from two providers but appears to be taking them as prescribed. She is at high risk of opioid misuse, diversion,  or overdose.  Continue gabapentin , Norco  Acute on chronic combined systolic and diastolic CHF (congestive heart failure) (HCC) Most recent Echo 08/09/2022 EF 55-60%, grade I diastolic dysfunction, and normal RV BNP in ED 3741 Repeat Echo unremarkable with small pericardial effusion Restart lasix  at time of dc Continue home statin  Repeat chest x-ray showed pulmonary congestion no pneumothorax Repeat EKG did not show anything acute No documented hypoxia appreciated but she was started on oxygen  via high flow nasal cannula and will wean as tolerated Resume Farxiga  Has chronic LE edema, likely unrelated to current presentation; will add TED hose  Hypokalemia Severe presenting hypokalemia from GI losses, which led to cardiac arrest GI losses have improved, no longer having n/v/d Replaced.  Potassium has been stable the last 3 days. No new labs ordered today.  She denies any nausea or vomiting or diarrhea  Paroxysmal atrial fibrillation (HCC) Rate controlled with multaq  Continue Eliquis   Mixed hyperlipidemia Continue atorvastatin   Urinary tract infection without hematuria N/V/D prior to presentation UA in ED with large leukocytes, negative nitries, 21-50 WBC, and few bacteria Denies dysuria or pelvic pain Also with concern for multifocal PNA on chest CT Urine culture with >100k colonies Montgomery coli with resistance to Ampicillin , Unasyn , and Cipro   Completed the course of antibiotics.     RN Pressure Injury Documentation: Wound 04/04/24 2300 Pressure Injury Buttocks Bilateral Stage 2 -  Partial thickness loss of dermis presenting as a shallow open injury with a red, pink wound bed without slough. (Active)     Wound 04/04/24 2300 Pressure Injury Buttocks Bilateral Stage 1 -  Intact skin with non-blanchable redness of a localized area usually over a bony prominence. (Active)     Estimated body mass index is 49.62 kg/m as calculated from the following:   Height as of this encounter:  5' 1 (1.549 m).   Weight as of this encounter: 119.1 kg.  Code Status: Full code DVT Prophylaxis:  Place TED hose Start: 04/07/24 1440 SCDs Start: 04/04/24 2303 apixaban  (ELIQUIS ) tablet 5 mg   Level of Care: Level of care: Telemetry Family Communication: None at bedside  Disposition Plan:     Remains inpatient appropriate: Patient is medically stable for discharge waiting for SNF bed   Antimicrobials:   Anti-infectives (From admission, onward)    Start     Dose/Rate Route Frequency Ordered Stop   04/07/24 1000  cefTRIAXone  (ROCEPHIN ) 2 g in sodium chloride  0.9 % 100 mL IVPB        2 g 200 mL/hr over 30 Minutes Intravenous Every 24 hours 04/07/24 0914 04/10/24 1020   04/04/24 2015  Ampicillin -Sulbactam (UNASYN ) 3 g in sodium chloride  0.9 % 100 mL IVPB  Status:  Discontinued        3 g 200 mL/hr over 30 Minutes Intravenous Every 6 hours 04/04/24 2010 04/07/24 0913   04/04/24 1745  cefTRIAXone  (ROCEPHIN ) 1 g  in sodium chloride  0.9 % 100 mL IVPB  Status:  Discontinued        1 g 200 mL/hr over 30 Minutes Intravenous  Once 04/04/24 1733 04/04/24 1739   04/04/24 1745  ciprofloxacin  (CIPRO ) IVPB 400 mg  Status:  Discontinued        400 mg 200 mL/hr over 60 Minutes Intravenous  Once 04/04/24 1739 04/04/24 1759        Medications  Scheduled Meds:  apixaban   5 mg Oral BID   atorvastatin   80 mg Oral Daily   busPIRone   5 mg Oral TID   Chlorhexidine  Gluconate Cloth  6 each Topical Daily   dapagliflozin  propanediol  10 mg Oral Daily   dronedarone   400 mg Oral BID WC   escitalopram   5 mg Oral Daily   gabapentin   200 mg Oral TID   lidocaine   2 patch Transdermal Q24H   polycarbophil  625 mg Oral Daily   potassium chloride   20 mEq Oral Daily   sodium chloride  flush  3 mL Intravenous Q12H   Continuous Infusions: PRN Meds:.acetaminophen , camphor-menthol , guaiFENesin -dextromethorphan , HYDROcodone -acetaminophen , loperamide , methocarbamol , ondansetron  (ZOFRAN ) IV, mouth rinse,  polyethylene glycol, sodium chloride  flush    Subjective:   Kristine Montgomery was seen and examined today.  No new events overnight.   Objective:   Vitals:   04/12/24 2007 04/13/24 0500 04/13/24 0619 04/13/24 1328  BP: 118/83  106/76 (!) 166/82  Pulse: 70  68 67  Resp: 18  16   Temp: 97.8 F (36.6 C)  98 F (36.7 C) 97.6 F (36.4 C)  TempSrc:   Oral   SpO2: 92%  96% 97%  Weight:  119.1 kg    Height:        Intake/Output Summary (Last 24 hours) at 04/13/2024 1522 Last data filed at 04/13/2024 1039 Gross per 24 hour  Intake 3 ml  Output 600 ml  Net -597 ml   Filed Weights   04/11/24 0600 04/12/24 0500 04/13/24 0500  Weight: 119.1 kg 121.7 kg 119.1 kg     Exam General exam: Appears calm and comfortable  Respiratory system: Clear to auscultation. Respiratory effort normal. Cardiovascular system: S1 & S2 heard, RRR.  Gastrointestinal system: Abdomen is nondistended, soft and nontender. Central nervous system: Alert and oriented.  Extremities: no edema.  Skin: No rashes, Psychiatry: mood is appropriate.     Data Reviewed:  I have personally reviewed following labs and imaging studies   CBC Lab Results  Component Value Date   WBC 8.1 04/11/2024   RBC 3.29 (L) 04/11/2024   HGB 8.4 (L) 04/11/2024   HCT 28.6 (L) 04/11/2024   MCV 86.9 04/11/2024   MCH 25.5 (L) 04/11/2024   PLT 294 04/11/2024   MCHC 29.4 (L) 04/11/2024   RDW 19.9 (H) 04/11/2024   LYMPHSABS 2.0 04/05/2024   MONOABS 1.0 04/05/2024   EOSABS 0.0 04/05/2024   BASOSABS 0.0 04/05/2024     Last metabolic panel Lab Results  Component Value Date   NA 140 04/11/2024   K 3.8 04/11/2024   CL 103 04/11/2024   CO2 34 (H) 04/11/2024   BUN 10 04/11/2024   CREATININE 0.58 04/11/2024   GLUCOSE 69 (L) 04/11/2024   GFRNONAA >60 04/11/2024   GFRAA >60 10/28/2019   CALCIUM  7.5 (L) 04/11/2024   PHOS 4.2 04/06/2024   PROT 6.5 04/04/2024   ALBUMIN  2.0 (L) 04/04/2024   BILITOT 0.5 04/04/2024   ALKPHOS 101  04/04/2024   AST 37 04/04/2024   ALT  10 04/04/2024   ANIONGAP 3 (L) 04/11/2024    CBG (last 3)  No results for input(s): GLUCAP in the last 72 hours.    Coagulation Profile: No results for input(s): INR, PROTIME in the last 168 hours.   Radiology Studies: No results found.     Elgie Butter M.D. [facility] 04/13/2024, 3:22 PM  Available via Epic secure chat 7am-7pm After 7 pm, please refer to night coverage provider listed on amion.

## 2024-04-13 NOTE — Progress Notes (Signed)
 Occupational Therapy Treatment Patient Details Name: Kristine Montgomery MRN: [id number] DOB: November 11, 1965 Today's Date: 04/13/2024   History of present illness 58 year old female who presents as transfer from [facility] ED post Vfib arrest in the setting of hypokalemia. Pt received CPR.  L anterior 3-6 rib fractures.  She initially presented to the Ed with 4 days of nausea, vomiting and diarrhea. 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 Patient eager and open to all therapy presented including OOB to recliner, BADL's and breathing exercises to improve chest expansion and overall activity tolerance. Patient will benefit from continued inpatient follow up therapy, <3 hours/day. Patient requires continued Acute care hospital level OT services to progress safety and functional performance and allow for discharge.        If plan is discharge home, recommend the following:  Two people to help with walking and/or transfers;Two people to help with bathing/dressing/bathroom;Assistance with cooking/housework;Assistance with feeding;Assist for transportation;Help with stairs or ramp for entrance;Direct supervision/assist for medications management;Direct supervision/assist for financial management   Equipment Recommendations  Hospital bed (if home)       Precautions / Restrictions Precautions Precautions: Fall Precaution/Restrictions Comments: L 3-6 rib fractures 2* CPR (sternal awareness) monitor O2 Restrictions Weight Bearing Restrictions Per Provider Order: No       Mobility Bed Mobility Overal bed mobility: Needs Assistance Bed Mobility: Rolling Rolling: +2 for physical assistance, Max assist, Used rails         General bed mobility comments: Pt requires Max Assist + 2 for all bed mobility,  SpO2 96% on 4 ltrs this session    Transfers Overall transfer level: Needs assistance Equipment used: Ambulation equipment used Transfers: Bed to chair/wheelchair/BSC             General transfer comment: tolerated maxi move from bed to recliner with no SOB or pain Transfer via Lift Equipment: Maximove   Balance Overall balance assessment: Needs assistance Sitting-balance support: Feet supported Sitting balance-Leahy Scale: Fair                                     ADL either performed or assessed with clinical judgement   ADL Overall ADL's : Needs assistance/impaired Eating/Feeding: Modified independent;Sitting   Grooming: Wash/dry hands;Wash/dry face;Oral care;Sitting;Brushing hair;Set up Grooming Details (indicate cue type and reason): recliner level Upper Body Bathing: Sitting;Minimal assistance   Lower Body Bathing: Total assistance;Bed level   Upper Body Dressing : Sitting;Minimal assistance;Moderate assistance   Lower Body Dressing: Total assistance;+2 for physical assistance;+2 for safety/equipment;Bed level               Functional mobility during ADLs: +2 for physical assistance;+2 for safety/equipment;Total assistance (tolerated maximove OOB) General ADL Comments: improved overall tolerance for OOB, ADL's and IS breathing ex    Extremity/Trunk Assessment Upper Extremity Assessment Upper Extremity Assessment: Generalized weakness;Right hand dominant RUE Deficits / Details: tested at shoulder level due to rib fx/pain from cpr RUE Coordination: decreased fine motor;decreased gross motor LUE Deficits / Details: tested ROM only to shoulder level due to rib fx from cpr LUE Coordination: decreased fine motor;decreased gross motor   Lower Extremity Assessment Lower Extremity Assessment: Defer to PT evaluation  Communication Communication Communication: No apparent difficulties   Cognition Arousal: Alert Behavior During  Therapy: WFL for tasks assessed/performed Cognition: No apparent impairments                               Following commands: Intact        Cueing   Cueing Techniques: Verbal cues  Exercises Exercises: Other exercises (breathing with IS 10 reps recliner level) General Exercises - Lower Extremity Ankle Circles/Pumps: AROM, Both, 10 reps, Seated            Pertinent Vitals/ Pain       Pain Assessment Pain Assessment: 0-10 Pain Score: 4  Facial Expression: Relaxed, neutral Body Movements: Absence of movements Muscle Tension: Relaxed Compliance with ventilator (intubated pts.): N/A Vocalization (extubated pts.): N/A CPOT Total: 0 Pain Location: chest Pain Descriptors / Indicators: Sore Pain Intervention(s): Monitored during session, Premedicated before session, Repositioned   Frequency  Min 2X/week        Progress Toward Goals  OT Goals(current goals can now be found in the care plan section)  Progress towards OT goals: Progressing toward goals  Acute Rehab OT Goals Patient Stated Goal: to get to rehab OT Goal Formulation: With patient/family Time For Goal Achievement: 04/20/24 Potential to Achieve Goals: Fair ADL Goals Pt Will Perform Grooming: with set-up;sitting Pt Will Perform Upper Body Bathing: with min assist;sitting Pt Will Perform Upper Body Dressing: with min assist;sitting Pt/caregiver will Perform Home Exercise Program: With theraputty;With written HEP provided;With Supervision;Increased strength;Both right and left upper extremity Additional ADL Goal #1: patient will teach back 4/5 ECT principles and sternal precautions (informal d/t rib fxs from chest compression) with min cues  Plan         AM-PAC OT 6 Clicks Daily Activity     Outcome Measure   Help from another person eating meals?: None Help from another person taking care of personal grooming?: A Little Help from another person toileting, which includes using toliet,  bedpan, or urinal?: Total Help from another person bathing (including washing, rinsing, drying)?: A Lot Help from another person to put on and taking off regular upper body clothing?: A Little Help from another person to put on and taking off regular lower body clothing?: Total 6 Click Score: 14    End of Session Equipment Utilized During Treatment: Oxygen ;Other (comment) (maximove)  OT Visit Diagnosis: Other abnormalities of gait and mobility (R26.89);Muscle weakness (generalized) (M62.81);Pain Pain - part of body:  (chest and ribs)   Activity Tolerance Patient tolerated treatment well   Patient Left with call bell/phone within reach;in chair;with chair alarm set;with family/visitor present   Nurse Communication Mobility status        Time: 1130-1205 OT Time Calculation (min): 35 min  Charges: OT General Charges $OT Visit: 1 Visit OT Treatments $Self Care/Home Management : 8-22 mins $Therapeutic Activity: 8-22 mins  Lonie Rummell OT/L Acute Rehabilitation Department  [phone]  04/13/2024, 12:14 PM

## 2024-04-14 DIAGNOSIS — I4901 Ventricular fibrillation: Secondary | ICD-10-CM | POA: Diagnosis not present

## 2024-04-14 DIAGNOSIS — D649 Anemia, unspecified: Secondary | ICD-10-CM | POA: Diagnosis not present

## 2024-04-14 DIAGNOSIS — I469 Cardiac arrest, cause unspecified: Secondary | ICD-10-CM | POA: Diagnosis not present

## 2024-04-14 LAB — CBC WITH DIFFERENTIAL/PLATELET
Abs Immature Granulocytes: 0.07 K/uL (ref 0.00–0.07)
Basophils Absolute: 0 K/uL (ref 0.0–0.1)
Basophils Relative: 1 %
Eosinophils Absolute: 0.1 K/uL (ref 0.0–0.5)
Eosinophils Relative: 2 %
HCT: 27.9 % — ABNORMAL LOW (ref 36.0–46.0)
Hemoglobin: 8 g/dL — ABNORMAL LOW (ref 12.0–15.0)
Immature Granulocytes: 1 %
Lymphocytes Relative: 31 %
Lymphs Abs: 2.4 K/uL (ref 0.7–4.0)
MCH: 25 pg — ABNORMAL LOW (ref 26.0–34.0)
MCHC: 28.7 g/dL — ABNORMAL LOW (ref 30.0–36.0)
MCV: 87.2 fL (ref 80.0–100.0)
Monocytes Absolute: 0.7 K/uL (ref 0.1–1.0)
Monocytes Relative: 9 %
Neutro Abs: 4.4 K/uL (ref 1.7–7.7)
Neutrophils Relative %: 56 %
Platelets: 295 K/uL (ref 150–400)
RBC: 3.2 MIL/uL — ABNORMAL LOW (ref 3.87–5.11)
RDW: 20 % — ABNORMAL HIGH (ref 11.5–15.5)
WBC: 7.8 K/uL (ref 4.0–10.5)
nRBC: 0 % (ref 0.0–0.2)

## 2024-04-14 LAB — BASIC METABOLIC PANEL WITH GFR
Anion gap: 2 — ABNORMAL LOW (ref 5–15)
BUN: 8 mg/dL (ref 6–20)
CO2: 34 mmol/L — ABNORMAL HIGH (ref 22–32)
Calcium: 7.5 mg/dL — ABNORMAL LOW (ref 8.9–10.3)
Chloride: 102 mmol/L (ref 98–111)
Creatinine, Ser: 0.41 mg/dL — ABNORMAL LOW (ref 0.44–1.00)
GFR, Estimated: >60 mL/min (ref >60)
Glucose, Bld: 72 mg/dL (ref 70–99)
Potassium: 3.5 mmol/L (ref 3.5–5.1)
Sodium: 138 mmol/L (ref 135–145)

## 2024-04-14 MED ORDER — POTASSIUM CHLORIDE 20 MEQ PO PACK
40.0000 meq | PACK | Freq: Once | ORAL | Status: AC
  Administered 2024-04-14: 40 meq via ORAL
  Filled 2024-04-14: qty 2

## 2024-04-14 MED ORDER — POTASSIUM CHLORIDE CRYS ER 20 MEQ PO TBCR
40.0000 meq | EXTENDED_RELEASE_TABLET | Freq: Once | ORAL | Status: DC
  Filled 2024-04-14: qty 2

## 2024-04-14 NOTE — Plan of Care (Signed)
  Problem: Clinical Measurements: Goal: Cardiovascular complication will be avoided Outcome: Progressing   Problem: Clinical Measurements: Goal: Respiratory complications will improve Outcome: Progressing   Problem: Nutrition: Goal: Adequate nutrition will be maintained Outcome: Progressing   Problem: Elimination: Goal: Will not experience complications related to urinary retention Outcome: Progressing

## 2024-04-14 NOTE — Progress Notes (Signed)
 [facility]                                                                               Kristine Montgomery, is a 58 y.o. female, DOB - [birth year], FMW:[id number] Admit date - 04/04/2024    Outpatient Primary MD for the patient is [name], [name], NP  LOS - 10  days    Brief summary   58yo with h/o CAD s/p DES, HFrEF (improved from 30% to 55-60%), PAF, HLD, asthma, RA, and morbid obesity who presented on 11/19 with Vtach to decompensated vfib arrest requiring CPR and defibrillation x 1 minute with ROSC.  This was attributed to severe electrolyte disturbance with K+ 2.1 and Ca++ 7.1.  Electrolytes were repleted and she was admitted to [facility] ICU (from [facility]) for ongoing stabilization.  Cardiology consulted, and have signed off.  Patient is medically stable for discharge and she waiting for insurance auth.  New labs reviewed, Montgomery new events.   Assessment & Plan    Assessment and Plan: * Cardiac arrest with ventricular fibrillation (HCC) In the setting of hypokalemia, hypocalcemia, and home Multaq  for Afib Troponins in ED 27>22 Cardiology consulted and has resumed Multaq ; they are now signed off Aggressive electrolyte repletion, now WNL Continue home Eliquis . Patient denies any new complaints.   Coronary artery disease involving native coronary artery of native heart with unstable angina pectoris (HCC) Most recent Echo 08/09/2022 EF 55-60%, grade I diastolic dysfunction, and normal RV BNP in ED 3741 Repeat Echo unremarkable with small pericardial effusion Restart lasix  at time of dc Continue home statin  Repeat chest x-ray showed pulmonary congestion Montgomery pneumothorax Repeat EKG did not show anything acute Montgomery documented hypoxia appreciated but she was started on oxygen  via high flow nasal cannula and will wean as tolerated Resume Farxiga  Has chronic LE edema, likely unrelated to current presentation; will add TED hose. She denies any chest pain or sob.  Montgomery new events overnight.    Tobacco abuse Quit smoking 4 years ago  Morbid obesity (HCC) Body mass index is 50.36 kg/m.[name]  Weight loss should be encouraged Outpatient PCP/bariatric medicine f/u encouraged Significantly low or high BMI is associated with higher medical risk including morbidity and mortality   Chronic diarrhea Reports intermittent diarrhea since at least July Recommend outpatient follow-up with gastroenterologist Appears to have improved. Montgomery documented bowel movements.   Adjustment disorder with mixed anxiety and depressed mood Reports h/o mood disturbance at home Amplified by anxiety about cardiac arrest She is willing to try medication Added Lexapro  + buspirone  (standing for now, can change to prn once Lexapro  takes effect); she is noticing some improvement and feels less tense. Currently denies any depression.   Normocytic anemia Anemia of acute illness superimposed on anemia of chronic disease Patient's hemoglobin at baseline around 10 has dropped to 8.4 to 8 today. Continue to monitor and transfuse to keep it greater than 7.   Multiple rib fractures Likely related to CPR CTA with L anterior fractures of ribs 3-6, age indeterminate - not appreciated on xrays Continue lidocaine  patches and prn Norco for pain control PT/OT recommending SNF Awaiting for a bed at SNF. She denies any pain.  Pressure injury of skin Wound 04/04/24 2300 Pressure Injury Buttocks Bilateral Stage 2 -  Partial thickness loss of dermis presenting as a shallow open injury with a red, pink wound bed without slough. (Active)     Wound 04/04/24 2300 Pressure Injury Buttocks Bilateral Stage 1 -  Intact skin with non-blanchable redness of a localized area usually over a bony prominence. (Active)  Local wound care  Chronic pain syndrome I have reviewed this patient in the [place] Controlled Substances Reporting System.  She is receiving medications from two providers but appears to be taking them as prescribed. She is  at high risk of opioid misuse, diversion, or overdose.  Continue gabapentin , Norco  Acute on chronic combined systolic and diastolic CHF (congestive heart failure) (HCC) Most recent Echo 08/09/2022 EF 55-60%, grade I diastolic dysfunction, and normal RV BNP in ED 3741 Repeat Echo unremarkable with small pericardial effusion Restart lasix  at time of dc Continue home statin  Repeat chest x-ray showed pulmonary congestion Montgomery pneumothorax Repeat EKG did not show anything acute Montgomery documented hypoxia appreciated but she was started on oxygen  via high flow nasal cannula and will wean as tolerated Resume Farxiga  Has chronic LE edema, likely unrelated to current presentation; will add TED hose  Hypokalemia Severe presenting hypokalemia from GI losses, which led to cardiac arrest GI losses have improved, Montgomery longer having n/v/d Replaced.  Potassium has been stable the last 3 days. Montgomery new labs ordered today.  She denies any nausea or vomiting or diarrhea  Paroxysmal atrial fibrillation (HCC) Rate controlled with multaq  Continue Eliquis   Mixed hyperlipidemia Continue atorvastatin   Urinary tract infection without hematuria N/V/D prior to presentation UA in ED with large leukocytes, negative nitries, 21-50 WBC, and few bacteria Denies dysuria or pelvic pain Also with concern for multifocal PNA on chest CT Urine culture with >100k colonies E coli with resistance to Ampicillin , Unasyn , and Cipro   Completed the course of antibiotics.  Leukocytosis resolved.     RN Pressure Injury Documentation: Wound 04/04/24 2300 Pressure Injury Buttocks Bilateral Stage 2 -  Partial thickness loss of dermis presenting as a shallow open injury with a red, pink wound bed without slough. (Active)     Wound 04/04/24 2300 Pressure Injury Buttocks Bilateral Stage 1 -  Intact skin with non-blanchable redness of a localized area usually over a bony prominence. (Active)     Estimated body mass index is 49.9 kg/m  as calculated from the following:   Height as of this encounter: 5' 1 (1.549 m).   Weight as of this encounter: 119.8 kg.  Code Status: Full code DVT Prophylaxis:  Place TED hose Start: 04/07/24 1440 SCDs Start: 04/04/24 2303 apixaban  (ELIQUIS ) tablet 5 mg   Level of Care: Level of care: Telemetry Family Communication: None at bedside  Disposition Plan:     Remains inpatient appropriate: Patient is medically stable for discharge waiting for SNF bed   Antimicrobials:   Anti-infectives (From admission, onward)    Start     Dose/Rate Route Frequency Ordered Stop   04/07/24 1000  cefTRIAXone  (ROCEPHIN ) 2 g in sodium chloride  0.9 % 100 mL IVPB        2 g 200 mL/hr over 30 Minutes Intravenous Every 24 hours 04/07/24 0914 04/10/24 1020   04/04/24 2015  Ampicillin -Sulbactam (UNASYN ) 3 g in sodium chloride  0.9 % 100 mL IVPB  Status:  Discontinued        3 g 200 mL/hr over 30 Minutes Intravenous Every 6 hours 04/04/24 2010  04/07/24 0913   04/04/24 1745  cefTRIAXone  (ROCEPHIN ) 1 g in sodium chloride  0.9 % 100 mL IVPB  Status:  Discontinued        1 g 200 mL/hr over 30 Minutes Intravenous  Once 04/04/24 1733 04/04/24 1739   04/04/24 1745  ciprofloxacin  (CIPRO ) IVPB 400 mg  Status:  Discontinued        400 mg 200 mL/hr over 60 Minutes Intravenous  Once 04/04/24 1739 04/04/24 1759        Medications  Scheduled Meds:  apixaban   5 mg Oral BID   atorvastatin   80 mg Oral Daily   busPIRone   5 mg Oral TID   Chlorhexidine  Gluconate Cloth  6 each Topical Daily   dapagliflozin  propanediol  10 mg Oral Daily   dronedarone   400 mg Oral BID WC   escitalopram   5 mg Oral Daily   gabapentin   200 mg Oral TID   lidocaine   2 patch Transdermal Q24H   polycarbophil  625 mg Oral Daily   potassium chloride   20 mEq Oral Daily   potassium chloride   40 mEq Oral Once   sodium chloride  flush  3 mL Intravenous Q12H   Continuous Infusions: PRN Meds:.acetaminophen , camphor-menthol ,  guaiFENesin -dextromethorphan , HYDROcodone -acetaminophen , loperamide , methocarbamol , ondansetron  (ZOFRAN ) IV, mouth rinse, polyethylene glycol, sodium chloride  flush    Subjective:   Anavictoria Wilk was seen and examined today.   Montgomery events overnight.   Objective:   Vitals:   04/13/24 1932 04/14/24 0502 04/14/24 1211 04/14/24 1242  BP: 125/86 121/83  122/72  Pulse: 71 68  72  Resp: 20 13  18   Temp: 99 F (37.2 C) (!) 97.4 F (36.3 C)  98.2 F (36.8 C)  TempSrc: Oral Oral  Oral  SpO2: 95% 95% 92% 90%  Weight:  119.8 kg    Height:        Intake/Output Summary (Last 24 hours) at 04/14/2024 1639 Last data filed at 04/13/2024 1900 Gross per 24 hour  Intake 240 ml  Output 400 ml  Net -160 ml   Filed Weights   04/12/24 0500 04/13/24 0500 04/14/24 0502  Weight: 121.7 kg 119.1 kg 119.8 kg     Exam General exam: Appears calm and comfortable  Respiratory system: Clear to auscultation. Respiratory effort normal. Cardiovascular system: S1 & S2 heard, RRR.  Gastrointestinal system: Abdomen is soft bs+    Data Reviewed:  I have personally reviewed following labs and imaging studies   CBC Lab Results  Component Value Date   WBC 7.8 04/14/2024   RBC 3.20 (L) 04/14/2024   HGB 8.0 (L) 04/14/2024   HCT 27.9 (L) 04/14/2024   MCV 87.2 04/14/2024   MCH 25.0 (L) 04/14/2024   PLT 295 04/14/2024   MCHC 28.7 (L) 04/14/2024   RDW 20.0 (H) 04/14/2024   LYMPHSABS 2.4 04/14/2024   MONOABS 0.7 04/14/2024   EOSABS 0.1 04/14/2024   BASOSABS 0.0 04/14/2024     Last metabolic panel Lab Results  Component Value Date   NA 138 04/14/2024   K 3.5 04/14/2024   CL 102 04/14/2024   CO2 34 (H) 04/14/2024   BUN 8 04/14/2024   CREATININE 0.41 (L) 04/14/2024   GLUCOSE 72 04/14/2024   GFRNONAA >60 04/14/2024   GFRAA >60 10/28/2019   CALCIUM  7.5 (L) 04/14/2024   PHOS 4.2 04/06/2024   PROT 6.5 04/04/2024   ALBUMIN  2.0 (L) 04/04/2024   BILITOT 0.5 04/04/2024   ALKPHOS 101 04/04/2024    AST 37 04/04/2024   ALT 10  04/04/2024   ANIONGAP 2 (L) 04/14/2024    CBG (last 3)  Montgomery results for input(s): GLUCAP in the last 72 hours.    Coagulation Profile: Montgomery results for input(s): INR, PROTIME in the last 168 hours.   Radiology Studies: Montgomery results found.     Elgie Butter M.D. [facility] 04/14/2024, 4:39 PM  Available via Epic secure chat 7am-7pm After 7 pm, please refer to night coverage provider listed on amion.

## 2024-04-15 DIAGNOSIS — I4901 Ventricular fibrillation: Secondary | ICD-10-CM | POA: Diagnosis not present

## 2024-04-15 DIAGNOSIS — D649 Anemia, unspecified: Secondary | ICD-10-CM | POA: Diagnosis not present

## 2024-04-15 DIAGNOSIS — I469 Cardiac arrest, cause unspecified: Secondary | ICD-10-CM | POA: Diagnosis not present

## 2024-04-15 NOTE — Plan of Care (Signed)
  Problem: Activity: Goal: Risk for activity intolerance will decrease Outcome: Progressing   Problem: Nutrition: Goal: Adequate nutrition will be maintained Outcome: Progressing   Problem: Safety: Goal: Ability to remain free from injury will improve Outcome: Progressing   Problem: Pain Managment: Goal: General experience of comfort will improve and/or be controlled Outcome: Progressing   Problem: Skin Integrity: Goal: Risk for impaired skin integrity will decrease Outcome: Progressing

## 2024-04-15 NOTE — Progress Notes (Signed)
 Physical Therapy Treatment Patient Details Name: Kristine Montgomery MRN: [id number] DOB: 02/12/1966 Today's Date: 04/15/2024   History of Present Illness 58 year old female who presents as transfer from [facility] ED post Vfib arrest in the setting of hypokalemia. Pt received CPR.  L anterior 3-6 rib fractures.  She initially presented to the Ed with 4 days of nausea, vomiting and diarrhea. 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  Pt uses mechanical lift at home for OOB transfers.  She require total assistance for rolling and scooting.  Pt was able to move into modified long sitting with bed in reverse trendelberg with use of rails.  Pt performed forward reaching tasks at edge of bed to encourage weight bearing in B feet sitting edge of bed.  Continue to recommend rehab in a post acute setting to maximize functional gains before returning home to family and her caregiver.      If plan is discharge home, recommend the following: Two people to help with walking and/or transfers;Assistance with cooking/housework;Two people to help with bathing/dressing/bathroom;Assist for transportation;Help with stairs or ramp for entrance;Direct supervision/assist for medications management   Can travel by private vehicle     No  Equipment Recommendations  None recommended by PT    Recommendations for Other Services       Precautions / Restrictions Precautions Precautions: Fall Precaution/Restrictions Comments: L 3-6 rib fractures 2* CPR (sternal awareness) monitor O2 Restrictions Weight Bearing Restrictions Per Provider Order: No     Mobility  Bed Mobility Overal bed mobility: Needs Assistance Bed Mobility: Rolling, Supine to Sit Rolling: +2 for physical assistance, Max assist, Used rails   Supine to sit: Min  assist, Max assist (Min assistance with bed in reverse trend for gravity assisted transfer.  Pt able to moce trunk into long sitting with use of rails and required assistance for B LEs to edge of bed and increased assistance for scooting to edge of bed for sitting balance.)     General bed mobility comments: Pt able to assist more with bed in reverse trended position.  She continues to lack ability to scoot on her on and required assistance for forward weightshifting when scooting to improve ease.  Performed lateral scoots to the R to position before lying back down in bed.  Pt continues to require assistance for lowering trunk and lifting B LEs against grvaity back to bed.    Transfers Overall transfer level:  (mechanical lift at baseline.)                      Ambulation/Gait                   Stairs             Wheelchair Mobility     Tilt Bed    Modified Rankin (Stroke Patients Only)       Balance Overall balance assessment: Needs assistance Sitting-balance support: Feet supported Sitting balance-Leahy Scale: Fair Sitting balance - Comments: initially required assist 2* posterior lean, then fair. Pt sat EOB ~8 minutes. Performed long arc quads and forward reaching tasks. Postural control: Posterior lean  Communication Communication Communication: No apparent difficulties  Cognition Arousal: Alert Behavior During Therapy: WFL for tasks assessed/performed   PT - Cognitive impairments: No apparent impairments                       PT - Cognition Comments: AxO x 3 pleasant and motivated. Following commands: Intact      Cueing Cueing Techniques: Verbal cues  Exercises General Exercises - Lower Extremity Ankle Circles/Pumps: AROM, Both, 10 reps, Supine Quad Sets: AROM, Both, 10 reps, Supine Long Arc Quad: AROM, Both, 10 reps, Seated Heel Slides: AAROM, Both, 10 reps, Supine Hip  ABduction/ADduction: AAROM, Both, 10 reps, Supine    General Comments        Pertinent Vitals/Pain Pain Assessment Pain Assessment: Faces Pain Score: 4  Pain Location: chest Pain Descriptors / Indicators: Sore Pain Intervention(s): Repositioned    Home Living                          Prior Function            PT Goals (current goals can now be found in the care plan section) Acute Rehab PT Goals Patient Stated Goal: likes to play Bingo Potential to Achieve Goals: Fair Progress towards PT goals: Progressing toward goals    Frequency    Min 3X/week      PT Plan      Co-evaluation              AM-PAC PT 6 Clicks Mobility   Outcome Measure  Help needed turning from your back to your side while in a flat bed without using bedrails?: Total Help needed moving from lying on your back to sitting on the side of a flat bed without using bedrails?: Total Help needed moving to and from a bed to a chair (including a wheelchair)?: Total Help needed standing up from a chair using your arms (e.g., wheelchair or bedside chair)?: Total Help needed to walk in hospital room?: Total Help needed climbing 3-5 steps with a railing? : Total 6 Click Score: 6    End of Session Equipment Utilized During Treatment: Gait belt Activity Tolerance: Patient tolerated treatment well Patient left: in bed;with bed alarm set;with call bell/phone within reach Nurse Communication: Mobility status;Need for lift equipment PT Visit Diagnosis: Muscle weakness (generalized) (M62.81);Other abnormalities of gait and mobility (R26.89);Pain     Time: 1345-1408 PT Time Calculation (min) (ACUTE ONLY): 23 min  Charges:    $Therapeutic Exercise: 8-22 mins $Therapeutic Activity: 8-22 mins PT General Charges $$ ACUTE PT VISIT: 1 Visit                     Toya HAMS , PTA Acute Rehabilitation Services Office [phone]    Trevyn Lumpkin JINNY Gosling 04/15/2024, 2:15 PM

## 2024-04-15 NOTE — Progress Notes (Signed)
 [facility]                                                                               Kristine Montgomery, is a 58 y.o. female, DOB - [birth year], FMW:[id number] Admit date - 04/04/2024    Outpatient Primary MD for the patient is [name], [name], NP  LOS - 11  days    Brief summary   58yo with h/o CAD s/p DES, HFrEF (improved from 30% to 55-60%), PAF, HLD, asthma, RA, and morbid obesity who presented on 11/19 with Vtach to decompensated vfib arrest requiring CPR and defibrillation x 1 minute with ROSC.  This was attributed to severe electrolyte disturbance with K+ 2.1 and Ca++ 7.1.  Electrolytes were repleted and she was admitted to [facility] ICU (from [facility]) for ongoing stabilization.  Cardiology consulted, and have signed off.  Patient is medically stable for discharge and she waiting for insurance auth.  New labs reviewed, no new events.   Assessment & Plan    Assessment and Plan: * Cardiac arrest with ventricular fibrillation (HCC) In the setting of hypokalemia, hypocalcemia, and home Multaq  for Afib Troponins in ED 27>22 Cardiology consulted and has resumed Multaq ; they are now signed off Aggressive electrolyte repletion, now WNL Continue home Eliquis . Patient denies any new complaints.   Coronary artery disease involving native coronary artery of native heart with unstable angina pectoris (HCC) Most recent Echo 08/09/2022 EF 55-60%, grade I diastolic dysfunction, and normal RV BNP in ED 3741 Repeat Echo unremarkable with small pericardial effusion Restart lasix  at time of dc Continue home statin  Repeat chest x-ray showed pulmonary congestion no pneumothorax Repeat EKG did not show anything acute No documented hypoxia appreciated but she was started on oxygen  via high flow nasal cannula and will wean as tolerated Resume Farxiga  Has chronic LE edema, likely unrelated to current presentation; will add TED hose. She denies any chest pain or sob.  No new events overnight.    Tobacco abuse Quit smoking 4 years ago  Morbid obesity (HCC) Body mass index is 50.36 kg/m.[name]  Weight loss should be encouraged Outpatient PCP/bariatric medicine f/u encouraged Significantly low or high BMI is associated with higher medical risk including morbidity and mortality   Chronic diarrhea Reports intermittent diarrhea since at least July Recommend outpatient follow-up with gastroenterologist Appears to have improved. No documented bowel movements.   Adjustment disorder with mixed anxiety and depressed mood Reports h/o mood disturbance at home Amplified by anxiety about cardiac arrest She is willing to try medication Added Lexapro  + buspirone  (standing for now, can change to prn once Lexapro  takes effect); she is noticing some improvement and feels less tense. Currently denies any depression.   Normocytic anemia Anemia of acute illness superimposed on anemia of chronic disease Patient's hemoglobin at baseline around 10 has dropped to 8.4 to 8 today. Continue to monitor and transfuse to keep it greater than 7.   Multiple rib fractures Likely related to CPR CTA with L anterior fractures of ribs 3-6, age indeterminate - not appreciated on xrays Continue lidocaine  patches and prn Norco for pain control PT/OT recommending SNF Awaiting for a bed at SNF. She denies any pain.  Pressure injury of skin Wound 04/04/24 2300 Pressure Injury Buttocks Bilateral Stage 2 -  Partial thickness loss of dermis presenting as a shallow open injury with a red, pink wound bed without slough. (Active)     Wound 04/04/24 2300 Pressure Injury Buttocks Bilateral Stage 1 -  Intact skin with non-blanchable redness of a localized area usually over a bony prominence. (Active)  Local wound care  Chronic pain syndrome I have reviewed this patient in the [place] Controlled Substances Reporting System.  She is receiving medications from two providers but appears to be taking them as prescribed. She is  at high risk of opioid misuse, diversion, or overdose.  Continue gabapentin , Norco.  No new complaints.   Acute on chronic combined systolic and diastolic CHF (congestive heart failure) (HCC) Most recent Echo 08/09/2022 EF 55-60%, grade I diastolic dysfunction, and normal RV BNP in ED 3741 Repeat Echo unremarkable with small pericardial effusion Restart lasix  at time of dc Continue home statin  Repeat chest x-ray showed pulmonary congestion no pneumothorax Repeat EKG did not show anything acute No documented hypoxia appreciated but she was started on oxygen  via high flow nasal cannula and will wean as tolerated Resume Farxiga  Has chronic LE edema, likely unrelated to current presentation.  Hypokalemia Severe presenting hypokalemia from GI losses, which led to cardiac arrest GI losses have improved, no longer having n/v/d Replaced.  Potassium has been stable the last 3 days.   Paroxysmal atrial fibrillation (HCC) Rate controlled with multaq  Continue Eliquis   Mixed hyperlipidemia Continue atorvastatin   Urinary tract infection without hematuria N/V/D prior to presentation UA in ED with large leukocytes, negative nitries, 21-50 WBC, and few bacteria Denies dysuria or pelvic pain Also with concern for multifocal PNA on chest CT Urine culture with >100k colonies E coli with resistance to Ampicillin , Unasyn , and Cipro   Completed the course of antibiotics.  Leukocytosis resolved.     RN Pressure Injury Documentation: Wound 04/04/24 2300 Pressure Injury Buttocks Bilateral Stage 2 -  Partial thickness loss of dermis presenting as a shallow open injury with a red, pink wound bed without slough. (Active)     Wound 04/04/24 2300 Pressure Injury Buttocks Bilateral Stage 1 -  Intact skin with non-blanchable redness of a localized area usually over a bony prominence. (Active)     Estimated body mass index is 50.49 kg/m as calculated from the following:   Height as of this encounter:  5' 1 (1.549 m).   Weight as of this encounter: 121.2 kg.  Code Status: Full code DVT Prophylaxis:  Place TED hose Start: 04/07/24 1440 SCDs Start: 04/04/24 2303 apixaban  (ELIQUIS ) tablet 5 mg   Level of Care: Level of care: Telemetry Family Communication: None at bedside  Disposition Plan:     Remains inpatient appropriate: Patient is medically stable for discharge waiting for SNF bed   Antimicrobials:   Anti-infectives (From admission, onward)    Start     Dose/Rate Route Frequency Ordered Stop   04/07/24 1000  cefTRIAXone  (ROCEPHIN ) 2 g in sodium chloride  0.9 % 100 mL IVPB        2 g 200 mL/hr over 30 Minutes Intravenous Every 24 hours 04/07/24 0914 04/10/24 1020   04/04/24 2015  Ampicillin -Sulbactam (UNASYN ) 3 g in sodium chloride  0.9 % 100 mL IVPB  Status:  Discontinued        3 g 200 mL/hr over 30 Minutes Intravenous Every 6 hours 04/04/24 2010 04/07/24 0913   04/04/24 1745  cefTRIAXone  (ROCEPHIN ) 1 g in  sodium chloride  0.9 % 100 mL IVPB  Status:  Discontinued        1 g 200 mL/hr over 30 Minutes Intravenous  Once 04/04/24 1733 04/04/24 1739   04/04/24 1745  ciprofloxacin  (CIPRO ) IVPB 400 mg  Status:  Discontinued        400 mg 200 mL/hr over 60 Minutes Intravenous  Once 04/04/24 1739 04/04/24 1759        Medications  Scheduled Meds:  apixaban   5 mg Oral BID   atorvastatin   80 mg Oral Daily   busPIRone   5 mg Oral TID   Chlorhexidine  Gluconate Cloth  6 each Topical Daily   dapagliflozin  propanediol  10 mg Oral Daily   dronedarone   400 mg Oral BID WC   escitalopram   5 mg Oral Daily   gabapentin   200 mg Oral TID   lidocaine   2 patch Transdermal Q24H   polycarbophil  625 mg Oral Daily   potassium chloride   20 mEq Oral Daily   sodium chloride  flush  3 mL Intravenous Q12H   Continuous Infusions: PRN Meds:.acetaminophen , camphor-menthol , guaiFENesin -dextromethorphan , HYDROcodone -acetaminophen , loperamide , methocarbamol , ondansetron  (ZOFRAN ) IV, mouth rinse,  polyethylene glycol, sodium chloride  flush    Subjective:   Kristine Montgomery was seen and examined today.   No events overnight.   Objective:   Vitals:   04/14/24 2013 04/15/24 0118 04/15/24 0500 04/15/24 0628  BP: 127/75 119/77  96/66  Pulse: 71 72  91  Resp: 16 17  18   Temp: 98.4 F (36.9 C) 98.3 F (36.8 C)  97.9 F (36.6 C)  TempSrc: Oral Oral  Oral  SpO2: 93% 91%  (!) 79%  Weight:   121.2 kg   Height:        Intake/Output Summary (Last 24 hours) at 04/15/2024 0857 Last data filed at 04/14/2024 2000 Gross per 24 hour  Intake 420 ml  Output 400 ml  Net 20 ml   Filed Weights   04/13/24 0500 04/14/24 0502 04/15/24 0500  Weight: 119.1 kg 119.8 kg 121.2 kg     Exam General exam: Appears calm and comfortable  Respiratory system: Clear to auscultation. Respiratory effort normal. Cardiovascular system: S1 & S2 heard, RRR Gastrointestinal system: Abdomen is nondistended, soft and nontender.  Central nervous system: Alert and oriented.  Extremities: Symmetric 5 x 5 power. Skin: No rashes, Psychiatry: Mood & affect appropriate.      Data Reviewed:  I have personally reviewed following labs and imaging studies   CBC Lab Results  Component Value Date   WBC 7.8 04/14/2024   RBC 3.20 (L) 04/14/2024   HGB 8.0 (L) 04/14/2024   HCT 27.9 (L) 04/14/2024   MCV 87.2 04/14/2024   MCH 25.0 (L) 04/14/2024   PLT 295 04/14/2024   MCHC 28.7 (L) 04/14/2024   RDW 20.0 (H) 04/14/2024   LYMPHSABS 2.4 04/14/2024   MONOABS 0.7 04/14/2024   EOSABS 0.1 04/14/2024   BASOSABS 0.0 04/14/2024     Last metabolic panel Lab Results  Component Value Date   NA 138 04/14/2024   K 3.5 04/14/2024   CL 102 04/14/2024   CO2 34 (H) 04/14/2024   BUN 8 04/14/2024   CREATININE 0.41 (L) 04/14/2024   GLUCOSE 72 04/14/2024   GFRNONAA >60 04/14/2024   GFRAA >60 10/28/2019   CALCIUM  7.5 (L) 04/14/2024   PHOS 4.2 04/06/2024   PROT 6.5 04/04/2024   ALBUMIN  2.0 (L) 04/04/2024   BILITOT 0.5  04/04/2024   ALKPHOS 101 04/04/2024   AST 37 04/04/2024  ALT 10 04/04/2024   ANIONGAP 2 (L) 04/14/2024    CBG (last 3)  No results for input(s): GLUCAP in the last 72 hours.    Coagulation Profile: No results for input(s): INR, PROTIME in the last 168 hours.   Radiology Studies: No results found.     Elgie Butter M.D. [facility] 04/15/2024, 8:57 AM  Available via Epic secure chat 7am-7pm After 7 pm, please refer to night coverage provider listed on amion.

## 2024-04-16 DIAGNOSIS — I4901 Ventricular fibrillation: Secondary | ICD-10-CM | POA: Diagnosis not present

## 2024-04-16 DIAGNOSIS — D649 Anemia, unspecified: Secondary | ICD-10-CM | POA: Diagnosis not present

## 2024-04-16 DIAGNOSIS — I469 Cardiac arrest, cause unspecified: Secondary | ICD-10-CM | POA: Diagnosis not present

## 2024-04-16 MED ORDER — GERHARDT'S BUTT CREAM
TOPICAL_CREAM | Freq: Four times a day (QID) | CUTANEOUS | Status: DC
  Filled 2024-04-16: qty 60

## 2024-04-16 NOTE — Plan of Care (Signed)

## 2024-04-16 NOTE — Progress Notes (Signed)
 [facility]                                                                               Kristine Montgomery, is a 57 y.o. female, DOB - [birth year], FMW:[id number] Admit date - 04/04/2024    Outpatient Primary MD for the patient is [name], [name], NP  LOS - 12  days    Brief summary   58yo with h/o CAD s/p DES, HFrEF (improved from 30% to 55-60%), PAF, HLD, asthma, RA, and morbid obesity who presented on 11/19 with Vtach to decompensated vfib arrest requiring CPR and defibrillation x 1 minute with ROSC.  This was attributed to severe electrolyte disturbance with K+ 2.1 and Ca++ 7.1.  Electrolytes were repleted and she was admitted to [facility] ICU (from [facility]) for ongoing stabilization.  Cardiology consulted, and have signed off.  Patient is medically stable for discharge and she waiting for insurance auth.   Patient had two loose bowel movements today.   Assessment & Plan    Assessment and Plan: * Cardiac arrest with ventricular fibrillation (HCC) In the setting of hypokalemia, hypocalcemia, and home Multaq  for Afib Troponins in ED 27>22 Cardiology consulted and has resumed Multaq ; they are now signed off Aggressive electrolyte repletion, now WNL Continue home Eliquis . Patient denies any new complaints.   Coronary artery disease involving native coronary artery of native heart with unstable angina pectoris (HCC) Most recent Echo 08/09/2022 EF 55-60%, grade I diastolic dysfunction, and normal RV BNP in ED 3741 Repeat Echo unremarkable with small pericardial effusion Restart lasix  at time of dc Continue home statin  Repeat chest x-ray showed pulmonary congestion no pneumothorax Repeat EKG did not show anything acute No documented hypoxia appreciated but she was started on oxygen  via high flow nasal cannula and will wean as tolerated Resume Farxiga  Has chronic LE edema, likely unrelated to current presentation; will add TED hose. She denies any chest pain or sob.  No new  events overnight.   Tobacco abuse Quit smoking 4 years ago  Morbid obesity (HCC) Body mass index is 50.36 kg/m.[name]  Weight loss should be encouraged Outpatient PCP/bariatric medicine f/u encouraged Significantly low or high BMI is associated with higher medical risk including morbidity and mortality   Chronic diarrhea Reports intermittent diarrhea since at least July Recommend outpatient follow-up with gastroenterologist Two loose BM today, prn imodium  given.  Will get labs in am.   Adjustment disorder with mixed anxiety and depressed mood Reports h/o mood disturbance at home Amplified by anxiety about cardiac arrest She is willing to try medication Added Lexapro  + buspirone  (standing for now, can change to prn once Lexapro  takes effect); she is noticing some improvement and feels less tense. Currently denies any depression.   Normocytic anemia Anemia of acute illness superimposed on anemia of chronic disease Patient's hemoglobin at baseline around 10 has dropped to 8.4 to 8 today. Continue to monitor and transfuse to keep it greater than 7.   Multiple rib fractures Likely related to CPR CTA with L anterior fractures of ribs 3-6, age indeterminate - not appreciated on xrays Continue lidocaine  patches and prn Norco for pain control PT/OT recommending SNF Awaiting for a  bed at SNF. She denies any pain.   Pressure injury of skin Wound 04/04/24 2300 Pressure Injury Buttocks Bilateral Stage 2 -  Partial thickness loss of dermis presenting as a shallow open injury with a red, pink wound bed without slough. (Active)     Wound 04/04/24 2300 Pressure Injury Buttocks Bilateral Stage 1 -  Intact skin with non-blanchable redness of a localized area usually over a bony prominence. (Active)  Local wound care  Chronic pain syndrome I have reviewed this patient in the [place] Controlled Substances Reporting System.  She is receiving medications from two providers but appears to be taking them  as prescribed. She is at high risk of opioid misuse, diversion, or overdose.  Continue gabapentin , Norco.  No new complaints.   Acute on chronic combined systolic and diastolic CHF (congestive heart failure) (HCC) Most recent Echo 08/09/2022 EF 55-60%, grade I diastolic dysfunction, and normal RV BNP in ED 3741 Repeat Echo unremarkable with small pericardial effusion Restart lasix  at time of dc Continue home statin  Repeat chest x-ray showed pulmonary congestion no pneumothorax Repeat EKG did not show anything acute No documented hypoxia appreciated but she was started on oxygen  via high flow nasal cannula and will wean as tolerated Resume Farxiga  Has chronic LE edema, likely unrelated to current presentation.  Hypokalemia Severe presenting hypokalemia from GI losses, which led to cardiac arrest GI losses have improved, no longer having n/v/d Replaced.  Potassium has been stable the last 3 days.   Paroxysmal atrial fibrillation (HCC) Rate controlled with multaq  Continue Eliquis   Mixed hyperlipidemia Continue atorvastatin   Urinary tract infection without hematuria N/V/D prior to presentation UA in ED with large leukocytes, negative nitries, 21-50 WBC, and few bacteria Denies dysuria or pelvic pain Also with concern for multifocal PNA on chest CT Urine culture with >100k colonies E coli with resistance to Ampicillin , Unasyn , and Cipro   Completed the course of antibiotics.  Leukocytosis resolved.     Unfortunately patient's insurance denies SNF , fast appeal initiated by the patient.  Toc on board.    RN Pressure Injury Documentation: Wound 04/04/24 2300 Pressure Injury Buttocks Bilateral Stage 2 -  Partial thickness loss of dermis presenting as a shallow open injury with a red, pink wound bed without slough. (Active)     Wound 04/04/24 2300 Pressure Injury Buttocks Bilateral Stage 1 -  Intact skin with non-blanchable redness of a localized area usually over a bony  prominence. (Active)     Estimated body mass index is 50.49 kg/m as calculated from the following:   Height as of this encounter: 5' 1 (1.549 m).   Weight as of this encounter: 121.2 kg.  Code Status: Full code DVT Prophylaxis:  Place TED hose Start: 04/07/24 1440 SCDs Start: 04/04/24 2303 apixaban  (ELIQUIS ) tablet 5 mg   Level of Care: Level of care: Telemetry Family Communication: None at bedside  Disposition Plan:     Remains inpatient appropriate: Patient is medically stable for discharge waiting for SNF bed   Antimicrobials:   Anti-infectives (From admission, onward)    Start     Dose/Rate Route Frequency Ordered Stop   04/07/24 1000  cefTRIAXone  (ROCEPHIN ) 2 g in sodium chloride  0.9 % 100 mL IVPB        2 g 200 mL/hr over 30 Minutes Intravenous Every 24 hours 04/07/24 0914 04/10/24 1020   04/04/24 2015  Ampicillin -Sulbactam (UNASYN ) 3 g in sodium chloride  0.9 % 100 mL IVPB  Status:  Discontinued  3 g 200 mL/hr over 30 Minutes Intravenous Every 6 hours 04/04/24 2010 04/07/24 0913   04/04/24 1745  cefTRIAXone  (ROCEPHIN ) 1 g in sodium chloride  0.9 % 100 mL IVPB  Status:  Discontinued        1 g 200 mL/hr over 30 Minutes Intravenous  Once 04/04/24 1733 04/04/24 1739   04/04/24 1745  ciprofloxacin  (CIPRO ) IVPB 400 mg  Status:  Discontinued        400 mg 200 mL/hr over 60 Minutes Intravenous  Once 04/04/24 1739 04/04/24 1759        Medications  Scheduled Meds:  apixaban   5 mg Oral BID   atorvastatin   80 mg Oral Daily   busPIRone   5 mg Oral TID   dapagliflozin  propanediol  10 mg Oral Daily   dronedarone   400 mg Oral BID WC   escitalopram   5 mg Oral Daily   gabapentin   200 mg Oral TID   Gerhardt's butt cream   Topical QID   lidocaine   2 patch Transdermal Q24H   polycarbophil  625 mg Oral Daily   potassium chloride   20 mEq Oral Daily   sodium chloride  flush  3 mL Intravenous Q12H   Continuous Infusions: PRN Meds:.acetaminophen , camphor-menthol ,  guaiFENesin -dextromethorphan , HYDROcodone -acetaminophen , loperamide , methocarbamol , ondansetron  (ZOFRAN ) IV, mouth rinse, polyethylene glycol, sodium chloride  flush    Subjective:   Kristine Montgomery was seen and examined today.2 loose BM today.  No chest pain or sob.   Objective:   Vitals:   04/15/24 2004 04/16/24 0500 04/16/24 0607 04/16/24 1313  BP: 127/80  127/86 (!) 154/82  Pulse: 73  77 76  Resp: 16  18 18   Temp:   (!) 97.5 F (36.4 C) 98.8 F (37.1 C)  TempSrc:   Oral Oral  SpO2: 94%  96% 97%  Weight:  121.2 kg    Height:        Intake/Output Summary (Last 24 hours) at 04/16/2024 1634 Last data filed at 04/16/2024 0915 Gross per 24 hour  Intake 3 ml  Output --  Net 3 ml   Filed Weights   04/14/24 0502 04/15/24 0500 04/16/24 0500  Weight: 119.8 kg 121.2 kg 121.2 kg     Exam General exam: Appears calm and comfortable  Respiratory system: Clear to auscultation. Respiratory effort normal. Cardiovascular system: S1 & S2 heard, RRR. Gastrointestinal system: Abdomen is nondistended, soft and nontender.  Central nervous system: Alert and oriented. Extremities:  pedal edema present.  Skin: No rashes,  Psychiatry: Mood & affect appropriate.       Data Reviewed:  I have personally reviewed following labs and imaging studies   CBC Lab Results  Component Value Date   WBC 7.8 04/14/2024   RBC 3.20 (L) 04/14/2024   HGB 8.0 (L) 04/14/2024   HCT 27.9 (L) 04/14/2024   MCV 87.2 04/14/2024   MCH 25.0 (L) 04/14/2024   PLT 295 04/14/2024   MCHC 28.7 (L) 04/14/2024   RDW 20.0 (H) 04/14/2024   LYMPHSABS 2.4 04/14/2024   MONOABS 0.7 04/14/2024   EOSABS 0.1 04/14/2024   BASOSABS 0.0 04/14/2024     Last metabolic panel Lab Results  Component Value Date   NA 138 04/14/2024   K 3.5 04/14/2024   CL 102 04/14/2024   CO2 34 (H) 04/14/2024   BUN 8 04/14/2024   CREATININE 0.41 (L) 04/14/2024   GLUCOSE 72 04/14/2024   GFRNONAA >60 04/14/2024   GFRAA >60 10/28/2019    CALCIUM  7.5 (L) 04/14/2024   PHOS 4.2 04/06/2024  PROT 6.5 04/04/2024   ALBUMIN  2.0 (L) 04/04/2024   BILITOT 0.5 04/04/2024   ALKPHOS 101 04/04/2024   AST 37 04/04/2024   ALT 10 04/04/2024   ANIONGAP 2 (L) 04/14/2024    CBG (last 3)  No results for input(s): GLUCAP in the last 72 hours.    Coagulation Profile: No results for input(s): INR, PROTIME in the last 168 hours.   Radiology Studies: No results found.     Elgie Butter M.D. [facility] 04/16/2024, 4:34 PM  Available via Epic secure chat 7am-7pm After 7 pm, please refer to night coverage provider listed on amion.

## 2024-04-16 NOTE — TOC Progression Note (Signed)
 Transition of Care [name]    Patient Details  Name: Kristine Montgomery MRN: [id number] Date of Birth: 12-26-1965  Transition of [facility]) CM/SW Contact  [name] [name], [name] Phone Number: 04/16/2024, 9:26 AM  Clinical Narrative:     The pt's insurance authorization for SNF placement was denied because the peer-to-peer information was not provided to the MD within the required timeframe. The peer-to-peer was issued on 11/27 at 8:31 a.m. with a deadline of 11/28 at 12:00 p.m.  CSW discussed the denial with the pt and provided information regarding the Fast Track appeal process. The pt stated she would like to initiate the appeal. ICM to follow.  Expected Discharge Plan: Skilled Nursing Facility Barriers to Discharge: Continued Medical Work up, English As A Second Language Teacher, SNF Pending bed offer               Expected Discharge Plan and Services In-house Referral: NA Discharge Planning Services: CM Consult Post Acute Care Choice: Durable Medical Equipment, Home Health Living arrangements for the past 2 months: Apartment                 DME Arranged: N/A DME Agency: NA       HH Arranged: NA HH Agency: NA         Social Drivers of Health (SDOH) Interventions SDOH Screenings   Food Insecurity: No Food Insecurity (04/04/2024)  Housing: Low Risk  (04/04/2024)  Transportation Needs: No Transportation Needs (04/04/2024)  Utilities: Not At Risk (04/04/2024)  Alcohol Screen: Low Risk  (04/09/2020)  Depression (PHQ2-9): Low Risk  (04/09/2020)  Financial Resource Strain: Low Risk  (07/30/2020)  Physical Activity: Insufficiently Active (04/09/2020)  Social Connections: Socially Isolated (07/30/2020)  Stress: No Stress Concern Present (04/09/2020)  Tobacco Use: Medium Risk (04/04/2024)    Readmission Risk Interventions    04/06/2024    3:16 PM 11/09/2023   11:50 AM 11/06/2023    6:27 PM  Readmission Risk Prevention Plan  Transportation Screening  Complete Complete Complete  PCP or Specialist Appt within 5-7 Days Complete  Complete  Home Care Screening Complete Complete Complete  Medication Review (RN CM) Complete Complete Complete

## 2024-04-17 LAB — CBC WITH DIFFERENTIAL/PLATELET
Abs Immature Granulocytes: 0.09 K/uL — ABNORMAL HIGH (ref 0.00–0.07)
Basophils Absolute: 0.1 K/uL (ref 0.0–0.1)
Basophils Relative: 1 %
Eosinophils Absolute: 0.1 K/uL (ref 0.0–0.5)
Eosinophils Relative: 1 %
HCT: 29.5 % — ABNORMAL LOW (ref 36.0–46.0)
Hemoglobin: 8.6 g/dL — ABNORMAL LOW (ref 12.0–15.0)
Immature Granulocytes: 1 %
Lymphocytes Relative: 26 %
Lymphs Abs: 2.4 K/uL (ref 0.7–4.0)
MCH: 25.5 pg — ABNORMAL LOW (ref 26.0–34.0)
MCHC: 29.2 g/dL — ABNORMAL LOW (ref 30.0–36.0)
MCV: 87.5 fL (ref 80.0–100.0)
Monocytes Absolute: 0.9 K/uL (ref 0.1–1.0)
Monocytes Relative: 10 %
Neutro Abs: 5.7 K/uL (ref 1.7–7.7)
Neutrophils Relative %: 61 %
Platelets: 346 K/uL (ref 150–400)
RBC: 3.37 MIL/uL — ABNORMAL LOW (ref 3.87–5.11)
RDW: 20.7 % — ABNORMAL HIGH (ref 11.5–15.5)
WBC: 9.2 K/uL (ref 4.0–10.5)
nRBC: 0 % (ref 0.0–0.2)

## 2024-04-17 LAB — BASIC METABOLIC PANEL WITH GFR
Anion gap: 3 — ABNORMAL LOW (ref 5–15)
BUN: 12 mg/dL (ref 6–20)
CO2: 34 mmol/L — ABNORMAL HIGH (ref 22–32)
Calcium: 7.5 mg/dL — ABNORMAL LOW (ref 8.9–10.3)
Chloride: 103 mmol/L (ref 98–111)
Creatinine, Ser: 0.49 mg/dL (ref 0.44–1.00)
GFR, Estimated: >60 mL/min (ref >60)
Glucose, Bld: 71 mg/dL (ref 70–99)
Potassium: 3.6 mmol/L (ref 3.5–5.1)
Sodium: 141 mmol/L (ref 135–145)

## 2024-04-17 NOTE — TOC Progression Note (Signed)
 Transition of Care [facility]) - Progression Note    Patient Details  Name: Kristine Montgomery MRN: [id number] Date of Birth: Jan 27, 1966  Transition of Care [facility]) CM/SW Contact  Heather DELENA Saltness, [name] Phone Number: 04/17/2024, 11:08 AM  Clinical Narrative:    Per [place] representative, [employer] requesting PT/OT notes, MD notes, and progress notes from last 72 hours to review for appeal process. CSW sent requested documentation to [facility] via fax at [phone]. TOC will continue to follow.   Expected Discharge Plan: Skilled Nursing Facility Barriers to Discharge: Continued Medical Work up, English As A Second Language Teacher, SNF Pending bed offer  Expected Discharge Plan and Services In-house Referral: NA Discharge Planning Services: CM Consult Post Acute Care Choice: Durable Medical Equipment, Home Health Living arrangements for the past 2 months: Apartment                 DME Arranged: N/A DME Agency: NA       HH Arranged: NA HH Agency: NA         Social Drivers of Health (SDOH) Interventions SDOH Screenings   Food Insecurity: No Food Insecurity (04/04/2024)  Housing: Low Risk  (04/04/2024)  Transportation Needs: No Transportation Needs (04/04/2024)  Utilities: Not At Risk (04/04/2024)  Alcohol Screen: Low Risk  (04/09/2020)  Depression (PHQ2-9): Low Risk  (04/09/2020)  Financial Resource Strain: Low Risk  (07/30/2020)  Physical Activity: Insufficiently Active (04/09/2020)  Social Connections: Socially Isolated (07/30/2020)  Stress: No Stress Concern Present (04/09/2020)  Tobacco Use: Medium Risk (04/04/2024)    Readmission Risk Interventions    04/06/2024    3:16 PM 11/09/2023   11:50 AM 11/06/2023    6:27 PM  Readmission Risk Prevention Plan  Transportation Screening Complete Complete Complete  PCP or Specialist Appt within 5-7 Days Complete  Complete  Home Care Screening Complete Complete Complete  Medication Review (RN CM) Complete Complete Complete    Signed: Heather Saltness, MSW, [name] Clinical Social Worker Inpatient Care Management 04/17/2024 11:11 AM

## 2024-04-17 NOTE — Progress Notes (Signed)
 [facility]                                                                               Kristine Montgomery, is a 58 y.o. female, DOB - [birth year], FMW:[id number] Admit date - 04/04/2024    Outpatient Primary MD for the patient is [name], [name], NP  LOS - 13  days    Brief summary   58yo with h/o CAD s/p DES, HFrEF (improved from 30% to 55-60%), PAF, HLD, asthma, RA, and morbid obesity who presented on 11/19 with Vtach to decompensated vfib arrest requiring CPR and defibrillation x 1 minute with ROSC.  This was attributed to severe electrolyte disturbance with K+ 2.1 and Ca++ 7.1.  Electrolytes were repleted and she was admitted to [facility] ICU (from [facility]) for ongoing stabilization.  Cardiology consulted, and have signed off.  Patient is medically stable for discharge and she waiting for insurance auth. Unfortunately insurance has denied her SNF, she initiated a fast appeal.   No new events overnight.    Assessment & Plan    Assessment and Plan: * Cardiac arrest with ventricular fibrillation (HCC) In the setting of hypokalemia, hypocalcemia, and home Multaq  for Afib Troponins in ED 27>22 Cardiology consulted and has resumed Multaq ; they are now signed off Aggressive electrolyte repletion, now WNL Continue home Eliquis . Patient denies any new complaints.   Coronary artery disease involving native coronary artery of native heart with unstable angina pectoris (HCC) Most recent Echo 08/09/2022 EF 55-60%, grade I diastolic dysfunction, and normal RV BNP in ED 3741 Repeat Echo unremarkable with small pericardial effusion Restart lasix  at time of dc Continue home statin  Repeat chest x-ray showed pulmonary congestion no pneumothorax Repeat EKG did not show anything acute No documented hypoxia appreciated but she was started on oxygen  via high flow nasal cannula and will wean as tolerated Resume Farxiga  Has chronic LE edema, likely unrelated to current presentation; will add TED  hose. She denies any chest pain or sob.  No new events overnight.   Tobacco abuse Quit smoking 4 years ago  Morbid obesity (HCC) Body mass index is 50.36 kg/m.[name]  Weight loss should be encouraged Outpatient PCP/bariatric medicine f/u encouraged Significantly low or high BMI is associated with higher medical risk including morbidity and mortality   Chronic diarrhea Reports intermittent diarrhea since at least July, improved with imodium .  Recommend outpatient follow-up with gastroenterologist. Labs are unremarkable.   Adjustment disorder with mixed anxiety and depressed mood Reports h/o mood disturbance at home Amplified by anxiety about cardiac arrest Added Lexapro  + buspirone  (standing for now, can change to prn once Lexapro  takes effect); she is noticing some improvement and feels less tense.  Currently denies any depression. She is worried about her SNF. She lives alone and cannot take care of herself. She is requiring total assistance for rolling and scooting. She will need rehab in acute setting.   Normocytic anemia Anemia of acute illness superimposed on anemia of chronic disease Patient's hemoglobin at baseline around 10 has dropped to 8.4 to 8 to 8.6 today. Continue to monitor and transfuse to keep it greater than 7.   Multiple rib fractures Likely related to  CPR CTA with L anterior fractures of ribs 3-6, age indeterminate - not appreciated on xrays Continue lidocaine  patches and prn Norco for pain control PT/OT recommending SNF Awaiting for a bed at SNF. She denies any pain.   Pressure injury of skin Wound 04/04/24 2300 Pressure Injury Buttocks Bilateral Stage 2 -  Partial thickness loss of dermis presenting as a shallow open injury with a red, pink wound bed without slough. (Active)     Wound 04/04/24 2300 Pressure Injury Buttocks Bilateral Stage 1 -  Intact skin with non-blanchable redness of a localized area usually over a bony prominence. (Active)  Local wound  care  Chronic pain syndrome I have reviewed this patient in the [place] Controlled Substances Reporting System.  She is receiving medications from two providers but appears to be taking them as prescribed. She is at high risk of opioid misuse, diversion, or overdose.  Continue gabapentin , Norco.  No new complaints.   Acute on chronic combined systolic and diastolic CHF (congestive heart failure) (HCC) Most recent Echo 08/09/2022 EF 55-60%, grade I diastolic dysfunction, and normal RV BNP in ED 3741 Repeat Echo unremarkable with small pericardial effusion Restart lasix  at time of dc Continue home statin  Repeat chest x-ray showed pulmonary congestion no pneumothorax Repeat EKG did not show anything acute No documented hypoxia appreciated but she was started on oxygen  via high flow nasal cannula and will wean as tolerated Resume Farxiga  Has chronic LE edema, likely unrelated to current presentation.  Hypokalemia Severe presenting hypokalemia from GI losses, which led to cardiac arrest GI losses have improved, no longer having n/v/d Replaced.  Potassium has been stable the last few days.   Paroxysmal atrial fibrillation (HCC) Rate controlled with multaq  Continue Eliquis   Mixed hyperlipidemia Continue atorvastatin   Urinary tract infection without hematuria N/V/D prior to presentation UA in ED with large leukocytes, negative nitries, 21-50 WBC, and few bacteria Denies dysuria or pelvic pain Also with concern for multifocal PNA on chest CT Urine culture with >100k colonies E coli with resistance to Ampicillin , Unasyn , and Cipro   Completed the course of antibiotics.  Leukocytosis resolved.     Unfortunately patient's insurance denies SNF , fast appeal initiated by the patient.  Toc on board.    RN Pressure Injury Documentation: Wound 04/04/24 2300 Pressure Injury Buttocks Bilateral Stage 2 -  Partial thickness loss of dermis presenting as a shallow open injury with a red, pink  wound bed without slough. (Active)     Wound 04/04/24 2300 Pressure Injury Buttocks Bilateral Stage 1 -  Intact skin with non-blanchable redness of a localized area usually over a bony prominence. (Active)   Local wound care.   Estimated body mass index is 49.24 kg/m as calculated from the following:   Height as of this encounter: 5' 1 (1.549 m).   Weight as of this encounter: 118.2 kg.  Code Status: Full code DVT Prophylaxis:  Place TED hose Start: 04/07/24 1440 SCDs Start: 04/04/24 2303 apixaban  (ELIQUIS ) tablet 5 mg   Level of Care: Level of care: Telemetry Family Communication: None at bedside  Disposition Plan:     Remains inpatient appropriate: Patient is medically stable for discharge waiting for SNF bed   Antimicrobials:   Anti-infectives (From admission, onward)    Start     Dose/Rate Route Frequency Ordered Stop   04/07/24 1000  cefTRIAXone  (ROCEPHIN ) 2 g in sodium chloride  0.9 % 100 mL IVPB        2 g 200 mL/hr over 30  Minutes Intravenous Every 24 hours 04/07/24 0914 04/10/24 1020   04/04/24 2015  Ampicillin -Sulbactam (UNASYN ) 3 g in sodium chloride  0.9 % 100 mL IVPB  Status:  Discontinued        3 g 200 mL/hr over 30 Minutes Intravenous Every 6 hours 04/04/24 2010 04/07/24 0913   04/04/24 1745  cefTRIAXone  (ROCEPHIN ) 1 g in sodium chloride  0.9 % 100 mL IVPB  Status:  Discontinued        1 g 200 mL/hr over 30 Minutes Intravenous  Once 04/04/24 1733 04/04/24 1739   04/04/24 1745  ciprofloxacin  (CIPRO ) IVPB 400 mg  Status:  Discontinued        400 mg 200 mL/hr over 60 Minutes Intravenous  Once 04/04/24 1739 04/04/24 1759        Medications  Scheduled Meds:  apixaban   5 mg Oral BID   atorvastatin   80 mg Oral Daily   busPIRone   5 mg Oral TID   dapagliflozin  propanediol  10 mg Oral Daily   dronedarone   400 mg Oral BID WC   escitalopram   5 mg Oral Daily   gabapentin   200 mg Oral TID   Gerhardt's butt cream   Topical QID   lidocaine   2 patch Transdermal  Q24H   potassium chloride   20 mEq Oral Daily   sodium chloride  flush  3 mL Intravenous Q12H   Continuous Infusions: PRN Meds:.acetaminophen , camphor-menthol , guaiFENesin -dextromethorphan , HYDROcodone -acetaminophen , loperamide , methocarbamol , ondansetron  (ZOFRAN ) IV, mouth rinse, sodium chloride  flush    Subjective:   Betrice Wanat was seen and examined today. No new complaints.   Objective:   Vitals:   04/16/24 2114 04/17/24 0500 04/17/24 0555 04/17/24 1354  BP: 131/70  123/76 118/75  Pulse: 80  66 71  Resp: 18  20 18   Temp: 97.6 F (36.4 C)  98 F (36.7 C) 97.9 F (36.6 C)  TempSrc: Oral   Oral  SpO2: 94%  98% 98%  Weight:  118.2 kg    Height:        Intake/Output Summary (Last 24 hours) at 04/17/2024 1518 Last data filed at 04/17/2024 0845 Gross per 24 hour  Intake 123 ml  Output 700 ml  Net -577 ml   Filed Weights   04/15/24 0500 04/16/24 0500 04/17/24 0500  Weight: 121.2 kg 121.2 kg 118.2 kg     Exam General exam: Appears calm and comfortable  Respiratory system: air entry fair. On 2 to 3 lit of [place] oxygen   Cardiovascular system: S1 & S2 heard, RRR. No JVD, Gastrointestinal system: Abdomen is nondistended, soft and nontender.  Central nervous system: Alert and oriented.  Extremities: pedal edema present. Skin: No rashes,  Psychiatry: Mood & affect appropriate.        Data Reviewed:  I have personally reviewed following labs and imaging studies   CBC Lab Results  Component Value Date   WBC 9.2 04/17/2024   RBC 3.37 (L) 04/17/2024   HGB 8.6 (L) 04/17/2024   HCT 29.5 (L) 04/17/2024   MCV 87.5 04/17/2024   MCH 25.5 (L) 04/17/2024   PLT 346 04/17/2024   MCHC 29.2 (L) 04/17/2024   RDW 20.7 (H) 04/17/2024   LYMPHSABS 2.4 04/17/2024   MONOABS 0.9 04/17/2024   EOSABS 0.1 04/17/2024   BASOSABS 0.1 04/17/2024     Last metabolic panel Lab Results  Component Value Date   NA 141 04/17/2024   K 3.6 04/17/2024   CL 103 04/17/2024   CO2 34 (H)  04/17/2024   BUN 12 04/17/2024  CREATININE 0.49 04/17/2024   GLUCOSE 71 04/17/2024   GFRNONAA >60 04/17/2024   GFRAA >60 10/28/2019   CALCIUM  7.5 (L) 04/17/2024   PHOS 4.2 04/06/2024   PROT 6.5 04/04/2024   ALBUMIN  2.0 (L) 04/04/2024   BILITOT 0.5 04/04/2024   ALKPHOS 101 04/04/2024   AST 37 04/04/2024   ALT 10 04/04/2024   ANIONGAP 3 (L) 04/17/2024    CBG (last 3)  No results for input(s): GLUCAP in the last 72 hours.    Coagulation Profile: No results for input(s): INR, PROTIME in the last 168 hours.   Radiology Studies: No results found.     Elgie Butter M.D. [facility] 04/17/2024, 3:18 PM  Available via Epic secure chat 7am-7pm After 7 pm, please refer to night coverage provider listed on amion.

## 2024-04-17 NOTE — Progress Notes (Addendum)
 Physical Therapy Treatment Patient Details Name: Kristine Montgomery MRN: [id number] DOB: 1965/08/25 Today's Date: 04/17/2024   History of Present Illness 58 year old female who presents as transfer from [facility] ED post Vfib arrest in the setting of hypokalemia. Pt received CPR.  L anterior 3-6 rib fractures.  She initially presented to the Ed with 4 days of nausea, vomiting and diarrhea. with history of CAD with NSTEMI s/p DES prox-LAD and prox-Lcx (2022), HFimpEF (EF 30% by echo in 07/2020 improved to 55-60% in 2024), paroxysmal Afib, HLD, asthma, RA and morbid obesity    PT Comments  Pt agreeable to PT. Transitioned supien <>sit with +2 mod-max assist. Initially assist required to maintain static sitting however with time pt able to progress to sitting EOB with close supervision,  participate in trunk rotation, LE exercises and reaching tasks.  Pt reports she misses her cat, Turtle.  Per chart review and discussion with TOC pt has caregivers and family that assist at home. She uses a hoyer lift at baseline. If pt/family agreeable pt would benefit from d/c home with HHPT f/u   If plan is discharge home, recommend the following: Two people to help with walking and/or transfers;Assistance with cooking/housework;Two people to help with bathing/dressing/bathroom;Assist for transportation;Help with stairs or ramp for entrance;Direct supervision/assist for medications management   Can travel by private vehicle     No  Equipment Recommendations  None recommended by PT    Recommendations for Other Services       Precautions / Restrictions Precautions Precautions: Fall Precaution/Restrictions Comments: L 3-6 rib fractures 2* CPR (sternal awareness) monitor O2     Mobility  Bed Mobility Overal bed mobility: Needs Assistance Bed Mobility: Rolling, Supine to Sit, Sit to Supine Rolling: +2 for physical assistance, Max assist, Used rails   Supine to sit: Mod assist, Max assist, +2 for physical  assistance, +2 for safety/equipment, Used rails Sit to supine: Max assist, +2 for safety/equipment, +2 for physical assistance   General bed mobility comments: pt requires assist to progress LEs oiff bed, elevate trunk, bed pad used to fully pivot hips and scoot to EOB. pt with limited effort to self assist. pt able to control trunk descent with mod assist, assist required to lift bil LEs on to bed    Transfers                        Ambulation/Gait                   Stairs             Wheelchair Mobility     Tilt Bed    Modified Rankin (Stroke Patients Only)       Balance Overall balance assessment: Needs assistance Sitting-balance support: Feet supported, No upper extremity supported   Sitting balance - Comments: initially required assist 2* posterior lean, then fair. Pt sat EOB 10 minutes. Performed long arc quads, trunk rotation and reaching tasks.                                    Communication Communication Communication: No apparent difficulties  Cognition Arousal: Alert Behavior During Therapy: Flat affect, WFL for tasks assessed/performed   PT - Cognitive impairments: No apparent impairments  Following commands: Intact      Cueing Cueing Techniques: Verbal cues  Exercises General Exercises - Lower Extremity Ankle Circles/Pumps: AROM, Both, 5 reps Long Arc Quad: AROM, Both, 10 reps    General Comments        Pertinent Vitals/Pain Pain Assessment Pain Assessment: Faces Faces Pain Scale: Hurts little more Pain Location: chest Pain Descriptors / Indicators: Sore Pain Intervention(s): Limited activity within patient's tolerance, Monitored during session, Repositioned    Home Living                          Prior Function            PT Goals (current goals can now be found in the care plan section) Acute Rehab PT Goals Patient Stated Goal: likes to play Bingo PT  Goal Formulation: With patient Time For Goal Achievement: 04/20/24 Potential to Achieve Goals: Fair Progress towards PT goals: Progressing toward goals    Frequency    Min 2X/week      PT Plan      Co-evaluation              AM-PAC PT 6 Clicks Mobility   Outcome Measure  Help needed turning from your back to your side while in a flat bed without using bedrails?: Total Help needed moving from lying on your back to sitting on the side of a flat bed without using bedrails?: Total Help needed moving to and from a bed to a chair (including a wheelchair)?: Total Help needed standing up from a chair using your arms (e.g., wheelchair or bedside chair)?: Total Help needed to walk in hospital room?: Total Help needed climbing 3-5 steps with a railing? : Total 6 Click Score: 6    End of Session   Activity Tolerance: Patient tolerated treatment well Patient left: in bed;with bed alarm set;with call bell/phone within reach   PT Visit Diagnosis: Muscle weakness (generalized) (M62.81);Other abnormalities of gait and mobility (R26.89)     Time: 1450-1506 PT Time Calculation (min) (ACUTE ONLY): 16 min  Charges:    $Therapeutic Activity: 8-22 mins PT General Charges $$ ACUTE PT VISIT: 1 Visit                     Criag Wicklund, PT  Acute Rehab Dept [facility]) [phone]  04/17/2024    [facility] 04/17/2024, 3:41 PM

## 2024-04-17 NOTE — Plan of Care (Signed)
  Problem: Pain Managment: Goal: General experience of comfort will improve and/or be controlled Outcome: Progressing   Problem: Safety: Goal: Ability to remain free from injury will improve Outcome: Progressing   Problem: Skin Integrity: Goal: Risk for impaired skin integrity will decrease Outcome: Progressing

## 2024-04-18 ENCOUNTER — Encounter: Payer: Self-pay | Admitting: Cardiology

## 2024-04-18 ENCOUNTER — Ambulatory Visit: Attending: Cardiology | Admitting: Cardiology

## 2024-04-18 DIAGNOSIS — I469 Cardiac arrest, cause unspecified: Secondary | ICD-10-CM | POA: Diagnosis not present

## 2024-04-18 DIAGNOSIS — I4901 Ventricular fibrillation: Secondary | ICD-10-CM | POA: Diagnosis not present

## 2024-04-18 MED ORDER — BUSPIRONE HCL 5 MG PO TABS: 5.0000 mg | ORAL_TABLET | Freq: Three times a day (TID) | ORAL | Status: AC

## 2024-04-18 MED ORDER — HYDROCODONE-ACETAMINOPHEN 10-325 MG PO TABS: 1.0000 | ORAL_TABLET | Freq: Four times a day (QID) | ORAL | 0 refills | Status: AC | PRN

## 2024-04-18 MED ORDER — ESCITALOPRAM OXALATE 5 MG PO TABS: 5.0000 mg | ORAL_TABLET | Freq: Every day | ORAL | Status: AC

## 2024-04-18 MED ORDER — LIDOCAINE 5 % EX PTCH: 2.0000 | MEDICATED_PATCH | CUTANEOUS | Status: AC

## 2024-04-18 MED ORDER — METHOCARBAMOL 500 MG PO TABS: 500.0000 mg | ORAL_TABLET | Freq: Three times a day (TID) | ORAL | 0 refills | Status: AC | PRN

## 2024-04-18 MED ORDER — LORATADINE 10 MG PO TABS
10.0000 mg | ORAL_TABLET | Freq: Every day | ORAL | Status: DC
  Administered 2024-04-18: 10 mg via ORAL
  Filled 2024-04-18: qty 1

## 2024-04-18 NOTE — Progress Notes (Signed)
 Occupational Therapy Treatment Patient Details Name: Kristine Montgomery MRN: [id number] DOB: 1965/07/25 Today's Date: 04/18/2024   History of present illness 58 year old female who presents as transfer from [facility] ED post Vfib arrest in the setting of hypokalemia. Pt received CPR.  L anterior 3-6 rib fractures.  She initially presented to the Ed with 4 days of nausea, vomiting and diarrhea. with history of CAD with NSTEMI s/p DES prox-LAD and prox-Lcx (2022), HFimpEF (EF 30% by echo in 07/2020 improved to 55-60% in 2024), paroxysmal Afib, HLD, asthma, RA and morbid obesity   OT comments  Patient seen in order to progress with functional mobility and activity tolerance. OT and patient engaging in BLE exercises and BUE reaching to work on rolling in order to progress independence. Patient noted with increased difficulty when attempting to reach to L (likely rib pain) requiring increased assist for positioning. Further education provided with regard to incentive spirometer with good teachback noted. Discharge remains appropriate, OT will continue to follow.       If plan is discharge home, recommend the following:  Two people to help with walking and/or transfers;Two people to help with bathing/dressing/bathroom;Assistance with cooking/housework;Assistance with feeding;Assist for transportation;Help with stairs or ramp for entrance;Direct supervision/assist for medications management;Direct supervision/assist for financial management   Equipment Recommendations       Recommendations for Other Services      Precautions / Restrictions Precautions Precautions: Fall Recall of Precautions/Restrictions: Intact Precaution/Restrictions Comments: L 3-6 rib fractures 2* CPR (sternal awareness) monitor O2 Restrictions Weight Bearing Restrictions Per Provider Order: No       Mobility Bed Mobility Overal bed mobility: Needs Assistance Bed Mobility: Rolling Rolling: +2 for physical assistance, Max  assist, Used rails         General bed mobility comments: Rolling to L in order to place pillow underneath L hip    Transfers                   General transfer comment: deferred this session as patient is discharging     Balance                                           ADL either performed or assessed with clinical judgement   ADL Overall ADL's : Needs assistance/impaired                                     Functional mobility during ADLs: +2 for physical assistance;+2 for safety/equipment;Total assistance General ADL Comments: Patient seen in order to progress with functional mobility and activity tolerance. OT and patient engaging in BLE exercises and BUE reaching to work on rolling in order to progress independence. Patient noted with increased difficulty when attempting to reach to L (likely rib pain) requiring increased assist for positioning. Further education provided with regard to incentive spirometer with good teachback noted. Discharge remains appropriate, OT will continue to follow.    Extremity/Trunk Assessment              Vision       Restaurant Manager, [employer] Communication: No apparent difficulties   Cognition Arousal: Alert Behavior During Therapy: Flat affect, WFL for tasks assessed/performed Cognition: No apparent impairments  Following commands: Intact        Cueing   Cueing Techniques: Verbal cues  Exercises Other Exercises Other Exercises: Assisted leg lifts x10 each leg Other Exercises: Quad contractions x7 each leg Other Exercises: Functional reaching with RUE to bed rail x10    Shoulder Instructions       General Comments VSS on 4L O2    Pertinent Vitals/ Pain       Pain Assessment Pain Assessment: Faces Faces Pain Scale: Hurts little more Pain Location: chest Pain Descriptors / Indicators: Sore Pain  Intervention(s): Limited activity within patient's tolerance, Monitored during session, Repositioned  Home Living                                          Prior Functioning/Environment              Frequency  Min 2X/week        Progress Toward Goals  OT Goals(current goals can now be found in the care plan section)  Progress towards OT goals: Progressing toward goals  Acute Rehab OT Goals Patient Stated Goal: to get better OT Goal Formulation: With patient Time For Goal Achievement: 04/20/24 Potential to Achieve Goals: Fair  Plan      Co-evaluation                 AM-PAC OT 6 Clicks Daily Activity     Outcome Measure   Help from another person eating meals?: None Help from another person taking care of personal grooming?: A Little Help from another person toileting, which includes using toliet, bedpan, or urinal?: Total Help from another person bathing (including washing, rinsing, drying)?: A Lot Help from another person to put on and taking off regular upper body clothing?: A Little Help from another person to put on and taking off regular lower body clothing?: Total 6 Click Score: 14    End of Session Equipment Utilized During Treatment: Oxygen   OT Visit Diagnosis: Other abnormalities of gait and mobility (R26.89);Muscle weakness (generalized) (M62.81);Pain   Activity Tolerance Patient tolerated treatment well   Patient Left in bed;with call bell/phone within reach;with bed alarm set   Nurse Communication Mobility status        Time: 8660-8641 OT Time Calculation (min): 19 min  Charges: OT General Charges $OT Visit: 1 Visit OT Treatments $Therapeutic Exercise: 8-22 mins  Ronal Gift E. Gillian Kluever, OTR/L Acute Rehabilitation Services [phone]   Ronal Gift Salt 04/18/2024, 2:47 PM

## 2024-04-18 NOTE — Discharge Summary (Signed)
 Physician Discharge Summary  Kristine Montgomery FMW:[id number] DOB: Aug 03, 1965 DOA: 04/04/2024  PCP: Kristine Warren BRAVO, NP  Admit date: 04/04/2024 Discharge date: 04/18/2024  Admitted From: Home Disposition: SNF  Recommendations for Outpatient Follow-up:  Follow up with PCP in 1-2 weeks Please obtain BMP/CBC in one week your next doctors visit.  Started BuSpar , Lexapro .  Further adjustment as necessary Lidocaine  patches for her rib fracture along with Robaxin  as needed Pain medication and bowel regimen as needed  Discharge Condition: Stable CODE STATUS: Full code Diet recommendation: Heart healthy  Brief/Interim Summary: Brief Narrative:   58 year old with history of CAD status post drug-eluting stent, CHF with improved EF 60%, paroxysmal A-fib, HLD, asthma, RA, morbid obesity presented to the hospital on 11/19 for ventricular tachycardia which decompensated to ventricular fibrillation arrest requiring CPR and defibrillation for 1 minute with ROSC.  Lab work showed severe electrolyte abnormalities.  Eventually cardiology team was consulted and patient was admitted to [facility], ICU from [facility] for further stabilization.  PT eventually recommended SNF which was denied by insurance.  Patient's appeal was successful now awaiting placement.  If we are able to place it we will discharge her today.  Assessment & Plan:  Cardiac arrest with ventricular fibrillation (HCC) Severe hypokalemia Secondary to electrolyte abnormalities.   Coronary artery disease involving native coronary artery of native heart with unstable angina pectoris (HCC) Congestive heart failure with preserved EF 60% Most recent Echo 08/09/2022 EF 55-60%, grade I diastolic dysfunction, and normal RV Seen by cardiology, no further workup indicated at this time.   Tobacco abuse Quit smoking 4 years ago   Morbid obesity (HCC) Body mass index is 50.36 kg/m.[facility]  Follow-up outpatient with primary care physician   Chronic  diarrhea Reports intermittent diarrhea since at least July, improved with imodium .  Recommend outpatient follow-up with gastroenterologist. Labs are unremarkable.    Adjustment disorder with mixed anxiety and depressed mood Slowly improving.  Currently on BuSpar  and Lexapro .   Normocytic anemia Anemia of acute illness superimposed on anemia of chronic disease Hemoglobin stable around 8.5     Multiple rib fractures Likely related to CPR CTA with L anterior fractures of ribs 3-6, age indeterminate - not appreciated on xrays Continue lidocaine  patches and prn Norco for pain control PT/OT recommending SNF Awaiting for a bed at SNF. She denies any pain.    Pressure injury of skin Wound 04/04/24 2300 Pressure Injury Buttocks Bilateral Stage 2 -  Partial thickness loss of dermis presenting as a shallow open injury with a red, pink wound bed without slough. (Active)     Wound 04/04/24 2300 Pressure Injury Buttocks Bilateral Stage 1 -  Intact skin with non-blanchable redness of a localized area usually over a bony prominence. (Active)  Local wound care   Chronic pain syndrome I have reviewed this patient in the  Controlled Substances Reporting System.  She is receiving medications from two providers but appears to be taking them as prescribed. She is at high risk of opioid misuse, diversion, or overdose.  Continue gabapentin , Norco.  No new complaints.      Paroxysmal atrial fibrillation (HCC) Rate controlled with multaq  Continue Eliquis    Mixed hyperlipidemia Continue atorvastatin    Urinary tract infection without hematuria Present on admission.  Treated with multiple days of Cipro /Unasyn  followed by Rocephin , treatment end date 11/25.     Unfortunately patient's insurance denies SNF , fast appeal initiated by the patient.  Toc on board.    DVT prophylaxis: Place TED hose  Start: 04/07/24 1440 SCDs Start: 04/04/24 2303 apixaban  (ELIQUIS ) tablet 5 mg      Code Status: Full  Code Family Communication:   Status is: Inpatient SNF placement  Subjective: Seen at bedside no complaints   Examination:  General exam: Appears calm and comfortable  Respiratory system: Clear to auscultation. Respiratory effort normal. Cardiovascular system: S1 & S2 heard, RRR. No JVD, murmurs, rubs, gallops or clicks. No pedal edema. Gastrointestinal system: Abdomen is nondistended, soft and nontender. No organomegaly or masses felt. Normal bowel sounds heard. Central nervous system: Alert and oriented. No focal neurological deficits. Extremities: Symmetric 5 x 5 power. Skin: No rashes, lesions or ulcers Psychiatry: Judgement and insight appear normal. Mood & affect appropriate.    Discharge Diagnoses:  Principal Problem:   Cardiac arrest with ventricular fibrillation (HCC) Active Problems:   Coronary artery disease involving native coronary artery of native heart with unstable angina pectoris (HCC)   Morbid obesity (HCC)   Tobacco abuse   Urinary tract infection without hematuria   Mixed hyperlipidemia   Paroxysmal atrial fibrillation (HCC)   Hypokalemia   Acute on chronic combined systolic and diastolic CHF (congestive heart failure) (HCC)   Chronic pain syndrome   Pressure injury of skin   Acute hypoxic respiratory failure (HCC)   Multiple rib fractures   Normocytic anemia   Positive blood culture   Adjustment disorder with mixed anxiety and depressed mood   Chronic diarrhea      Discharge Exam: Vitals:   04/17/24 2029 04/18/24 0546  BP: 118/75 132/81  Pulse: 65 66  Resp: 18 18  Temp: 98.5 F (36.9 C) (!) 97.4 F (36.3 C)  SpO2: 97% 95%   Vitals:   04/17/24 1354 04/17/24 2029 04/18/24 0500 04/18/24 0546  BP: 118/75 118/75  132/81  Pulse: 71 65  66  Resp: 18 18  18   Temp: 97.9 F (36.6 C) 98.5 F (36.9 C)  (!) 97.4 F (36.3 C)  TempSrc: Oral Oral  Oral  SpO2: 98% 97%  95%  Weight:   117.9 kg   Height:          Discharge  Instructions   Allergies as of 04/18/2024   No Known Allergies      Medication List     TAKE these medications    atorvastatin  80 MG tablet Commonly known as: LIPITOR  Take 0.5 tablets (40 mg total) by mouth daily. What changed: how much to take   busPIRone  5 MG tablet Commonly known as: BUSPAR  Take 1 tablet (5 mg total) by mouth 3 (three) times daily.   dronedarone  400 MG tablet Commonly known as: MULTAQ  Take 1 tablet (400 mg total) by mouth 2 (two) times daily with a meal.   Eliquis  5 MG Tabs tablet Generic drug: apixaban  TAKE ONE TABLET BY MOUTH 2 TIMES A DAY   escitalopram  5 MG tablet Commonly known as: LEXAPRO  Take 1 tablet (5 mg total) by mouth daily.   famotidine  40 MG tablet Commonly known as: PEPCID  Take 1 tablet (40 mg total) by mouth every evening. Please keep scheduled appointment for future refills. Thank you.   Farxiga  10 MG Tabs tablet Generic drug: dapagliflozin  propanediol Take 1 tablet (10 mg total) by mouth daily.   furosemide  20 MG tablet Commonly known as: LASIX  Take 20 mg by mouth daily.   gabapentin  100 MG capsule Commonly known as: Neurontin  Take 2 capsules (200 mg total) by mouth 3 (three) times daily.   HYDROcodone -acetaminophen  10-325 MG tablet Commonly known as:  NORCO Take 1 tablet by mouth every 6 (six) hours as needed for severe pain (pain score 7-10).   lidocaine  5 % Commonly known as: LIDODERM  Place 2 patches onto the skin daily. Remove & Discard patch within 12 hours or as directed by MD   methocarbamol  500 MG tablet Commonly known as: ROBAXIN  Take 1 tablet (500 mg total) by mouth every 8 (eight) hours as needed for muscle spasms.   nitroGLYCERIN  0.4 MG SL tablet Commonly known as: NITROSTAT  Place 0.4 mg under the tongue every 5 (five) minutes as needed for chest pain.   potassium chloride  10 MEQ tablet Commonly known as: KLOR-CON  Take 10 mEq by mouth daily.   Vitamin D  (Ergocalciferol ) 1.25 MG (50000 UNIT) Caps  capsule Commonly known as: DRISDOL Take 50,000 Units by mouth every Saturday.        Contact information for follow-up providers     Kristine Currier L, PA-C Follow up.   Specialty: Cardiology Why: Hospital follow-up with Cardiology scheduled for 04/18/2024 at 2:30pm. Please arrive 15 minutes early for check-in. If this date/ time does not work for you, please call our office to reschedule. Contact information: [address] [place] [address] [phone]         [name], [name], NP Follow up in 1 week(s).   Specialty: Nurse Practitioner Contact information: [address] [place] [address] [phone]              Contact information for after-discharge care     Destination     [facility] .   Service: Skilled Nursing Contact information: [address] [place]  [address] [phone]                    No Known Allergies  You were cared for by a hospitalist during your hospital stay. If you have any questions about your discharge medications or the care you received while you were in the hospital after you are discharged, you can call the unit and asked to speak with the hospitalist on call if the hospitalist that took care of you is not available. Once you are discharged, your primary care physician will handle any further medical issues. Please note that no refills for any discharge medications will be authorized once you are discharged, as it is imperative that you return to your primary care physician (or establish a relationship with a primary care physician if you do not have one) for your aftercare needs so that they can reassess your need for medications and monitor your lab values.  You were cared for by a hospitalist during your hospital stay. If you have any questions about your discharge medications or the care you received while you were in the hospital after you are discharged, you can call the  unit and asked to speak with the hospitalist on call if the hospitalist that took care of you is not available. Once you are discharged, your primary care physician will handle any further medical issues. Please note that NO REFILLS for any discharge medications will be authorized once you are discharged, as it is imperative that you return to your primary care physician (or establish a relationship with a primary care physician if you do not have one) for your aftercare needs so that they can reassess your need for medications and monitor your lab values.  Please request your Prim.MD to go over all Hospital Tests and Procedure/Radiological results at the follow up,  please get all Hospital records sent to your Prim MD by signing hospital release before you go home.  Get CBC, CMP, 2 view Chest X ray checked  by Primary MD during your next visit or SNF MD in 5-7 days ( we routinely change or add medications that can affect your baseline labs and fluid status, therefore we recommend that you get the mentioned basic workup next visit with your PCP, your PCP may decide not to get them or add new tests based on their clinical decision)  On your next visit with your primary care physician please Get Medicines reviewed and adjusted.  If you experience worsening of your admission symptoms, develop shortness of breath, life threatening emergency, suicidal or homicidal thoughts you must seek medical attention immediately by calling 911 or calling your MD immediately  if symptoms less severe.  You Must read complete instructions/literature along with all the possible adverse reactions/side effects for all the Medicines you take and that have been prescribed to you. Take any new Medicines after you have completely understood and accpet all the possible adverse reactions/side effects.   Do not drive, operate heavy machinery, perform activities at heights, swimming or participation in water activities or provide baby  sitting services if your were admitted for syncope or siezures until you have seen by Primary MD or a Neurologist and advised to do so again.  Do not drive when taking Pain medications.   Procedures/Studies: ECHOCARDIOGRAM COMPLETE Result Date: 04/05/2024    ECHOCARDIOGRAM REPORT   Patient Name:   Kristine Montgomery Date of Exam: 04/05/2024 Medical Rec #:  [id number]      Height:       61.0 in Accession #:    [id number]     Weight:       252.4 lb Date of Birth:  11-Sep-1965      BSA:          2.085 m Patient Age:    58 years       BP:           105/57 mmHg Patient Gender: F              HR:           72 bpm. Exam Location:  Inpatient Procedure: 2D Echo and Intracardiac Opacification Agent (Both Spectral and Color            Flow Doppler were utilized during procedure). Indications:    Cardiac arrest  History:        Patient has prior history of Echocardiogram examinations. CAD                 and Previous Myocardial Infarction.  Sonographer:    Charmaine Gaskins Referring Phys: [id number] MARCELLINA RAYMOND DUB IMPRESSIONS  1. Left ventricular ejection fraction, by estimation, is 50 to 55%. The left ventricle has low normal function. The left ventricle has no regional wall motion abnormalities. Left ventricular diastolic parameters were normal.  2. Right ventricular systolic function is normal. The right ventricular size is normal. There is mildly elevated pulmonary artery systolic pressure. The estimated right ventricular systolic pressure is 37.1 mmHg.  3. Left atrial size was mildly dilated.  4. A small pericardial effusion is present. The pericardial effusion is posterior to the left ventricle.  5. The mitral valve is grossly normal. Mild mitral valve regurgitation. No evidence of mitral stenosis.  6. The aortic valve is tricuspid. Aortic valve regurgitation is not visualized. No aortic stenosis is present.  7. The inferior vena cava is normal in size with greater than 50% respiratory variability, suggesting right atrial  pressure of 3 mmHg. FINDINGS  Left Ventricle: Left ventricular ejection fraction, by estimation, is 50 to 55%. The left ventricle has low normal function. The left ventricle has no regional wall motion abnormalities. Definity  contrast agent was given IV to delineate the left ventricular endocardial borders. The left ventricular internal cavity size was normal in size. There is no left ventricular hypertrophy. Left ventricular diastolic parameters were normal. Right Ventricle: The right ventricular size is normal. No increase in right ventricular wall thickness. Right ventricular systolic function is normal. There is mildly elevated pulmonary artery systolic pressure. The tricuspid regurgitant velocity is 2.92  m/s, and with an assumed right atrial pressure of 3 mmHg, the estimated right ventricular systolic pressure is 37.1 mmHg. Left Atrium: Left atrial size was mildly dilated. Right Atrium: Right atrial size was normal in size. Pericardium: A small pericardial effusion is present. The pericardial effusion is posterior to the left ventricle. Presence of epicardial fat layer. Mitral Valve: The mitral valve is grossly normal. Mild mitral valve regurgitation. No evidence of mitral valve stenosis. Tricuspid Valve: The tricuspid valve is normal in structure. Tricuspid valve regurgitation is mild . No evidence of tricuspid stenosis. Aortic Valve: The aortic valve is tricuspid. Aortic valve regurgitation is not visualized. No aortic stenosis is present. Pulmonic Valve: The pulmonic valve was normal in structure. Pulmonic valve regurgitation is trivial. No evidence of pulmonic stenosis. Aorta: The aortic root is normal in size and structure. Venous: The inferior vena cava is normal in size with greater than 50% respiratory variability, suggesting right atrial pressure of 3 mmHg. IAS/Shunts: The interatrial septum was not well visualized.  LEFT VENTRICLE PLAX 2D LVIDd:         5.60 cm      Diastology LVIDs:         4.40 cm       LV e' medial:    7.07 cm/s LV PW:         0.90 cm      LV E/e' medial:  19.4 LV IVS:        0.80 cm      LV e' lateral:   8.59 cm/s LVOT diam:     2.00 cm      LV E/e' lateral: 15.9 LV SV:         94 LV SV Index:   45 LVOT Area:     3.14 cm  LV Volumes (MOD) LV vol d, MOD A2C: 168.0 ml LV vol d, MOD A4C: 163.0 ml LV vol s, MOD A2C: 84.3 ml LV vol s, MOD A4C: 66.0 ml LV SV MOD A2C:     83.7 ml LV SV MOD A4C:     163.0 ml LV SV MOD BP:      91.3 ml RIGHT VENTRICLE RV Basal diam:  2.90 cm RV Mid diam:    2.50 cm RV S prime:     12.10 cm/s LEFT ATRIUM             Index        RIGHT ATRIUM           Index LA diam:        4.10 cm 1.97 cm/m   RA Area:     22.50 cm LA Vol (A2C):   78.0 ml 37.41 ml/m  RA Volume:   67.50 ml  32.37 ml/m LA Vol (A4C):  73.7 ml 35.34 ml/m LA Biplane Vol: 77.0 ml 36.93 ml/m  AORTIC VALVE LVOT Vmax:   129.09 cm/s LVOT Vmean:  80.564 cm/s LVOT VTI:    0.298 m  AORTA Ao Root diam: 3.00 cm Ao Asc diam:  3.60 cm MITRAL VALVE                TRICUSPID VALVE MV Area (PHT): 4.00 cm     TR Peak grad:   34.1 mmHg MV E velocity: 137.00 cm/s  TR Vmax:        292.00 cm/s MV A velocity: 103.00 cm/s MV E/A ratio:  1.33         SHUNTS                             Systemic VTI:  0.30 m                             Systemic Diam: 2.00 cm Soyla Merck MD Electronically signed by Soyla Merck MD Signature Date/Time: 04/05/2024/10:04:02 AM    Final    CT Angio Chest Pulmonary Embolism (PE) W or WO Contrast Result Date: 04/05/2024 EXAM: CTA CHEST 04/05/2024 04:26:20 AM TECHNIQUE: CTA of the chest was performed after the administration of 100 mL of iohexol  (OMNIPAQUE ) 350 MG/ML injection. Multiplanar reformatted images are provided for review. MIP images are provided for review. Automated exposure control, iterative reconstruction, and/or weight based adjustment of the mA/kV was utilized to reduce the radiation dose to as low as reasonably achievable. COMPARISON: Portable chest x-ray 04/05/2024  04:26:20 AM reported separately. CT abdomen and pelvis 08/06/2016. CLINICAL HISTORY: 58 year old female. Pulmonary embolism (PE) suspected, low to intermediate probability, positive D-dimer. FINDINGS: AORTA: Thoracic aortic calcified atherosclerosis. No thoracic aortic dissection. No aneurysm. MEDIASTINUM: Adequate pulmonary artery contrast timing. Advanced calcified coronary artery atherosclerosis and/or stent series 5 image 127. Heart size is at the upper limits of normal. No pericardial effusion. Mild mediastinal lipomatosis. LYMPH NODES: No mediastinal, hilar or axillary lymphadenopathy. LUNGS AND PLEURA: Necklace artifact at the thoracic inlet. Mild respiratory motion. Atelectatic changes to the major airways which remain patent. Small bilateral pleural effusions, mostly affecting the lower lobes, with superimposed streaky and confluent bilateral lower lobe opacity which in part is enhancing consolidation. Similar dependent upper lobe enhancing consolidation (series 4 x 35). Streaky peribronchial opacity also in the right upper lobe apex. Similar streaky opacity in the lingula. Trace paraseptal emphysema in the anterior left upper lobe. Additional pulmonary mosaic attenuation which appears to be gas trapping in the left lung. No pneumothorax. No acute pulmonary embolism. UPPER ABDOMEN: Visible upper abdomen appears stable and negative. SOFT TISSUES AND BONES: Hyperostosis in the thoracic spine results in multilevel thoracic interbody ankylosis. Nondisplaced left anterior rib fractures at the 3rd through 6th rib levels are age indeterminate. Posterior right 8th and 9th rib fractures appear chronic. No acute soft tissue abnormality. IMPRESSION: 1. No acute pulmonary embolism identified. 2. Small bilateral pleural effusions with superimposed upper and lower lobe dependent and enhancing consolidation. Streaky peribronchial opacity and gas trapping elsewhere in the lungs. Favor a combination of atelectasis and  pneumonia. 3. Nondisplaced and age indeterminate left anterior rib fractures 3 through 6. Query recent trauma. Chronic appearing right posterior rib fractures. 4. Calcified coronary artery and aortic atherosclerosis. Electronically signed by: Helayne Hurst MD 04/05/2024 06:03 AM EST RP Workstation: [id number]   DG Chest Port 1  View Result Date: 04/05/2024 EXAM: 1 VIEW(S) XRAY OF THE CHEST 04/05/2024 12:41:00 AM COMPARISON: 04/04/2024 CLINICAL HISTORY: Cardiac arrest with ventricular fibrillation (HCC) FINDINGS: LINES, TUBES AND DEVICES: External pacer over left hemithorax. LUNGS AND PLEURA: Marked progression of interstitial and alveolar pulmonary infiltrate most in keeping with moderate-to-severe pulmonary edema. No pneumothorax. No pleural effusion. HEART AND MEDIASTINUM: Cardiomegaly. Calcified aorta. BONES AND SOFT TISSUES: No acute osseous abnormality. IMPRESSION: 1. Marked progression of interstitial and alveolar pulmonary infiltrate, most in keeping with moderate-to-severe pulmonary edema. 2. Cardiomegaly Electronically signed by: Dorethia Molt MD 04/05/2024 12:47 AM EST RP Workstation: [id number]   DG Chest Portable 1 View Result Date: 04/04/2024 CLINICAL DATA:  Shortness of breath EXAM: PORTABLE CHEST 1 VIEW COMPARISON:  Chest x-ray 11/05/2023 FINDINGS: The heart is enlarged. There is a band of atelectasis in the right upper lobe. The lungs are otherwise clear. There is no pleural effusion or pneumothorax. No acute fractures are seen. IMPRESSION: 1. Cardiomegaly. 2. Band of atelectasis in the right upper lobe. Electronically Signed   By: Greig Pique M.D.   On: 04/04/2024 17:23     The results of significant diagnostics from this hospitalization (including imaging, microbiology, ancillary and laboratory) are listed below for reference.     Microbiology: No results found for this or any previous visit (from the past 240 hours).   Labs: BNP (last 3 results) Recent Labs    11/02/23 1601  11/05/23 1304  BNP 49.0 91.0   Basic Metabolic Panel: Recent Labs  Lab 04/14/24 0312 04/17/24 0352  NA 138 141  K 3.5 3.6  CL 102 103  CO2 34* 34*  GLUCOSE 72 71  BUN 8 12  CREATININE 0.41* 0.49  CALCIUM  7.5* 7.5*   Liver Function Tests: No results for input(s): AST, ALT, ALKPHOS, BILITOT, PROT, ALBUMIN  in the last 168 hours. No results for input(s): LIPASE, AMYLASE in the last 168 hours. No results for input(s): AMMONIA in the last 168 hours. CBC: Recent Labs  Lab 04/14/24 0312 04/17/24 0352  WBC 7.8 9.2  NEUTROABS 4.4 5.7  HGB 8.0* 8.6*  HCT 27.9* 29.5*  MCV 87.2 87.5  PLT 295 346   Cardiac Enzymes: No results for input(s): CKTOTAL, CKMB, CKMBINDEX, TROPONINI in the last 168 hours. BNP: Invalid input(s): POCBNP CBG: No results for input(s): GLUCAP in the last 168 hours. D-Dimer No results for input(s): DDIMER in the last 72 hours. Hgb A1c No results for input(s): HGBA1C in the last 72 hours. Lipid Profile No results for input(s): CHOL, HDL, LDLCALC, TRIG, CHOLHDL, LDLDIRECT in the last 72 hours. Thyroid  function studies No results for input(s): TSH, T4TOTAL, T3FREE, THYROIDAB in the last 72 hours.  Invalid input(s): FREET3 Anemia work up No results for input(s): VITAMINB12, FOLATE, FERRITIN, TIBC, IRON, RETICCTPCT in the last 72 hours. Urinalysis    Component Value Date/Time   COLORURINE AMBER (A) 04/04/2024 1640   APPEARANCEUR HAZY (A) 04/04/2024 1640   APPEARANCEUR Clear 02/27/2020 1530   LABSPEC 1.012 04/04/2024 1640   PHURINE 5.0 04/04/2024 1640   GLUCOSEU >=500 (A) 04/04/2024 1640   HGBUR NEGATIVE 04/04/2024 1640   BILIRUBINUR NEGATIVE 04/04/2024 1640   BILIRUBINUR Negative 02/27/2020 1530   KETONESUR NEGATIVE 04/04/2024 1640   PROTEINUR NEGATIVE 04/04/2024 1640   NITRITE NEGATIVE 04/04/2024 1640   LEUKOCYTESUR LARGE (A) 04/04/2024 1640   Sepsis Labs Recent Labs  Lab  04/14/24 0312 04/17/24 0352  WBC 7.8 9.2   Microbiology No results found for this or any previous visit (from the  past 240 hours).   Time coordinating discharge:  I have spent 35 minutes face to face with the patient and on the ward discussing the patients care, assessment, plan and disposition with other care givers. >50% of the time was devoted counseling the patient about the risks and benefits of treatment/Discharge disposition and coordinating care.   SIGNED:   Burgess JAYSON Dare, MD  [facility] 04/18/2024, 12:04 PM   If 7PM-7AM, please contact night-coverage

## 2024-04-18 NOTE — TOC Transition Note (Signed)
 Transition of Care [facility]) - Discharge Note  Patient Details  Name: Kristine Montgomery MRN: [id number] Date of Birth: 02-24-1966  Transition of Care [facility]) CM/SW Contact:  Duwaine GORMAN [name], [name] Phone Number: 04/18/2024, 1:51 PM  Clinical Narrative: Patient's SNF denial has been overturned, so patient is now approved for SNF. Plan auth ID # is: [id number]. Reference ID # is: [id number]. Patient is approved for 04/17/2024-04/20/2024. Patient will go to room 103 and the number for report is [phone]. Discharge summary, discharge orders, and SNF transfer report faxed to facility in hub. CSW updated patient and son regarding insurance approval. Medical necessity form done; PTAR scheduled. Discharge packet completed. RN updated. Care management signing off.  Final next level of care: Skilled Nursing Facility Barriers to Discharge: Barriers Resolved  Patient Goals and CMS Choice Patient states their goals for this hospitalization and ongoing recovery are:: Go to rehab CMS Medicare.gov Compare Post Acute Care list provided to:: Patient Choice offered to / list presented to : Patient [place] ownership interest in [facility].provided to:: Patient   Discharge Placement Existing PASRR number confirmed : 04/09/24          Patient chooses bed at: [facility] Patient to be transferred to facility by: PTAR Name of family member notified: [name] (son) Patient and family notified of of transfer: 04/18/24  Discharge Plan and Services Additional resources added to the After Visit Summary for   In-house Referral: NA Discharge Planning Services: CM Consult Post Acute Care Choice: Durable Medical Equipment, Home Health          DME Arranged: N/A DME Agency: NA HH Arranged: NA HH Agency: NA  Social Drivers of Health (SDOH) Interventions SDOH Screenings   Food Insecurity: No Food Insecurity (04/04/2024)  Housing: Low Risk  (04/04/2024)  Transportation Needs: No Transportation Needs  (04/04/2024)  Utilities: Not At Risk (04/04/2024)  Alcohol Screen: Low Risk  (04/09/2020)  Depression (PHQ2-9): Low Risk  (04/09/2020)  Financial Resource Strain: Low Risk  (07/30/2020)  Physical Activity: Insufficiently Active (04/09/2020)  Social Connections: Socially Isolated (07/30/2020)  Stress: No Stress Concern Present (04/09/2020)  Tobacco Use: Medium Risk (04/04/2024)   Readmission Risk Interventions    04/06/2024    3:16 PM 11/09/2023   11:50 AM 11/06/2023    6:27 PM  Readmission Risk Prevention Plan  Transportation Screening Complete Complete Complete  PCP or Specialist Appt within 5-7 Days Complete  Complete  Home Care Screening Complete Complete Complete  Medication Review (RN CM) Complete Complete Complete

## 2024-04-18 NOTE — Progress Notes (Signed)
 PROGRESS NOTE    Kristine Montgomery  FMW:[id number] DOB: July 15, 1965 DOA: 04/04/2024 PCP: Cathe Warren BRAVO, NP    Brief Narrative:   58 year old with history of CAD status post drug-eluting stent, CHF with improved EF 60%, paroxysmal A-fib, HLD, asthma, RA, morbid obesity presented to the hospital on 11/19 for ventricular tachycardia which decompensated to ventricular fibrillation arrest requiring CPR and defibrillation for 1 minute with ROSC.  Lab work showed severe electrolyte abnormalities.  Eventually cardiology team was consulted and patient was admitted to [facility] ICU from [facility] for further stabilization.  PT eventually recommended SNF which was denied by insurance.  Currently awaiting fast appeal  Assessment & Plan:  Cardiac arrest with ventricular fibrillation (HCC) Severe hypokalemia Secondary to electrolyte abnormalities.   Coronary artery disease involving native coronary artery of native heart with unstable angina pectoris (HCC) Congestive heart failure with preserved EF 60% Most recent Echo 08/09/2022 EF 55-60%, grade I diastolic dysfunction, and normal RV Seen by cardiology, no further workup indicated at this time.   Tobacco abuse Quit smoking 4 years ago   Morbid obesity (HCC) Body mass index is 50.36 kg/m.[name]  Follow-up outpatient with primary care physician   Chronic diarrhea Reports intermittent diarrhea since at least July, improved with imodium .  Recommend outpatient follow-up with gastroenterologist. Labs are unremarkable.    Adjustment disorder with mixed anxiety and depressed mood Slowly improving.  Currently on BuSpar  and Lexapro .   Normocytic anemia Anemia of acute illness superimposed on anemia of chronic disease Hemoglobin stable around 8.5     Multiple rib fractures Likely related to CPR CTA with L anterior fractures of ribs 3-6, age indeterminate - not appreciated on xrays Continue lidocaine  patches and prn Norco for pain control PT/OT  recommending SNF Awaiting for a bed at SNF. She denies any pain.    Pressure injury of skin Wound 04/04/24 2300 Pressure Injury Buttocks Bilateral Stage 2 -  Partial thickness loss of dermis presenting as a shallow open injury with a red, pink wound bed without slough. (Active)     Wound 04/04/24 2300 Pressure Injury Buttocks Bilateral Stage 1 -  Intact skin with non-blanchable redness of a localized area usually over a bony prominence. (Active)  Local wound care   Chronic pain syndrome I have reviewed this patient in the [place] Controlled Substances Reporting System.  She is receiving medications from two providers but appears to be taking them as prescribed. She is at high risk of opioid misuse, diversion, or overdose.  Continue gabapentin , Norco.  No new complaints.      Paroxysmal atrial fibrillation (HCC) Rate controlled with multaq  Continue Eliquis    Mixed hyperlipidemia Continue atorvastatin    Urinary tract infection without hematuria Present on admission.  Treated with multiple days of Cipro /Unasyn  followed by Rocephin , treatment end date 11/25.     Unfortunately patient's insurance denies SNF , fast appeal initiated by the patient.  Toc on board.    DVT prophylaxis: Place TED hose Start: 04/07/24 1440 SCDs Start: 04/04/24 2303 apixaban  (ELIQUIS ) tablet 5 mg      Code Status: Full Code Family Communication:   Status is: Inpatient Case has been appealed, awaiting safe disposition  Subjective: Seen at bedside no complaints   Examination:  General exam: Appears calm and comfortable  Respiratory system: Clear to auscultation. Respiratory effort normal. Cardiovascular system: S1 & S2 heard, RRR. No JVD, murmurs, rubs, gallops or clicks. No pedal edema. Gastrointestinal system: Abdomen is nondistended, soft and nontender. No organomegaly or masses  felt. Normal bowel sounds heard. Central nervous system: Alert and oriented. No focal neurological deficits. Extremities:  Symmetric 5 x 5 power. Skin: No rashes, lesions or ulcers Psychiatry: Judgement and insight appear normal. Mood & affect appropriate.            Wound 04/04/24 2300 Pressure Injury Buttocks Bilateral Stage 2 -  Partial thickness loss of dermis presenting as a shallow open injury with a red, pink wound bed without slough. (Active)     Wound 04/04/24 2300 Pressure Injury Buttocks Bilateral Stage 1 -  Intact skin with non-blanchable redness of a localized area usually over a bony prominence. (Active)     Diet Orders (From admission, onward)     Start     Ordered   04/13/24 1756  Diet Heart Room service appropriate? Yes; Fluid consistency: Thin  Diet effective now       Question Answer Comment  Room service appropriate? Yes   Fluid consistency: Thin      04/13/24 1756            Objective: Vitals:   04/17/24 1354 04/17/24 2029 04/18/24 0500 04/18/24 0546  BP: 118/75 118/75  132/81  Pulse: 71 65  66  Resp: 18 18  18   Temp: 97.9 F (36.6 C) 98.5 F (36.9 C)  (!) 97.4 F (36.3 C)  TempSrc: Oral Oral  Oral  SpO2: 98% 97%  95%  Weight:   117.9 kg   Height:        Intake/Output Summary (Last 24 hours) at 04/18/2024 1038 Last data filed at 04/18/2024 0600 Gross per 24 hour  Intake 240 ml  Output 550 ml  Net -310 ml   Filed Weights   04/16/24 0500 04/17/24 0500 04/18/24 0500  Weight: 121.2 kg 118.2 kg 117.9 kg    Scheduled Meds:  apixaban   5 mg Oral BID   atorvastatin   80 mg Oral Daily   busPIRone   5 mg Oral TID   dapagliflozin  propanediol  10 mg Oral Daily   dronedarone   400 mg Oral BID WC   escitalopram   5 mg Oral Daily   gabapentin   200 mg Oral TID   Gerhardt's butt cream   Topical QID   lidocaine   2 patch Transdermal Q24H   potassium chloride   20 mEq Oral Daily   sodium chloride  flush  3 mL Intravenous Q12H   Continuous Infusions:  Nutritional status     Body mass index is 49.11 kg/m.  Data Reviewed:   CBC: Recent Labs  Lab 04/14/24 0312  04/17/24 0352  WBC 7.8 9.2  NEUTROABS 4.4 5.7  HGB 8.0* 8.6*  HCT 27.9* 29.5*  MCV 87.2 87.5  PLT 295 346   Basic Metabolic Panel: Recent Labs  Lab 04/14/24 0312 04/17/24 0352  NA 138 141  K 3.5 3.6  CL 102 103  CO2 34* 34*  GLUCOSE 72 71  BUN 8 12  CREATININE 0.41* 0.49  CALCIUM  7.5* 7.5*   GFR: Estimated Creatinine Clearance: 90.6 mL/min (by C-G formula based on SCr of 0.49 mg/dL). Liver Function Tests: No results for input(s): AST, ALT, ALKPHOS, BILITOT, PROT, ALBUMIN  in the last 168 hours. No results for input(s): LIPASE, AMYLASE in the last 168 hours. No results for input(s): AMMONIA in the last 168 hours. Coagulation Profile: No results for input(s): INR, PROTIME in the last 168 hours. Cardiac Enzymes: No results for input(s): CKTOTAL, CKMB, CKMBINDEX, TROPONINI in the last 168 hours. BNP (last 3 results) Recent Labs  04/04/24 1600  PROBNP 3,741.0*   HbA1C: No results for input(s): HGBA1C in the last 72 hours. CBG: No results for input(s): GLUCAP in the last 168 hours. Lipid Profile: No results for input(s): CHOL, HDL, LDLCALC, TRIG, CHOLHDL, LDLDIRECT in the last 72 hours. Thyroid  Function Tests: No results for input(s): TSH, T4TOTAL, FREET4, T3FREE, THYROIDAB in the last 72 hours. Anemia Panel: No results for input(s): VITAMINB12, FOLATE, FERRITIN, TIBC, IRON, RETICCTPCT in the last 72 hours. Sepsis Labs: No results for input(s): PROCALCITON, LATICACIDVEN in the last 168 hours.  No results found for this or any previous visit (from the past 240 hours).       Radiology Studies: No results found.         LOS: 14 days   Time spent= 35 mins    Burgess JAYSON Dare, MD [facility]  If 7PM-7AM, please contact night-coverage  04/18/2024, 10:38 AM

## 2024-04-18 NOTE — Plan of Care (Signed)
  Problem: Education: Goal: Knowledge of General Education information will improve Description: Including pain rating scale, medication(s)/side effects and non-pharmacologic comfort measures Outcome: Progressing   Problem: Health Behavior/Discharge Planning: Goal: Ability to manage health-related needs will improve Outcome: Progressing   Problem: Clinical Measurements: Goal: Will remain free from infection Outcome: Not Progressing   

## 2024-04-25 ENCOUNTER — Telehealth: Payer: Self-pay

## 2024-04-25 NOTE — Telephone Encounter (Signed)
 Clinic provided with provider portion of MULTAQ  PAP to complete. Patient portion has been mailed to patient to complete and return.

## 2024-06-08 NOTE — Progress Notes (Unsigned)
" °  Cardiology Office Note   Date:  06/08/2024  ID:  Kristine Montgomery, Kristine Montgomery 1965-07-03, MRN [id number] PCP: Cathe Warren BRAVO, NP  [facility] Providers Cardiologist:  Vina Gull, MD Electrophysiologist:  Danelle Birmingham, MD    History of Present Illness Kristine Montgomery is a 59 y.o. female with a past medical history of recent vfib arrest in the setting of hypokalemia, CAD, HFimpEF, pAF, obestiy. Patient presents today for a hospital follow up appointment   Patient had NSTEMI in 07/2020. Echo at that time showed EF 30%. Cath showed severe multivessel CAD. Underwent successful two-vessel coronary intervention with DES to the prox LAD, DES to the prox Lcx. Repeat echocardiogram in 11/2020 showed EF improved to 55-60%.   In 03/2021, patient was admitted with afib with RVR. Converted to NSR on amiodarone . Later transitioned to multaq .   Echocardiogram in 07/2022 showed EF 55-60%, grade I DD, normal RV systolic function, no significant valvular abnormalities   Patient admitted 11/19-12/3/25 after she had a cardiac arrest. Presented with severe nausea, vomiting, and diarrhea. Found to have elevated lactic acid at 3.7. While in the ED, she had an episode of VT that progressed to V-fib and required CPR for 1 minute, defib x1. She was found to be hypokalemic with K of 2.1. She underwent echocardiogram that showed EF 50-55%, no wall motion abnormalities, normal RV size and function, small pericardial effusion, mild MR. Discharged on lipitor , multaq , eliquis , farxiga , lasix .   Vfib arrest  - Patient had vfib arrest in the ED on 11/19 in the setting of hypokalemia. Had 1 min CPR and 1 defib  - Echocardiogram showed EF 50-55%, no wall motion abnormalities, normal RV size and function, small pericardial effusion, mild MR - Ordered BMP to recheck potassium. Ordered mag as well   CAD  - s/p stents to the prox LAD and prox LCX in 07/2020  -  - continue lipitor  80 mg daily - Not on ASA due to eliquis  use    Chronic HFpEF Mild MR - Echo in 03/2024 showed EF 50-55%, no wall motion abnormalities, normal RV size and function, small pericardial effusion, mild MR -  - Continue farxiga  10 mg daily - Continue lsaix 20 mg daily  - K 3.6, creatinine 0.49 in 04/2024. Ordered BMP today   PAF  -  - Continue eliquis  5 mg Bid  - Continue multaq  400 mg BID  ROS: ***  Studies Reviewed      *** Risk Assessment/Calculations {Does this patient have ATRIAL FIBRILLATION?:[id number]} No BP recorded.  {Refresh Note OR Click here to enter BP  :1}***       Physical Exam VS:  LMP 06/27/2017        Wt Readings from Last 3 Encounters:  04/18/24 259 lb 14.4 oz (117.9 kg)  02/29/24 267 lb (121.1 kg)  11/05/23 286 lb 2.5 oz (129.8 kg)    GEN: Well nourished, well developed in no acute distress NECK: No JVD; No carotid bruits CARDIAC: ***RRR, no murmurs, rubs, gallops RESPIRATORY:  Clear to auscultation without rales, wheezing or rhonchi  ABDOMEN: Soft, non-tender, non-distended EXTREMITIES:  No edema; No deformity   ASSESSMENT AND PLAN ***    {Are you ordering a CV Procedure (e.g. stress test, cath, DCCV, TEE, etc)?   Press F2        :[id number]}  Dispo: ***  Signed, Rollo FABIENE Louder, PA-C   "

## 2024-06-20 ENCOUNTER — Ambulatory Visit: Admitting: Cardiology

## 2024-06-29 ENCOUNTER — Ambulatory Visit: Admitting: Student in an Organized Health Care Education/Training Program

## 2024-08-21 ENCOUNTER — Ambulatory Visit: Admitting: Cardiology
